# Patient Record
Sex: Female | Born: 1965 | Race: White | Hispanic: No | Marital: Single | Smoking: Never smoker
Health system: Southern US, Community
[De-identification: ages and names within clinical notes are randomized; demographics above are authoritative.]

## PROBLEM LIST (undated history)

## (undated) DIAGNOSIS — E88819 Insulin resistance, unspecified: Secondary | ICD-10-CM

## (undated) DIAGNOSIS — K635 Polyp of colon: Secondary | ICD-10-CM

## (undated) DIAGNOSIS — S83249A Other tear of medial meniscus, current injury, unspecified knee, initial encounter: Secondary | ICD-10-CM

## (undated) DIAGNOSIS — E785 Hyperlipidemia, unspecified: Secondary | ICD-10-CM

## (undated) DIAGNOSIS — Z923 Personal history of irradiation: Secondary | ICD-10-CM

## (undated) DIAGNOSIS — R011 Cardiac murmur, unspecified: Secondary | ICD-10-CM

## (undated) DIAGNOSIS — Z803 Family history of malignant neoplasm of breast: Secondary | ICD-10-CM

## (undated) DIAGNOSIS — K219 Gastro-esophageal reflux disease without esophagitis: Secondary | ICD-10-CM

## (undated) DIAGNOSIS — E669 Obesity, unspecified: Secondary | ICD-10-CM

## (undated) DIAGNOSIS — F329 Major depressive disorder, single episode, unspecified: Secondary | ICD-10-CM

## (undated) DIAGNOSIS — M5137 Other intervertebral disc degeneration, lumbosacral region: Secondary | ICD-10-CM

## (undated) DIAGNOSIS — J309 Allergic rhinitis, unspecified: Secondary | ICD-10-CM

## (undated) DIAGNOSIS — E8881 Metabolic syndrome: Secondary | ICD-10-CM

## (undated) DIAGNOSIS — M51379 Other intervertebral disc degeneration, lumbosacral region without mention of lumbar back pain or lower extremity pain: Secondary | ICD-10-CM

## (undated) DIAGNOSIS — K649 Unspecified hemorrhoids: Secondary | ICD-10-CM

## (undated) DIAGNOSIS — K760 Fatty (change of) liver, not elsewhere classified: Secondary | ICD-10-CM

## (undated) DIAGNOSIS — G4733 Obstructive sleep apnea (adult) (pediatric): Secondary | ICD-10-CM

## (undated) DIAGNOSIS — F419 Anxiety disorder, unspecified: Secondary | ICD-10-CM

## (undated) DIAGNOSIS — D649 Anemia, unspecified: Secondary | ICD-10-CM

## (undated) DIAGNOSIS — I10 Essential (primary) hypertension: Secondary | ICD-10-CM

## (undated) HISTORY — DX: Polyp of colon: K63.5

## (undated) HISTORY — DX: Obstructive sleep apnea (adult) (pediatric): G47.33

## (undated) HISTORY — DX: Metabolic syndrome: E88.81

## (undated) HISTORY — DX: Obesity, unspecified: E66.9

## (undated) HISTORY — DX: Major depressive disorder, single episode, unspecified: F32.9

## (undated) HISTORY — PX: KNEE SURGERY: SHX244

## (undated) HISTORY — DX: Other tear of medial meniscus, current injury, unspecified knee, initial encounter: S83.249A

## (undated) HISTORY — DX: Hyperlipidemia, unspecified: E78.5

## (undated) HISTORY — DX: Anxiety disorder, unspecified: F41.9

## (undated) HISTORY — PX: LAPAROSCOPY: SHX197

## (undated) HISTORY — DX: Cardiac murmur, unspecified: R01.1

## (undated) HISTORY — DX: Insulin resistance, unspecified: E88.819

## (undated) HISTORY — DX: Essential (primary) hypertension: I10

## (undated) HISTORY — DX: Gastro-esophageal reflux disease without esophagitis: K21.9

## (undated) HISTORY — DX: Family history of malignant neoplasm of breast: Z80.3

## (undated) HISTORY — DX: Unspecified hemorrhoids: K64.9

## (undated) HISTORY — PX: OTHER SURGICAL HISTORY: SHX169

## (undated) HISTORY — DX: Allergic rhinitis, unspecified: J30.9

---

## 1984-06-29 HISTORY — PX: CHOLECYSTECTOMY: SHX55

## 1998-06-29 HISTORY — PX: EYE SURGERY: SHX253

## 1998-06-29 HISTORY — PX: LASIK: SHX215

## 1998-07-26 ENCOUNTER — Other Ambulatory Visit: Admission: RE | Admit: 1998-07-26 | Discharge: 1998-07-26 | Payer: Self-pay | Admitting: Obstetrics and Gynecology

## 1998-10-11 ENCOUNTER — Other Ambulatory Visit: Admission: RE | Admit: 1998-10-11 | Discharge: 1998-10-11 | Payer: Self-pay | Admitting: Obstetrics and Gynecology

## 1999-02-24 ENCOUNTER — Other Ambulatory Visit: Admission: RE | Admit: 1999-02-24 | Discharge: 1999-02-24 | Payer: Self-pay | Admitting: Obstetrics and Gynecology

## 1999-04-08 ENCOUNTER — Ambulatory Visit (HOSPITAL_COMMUNITY): Admission: RE | Admit: 1999-04-08 | Discharge: 1999-04-08 | Payer: Self-pay | Admitting: Gastroenterology

## 1999-08-12 ENCOUNTER — Other Ambulatory Visit: Admission: RE | Admit: 1999-08-12 | Discharge: 1999-08-12 | Payer: Self-pay | Admitting: Obstetrics and Gynecology

## 1999-09-08 ENCOUNTER — Encounter: Admission: RE | Admit: 1999-09-08 | Discharge: 1999-09-08 | Payer: Self-pay | Admitting: Thoracic Surgery

## 1999-09-08 ENCOUNTER — Encounter: Payer: Self-pay | Admitting: Thoracic Surgery

## 1999-12-16 ENCOUNTER — Ambulatory Visit (HOSPITAL_COMMUNITY): Admission: RE | Admit: 1999-12-16 | Discharge: 1999-12-16 | Payer: Self-pay | Admitting: Obstetrics and Gynecology

## 2000-09-02 ENCOUNTER — Other Ambulatory Visit: Admission: RE | Admit: 2000-09-02 | Discharge: 2000-09-02 | Payer: Self-pay | Admitting: Obstetrics and Gynecology

## 2001-06-29 HISTORY — PX: FINGER GANGLION CYST EXCISION: SHX1636

## 2001-09-06 ENCOUNTER — Other Ambulatory Visit: Admission: RE | Admit: 2001-09-06 | Discharge: 2001-09-06 | Payer: Self-pay | Admitting: Obstetrics and Gynecology

## 2002-09-14 ENCOUNTER — Other Ambulatory Visit: Admission: RE | Admit: 2002-09-14 | Discharge: 2002-09-14 | Payer: Self-pay | Admitting: Obstetrics and Gynecology

## 2002-12-12 ENCOUNTER — Encounter: Payer: Self-pay | Admitting: Obstetrics and Gynecology

## 2002-12-12 ENCOUNTER — Ambulatory Visit (HOSPITAL_COMMUNITY): Admission: RE | Admit: 2002-12-12 | Discharge: 2002-12-12 | Payer: Self-pay | Admitting: Obstetrics and Gynecology

## 2003-11-07 ENCOUNTER — Other Ambulatory Visit: Admission: RE | Admit: 2003-11-07 | Discharge: 2003-11-07 | Payer: Self-pay | Admitting: Obstetrics and Gynecology

## 2004-06-04 ENCOUNTER — Ambulatory Visit: Payer: Self-pay | Admitting: Critical Care Medicine

## 2004-06-24 ENCOUNTER — Encounter: Admission: RE | Admit: 2004-06-24 | Discharge: 2004-06-24 | Payer: Self-pay | Admitting: Gastroenterology

## 2004-06-29 DIAGNOSIS — F419 Anxiety disorder, unspecified: Secondary | ICD-10-CM

## 2004-06-29 DIAGNOSIS — F32A Depression, unspecified: Secondary | ICD-10-CM

## 2004-06-29 HISTORY — DX: Depression, unspecified: F32.A

## 2004-06-29 HISTORY — DX: Anxiety disorder, unspecified: F41.9

## 2004-10-17 ENCOUNTER — Encounter: Admission: RE | Admit: 2004-10-17 | Discharge: 2004-10-17 | Payer: Self-pay | Admitting: Family Medicine

## 2005-01-14 ENCOUNTER — Ambulatory Visit: Payer: Self-pay | Admitting: Critical Care Medicine

## 2005-01-22 ENCOUNTER — Ambulatory Visit: Payer: Self-pay | Admitting: Internal Medicine

## 2005-02-11 ENCOUNTER — Other Ambulatory Visit: Admission: RE | Admit: 2005-02-11 | Discharge: 2005-02-11 | Payer: Self-pay | Admitting: Obstetrics and Gynecology

## 2005-02-19 ENCOUNTER — Ambulatory Visit (HOSPITAL_COMMUNITY): Admission: RE | Admit: 2005-02-19 | Discharge: 2005-02-19 | Payer: Self-pay | Admitting: Obstetrics and Gynecology

## 2005-06-29 DIAGNOSIS — K635 Polyp of colon: Secondary | ICD-10-CM

## 2005-06-29 HISTORY — DX: Polyp of colon: K63.5

## 2005-09-03 ENCOUNTER — Ambulatory Visit: Payer: Self-pay | Admitting: Critical Care Medicine

## 2005-09-18 ENCOUNTER — Ambulatory Visit: Payer: Self-pay | Admitting: Critical Care Medicine

## 2006-02-15 ENCOUNTER — Other Ambulatory Visit: Admission: RE | Admit: 2006-02-15 | Discharge: 2006-02-15 | Payer: Self-pay | Admitting: Obstetrics and Gynecology

## 2006-03-08 ENCOUNTER — Ambulatory Visit (HOSPITAL_COMMUNITY): Admission: RE | Admit: 2006-03-08 | Discharge: 2006-03-08 | Payer: Self-pay | Admitting: Obstetrics and Gynecology

## 2006-03-16 ENCOUNTER — Ambulatory Visit: Payer: Self-pay | Admitting: Pulmonary Disease

## 2006-04-23 ENCOUNTER — Ambulatory Visit (HOSPITAL_BASED_OUTPATIENT_CLINIC_OR_DEPARTMENT_OTHER): Admission: RE | Admit: 2006-04-23 | Discharge: 2006-04-23 | Payer: Self-pay | Admitting: Pulmonary Disease

## 2006-05-06 ENCOUNTER — Ambulatory Visit: Payer: Self-pay | Admitting: Pulmonary Disease

## 2006-06-02 ENCOUNTER — Ambulatory Visit: Payer: Self-pay | Admitting: Pulmonary Disease

## 2006-07-08 ENCOUNTER — Ambulatory Visit: Payer: Self-pay | Admitting: Pulmonary Disease

## 2006-12-30 ENCOUNTER — Ambulatory Visit: Payer: Self-pay | Admitting: Critical Care Medicine

## 2007-03-21 ENCOUNTER — Ambulatory Visit (HOSPITAL_COMMUNITY): Admission: RE | Admit: 2007-03-21 | Discharge: 2007-03-21 | Payer: Self-pay | Admitting: Obstetrics and Gynecology

## 2008-03-26 ENCOUNTER — Ambulatory Visit (HOSPITAL_COMMUNITY): Admission: RE | Admit: 2008-03-26 | Discharge: 2008-03-26 | Payer: Self-pay | Admitting: Obstetrics and Gynecology

## 2009-04-05 ENCOUNTER — Ambulatory Visit (HOSPITAL_COMMUNITY): Admission: RE | Admit: 2009-04-05 | Discharge: 2009-04-05 | Payer: Self-pay | Admitting: Obstetrics and Gynecology

## 2009-11-13 ENCOUNTER — Ambulatory Visit: Payer: Self-pay | Admitting: Critical Care Medicine

## 2009-11-13 DIAGNOSIS — I10 Essential (primary) hypertension: Secondary | ICD-10-CM | POA: Insufficient documentation

## 2009-11-13 DIAGNOSIS — J4520 Mild intermittent asthma, uncomplicated: Secondary | ICD-10-CM | POA: Insufficient documentation

## 2009-11-13 DIAGNOSIS — N809 Endometriosis, unspecified: Secondary | ICD-10-CM | POA: Insufficient documentation

## 2009-11-13 DIAGNOSIS — E785 Hyperlipidemia, unspecified: Secondary | ICD-10-CM | POA: Insufficient documentation

## 2009-11-13 DIAGNOSIS — F411 Generalized anxiety disorder: Secondary | ICD-10-CM | POA: Insufficient documentation

## 2009-11-13 DIAGNOSIS — J4540 Moderate persistent asthma, uncomplicated: Secondary | ICD-10-CM | POA: Insufficient documentation

## 2009-11-13 DIAGNOSIS — G4733 Obstructive sleep apnea (adult) (pediatric): Secondary | ICD-10-CM | POA: Insufficient documentation

## 2009-11-13 DIAGNOSIS — K219 Gastro-esophageal reflux disease without esophagitis: Secondary | ICD-10-CM | POA: Insufficient documentation

## 2009-11-13 DIAGNOSIS — E8881 Metabolic syndrome: Secondary | ICD-10-CM | POA: Insufficient documentation

## 2010-03-17 ENCOUNTER — Ambulatory Visit: Payer: Self-pay | Admitting: Critical Care Medicine

## 2010-03-17 DIAGNOSIS — Z6841 Body Mass Index (BMI) 40.0 and over, adult: Secondary | ICD-10-CM

## 2010-04-23 ENCOUNTER — Ambulatory Visit (HOSPITAL_COMMUNITY): Admission: RE | Admit: 2010-04-23 | Discharge: 2010-04-23 | Payer: Self-pay | Admitting: Obstetrics and Gynecology

## 2010-07-31 NOTE — Assessment & Plan Note (Signed)
Summary: Pulmonary OV   Primary Provider/Referring Provider:  Rita Ohara  CC:  4 month follow up.  Pt states she does have SOB when exercising -- this is slightly better since last OV and believes the increase in advair is helping.  Dry cough.  Denies wheezing and chest tightness.  Requesting flu vac today.Marland Kitchen  History of Present Illness: Pulmonary OV   March 17, 2010 3:14 PM Since last ov doing well other than continued weight gain.  Pt notes no wheeze and dyspnea Pt notes no chest pain .  No rescue inhaler use Pt denies any significant sore throat, nasal congestion or excess secretions, fever, chills, sweats, unintended weight loss, pleurtic or exertional chest pain, orthopnea PND, or leg swelling Pt denies any increase in rescue therapy over baseline, denies waking up needing it or having any early am or nocturnal exacerbations of coughing/wheezing/or dyspnea.   Asthma History    Asthma Control Assessment:    Age range: 12+ years    Symptoms: 0-2 days/week    Nighttime Awakenings: 0-2/month    Interferes w/ normal activity: no limitations    SABA use (not for EIB): 0-2 days/week    ATAQ questionnaire: 0    FEV1: 2.46 liters (today)    FEV1 Pred: 2.90 liters (today)    Exacerbations requiring oral systemic steroids: 0-1/year    Asthma Control Assessment: Well Controlled   Preventive Screening-Counseling & Management  Alcohol-Tobacco     Smoking Status: never  Current Medications (verified): 1)  Ibuprofen 200 Mg Tabs (Ibuprofen) .... Once Daily 2)  Zyrtec Allergy 10 Mg Tabs (Cetirizine Hcl) .... Take 1 Tablet By Mouth Once A Day 3)  Provera 10 Mg Tabs (Medroxyprogesterone Acetate) .... 2 Tabs Once Daily 4)  Singulair 10 Mg Tabs (Montelukast Sodium) .... Take 1 Tablet By Mouth Once A Day 5)  Flonase 50 Mcg/act Susp (Fluticasone Propionate) .... Two Times A Day 6)  Advair Diskus 250-50 Mcg/dose  Misc (Fluticasone-Salmeterol) .... One Puff Twice Daily 7)  Proventil Hfa 108  (90 Base) Mcg/act Aers (Albuterol Sulfate) .... As Needed 8)  Metoprolol Tartrate 50 Mg Tabs (Metoprolol Tartrate) .... Take 1 Tablet By Mouth Two Times A Day 9)  Hydrochlorothiazide 25 Mg Tabs (Hydrochlorothiazide) .... Take 1 Tablet By Mouth Once A Day 10)  Pravastatin Sodium 80 Mg Tabs (Pravastatin Sodium) .... Take 1 Tablet By Mouth Once A Day 11)  Citalopram Hydrobromide 40 Mg Tabs (Citalopram Hydrobromide) .... Take 1 Tablet By Mouth Once A Day 12)  Metformin Hcl 500 Mg Tabs (Metformin Hcl) .... Take 2 Tablets Qam and 1 Tablet Qpm. 13)  Multivitamins  Tabs (Multiple Vitamin) .... Take 1 Tablet By Mouth Once A Day 14)  Vitamin B-12 100 Mcg Tabs (Cyanocobalamin) .... Take 1 Tablet By Mouth Once A Day 15)  Calcium-Vitamin D 250-125 Mg-Unit Tabs (Calcium Carbonate-Vitamin D) .... 2 Tabs Once Daily 16)  Fish Oil 1000 Mg Caps (Omega-3 Fatty Acids) .... 3 Once Daily 17)  Prilosec 20 Mg Cpdr (Omeprazole) .... Take 1 Tablet By Mouth Once A Day 18)  Diclofenac Potassium 50 Mg Tabs (Diclofenac Potassium) .... As Needed  Allergies (verified): No Known Drug Allergies  Past History:  Past medical, surgical, family and social histories (including risk factors) reviewed, and no changes noted (except as noted below).  Past Medical History: Reviewed history from 11/13/2009 and no changes required. Current Problems:  GERD (ICD-530.81) INSULIN RESISTANCE SYNDROME (ICD-259.8) ANXIETY (ICD-300.00) HYPERLIPIDEMIA (ICD-272.4) HYPERTENSION (ICD-401.9) ASTHMA (ICD-493.90) ENDOMETRIOSIS (ICD-617.9) Obesity Sleep Apnea  Past Surgical History: Reviewed history from 11/13/2009 and no changes required. right and left knee surgery right hand surgery  Family History: Reviewed history from 11/13/2009 and no changes required. allergies-father, siblings asthma-father HTN-mother, father  Social History: Reviewed history from 11/13/2009 and no changes required. Patient never smoked.  alcohol 2 x  weekly single no children Medical Transcription  Review of Systems  The patient denies shortness of breath with activity, shortness of breath at rest, productive cough, non-productive cough, coughing up blood, chest pain, irregular heartbeats, acid heartburn, indigestion, loss of appetite, weight change, abdominal pain, difficulty swallowing, sore throat, tooth/dental problems, headaches, nasal congestion/difficulty breathing through nose, sneezing, itching, ear ache, anxiety, depression, hand/feet swelling, joint stiffness or pain, rash, change in color of mucus, and fever.    Vital Signs:  Patient profile:   45 year old female Height:      64 inches Weight:      275.50 pounds BMI:     47.46 O2 Sat:      96 % on Room air Temp:     98.1 degrees F oral Pulse rate:   75 / minute BP sitting:   132 / 68  (left arm) Cuff size:   large  Vitals Entered By: Raymondo Band RN (March 17, 2010 3:06 PM)  O2 Flow:  Room air CC: 4 month follow up.  Pt states she does have SOB when exercising -- this is slightly better since last OV and believes the increase in advair is helping.  Dry cough.  Denies wheezing and chest tightness.  Requesting flu vac today. Comments Medications reviewed with patient Daytime contact number verified with patient. Raymondo Band RN  March 17, 2010 3:07 PM    Physical Exam  Additional Exam:  Gen: Pleasant, obese , in no distress,  normal affect ENT: No lesions,  mouth clear,  oropharynx clear, no postnasal drip Neck: No JVD, no TMG, no carotid bruits Lungs: No use of accessory muscles, no dullness to percussion, clear without rales or rhonchi, distant BS Cardiovascular: RRR, heart sounds normal, no murmur or gallops, no peripheral edema Abdomen: soft and NT, no HSM,  BS normal Musculoskeletal: No deformities, no cyanosis or clubbing Neuro: alert, non focal Skin: Warm, no lesions or rashes    Pre-Spirometry FEV1    Value: 2.46 L     Pred: 2.90 L       Impression & Recommendations:  Problem # 1:  OBESITY, UNSPECIFIED (ICD-278.00) Assessment Deteriorated ongoing weight gain plan weight loss program recommended  Problem # 2:  ASTHMA (ICD-493.90) Assessment: Unchanged  Moderate persistent asthma stable at this time plan No change in inhaled medications.   Maintain treatment program as currently prescribed. flu vaccine   Medications Added to Medication List This Visit: 1)  Zyrtec Allergy 10 Mg Tabs (Cetirizine hcl) .... Take 1 tablet by mouth once a day 2)  Fish Oil 1000 Mg Caps (Omega-3 fatty acids) .... 3 once daily 3)  Diclofenac Potassium 50 Mg Tabs (Diclofenac potassium) .... As needed  Complete Medication List: 1)  Ibuprofen 200 Mg Tabs (Ibuprofen) .... Once daily 2)  Zyrtec Allergy 10 Mg Tabs (Cetirizine hcl) .... Take 1 tablet by mouth once a day 3)  Provera 10 Mg Tabs (Medroxyprogesterone acetate) .... 2 tabs once daily 4)  Singulair 10 Mg Tabs (Montelukast sodium) .... Take 1 tablet by mouth once a day 5)  Flonase 50 Mcg/act Susp (Fluticasone propionate) .... Two times a day 6)  Advair Diskus 250-50 Mcg/dose Misc (Fluticasone-salmeterol) .Marland KitchenMarland KitchenMarland Kitchen  One puff twice daily 7)  Proventil Hfa 108 (90 Base) Mcg/act Aers (Albuterol sulfate) .... As needed 8)  Metoprolol Tartrate 50 Mg Tabs (Metoprolol tartrate) .... Take 1 tablet by mouth two times a day 9)  Hydrochlorothiazide 25 Mg Tabs (Hydrochlorothiazide) .... Take 1 tablet by mouth once a day 10)  Pravastatin Sodium 80 Mg Tabs (Pravastatin sodium) .... Take 1 tablet by mouth once a day 11)  Citalopram Hydrobromide 40 Mg Tabs (Citalopram hydrobromide) .... Take 1 tablet by mouth once a day 12)  Metformin Hcl 500 Mg Tabs (Metformin hcl) .... Take 2 tablets qam and 1 tablet qpm. 13)  Multivitamins Tabs (Multiple vitamin) .... Take 1 tablet by mouth once a day 14)  Vitamin B-12 100 Mcg Tabs (Cyanocobalamin) .... Take 1 tablet by mouth once a day 15)  Calcium-vitamin D 250-125  Mg-unit Tabs (Calcium carbonate-vitamin d) .... 2 tabs once daily 16)  Fish Oil 1000 Mg Caps (Omega-3 fatty acids) .... 3 once daily 17)  Prilosec 20 Mg Cpdr (Omeprazole) .... Take 1 tablet by mouth once a day 18)  Diclofenac Potassium 50 Mg Tabs (Diclofenac potassium) .... As needed  Other Orders: Est. Patient Level III (56387) Admin 1st Vaccine (56433) Flu Vaccine 31yr + ((29518  Patient Instructions: 1)  A Flu Vaccine will be given 2)  No change in medications 3)  Focus on weight loss 4)  Return 4-5 months     Flu Vaccine Consent Questions     Do you have a history of severe allergic reactions to this vaccine? no    Any prior history of allergic reactions to egg and/or gelatin? no    Do you have a sensitivity to the preservative Thimersol? no    Do you have a past history of Guillan-Barre Syndrome? no    Do you currently have an acute febrile illness? no    Have you ever had a severe reaction to latex? no    Vaccine information given and explained to patient? yes    Are you currently pregnant? no    Lot Number:[id number]   Exp Date:12/26/2009   Site Given  Left Deltoid IMlu CRaymondo BandRN  March 17, 2010 3:26 PM  Appended Document: Pulmonary OV fax eve knapp

## 2010-07-31 NOTE — Assessment & Plan Note (Signed)
Summary: Pulmonary OV   Primary Provider/Referring Provider:  Rita Ohara  CC:  Follow up.  Last seen 12/2006.  Pt states she is having increased SOB with activity x6 months.  Denies wheezing, chest tightness, and cough.  .  History of Present Illness: Pulmonary OV  Nov 13, 2009 2:09 PM Not seen since 2008.  Pt with hx of moderate persistent asthma.  Pt has gained weight.  Eating poorly.  Now if tries to run or high cardiac activity is more dyspneic. Notes wheeze with every exercise effort. Notes no cough.   Pt is still on cpap for osa. No chest pain.    Main issue is inability to excercise.  Maintains advair 100 one puff two times a day Pt denies any significant sore throat, nasal congestion or excess secretions, fever, chills, sweats, unintended weight loss, pleurtic or exertional chest pain, orthopnea PND, or leg swelling Pt denies any increase in rescue therapy over baseline, denies waking up needing it or having any early am or nocturnal exacerbations of coughing/wheezing/or dyspnea. Pt also denies any obvious fluctuation in symptoms wiht weather or environmental change or other alleviating or aggravating factors     Asthma History    Initial Asthma Severity Rating:    Age range: 12+ years    Symptoms: >2 days/week; not daily    Nighttime Awakenings: 0-2/month    Interferes w/ normal activity: some limitations    SABA use (not for EIB): 0-2 days/week    Exacerbations requiring oral systemic steroids: 0-1/year    Asthma Severity Assessment: Moderate Persistent   Preventive Screening-Counseling & Management  Alcohol-Tobacco     Smoking Status: never  Current Medications (verified): 1)  Ibuprofen 200 Mg Tabs (Ibuprofen) .... Once Daily 2)  Allegra 180 Mg Tabs (Fexofenadine Hcl) .... Once Daily 3)  Provera 10 Mg Tabs (Medroxyprogesterone Acetate) .... 2 Tabs Once Daily 4)  Singulair 10 Mg Tabs (Montelukast Sodium) .... Take 1 Tablet By Mouth Once A Day 5)  Flonase 50  Mcg/act Susp (Fluticasone Propionate) .... Two Times A Day 6)  Advair Diskus 100-50 Mcg/dose Aepb (Fluticasone-Salmeterol) .... Inhale 1 Puff Two Times A Day 7)  Proventil Hfa 108 (90 Base) Mcg/act Aers (Albuterol Sulfate) .... As Needed 8)  Metoprolol Tartrate 50 Mg Tabs (Metoprolol Tartrate) .... Take 1 Tablet By Mouth Two Times A Day 9)  Hydrochlorothiazide 25 Mg Tabs (Hydrochlorothiazide) .... Take 1 Tablet By Mouth Once A Day 10)  Pravastatin Sodium 80 Mg Tabs (Pravastatin Sodium) .... Take 1 Tablet By Mouth Once A Day 11)  Citalopram Hydrobromide 40 Mg Tabs (Citalopram Hydrobromide) .... Take 1 Tablet By Mouth Once A Day 12)  Metformin Hcl 500 Mg Tabs (Metformin Hcl) .... Take 2 Tablets Qam and 1 Tablet Qpm. 13)  Multivitamins  Tabs (Multiple Vitamin) .... Take 1 Tablet By Mouth Once A Day 14)  Vitamin B-12 100 Mcg Tabs (Cyanocobalamin) .... Take 1 Tablet By Mouth Once A Day 15)  Calcium-Vitamin D 250-125 Mg-Unit Tabs (Calcium Carbonate-Vitamin D) .... 2 Tabs Once Daily 16)  Fish Oil 1200 Mg Caps (Omega-3 Fatty Acids) .... 2 Caps Once Daily 17)  Prilosec 20 Mg Cpdr (Omeprazole) .... Take 1 Tablet By Mouth Once A Day  Allergies (verified): No Known Drug Allergies  Past History:  Past medical, surgical, family and social histories (including risk factors) reviewed, and no changes noted (except as noted below).  Past Medical History: Current Problems:  GERD (ICD-530.81) INSULIN RESISTANCE SYNDROME (ICD-259.8) ANXIETY (ICD-300.00) HYPERLIPIDEMIA (ICD-272.4) HYPERTENSION (ICD-401.9) ASTHMA (  ICD-493.90) ENDOMETRIOSIS (ICD-617.9) Obesity Sleep Apnea  Past Surgical History: right and left knee surgery right hand surgery  Family History: Reviewed history and no changes required. allergies-father, siblings asthma-father HTN-mother, father  Social History: Reviewed history and no changes required. Patient never smoked.  alcohol 2 x weekly single no children Medical  Transcription Smoking Status:  never  Review of Systems       The patient complains of shortness of breath with activity, non-productive cough, and weight change.  The patient denies shortness of breath at rest, productive cough, coughing up blood, chest pain, irregular heartbeats, acid heartburn, indigestion, loss of appetite, abdominal pain, difficulty swallowing, sore throat, tooth/dental problems, headaches, nasal congestion/difficulty breathing through nose, sneezing, itching, ear ache, anxiety, depression, hand/feet swelling, joint stiffness or pain, rash, change in color of mucus, and fever.    Vital Signs:  Patient profile:   45 year old female Height:      64 inches Weight:      264 pounds BMI:     45.48 O2 Sat:      97 % on Room air Temp:     98.7 degrees F oral Pulse rate:   66 / minute BP sitting:   130 / 76  (right arm) Cuff size:   large  Vitals Entered By: Raymondo Band RN (Nov 13, 2009 2:04 PM)  O2 Flow:  Room air CC: Follow up.  Last seen 12/2006.  Pt states she is having increased SOB with activity x6 months.  Denies wheezing, chest tightness, cough.   Comments Medications reviewed with patient Daytime contact number verified with patient. Raymondo Band RN  Nov 13, 2009 2:04 PM    Physical Exam  Additional Exam:  Gen: Pleasant, obese , in no distress,  normal affect ENT: No lesions,  mouth clear,  oropharynx clear, no postnasal drip Neck: No JVD, no TMG, no carotid bruits Lungs: No use of accessory muscles, no dullness to percussion, clear without rales or rhonchi, distant BS Cardiovascular: RRR, heart sounds normal, no murmur or gallops, no peripheral edema Abdomen: soft and NT, no HSM,  BS normal Musculoskeletal: No deformities, no cyanosis or clubbing Neuro: alert, non focal Skin: Warm, no lesions or rashes    Pulmonary Function Test Date: 11/13/2009 2:25 PM Gender: Female  Pre-Spirometry FVC    Value: 3.60 L/min   % Pred: 99.90 % FEV1    Value:  2.46 L     Pred: 2.90 L     % Pred: 84.90 % FEV1/FVC  Value: 68.38 %     % Pred: 84.20 %  Impression & Recommendations:  Problem # 1:  ASTHMA (ICD-493.90) Assessment Deteriorated Moderate persistent asthma with mild flare, spirometry with peripheral airflow obstruction noted. Obesity is an issue  plan increase advair to 250 one puff two times a day weight loss program as needed SABA  Medications Added to Medication List This Visit: 1)  Ibuprofen 200 Mg Tabs (Ibuprofen) .... Once daily 2)  Allegra 180 Mg Tabs (Fexofenadine hcl) .... Once daily 3)  Provera 10 Mg Tabs (Medroxyprogesterone acetate) .... 2 tabs once daily 4)  Singulair 10 Mg Tabs (Montelukast sodium) .... Take 1 tablet by mouth once a day 5)  Flonase 50 Mcg/act Susp (Fluticasone propionate) .... Two times a day 6)  Advair Diskus 100-50 Mcg/dose Aepb (Fluticasone-salmeterol) .... Inhale 1 puff two times a day 7)  Advair Diskus 250-50 Mcg/dose Misc (Fluticasone-salmeterol) .... One puff twice daily 8)  Proventil Hfa 108 (90 Base) Mcg/act Aers (Albuterol  sulfate) .... As needed 9)  Metoprolol Tartrate 50 Mg Tabs (Metoprolol tartrate) .... Take 1 tablet by mouth two times a day 10)  Hydrochlorothiazide 25 Mg Tabs (Hydrochlorothiazide) .... Take 1 tablet by mouth once a day 11)  Pravastatin Sodium 80 Mg Tabs (Pravastatin sodium) .... Take 1 tablet by mouth once a day 12)  Citalopram Hydrobromide 40 Mg Tabs (Citalopram hydrobromide) .... Take 1 tablet by mouth once a day 13)  Metformin Hcl 500 Mg Tabs (Metformin hcl) .... Take 2 tablets qam and 1 tablet qpm. 14)  Multivitamins Tabs (Multiple vitamin) .... Take 1 tablet by mouth once a day 15)  Vitamin B-12 100 Mcg Tabs (Cyanocobalamin) .... Take 1 tablet by mouth once a day 16)  Calcium-vitamin D 250-125 Mg-unit Tabs (Calcium carbonate-vitamin d) .... 2 tabs once daily 17)  Fish Oil 1200 Mg Caps (Omega-3 fatty acids) .... 2 caps once daily 18)  Prilosec 20 Mg Cpdr (Omeprazole)  .... Take 1 tablet by mouth once a day  Complete Medication List: 1)  Ibuprofen 200 Mg Tabs (Ibuprofen) .... Once daily 2)  Allegra 180 Mg Tabs (Fexofenadine hcl) .... Once daily 3)  Provera 10 Mg Tabs (Medroxyprogesterone acetate) .... 2 tabs once daily 4)  Singulair 10 Mg Tabs (Montelukast sodium) .... Take 1 tablet by mouth once a day 5)  Flonase 50 Mcg/act Susp (Fluticasone propionate) .... Two times a day 6)  Advair Diskus 250-50 Mcg/dose Misc (Fluticasone-salmeterol) .... One puff twice daily 7)  Proventil Hfa 108 (90 Base) Mcg/act Aers (Albuterol sulfate) .... As needed 8)  Metoprolol Tartrate 50 Mg Tabs (Metoprolol tartrate) .... Take 1 tablet by mouth two times a day 9)  Hydrochlorothiazide 25 Mg Tabs (Hydrochlorothiazide) .... Take 1 tablet by mouth once a day 10)  Pravastatin Sodium 80 Mg Tabs (Pravastatin sodium) .... Take 1 tablet by mouth once a day 11)  Citalopram Hydrobromide 40 Mg Tabs (Citalopram hydrobromide) .... Take 1 tablet by mouth once a day 12)  Metformin Hcl 500 Mg Tabs (Metformin hcl) .... Take 2 tablets qam and 1 tablet qpm. 13)  Multivitamins Tabs (Multiple vitamin) .... Take 1 tablet by mouth once a day 14)  Vitamin B-12 100 Mcg Tabs (Cyanocobalamin) .... Take 1 tablet by mouth once a day 15)  Calcium-vitamin D 250-125 Mg-unit Tabs (Calcium carbonate-vitamin d) .... 2 tabs once daily 16)  Fish Oil 1200 Mg Caps (Omega-3 fatty acids) .... 2 caps once daily 17)  Prilosec 20 Mg Cpdr (Omeprazole) .... Take 1 tablet by mouth once a day  Other Orders: Spirometry w/Graph (94010) Est. Patient Level V (06269)  Patient Instructions: 1)  Increase advair to 250 two puff twice daily 2)  Follow weight loss program 3)  Use Proventil as needed and before exercise  4)  Return 4 months Prescriptions: ADVAIR DISKUS 250-50 MCG/DOSE  MISC (FLUTICASONE-SALMETEROL) One puff twice daily  #1 x 6   Entered and Authorized by:   Elsie Stain MD   Signed by:   Elsie Stain  MD on 11/13/2009   Method used:   Electronically to        [facility]. [phone]* (retail)       [address]       [place], [place]  [address]       Ph: [phone] or [phone]       Fax: [phone]   RxID:   [id number]    Immunization History:  Influenza Immunization History:    Influenza:  historical (03/29/2009)    CardioPerfect Spirometry  ID: [id number] Patient: Susan Gould, Susan Gould DOB: October 25, 1965 Age: 45 Years Old Sex: Female Race: White Height: 64 Weight: 264 Status: Confirmed Recorded: 11/13/2009 2:25 PM  Parameter  Measured Predicted %Predicted FVC     3.60        3.60        99.90 FEV1     2.46        2.90        84.90 FEV1%   68.38        81.25        84.20 PEF    5.78        6.84        84.40   Comments: Moderate peripheral airflow obstruction  Interpretation: Pre: FVC= 3.60L FEV1= 2.46L FEV1%= 68.4% 2.46/3.60 FEV1/FVC (11/13/2009 2:27:24 PM), Mild obstruction    Appended Document: Pulmonary OV fax eve knapp

## 2010-08-05 ENCOUNTER — Ambulatory Visit (INDEPENDENT_AMBULATORY_CARE_PROVIDER_SITE_OTHER): Payer: BC Managed Care – PPO | Admitting: Critical Care Medicine

## 2010-08-05 ENCOUNTER — Encounter: Payer: Self-pay | Admitting: Critical Care Medicine

## 2010-08-05 DIAGNOSIS — J45909 Unspecified asthma, uncomplicated: Secondary | ICD-10-CM

## 2010-08-05 DIAGNOSIS — E669 Obesity, unspecified: Secondary | ICD-10-CM

## 2010-08-14 NOTE — Assessment & Plan Note (Signed)
Summary: Pulmonary OV   Primary Provider/Referring Provider:  Rita Ohara  CC:  4 month follow up, pt c/o am cough non-prod, denies sob, and stopped singulair 4 months ago due to cost..  History of Present Illness: Pulmonary OV   March 17, 2010 3:14 PM Since last ov doing well other than continued weight gain.  Pt notes no wheeze and dyspnea Pt notes no chest pain .  No rescue inhaler use Pt denies any significant sore throat, nasal congestion or excess secretions, fever, chills, sweats, unintended weight loss, pleurtic or exertional chest pain, orthopnea PND, or leg swelling Pt denies any increase in rescue therapy over baseline, denies waking up needing it or having any early am or nocturnal exacerbations of coughing/wheezing/or dyspnea.   August 05, 2010 11:41 AM Since last ov,  no real change no new issues Pt denies any significant sore throat, nasal congestion or excess secretions, fever, chills, sweats, unintended weight loss, pleurtic or exertional chest pain, orthopnea PND, or leg swelling Pt denies any increase in rescue therapy over baseline, denies waking up needing it or having any early am or nocturnal exacerbations of coughing/wheezing/or dyspnea.   Asthma History    Asthma Control Assessment:    Age range: 12+ years    Symptoms: 0-2 days/week    Nighttime Awakenings: 0-2/month    Interferes w/ normal activity: no limitations    SABA use (not for EIB): 0-2 days/week    ATAQ questionnaire: 0    FEV1: 2.46 liters (today)    FEV1 Pred: 2.90 liters (today)    Exacerbations requiring oral systemic steroids: 0-1/year    Asthma Control Assessment: Well Controlled   Preventive Screening-Counseling & Management  Alcohol-Tobacco     Smoking Status: never  Current Medications (verified): 1)  Ibuprofen 200 Mg Tabs (Ibuprofen) .... Once Daily 2)  Zyrtec Allergy 10 Mg Tabs (Cetirizine Hcl) .... Take 1 Tablet By Mouth Once A Day 3)  Provera 10 Mg Tabs  (Medroxyprogesterone Acetate) .... 2 Tabs Once Daily 4)  Flonase 50 Mcg/act Susp (Fluticasone Propionate) .... Two Times A Day 5)  Advair Diskus 250-50 Mcg/dose  Misc (Fluticasone-Salmeterol) .... One Puff Twice Daily 6)  Proventil Hfa 108 (90 Base) Mcg/act Aers (Albuterol Sulfate) .... As Needed 7)  Metoprolol Tartrate 50 Mg Tabs (Metoprolol Tartrate) .... Take 1 Tablet By Mouth Two Times A Day 8)  Hydrochlorothiazide 25 Mg Tabs (Hydrochlorothiazide) .... Take 1 Tablet By Mouth Once A Day 9)  Pravastatin Sodium 80 Mg Tabs (Pravastatin Sodium) .... Take 1 Tablet By Mouth Once A Day 10)  Citalopram Hydrobromide 40 Mg Tabs (Citalopram Hydrobromide) .... Take 1 Tablet By Mouth Once A Day 11)  Metformin Hcl 500 Mg Tabs (Metformin Hcl) .... Take 2 Tablets Qam and 1 Tablet Qpm. 12)  Multivitamins  Tabs (Multiple Vitamin) .... Take 1 Tablet By Mouth Once A Day 13)  Vitamin B-12 100 Mcg Tabs (Cyanocobalamin) .... Take 1 Tablet By Mouth Once A Day 14)  Calcium-Vitamin D 250-125 Mg-Unit Tabs (Calcium Carbonate-Vitamin D) .... 2 Tabs Once Daily 15)  Fish Oil 1000 Mg Caps (Omega-3 Fatty Acids) .... 3 Once Daily 16)  Prilosec 20 Mg Cpdr (Omeprazole) .... Take 1 Tablet By Mouth Once A Day 17)  Diclofenac Potassium 50 Mg Tabs (Diclofenac Potassium) .... As Needed  Allergies (verified): No Known Drug Allergies  Past History:  Past medical, surgical, family and social histories (including risk factors) reviewed, and no changes noted (except as noted below).  Past Medical History: Reviewed  history from 11/13/2009 and no changes required. Current Problems:  GERD (ICD-530.81) INSULIN RESISTANCE SYNDROME (ICD-259.8) ANXIETY (ICD-300.00) HYPERLIPIDEMIA (ICD-272.4) HYPERTENSION (ICD-401.9) ASTHMA (ICD-493.90) ENDOMETRIOSIS (ICD-617.9) Obesity Sleep Apnea  Past Surgical History: Reviewed history from 11/13/2009 and no changes required. right and left knee surgery right hand surgery  Family  History: Reviewed history from 11/13/2009 and no changes required. allergies-father, siblings asthma-father HTN-mother, father  Social History: Reviewed history from 11/13/2009 and no changes required. Patient never smoked.  alcohol 2 x weekly single no children Medical Transcription  Review of Systems  The patient denies shortness of breath with activity, shortness of breath at rest, productive cough, non-productive cough, coughing up blood, chest pain, irregular heartbeats, acid heartburn, indigestion, loss of appetite, weight change, abdominal pain, difficulty swallowing, sore throat, tooth/dental problems, headaches, nasal congestion/difficulty breathing through nose, sneezing, itching, ear ache, anxiety, depression, hand/feet swelling, joint stiffness or pain, rash, change in color of mucus, and fever.    Vital Signs:  Patient profile:   45 year old female Height:      64 inches Weight:      264 pounds BMI:     45.48 O2 Sat:      97 % on Room air Temp:     98.9 degrees F oral Pulse rate:   62 / minute BP sitting:   114 / 70  (left arm) Cuff size:   large  Vitals Entered By: Stefani Dama RCP, LPN (August 05, 8113 11:30 AM)  Nutrition Counseling: Patient's BMI is greater than 25 and therefore counseled on weight management options.  O2 Flow:  Room air CC: 4 month follow up, pt c/o am cough non-prod, denies sob, stopped singulair 4 months ago due to cost. Comments Medications reviewed with patient Stefani Dama RCP, LPN  August 05, 7260 11:33 AM    Physical Exam  Additional Exam:  Gen: Pleasant, obese , in no distress,  normal affect ENT: No lesions,  mouth clear,  oropharynx clear, no postnasal drip Neck: No JVD, no TMG, no carotid bruits Lungs: No use of accessory muscles, no dullness to percussion, clear without rales or rhonchi, distant BS Cardiovascular: RRR, heart sounds normal, no murmur or gallops, no peripheral edema Abdomen: soft and NT, no HSM,  BS  normal Musculoskeletal: No deformities, no cyanosis or clubbing Neuro: alert, non focal Skin: Warm, no lesions or rashes    Pre-Spirometry FEV1    Value: 2.46 L     Pred: 2.90 L     Impression & Recommendations:  Problem # 1:  ASTHMA (ICD-493.90) Assessment Improved  Moderate persistent asthma stable at this time plan No change in inhaled medications.   Maintain treatment program as currently prescribed.  Medications Added to Medication List This Visit: 1)  Fluticasone Propionate 50 Mcg/act Susp (Fluticasone propionate) .... One -two puffs once daily  Complete Medication List: 1)  Ibuprofen 200 Mg Tabs (Ibuprofen) .... Once daily 2)  Zyrtec Allergy 10 Mg Tabs (Cetirizine hcl) .... Take 1 tablet by mouth once a day 3)  Provera 10 Mg Tabs (Medroxyprogesterone acetate) .... 2 tabs once daily 4)  Fluticasone Propionate 50 Mcg/act Susp (Fluticasone propionate) .... One -two puffs once daily 5)  Advair Diskus 250-50 Mcg/dose Misc (Fluticasone-salmeterol) .... One puff twice daily 6)  Proventil Hfa 108 (90 Base) Mcg/act Aers (Albuterol sulfate) .... As needed 7)  Metoprolol Tartrate 50 Mg Tabs (Metoprolol tartrate) .... Take 1 tablet by mouth two times a day 8)  Hydrochlorothiazide 25 Mg Tabs (Hydrochlorothiazide) .Marland KitchenMarland KitchenMarland Kitchen  Take 1 tablet by mouth once a day 9)  Pravastatin Sodium 80 Mg Tabs (Pravastatin sodium) .... Take 1 tablet by mouth once a day 10)  Citalopram Hydrobromide 40 Mg Tabs (Citalopram hydrobromide) .... Take 1 tablet by mouth once a day 11)  Metformin Hcl 500 Mg Tabs (Metformin hcl) .... Take 2 tablets qam and 1 tablet qpm. 12)  Multivitamins Tabs (Multiple vitamin) .... Take 1 tablet by mouth once a day 13)  Vitamin B-12 100 Mcg Tabs (Cyanocobalamin) .... Take 1 tablet by mouth once a day 14)  Calcium-vitamin D 250-125 Mg-unit Tabs (Calcium carbonate-vitamin d) .... 2 tabs once daily 15)  Fish Oil 1000 Mg Caps (Omega-3 fatty acids) .... 3 once daily 16)  Prilosec 20 Mg Cpdr  (Omeprazole) .... Take 1 tablet by mouth once a day 17)  Diclofenac Potassium 50 Mg Tabs (Diclofenac potassium) .... As needed  Other Orders: Est. Patient Level III (62831)  Patient Instructions: 1)  No change in medications 2)  Return in     5     months Prescriptions: FLUTICASONE PROPIONATE 50 MCG/ACT SUSP (FLUTICASONE PROPIONATE) one -two puffs once daily  #1 x 6   Entered and Authorized by:   Elsie Stain MD   Signed by:   Elsie Stain MD on 08/05/2010   Method used:   Electronically to        [facility]. [phone]* (retail)       [address]       [place], [place]  [address]       Ph: [phone] or [phone]       Fax: [phone]   RxID:   [id number] ADVAIR DISKUS 250-50 MCG/DOSE  MISC (FLUTICASONE-SALMETEROL) One puff twice daily  #1 x 6   Entered and Authorized by:   Elsie Stain MD   Signed by:   Elsie Stain MD on 08/05/2010   Method used:   Electronically to        [facility]. [phone]* (retail)       [address]       [place], [place]  [address]       Ph: [phone] or [phone]       Fax: [phone]   RxID:   [id number]

## 2010-08-21 ENCOUNTER — Ambulatory Visit (INDEPENDENT_AMBULATORY_CARE_PROVIDER_SITE_OTHER): Payer: BC Managed Care – PPO | Admitting: Family Medicine

## 2010-08-25 ENCOUNTER — Ambulatory Visit (INDEPENDENT_AMBULATORY_CARE_PROVIDER_SITE_OTHER): Payer: BC Managed Care – PPO

## 2010-08-25 DIAGNOSIS — Z79899 Other long term (current) drug therapy: Secondary | ICD-10-CM

## 2010-08-25 DIAGNOSIS — E119 Type 2 diabetes mellitus without complications: Secondary | ICD-10-CM

## 2010-08-25 DIAGNOSIS — E7800 Pure hypercholesterolemia, unspecified: Secondary | ICD-10-CM

## 2010-08-25 DIAGNOSIS — I10 Essential (primary) hypertension: Secondary | ICD-10-CM

## 2010-10-20 ENCOUNTER — Ambulatory Visit (INDEPENDENT_AMBULATORY_CARE_PROVIDER_SITE_OTHER): Payer: BC Managed Care – PPO | Admitting: Family Medicine

## 2010-10-20 DIAGNOSIS — I10 Essential (primary) hypertension: Secondary | ICD-10-CM

## 2010-10-20 DIAGNOSIS — E782 Mixed hyperlipidemia: Secondary | ICD-10-CM

## 2010-10-20 DIAGNOSIS — M7710 Lateral epicondylitis, unspecified elbow: Secondary | ICD-10-CM

## 2010-11-10 ENCOUNTER — Telehealth: Payer: Self-pay | Admitting: Family Medicine

## 2010-11-10 MED ORDER — METFORMIN HCL 500 MG PO TABS: 500.0000 mg | ORAL_TABLET | Freq: Two times a day (BID) | ORAL | Status: DC

## 2010-11-10 NOTE — Telephone Encounter (Signed)
Refilled metformin 550m #90 w/2rf's take 2qam and 1qpm to [facility]

## 2010-11-11 NOTE — Assessment & Plan Note (Signed)
[facility]                             PULMONARY OFFICE NOTE   QUETZALY, EBNER                   MRN:          [id number]  DATE:12/30/2006                            DOB:          [birth year]    REASON FOR VISIT:  The patient returns today for follow up.   HISTORY:  The patient is doing well with decreased shortness of breath  and decreased cough.  She now has been diagnosed with severe obstructive  sleep apnea and is on CPAP therapy.  She is maintained on Advair 100/51  spray twice a day, Flonase daily, singular 10 mg daily, and albuterol as  needed.   PHYSICAL EXAMINATION:  VITAL SIGNS:  On exam temp is 98, blood pressure  136/88, pulse 71 and saturation 98% on room air.  CHEST:  The chest shows distant breath sounds with no evidence of wheeze  or rhonchi.  HEART:  Cardiac exam shows a regular rate and rhythm without S3.  Normal  S1 and S2.  ABDOMEN:  The abdomen is soft and nontender.  EXTREMITIES:  The extremities show no edema, clubbing or venous disease.  NEUROLOGIC:  The neurological exam is intact.   IMPRESSION:  Stable extrinsic asthma.   PLAN:  1. The plan is to maintain her on Advair as currently dosed and as      needed albuterol.  2. We will see the patient back in five months.     Burnett Harry Joya Gaskins, MD, [facility]  Electronically Signed    PEW/MedQ  DD: 12/30/2006  DT: 12/31/2006  Job #: [id number]   cc:   Vikki Ports, M.D.

## 2010-11-14 NOTE — Procedures (Signed)
NAME:  ASIYAH, PINEAU NO.:  [id number]   MEDICAL RECORD NO.:  [id number]          PATIENT TYPE:  OUT   LOCATION:  SLEEP CENTER                 FACILITY:  [facility]   PHYSICIAN:  Kathee Delton, MD,FCCPDATE OF BIRTH:  May 27[birth year] 1964   DATE OF STUDY:  04/23/2006                              NOCTURNAL POLYSOMNOGRAM    REFERRING PHYSICIAN:  Dr. Danton Sewer   INDICATION FOR STUDY:  Hypersomnia with sleep apnea.   EPWORTH SLEEPINESS SCORE:  10   SLEEP ARCHITECTURE:  The patient had total sleep time 305 minutes with  decreased slow wave sleep as well as REM.  Sleep onset latency is prolonged  at 64 minutes as was REM onset at 213 minutes.  Sleep efficiency decreased  at 77%.   RESPIRATORY DATA:  The patient was found to have 128 hypotonias and 85  obstruction apneas for a respiratory disturbance index of 42 events per  hour.  The events were not positional and snoring was noted throughout but  not quantified by the sleep technician.   OXYGEN DATA:  There was oxygen desaturation as low as 89% with the patient's  obstructive events.   CARDIAC DATA:  No clinically significant cardiac arrhythmias.   MOVEMENT/PARASOMNIA:  The patient was found to have 53 leg jerks with less  than one per hour resulting in arousal or awakening.   IMPRESSION/RECOMMENDATIONS:  Severe obstructive sleep apnea/hypotonia  syndrome with a respiratory disturbance index of 42 events per hour and  oxygen saturation as low as 89%.  Treatment for this degree of sleep apnea  should focus primarily on weight loss as well as CPAP.           ______________________________  Kathee Delton, MD,FCCP  Diplomate, American Board of Sleep  Medicine     K[facility]/MEDQ  D:  05/04/2006 15:03:31  T:  05/05/2006 07:58:50  Job:  [id number]

## 2010-11-14 NOTE — Op Note (Signed)
[facility]  Patient:    Susan Gould, Susan Gould                   MRN: [id number] Proc. Date: 12/16/99 Adm. Date:  [id number] Attending:  Kendall Flack Pearline                           Operative Report  PREOPERATIVE DIAGNOSES:       1. Pelvic pain.                               2. Endometriosis.  POSTOPERATIVE DIAGNOSES:      1. Pelvic pain.                               2. Endometriosis.                               3. Right ovarian follicular cyst.  OPERATION:                    Operative laparoscopy, aspiration of right ovarian cyst, ablation of endometriosis.  ANESTHESIA:                   General orotracheal.  ESTIMATED BLOOD LOSS:         Less than 10 cc.  COMPLICATIONS:                None.  FINDINGS:                     The uterus and tubes were within normal limits. The left ovary was within normal limits without adhesions or stigmata of endometriosis.  The right ovary was enlarged with a 4 cm simple cyst containing clear yellow fluid.  There were no stigmata of endometriosis and no adhesions.  The posterior cul-de-sac contained endometriotic lesions that were powder burn in nature between the uterosacral ligaments and along the right uterosacral ligament.  There was diminution of the left uterosacral ligament. The appendix appeared normal as did the liver edge.  There were, however, powder burn lesions in the right peritoneal surface of the anterior abdominal wall near the area of the appendix.  PROCEDURE:                    The patient was taken to the operating room after appropriate identification and placed on the operating table.  After the obtainment of adequate general anesthesia she was placed in the modified lithotomy position.  The abdomen, perineum, and vagina were prepped with multiple layers of Betadine and a Foley catheter inserted into the bladder and connected to straight drainage.  A single tooth tenaculum was  placed on the anterior cervix.  The abdomen was draped as a sterile field.  The subumbilical area was infiltrated with 5 cc of 0.25% Marcaine as was the suprapubic region to the left of midline.  A subumbilical incision was made and Veress cannula placed through that incision into the peritoneal cavity.  A pneumoperitoneum was created with 4 L of CO2.  The Veress cannula was removed and the laparoscopic Trocar placed through that incision into the peritoneal cavity. The laparoscope was placed through the Trocar sleeve.  A second suprapubic incision was made to the left  of midline and the laparoscopic probe Trocar placed through that incision into the peritoneal cavity under direct visualization.  The above noted findings were made and documented and the harmonic scalpel used to ablate the areas of endometriosis along the posterior cul-de-sac between the uterosacral ligaments and along the right uterosacral ligaments.  It was also used to ablate the endometriosis on the anterior abdominal peritoneum near the appendix.  Hemostasis was noted to be adequate. There was also noted to be powder burn lesions along the sigmoid colon serosa. Copious irrigation was carried out and approximately 100 cc of warm lactated Ringers left in the pelvis.  A needle aspirator was used to aspirate approximately 20 cc of clear yellow fluid from the right ovarian cyst which then collapsed.  All instruments were then removed from the peritoneal cavity under direct visualization as the CO2 was allowed to escape.  ______ suprapubic incisions were closed with subcuticular sutures of 3-0 Vicryl. Sterile dressings were applied.  The single tooth tenaculum and Foley removed and the patient awakened from general anesthesia, then taken to the recovery room in satisfactory condition having tolerated the procedure well with sponge and instrument counts correct. DD:  12/16/99 TD:  12/17/99 Job: [id number]

## 2010-11-14 NOTE — Assessment & Plan Note (Signed)
Susan Gould                               PULMONARY OFFICE NOTE   Susan Gould, Susan Gould                   MRN:          [id number]  DATE:03/16/2006                            DOB:          07-28-1965    HISTORY OF PRESENT ILLNESS:  The patient is a 45 year old female whom I have  been asked to see for possible obstructive sleep apnea. The patient states  that she has been told she has mild snoring, but no one has ever mentioned  pauses in her breathing during sleep.  She has been noted to have choking  arousals.  She typically goes to bed between 10 and 11 and gets up at 6:30  to start her day.  She feels that she is not rested upon arising.  She notes  significant inappropriate daytime sleepiness with periods of inactivity  while sitting at her computer or doing mundane tasks.  She can easily doze  in the evening while watching TV and has some degree of sleepiness with  driving long distances.  Of note the patient's weight is up about 20 pounds  over the last few years.   PAST MEDICAL HISTORY:  Is significant for:  1. Hypertension.  2. History of asthma.  3. History of dyslipidemia.  4. History of allergic rhinitis.  5. Status post cholecystectomy in 1986.  6. History of endometriosis.  7. History of knee surgeries times 2.   CURRENT MEDICATIONS:  1. Birth control daily.  2. Advair 100/50 b.i.d.  3. Flonase daily.  4. Singulair 10 mg daily.  5. Metoprolol 25 mg b.i.d.  6. Zyrtec 10 mg daily.  7. Celexa 20 mg daily.  8. Prilosec OTC q.o.d.  9. Multiple vitamins over the counter that she takes.   Patient has no known drug allergies.   SOCIAL HISTORY:  She is single and had never smoked.   FAMILY HISTORY:  Is remarkable for her father having had asthma, otherwise  noncontributory.   REVIEW OF SYSTEMS:  See history of present illness.  Also patient more  documented in the chart.   PHYSICAL EXAMINATION:  GENERAL:  She is a obese  female in no acute distress.  Blood pressure 130/82.  Pulse is 52.  Temperature is 98.8.  She is 5 feet 4  inches tall weighs 229 pounds.  O2 saturation on room air is 96%.  HEENT:  Pupils equal, round and reactive to light and accommodation.  Extraocular muscles are intact.  Nares shows mild septal deviation to the  left.  Oropharynx with mild tonsillar hypertrophy and palate elongation.  NECK:  Is supple without JVD or lymphadenopathy or palpable organomegaly.  CHEST:  Is totally clear.  CARDIAC:  Reveals regular rate and rhythm.  No murmurs, rubs or gallops.  ABDOMEN:  Soft, nontender.  Good bowel sounds.  General exam, rectal exam:  Breast exam was not done and not indicated.  LOWER EXTREMITIES:  Are without edema.  Good pulses distally.  No calf  tenderness.  NEUROLOGIC:  Alert and oriented with no obvious motor deficits.   IMPRESSION:  Probable obstructive sleep apnea.  The patient is overweight and  has mild abnormal airway anatomy.  She gives a classic history for this and  does need further evaluation.  She is agreeable to this.   PLAN:  1. Schedule for a nocturnal polysomnography.  2. Work on weight loss.            ______________________________  Kathee Delton, MD,FCCP      [facility]/MedQ  DD:  04/19/2006  DT:  04/19/2006  Job #:  [id number]   cc:   Eldred Manges, M.D.  Vikki Ports, M.D.

## 2010-12-01 ENCOUNTER — Other Ambulatory Visit: Payer: Self-pay | Admitting: *Deleted

## 2010-12-01 MED ORDER — METFORMIN HCL ER 500 MG PO TB24: 500.0000 mg | ORAL_TABLET | Freq: Two times a day (BID) | ORAL | Status: DC

## 2011-01-12 ENCOUNTER — Other Ambulatory Visit: Payer: Self-pay | Admitting: Family Medicine

## 2011-01-12 MED ORDER — CITALOPRAM HYDROBROMIDE 40 MG PO TABS: 40.0000 mg | ORAL_TABLET | Freq: Every day | ORAL | Status: DC

## 2011-01-19 ENCOUNTER — Encounter: Payer: Self-pay | Admitting: Family Medicine

## 2011-01-20 ENCOUNTER — Other Ambulatory Visit: Payer: BC Managed Care – PPO

## 2011-01-20 DIAGNOSIS — Z79899 Other long term (current) drug therapy: Secondary | ICD-10-CM

## 2011-01-20 DIAGNOSIS — E7800 Pure hypercholesterolemia, unspecified: Secondary | ICD-10-CM

## 2011-01-20 LAB — HEPATIC FUNCTION PANEL
Albumin: 4.3 g/dL (ref 3.5–5.2)
Alkaline Phosphatase: 80 U/L (ref 39–117)
Bilirubin, Direct: 0.1 mg/dL (ref 0.0–0.3)
Indirect Bilirubin: 0.2 mg/dL (ref 0.0–0.9)
Total Bilirubin: 0.3 mg/dL (ref 0.3–1.2)
Total Protein: 7.2 g/dL (ref 6.0–8.3)

## 2011-01-20 LAB — LIPID PANEL
Cholesterol: 166 mg/dL (ref 0–200)
LDL Cholesterol: 95 mg/dL (ref 0–99)
VLDL: 27 mg/dL (ref 0–40)

## 2011-01-26 ENCOUNTER — Encounter: Payer: Self-pay | Admitting: Family Medicine

## 2011-01-26 ENCOUNTER — Ambulatory Visit (INDEPENDENT_AMBULATORY_CARE_PROVIDER_SITE_OTHER): Payer: BC Managed Care – PPO | Admitting: Family Medicine

## 2011-01-26 VITALS — BP 130/68 | HR 84 | Ht 64.0 in | Wt 260.0 lb

## 2011-01-26 DIAGNOSIS — R5383 Other fatigue: Secondary | ICD-10-CM

## 2011-01-26 DIAGNOSIS — J45909 Unspecified asthma, uncomplicated: Secondary | ICD-10-CM

## 2011-01-26 DIAGNOSIS — E785 Hyperlipidemia, unspecified: Secondary | ICD-10-CM

## 2011-01-26 DIAGNOSIS — IMO0002 Reserved for concepts with insufficient information to code with codable children: Secondary | ICD-10-CM

## 2011-01-26 DIAGNOSIS — M7710 Lateral epicondylitis, unspecified elbow: Secondary | ICD-10-CM

## 2011-01-26 DIAGNOSIS — F329 Major depressive disorder, single episode, unspecified: Secondary | ICD-10-CM

## 2011-01-26 DIAGNOSIS — K219 Gastro-esophageal reflux disease without esophagitis: Secondary | ICD-10-CM

## 2011-01-26 DIAGNOSIS — E119 Type 2 diabetes mellitus without complications: Secondary | ICD-10-CM

## 2011-01-26 DIAGNOSIS — R5381 Other malaise: Secondary | ICD-10-CM

## 2011-01-26 DIAGNOSIS — E348 Other specified endocrine disorders: Secondary | ICD-10-CM

## 2011-01-26 DIAGNOSIS — I10 Essential (primary) hypertension: Secondary | ICD-10-CM

## 2011-01-26 LAB — POCT GLYCOSYLATED HEMOGLOBIN (HGB A1C): Hemoglobin A1C: 5.8

## 2011-01-26 MED ORDER — METFORMIN HCL ER 500 MG PO TB24: ORAL_TABLET | ORAL | Status: DC

## 2011-01-26 NOTE — Patient Instructions (Signed)
Call Susan Gould (or whomever is covered by your insurance, if she isn't) for counseling.  Continue all your current medications.  Try and watch your diet (eat healthy foods, portion control).  Return sooner than 6 months for any worsening problems (ie. Call for PT referral for elbow pain, worsening moods, side effects of meds, higher BP's at home, etc.)

## 2011-01-26 NOTE — Progress Notes (Signed)
Patient presents for follow-up.  We changed her from Pravastatin to Lipitor at last visit.  Seems to be tolerating without side effects.  Had some itching and swelling around her eyes, but resolved (thinks possibly related to walnuts).   Had labs last week--normal liver tests, HDL improved to 44, ratio improved to <4 and LDL 95.  Hypertension follow-up:  Blood pressures elsewhere are 120's/60's.  Denies dizziness, headaches, chest pain.  Denies side effects of medications. Rare dizziness related to over-exertion at [employer], short-lived.  Occasional cough after meals, more likely related to reflux rather than ACEI.  Depression:  Increased stress related to aging parents (who are still living independently). Admits to being an emotional eater (eats both when stressed/upset and when happy, as a reward for a good day).  Has been feeling a little more down lately, hesitant to change her meds further.  Continues to suffer with some discomfort/pain at left elbow, previously diagnosed as medial and lateral epicondylitis.  Is on NSAID's (but only taking once daily and not twice) and uses a tennis elbow strap.  Seems to bother her at night, related to sleep position. Slightly improved from previously, not worsening.  Knee pain--saw Dr. Paula Compton OA and a bone spur.  She was told to continue with NSAIDs.  Pain is improved, tolerable.  Cortisone shot would be next step, not needing now.  Asthma--no complaints, stable.  Meds--see med list in chart. Past Medical History  Diagnosis Date  . Asthma     (Dr. Joya Gaskins)  . Obesity   . Hypertension   . GERD (gastroesophageal reflux disease)   . Allergic rhinitis, cause unspecified     cat/dog/ragweed/mold/wool, s/p immunotherapy x 3-4 yrs  . Hyperlipidemia   . OSA (obstructive sleep apnea)     on CPAP (Dr. Gwenette Greet)  . Depression 2006  . Anxiety 2006  . Colon polyps ?2001 vs 2007    Dr. Oletta Lamas  . Hemorrhoid     internal and external  . Endometriosis    Dr. Leo Grosser  . Insulin resistance     Dr. Leo Grosser   Past Surgical History  Procedure Date  . Cholecystectomy 1986  . Knee surgery '95 and '97    BILATERAL KNEE  . Finger ganglion cyst excision 2003    RIGHT 3rd finger  . Lasik 2000  . Laparoscopy '98, '01    endometriosis (Dr. Leo Grosser)  . Tibial tuberoplasty     left   No Known Allergies  ROS:  SEE HPI.  Denies fevers, URI symptoms, chest pain, shortness of breath, fevers, skin rash.  Dermatitis on scalp comes and goes, improves with steroid cream.  Joint pains--see above  PHYSICAL EXAM: BP 130/68  Pulse 84  Ht 5' 4"  (1.626 m)  Wt 260 lb (117.935 kg)  BMI 44.63 kg/m2 Well developed, pleasant, obese female in no distress Neck: no lymphadenopathy or thyromegaly Heart: regular rate and rhythm without murmur Lungs: clear bilaterally Abdomen: obese, soft, nontender, nondistended, no organomegaly or masses Extremities: no edema.  L elbow--mild tenderness medially and laterally.  Pain with supination against resistance. No pain with pronation or wrist flexion and extension against resistance. Skin: no rash Psych: full range of affect, smiling.  Normal hygiene, grooming, eye contact, speech.  ASSESSMENT/PLAN: 1. HYPERLIPIDEMIA  Comprehensive metabolic panel, Lipid panel  2. HYPERTENSION  Comprehensive metabolic panel  3. Type II or unspecified type diabetes mellitus without mention of complication, not stated as uncontrolled  POCT HgB A1C, metFORMIN (GLUCOPHAGE-XR) 500 MG 24 hr tablet,  Hemoglobin A1c  4. Epicondylitis syndrome of elbow     lateral>medial.  Rest; increase NSAIDs to BID for up to 2 weeks  5. ASTHMA    6. GERD    7. INSULIN RESISTANCE SYNDROME  Hemoglobin A1c  8. Depressive disorder, not elsewhere classified     Continue citalopram.  Counseling encouraged.  Yamel Whitt's number given  9. Other malaise and fatigue  Vitamin D 25 hydroxy, TSH, CBC with Differential   Epicondylitis--PT recommended if not continuing  to improve.  Patient will call for referral if desired  Recommended TdaP and pneumovax--declines today. Will consider for next visit in 6 months

## 2011-02-25 ENCOUNTER — Other Ambulatory Visit: Payer: Self-pay | Admitting: Family Medicine

## 2011-02-25 MED ORDER — DICLOFENAC SODIUM 75 MG PO TBEC: 75.0000 mg | DELAYED_RELEASE_TABLET | Freq: Two times a day (BID) | ORAL | Status: DC

## 2011-04-16 ENCOUNTER — Other Ambulatory Visit (HOSPITAL_COMMUNITY): Payer: Self-pay | Admitting: Obstetrics and Gynecology

## 2011-04-16 DIAGNOSIS — Z1231 Encounter for screening mammogram for malignant neoplasm of breast: Secondary | ICD-10-CM

## 2011-04-27 ENCOUNTER — Ambulatory Visit (HOSPITAL_COMMUNITY)
Admission: RE | Admit: 2011-04-27 | Discharge: 2011-04-27 | Disposition: A | Discharge status: Home / Self Care | Payer: BC Managed Care – PPO | Source: Ambulatory Visit | Attending: Obstetrics and Gynecology | Admitting: Obstetrics and Gynecology

## 2011-04-27 ENCOUNTER — Encounter: Payer: Self-pay | Admitting: Family Medicine

## 2011-04-27 DIAGNOSIS — Z1231 Encounter for screening mammogram for malignant neoplasm of breast: Secondary | ICD-10-CM | POA: Insufficient documentation

## 2011-05-26 ENCOUNTER — Telehealth: Payer: Self-pay | Admitting: Family Medicine

## 2011-05-27 ENCOUNTER — Other Ambulatory Visit: Payer: Self-pay | Admitting: *Deleted

## 2011-05-27 DIAGNOSIS — I10 Essential (primary) hypertension: Secondary | ICD-10-CM

## 2011-05-27 MED ORDER — LISINOPRIL-HYDROCHLOROTHIAZIDE 20-25 MG PO TABS: 1.0000 | ORAL_TABLET | Freq: Every day | ORAL | Status: DC

## 2011-05-29 NOTE — Telephone Encounter (Signed)
DONE

## 2011-06-19 ENCOUNTER — Telehealth: Payer: Self-pay | Admitting: Internal Medicine

## 2011-06-19 MED ORDER — ATORVASTATIN CALCIUM 40 MG PO TABS: 40.0000 mg | ORAL_TABLET | Freq: Every day | ORAL | Status: DC

## 2011-06-24 NOTE — Telephone Encounter (Signed)
done

## 2011-07-13 ENCOUNTER — Telehealth: Payer: Self-pay | Admitting: Family Medicine

## 2011-07-13 DIAGNOSIS — F329 Major depressive disorder, single episode, unspecified: Secondary | ICD-10-CM

## 2011-07-13 MED ORDER — CITALOPRAM HYDROBROMIDE 40 MG PO TABS: 40.0000 mg | ORAL_TABLET | Freq: Every day | ORAL | Status: DC

## 2011-07-13 NOTE — Telephone Encounter (Signed)
E-rxd

## 2011-07-23 ENCOUNTER — Encounter: Payer: Self-pay | Admitting: Internal Medicine

## 2011-07-27 ENCOUNTER — Other Ambulatory Visit: Payer: Self-pay | Admitting: Family Medicine

## 2011-07-27 ENCOUNTER — Other Ambulatory Visit: Payer: BC Managed Care – PPO

## 2011-07-27 ENCOUNTER — Telehealth: Payer: Self-pay | Admitting: Internal Medicine

## 2011-07-27 DIAGNOSIS — E119 Type 2 diabetes mellitus without complications: Secondary | ICD-10-CM

## 2011-07-27 DIAGNOSIS — E348 Other specified endocrine disorders: Secondary | ICD-10-CM

## 2011-07-27 DIAGNOSIS — R5381 Other malaise: Secondary | ICD-10-CM

## 2011-07-27 DIAGNOSIS — I10 Essential (primary) hypertension: Secondary | ICD-10-CM

## 2011-07-27 DIAGNOSIS — E785 Hyperlipidemia, unspecified: Secondary | ICD-10-CM

## 2011-07-27 LAB — COMPREHENSIVE METABOLIC PANEL
ALT: 22 U/L (ref 0–35)
AST: 27 U/L (ref 0–37)
Albumin: 4.3 g/dL (ref 3.5–5.2)
Alkaline Phosphatase: 76 U/L (ref 39–117)
BUN: 22 mg/dL (ref 6–23)
CO2: 22 mEq/L (ref 19–32)
Calcium: 9.5 mg/dL (ref 8.4–10.5)
Chloride: 102 mEq/L (ref 96–112)
Creat: 1.09 mg/dL (ref 0.50–1.10)
Glucose, Bld: 76 mg/dL (ref 70–99)
Potassium: 4.6 mEq/L (ref 3.5–5.3)
Sodium: 137 mEq/L (ref 135–145)
Total Bilirubin: 0.4 mg/dL (ref 0.3–1.2)
Total Protein: 7.1 g/dL (ref 6.0–8.3)

## 2011-07-27 LAB — CBC WITH DIFFERENTIAL/PLATELET
Basophils Absolute: 0.1 10*3/uL (ref 0.0–0.1)
Basophils Relative: 1 % (ref 0–1)
Eosinophils Relative: 8 % — ABNORMAL HIGH (ref 0–5)
Lymphocytes Relative: 26 % (ref 12–46)
MCHC: 32.2 g/dL (ref 30.0–36.0)
MCV: 92.2 fL (ref 78.0–100.0)
Monocytes Absolute: 0.6 10*3/uL (ref 0.1–1.0)
Neutro Abs: 5.4 10*3/uL (ref 1.7–7.7)
Platelets: 302 10*3/uL (ref 150–400)
RDW: 13.5 % (ref 11.5–15.5)
WBC: 9.2 10*3/uL (ref 4.0–10.5)

## 2011-07-27 LAB — LIPID PANEL
Cholesterol: 158 mg/dL (ref 0–200)
HDL: 42 mg/dL (ref >39)
LDL Cholesterol: 77 mg/dL (ref 0–99)
Total CHOL/HDL Ratio: 3.8 Ratio
Triglycerides: 195 mg/dL — ABNORMAL HIGH (ref <150)
VLDL: 39 mg/dL (ref 0–40)

## 2011-07-27 LAB — TSH: TSH: 1.591 u[IU]/mL (ref 0.350–4.500)

## 2011-07-27 MED ORDER — METFORMIN HCL ER 500 MG PO TB24: ORAL_TABLET | ORAL | Status: DC

## 2011-07-27 NOTE — Telephone Encounter (Signed)
Med sent in.

## 2011-07-27 NOTE — Telephone Encounter (Signed)
rx sent in 

## 2011-07-28 LAB — VITAMIN D 25 HYDROXY (VIT D DEFICIENCY, FRACTURES): Vit D, 25-Hydroxy: 44 ng/mL (ref 30–89)

## 2011-07-28 LAB — HEMOGLOBIN A1C: Mean Plasma Glucose: 120 mg/dL — ABNORMAL HIGH (ref <117)

## 2011-07-29 ENCOUNTER — Other Ambulatory Visit: Payer: Self-pay | Admitting: *Deleted

## 2011-07-29 ENCOUNTER — Encounter: Payer: Self-pay | Admitting: Family Medicine

## 2011-07-29 ENCOUNTER — Ambulatory Visit (INDEPENDENT_AMBULATORY_CARE_PROVIDER_SITE_OTHER): Payer: BC Managed Care – PPO | Admitting: Family Medicine

## 2011-07-29 VITALS — BP 122/70 | HR 84 | Ht 64.0 in | Wt 264.0 lb

## 2011-07-29 DIAGNOSIS — D649 Anemia, unspecified: Secondary | ICD-10-CM

## 2011-07-29 DIAGNOSIS — Z79899 Other long term (current) drug therapy: Secondary | ICD-10-CM

## 2011-07-29 DIAGNOSIS — Z23 Encounter for immunization: Secondary | ICD-10-CM

## 2011-07-29 DIAGNOSIS — M1710 Unilateral primary osteoarthritis, unspecified knee: Secondary | ICD-10-CM | POA: Insufficient documentation

## 2011-07-29 DIAGNOSIS — E348 Other specified endocrine disorders: Secondary | ICD-10-CM

## 2011-07-29 DIAGNOSIS — E119 Type 2 diabetes mellitus without complications: Secondary | ICD-10-CM

## 2011-07-29 DIAGNOSIS — E782 Mixed hyperlipidemia: Secondary | ICD-10-CM

## 2011-07-29 DIAGNOSIS — I10 Essential (primary) hypertension: Secondary | ICD-10-CM

## 2011-07-29 DIAGNOSIS — IMO0002 Reserved for concepts with insufficient information to code with codable children: Secondary | ICD-10-CM

## 2011-07-29 LAB — FOLATE: Folate: 16.8 ng/mL

## 2011-07-29 LAB — FERRITIN: Ferritin: 27 ng/mL (ref 10–291)

## 2011-07-29 MED ORDER — DICLOFENAC SODIUM 75 MG PO TBEC: 75.0000 mg | DELAYED_RELEASE_TABLET | Freq: Two times a day (BID) | ORAL | Status: DC

## 2011-07-29 MED ORDER — ATORVASTATIN CALCIUM 40 MG PO TABS: 40.0000 mg | ORAL_TABLET | Freq: Every day | ORAL | Status: DC

## 2011-07-29 NOTE — Progress Notes (Signed)
Chief complaint: 6 month med check, labs done 07/27/11. Needs refill of diclofenac. Mother diagnosed with breast ca 06/2011  HPI: HTN; Not checking BP elsewhere.  Denies headaches, dizziness, chest pain, palpitations or side effects from BP medications.  Compliant with meds.  Hyperlipidemia: compliant with medications, denies side effects.  Admits that her diet was poor during the holidays, but is doing better.  Poor diet over the last several days related to stress, eating more fried foods (related to her mother's health--just found out her breast cancer is in her lymph nodes).  Diabetes/insulin resistance:  Denies hypoglycemia, polydipsia or polyuria.  Last eye exam was 3-4 years ago. Checks feet regularly and has no skin concerns.  Anemia: Noted to be anemic on recent labs. Has a hemorrhoid, but hasn't been bleeding.  Has no vaginal bleeding or periods.  Denies weakness, dizziness or fatigue.  Review of chart shows 03/2010 Hgb 11.8, Hct 34.2.  Hasn't donated blood in over a year, prior to that was hit or miss if she was able to give (related to her hgb being low)  Past Medical History  Diagnosis Date  . Asthma     (Dr. Joya Gaskins)  . Obesity   . Hypertension   . GERD (gastroesophageal reflux disease)   . Allergic rhinitis, cause unspecified     cat/dog/ragweed/mold/wool, s/p immunotherapy x 3-4 yrs  . Hyperlipidemia   . OSA (obstructive sleep apnea)     on CPAP (Dr. Gwenette Greet)  . Depression 2006  . Anxiety 2006  . Colon polyps ?2001 vs 2007    Dr. Oletta Lamas  . Hemorrhoid     internal and external  . Endometriosis     Dr. Leo Grosser  . Insulin resistance     Dr. Leo Grosser  . Family history of breast cancer in female     Past Surgical History  Procedure Date  . Cholecystectomy 1986  . Knee surgery '95 and '97    BILATERAL KNEE  . Finger ganglion cyst excision 2003    RIGHT 3rd finger  . Lasik 2000  . Laparoscopy '98, '01    endometriosis (Dr. Leo Grosser)  . Tibial tuberoplasty     left      History   Social History  . Marital Status: Single    Spouse Name: N/A    Number of Children: N/A  . Years of Education: N/A   Occupational History  . Not on file.   Social History Main Topics  . Smoking status: Never Smoker   . Smokeless tobacco: Never Used  . Alcohol Use: Yes     6 drinks a week, beer  . Drug Use: No  . Sexually Active: Not on file   Other Topics Concern  . Not on file   Social History Narrative  . No narrative on file    Family History  Problem Relation Age of Onset  . Diabetes Mother   . Hypertension Mother   . Hyperlipidemia Mother   . Arthritis Mother   . Cancer Mother 38    breast cancer  . Diabetes Father   . Hyperlipidemia Father   . Hypertension Father   . Asthma Father   . Arthritis Father    Current Outpatient Prescriptions on File Prior to Visit  Medication Sig Dispense Refill  . Calcium Carbonate-Vitamin D (CALCIUM + D) 600-200 MG-UNIT TABS Take 2 tablets by mouth daily.        . cetirizine (ZYRTEC) 10 MG tablet Take 10 mg by mouth daily.        Marland Kitchen  citalopram (CELEXA) 40 MG tablet Take 1 tablet (40 mg total) by mouth daily.  30 tablet  5  . fish oil-omega-3 fatty acids 1000 MG capsule Take 3 g by mouth daily.       . fluticasone (FLONASE) 50 MCG/ACT nasal spray Place 2 sprays into the nose daily.        . Fluticasone-Salmeterol (ADVAIR DISKUS) 250-50 MCG/DOSE AEPB Inhale 1 puff into the lungs every 12 (twelve) hours.        Marland Kitchen lisinopril-hydrochlorothiazide (PRINZIDE,ZESTORETIC) 20-25 MG per tablet Take 1 tablet by mouth daily.  30 tablet  2  . medroxyPROGESTERone (PROVERA) 10 MG tablet Take 10 mg by mouth daily.       . metFORMIN (GLUCOPHAGE-XR) 500 MG 24 hr tablet Take 2 tablets in morning, 1 in evening  90 tablet  0  . Multiple Vitamins-Minerals (MULTIVITAMIN WITH MINERALS) tablet Take 1 tablet by mouth daily.        Marland Kitchen omeprazole (PRILOSEC) 20 MG capsule Take 20 mg by mouth daily.        . vitamin B-12 (CYANOCOBALAMIN) 1000  MCG tablet Take 1,000 mcg by mouth daily.        Marland Kitchen zinc gluconate 50 MG tablet Take 50 mg by mouth daily.        Marland Kitchen DISCONTD: atorvastatin (LIPITOR) 40 MG tablet Take 1 tablet (40 mg total) by mouth daily.  30 tablet  1  . DISCONTD: diclofenac (VOLTAREN) 75 MG EC tablet Take 1 tablet (75 mg total) by mouth 2 (two) times daily.  60 tablet  4  . cyclobenzaprine (FLEXERIL) 10 MG tablet Take 10 mg by mouth as needed.         No Known Allergies  ROS:  Had plantar fasciitis in the fall, saw Dr. Kern Reap, and is doing better now.  Has some L lateral foot pain since jumping more in boot camp last week, better today after icing it yesterday. Denies fevers, URI symptoms, shortness of breath.  Slight cough from reflux; denies wheezing. No chest pain, urinary symptoms or other concerns.  PHYSICAL EXAM: BP 122/70  Pulse 84  Ht 5' 4"  (1.626 m)  Wt 264 lb (119.75 kg)  BMI 45.32 kg/m2 Well developed, pleasant, obese female in no distress Neck: no lymphadenopathy, thyromegaly or carotid bruit Heart: regular rate and rhythm without murmur Lungs: clear bilaterally Abdomen: soft, obese, nontender, no organomegaly, mass or tenderness Extremities: no clubbing, cyanosis or edema. 2+ pulses.  Normal sensation.  Normal diabetic foot exam--see other section  Lab Results  Component Value Date   HGBA1C 5.8* 07/27/2011   Lab Results  Component Value Date   CHOL 158 07/27/2011   HDL 42 07/27/2011   LDLCALC 77 07/27/2011   TRIG 195* 07/27/2011   CHOLHDL 3.8 07/27/2011     Chemistry      Component Value Date/Time   NA 137 07/27/2011 0826   K 4.6 07/27/2011 0826   CL 102 07/27/2011 0826   CO2 22 07/27/2011 0826   BUN 22 07/27/2011 0826   CREATININE 1.09 07/27/2011 0826      Component Value Date/Time   CALCIUM 9.5 07/27/2011 0826   ALKPHOS 76 07/27/2011 0826   AST 27 07/27/2011 0826   ALT 22 07/27/2011 0826   BILITOT 0.4 07/27/2011 0826     Lab Results  Component Value Date   WBC 9.2 07/27/2011   HGB 11.1*  07/27/2011   HCT 34.5* 07/27/2011   MCV 92.2 07/27/2011   PLT 302 07/27/2011  ASSESSMENT/PLAN: 1. HYPERTENSION    2. Mixed hyperlipidemia  atorvastatin (LIPITOR) 40 MG tablet, Lipid panel  3. OA (osteoarthritis) of knee  diclofenac (VOLTAREN) 75 MG EC tablet  4. Need for Tdap vaccination  Tdap vaccine greater than or equal to 7yo IM  5. Type II or unspecified type diabetes mellitus without mention of complication, not stated as uncontrolled  Hemoglobin A1c, Comprehensive metabolic panel  6. Anemia, unspecified  CBC with Differential  7. Encounter for long-term (current) use of other medications  Comprehensive metabolic panel  8. INSULIN RESISTANCE SYNDROME     Anemia--add on b12, folate, iron and ferritin Encouraged continued exercise, diet--healthy diet and portion control, weight loss strongly encouraged. Lipids are at goal HTN is controlled  Schedule annual eye exam Call Dr. Oletta Lamas to check and see when due for next colonoscopy (if had adenomatous polyps in 2007, then past due)

## 2011-07-29 NOTE — Patient Instructions (Signed)
Schedule diabetic eye exam Call Dr. Oletta Lamas to check and see when due for next colonoscopy (if had adenomatous polyps in 2007, then past due)

## 2011-08-03 ENCOUNTER — Telehealth: Payer: Self-pay | Admitting: Family Medicine

## 2011-08-03 NOTE — Telephone Encounter (Signed)
Pt called and states she was reviewing her AVS and states we put that DX was uncontrolled hypertension with native Neph.   And she thinks this is wrong that her hypertension is controlled.  Please advise pt. [phone]

## 2011-08-03 NOTE — Telephone Encounter (Signed)
Looked in pt's chart and this diagnosis is not anywhere in chart, also pulled AVS from that date of service and it is not there either. Called patient and reassured her that there must have been some mistake but not to worry that this diagnosis does not appear anywhere in her chart. Pt verbalized understanding.

## 2011-08-03 NOTE — Telephone Encounter (Signed)
Review of note states we used dx of 401.9, which is unspecified hypertension.  Can you look into this? thanks

## 2011-08-20 ENCOUNTER — Telehealth: Payer: Self-pay | Admitting: Internal Medicine

## 2011-08-20 DIAGNOSIS — I10 Essential (primary) hypertension: Secondary | ICD-10-CM

## 2011-08-20 MED ORDER — LISINOPRIL-HYDROCHLOROTHIAZIDE 20-25 MG PO TABS: 1.0000 | ORAL_TABLET | Freq: Every day | ORAL | Status: DC

## 2011-08-21 NOTE — Telephone Encounter (Signed)
done

## 2011-09-01 ENCOUNTER — Other Ambulatory Visit: Payer: Self-pay | Admitting: Family Medicine

## 2011-09-10 ENCOUNTER — Other Ambulatory Visit: Payer: Self-pay | Admitting: Critical Care Medicine

## 2011-09-10 NOTE — Telephone Encounter (Signed)
Pt was last seen by Dr. Joya Gaskins on 08/05/10 .  She does have a pending OV with him on 09/30/11.  Will send 1 rx to pharm but she will need to keep OV in April.

## 2011-09-30 ENCOUNTER — Ambulatory Visit: Payer: BC Managed Care – PPO | Admitting: Critical Care Medicine

## 2011-09-30 ENCOUNTER — Other Ambulatory Visit: Payer: Self-pay | Admitting: Family Medicine

## 2011-10-13 ENCOUNTER — Encounter: Payer: Self-pay | Admitting: *Deleted

## 2011-10-14 ENCOUNTER — Ambulatory Visit (INDEPENDENT_AMBULATORY_CARE_PROVIDER_SITE_OTHER): Payer: BC Managed Care – PPO | Admitting: Critical Care Medicine

## 2011-10-14 ENCOUNTER — Encounter: Payer: Self-pay | Admitting: Critical Care Medicine

## 2011-10-14 VITALS — BP 116/60 | HR 81 | Temp 99.3°F | Ht 64.0 in | Wt 265.6 lb

## 2011-10-14 DIAGNOSIS — J45909 Unspecified asthma, uncomplicated: Secondary | ICD-10-CM

## 2011-10-14 MED ORDER — MONTELUKAST SODIUM 10 MG PO TABS: 10.0000 mg | ORAL_TABLET | Freq: Every day | ORAL | Status: DC

## 2011-10-14 NOTE — Progress Notes (Signed)
Subjective:    Patient ID: Susan Gould, female    DOB: 03-Dec-1965, 46 y.o.   MRN: [id number]  HPI 46 y.o. WF with moderate persistent asthma   10/14/2011 Since last OV.  Mother with breast Ca.  Very busy with this issue.  No asthma flare  PUL ASTHMA HISTORY 10/14/2011  Symptoms 0-2 days/week  Nighttime awakenings 0-2/month  Interference with activity No limitations  SABA use 0-2 days/wk  Exacerbations requiring oral steroids 0-1 / year   Past Medical History  Diagnosis Date  . Asthma     (Dr. Joya Gaskins)  . Obesity   . Hypertension   . GERD (gastroesophageal reflux disease)   . Allergic rhinitis, cause unspecified     cat/dog/ragweed/mold/wool, s/p immunotherapy x 3-4 yrs  . Hyperlipidemia   . OSA (obstructive sleep apnea)     on CPAP (Dr. Gwenette Greet)  . Depression 2006  . Anxiety 2006  . Colon polyps ?2001 vs 2007    Dr. Oletta Lamas  . Hemorrhoid     internal and external  . Endometriosis     Dr. Leo Grosser  . Insulin resistance     Dr. Leo Grosser  . Family history of breast cancer in female      Family History  Problem Relation Age of Onset  . Diabetes Mother   . Hypertension Mother   . Hyperlipidemia Mother   . Arthritis Mother   . Cancer Mother 61    breast cancer  . Diabetes Father   . Hyperlipidemia Father   . Hypertension Father   . Asthma Father   . Arthritis Father      History   Social History  . Marital Status: Single    Spouse Name: N/A    Number of Children: 0  . Years of Education: N/A   Occupational History  . Medical Transportation    Social History Main Topics  . Smoking status: Never Smoker   . Smokeless tobacco: Never Used  . Alcohol Use: Yes     6 drinks a week, beer  . Drug Use: No  . Sexually Active: Not on file   Other Topics Concern  . Not on file   Social History Narrative  . No narrative on file     No Known Allergies   Outpatient Prescriptions Prior to Visit  Medication Sig Dispense Refill  . ADVAIR DISKUS 250-50  MCG/DOSE AEPB inhale 1 dose by mouth twice a day  60 each  0  . atorvastatin (LIPITOR) 40 MG tablet Take 1 tablet (40 mg total) by mouth daily.  30 tablet  5  . Calcium Carbonate-Vitamin D (CALCIUM + D) 600-200 MG-UNIT TABS Take 2 tablets by mouth daily.        . cetirizine (ZYRTEC) 10 MG tablet Take 10 mg by mouth daily.        . citalopram (CELEXA) 40 MG tablet Take 1 tablet (40 mg total) by mouth daily.  30 tablet  5  . diclofenac (VOLTAREN) 75 MG EC tablet Take 1 tablet (75 mg total) by mouth 2 (two) times daily.  60 tablet  5  . fish oil-omega-3 fatty acids 1000 MG capsule Take 3 g by mouth daily.       . fluticasone (FLONASE) 50 MCG/ACT nasal spray place 1 to 2 sprays into each nostril once daily  16 g  0  . lisinopril-hydrochlorothiazide (PRINZIDE,ZESTORETIC) 20-25 MG per tablet Take 1 tablet by mouth daily.  30 tablet  5  . medroxyPROGESTERone (PROVERA) 10 MG  tablet Take 20 mg by mouth daily.       . metFORMIN (GLUCOPHAGE-XR) 500 MG 24 hr tablet take 2 tablets by mouth every morning then 1 tablet every evening  90 tablet  1  . Multiple Vitamins-Minerals (MULTIVITAMIN WITH MINERALS) tablet Take 1 tablet by mouth daily.        Marland Kitchen omeprazole (PRILOSEC) 20 MG capsule Take 20 mg by mouth daily.        . vitamin B-12 (CYANOCOBALAMIN) 1000 MCG tablet Take 1,000 mcg by mouth daily.        Marland Kitchen zinc gluconate 50 MG tablet Take 50 mg by mouth daily.        . cyclobenzaprine (FLEXERIL) 10 MG tablet Take 10 mg by mouth as needed.            Review of Systems Constitutional:   No  weight loss, night sweats,  Fevers, chills, fatigue, lassitude. HEENT:   No headaches,  Difficulty swallowing,  Tooth/dental problems,  Sore throat,                No sneezing, itching, ear ache, nasal congestion, post nasal drip,   CV:  No chest pain,  Orthopnea, PND, swelling in lower extremities, anasarca, dizziness, palpitations  GI  No heartburn, indigestion, abdominal pain, nausea, vomiting, diarrhea, change in  bowel habits, loss of appetite  Resp: No shortness of breath with exertion or at rest.  No excess mucus, no productive cough,  No non-productive cough,  No coughing up of blood.  No change in color of mucus.  No wheezing.  No chest wall deformity  Skin: no rash or lesions.  GU: no dysuria, change in color of urine, no urgency or frequency.  No flank pain.  MS:  No joint pain or swelling.  No decreased range of motion.  No back pain.  Psych:  No change in mood or affect. No depression or anxiety.  No memory loss.     Objective:   Physical Exam  Filed Vitals:   10/14/11 1554  BP: 116/60  Pulse: 81  Temp: 99.3 F (37.4 C)  TempSrc: Oral  Height: 5' 4"  (1.626 m)  Weight: 265 lb 9.6 oz (120.475 kg)  SpO2: 95%    Gen: Pleasant, well-nourished, in no distress,  normal affect  ENT: No lesions,  mouth clear,  oropharynx clear, no postnasal drip  Neck: No JVD, no TMG, no carotid bruits  Lungs: No use of accessory muscles, no dullness to percussion, clear without rales or rhonchi  Cardiovascular: RRR, heart sounds normal, no murmur or gallops, no peripheral edema  Abdomen: soft and NT, no HSM,  BS normal  Musculoskeletal: No deformities, no cyanosis or clubbing  Neuro: alert, non focal  Skin: Warm, no lesions or rashes         Assessment & Plan:   ASTHMA Stable moderate persistent asthma Plan Maintain inhaled medications as prescribed    Updated Medication List Outpatient Encounter Prescriptions as of 10/14/2011  Medication Sig Dispense Refill  . ADVAIR DISKUS 250-50 MCG/DOSE AEPB inhale 1 dose by mouth twice a day  60 each  0  . atorvastatin (LIPITOR) 40 MG tablet Take 1 tablet (40 mg total) by mouth daily.  30 tablet  5  . Calcium Carbonate-Vitamin D (CALCIUM + D) 600-200 MG-UNIT TABS Take 2 tablets by mouth daily.        . cetirizine (ZYRTEC) 10 MG tablet Take 10 mg by mouth daily.        Marland Kitchen  citalopram (CELEXA) 40 MG tablet Take 1 tablet (40 mg total) by mouth  daily.  30 tablet  5  . diclofenac (VOLTAREN) 75 MG EC tablet Take 1 tablet (75 mg total) by mouth 2 (two) times daily.  60 tablet  5  . ferrous sulfate 325 (65 FE) MG tablet Take 325 mg by mouth daily.      . fish oil-omega-3 fatty acids 1000 MG capsule Take 3 g by mouth daily.       . fluticasone (FLONASE) 50 MCG/ACT nasal spray place 1 to 2 sprays into each nostril once daily  16 g  0  . lisinopril-hydrochlorothiazide (PRINZIDE,ZESTORETIC) 20-25 MG per tablet Take 1 tablet by mouth daily.  30 tablet  5  . medroxyPROGESTERone (PROVERA) 10 MG tablet Take 20 mg by mouth daily.       . metFORMIN (GLUCOPHAGE-XR) 500 MG 24 hr tablet take 2 tablets by mouth every morning then 1 tablet every evening  90 tablet  1  . Multiple Vitamins-Minerals (MULTIVITAMIN WITH MINERALS) tablet Take 1 tablet by mouth daily.        Marland Kitchen omeprazole (PRILOSEC) 20 MG capsule Take 20 mg by mouth daily.        . vitamin B-12 (CYANOCOBALAMIN) 1000 MCG tablet Take 1,000 mcg by mouth daily.        Marland Kitchen zinc gluconate 50 MG tablet Take 50 mg by mouth daily.        . montelukast (SINGULAIR) 10 MG tablet Take 1 tablet (10 mg total) by mouth daily.  30 tablet  11  . DISCONTD: cyclobenzaprine (FLEXERIL) 10 MG tablet Take 10 mg by mouth as needed.

## 2011-10-14 NOTE — Patient Instructions (Addendum)
No change in medications. Return in         12 months  Ok to try singulair again

## 2011-10-14 NOTE — Assessment & Plan Note (Signed)
Stable moderate persistent asthma Plan Maintain inhaled medications as prescribed

## 2011-10-30 ENCOUNTER — Other Ambulatory Visit: Payer: Self-pay | Admitting: Critical Care Medicine

## 2011-11-19 ENCOUNTER — Other Ambulatory Visit: Payer: Self-pay | Admitting: Family Medicine

## 2011-11-19 NOTE — Telephone Encounter (Signed)
Is this okay?

## 2011-11-19 NOTE — Telephone Encounter (Signed)
done

## 2011-11-25 ENCOUNTER — Other Ambulatory Visit: Payer: Self-pay | Admitting: Family Medicine

## 2011-11-25 DIAGNOSIS — E348 Other specified endocrine disorders: Secondary | ICD-10-CM

## 2011-11-25 NOTE — Telephone Encounter (Signed)
This is your pt

## 2011-12-28 ENCOUNTER — Other Ambulatory Visit: Payer: Self-pay | Admitting: Critical Care Medicine

## 2012-01-04 ENCOUNTER — Other Ambulatory Visit: Payer: Self-pay | Admitting: Family Medicine

## 2012-01-10 ENCOUNTER — Other Ambulatory Visit: Payer: Self-pay | Admitting: Family Medicine

## 2012-01-22 ENCOUNTER — Other Ambulatory Visit: Payer: BC Managed Care – PPO

## 2012-01-22 DIAGNOSIS — E782 Mixed hyperlipidemia: Secondary | ICD-10-CM

## 2012-01-22 DIAGNOSIS — D649 Anemia, unspecified: Secondary | ICD-10-CM

## 2012-01-22 DIAGNOSIS — E119 Type 2 diabetes mellitus without complications: Secondary | ICD-10-CM

## 2012-01-22 DIAGNOSIS — Z79899 Other long term (current) drug therapy: Secondary | ICD-10-CM

## 2012-01-22 LAB — COMPREHENSIVE METABOLIC PANEL
ALT: 24 U/L (ref 0–35)
AST: 27 U/L (ref 0–37)
Albumin: 4.3 g/dL (ref 3.5–5.2)
Alkaline Phosphatase: 66 U/L (ref 39–117)
Potassium: 4.8 mEq/L (ref 3.5–5.3)
Sodium: 138 mEq/L (ref 135–145)
Total Protein: 7.2 g/dL (ref 6.0–8.3)

## 2012-01-22 LAB — CBC WITH DIFFERENTIAL/PLATELET
Basophils Absolute: 0 10*3/uL (ref 0.0–0.1)
Eosinophils Absolute: 0.2 10*3/uL (ref 0.0–0.7)
Eosinophils Relative: 2 % (ref 0–5)
HCT: 33.5 % — ABNORMAL LOW (ref 36.0–46.0)
MCH: 29.4 pg (ref 26.0–34.0)
MCHC: 33.1 g/dL (ref 30.0–36.0)
MCV: 88.6 fL (ref 78.0–100.0)
Monocytes Absolute: 0.8 10*3/uL (ref 0.1–1.0)
Platelets: 327 10*3/uL (ref 150–400)
RDW: 14.2 % (ref 11.5–15.5)

## 2012-01-22 LAB — LIPID PANEL
HDL: 40 mg/dL (ref >39)
Total CHOL/HDL Ratio: 4 Ratio
VLDL: 29 mg/dL (ref 0–40)

## 2012-01-27 ENCOUNTER — Ambulatory Visit (INDEPENDENT_AMBULATORY_CARE_PROVIDER_SITE_OTHER): Payer: BC Managed Care – PPO | Admitting: Family Medicine

## 2012-01-27 ENCOUNTER — Encounter: Payer: Self-pay | Admitting: Family Medicine

## 2012-01-27 VITALS — BP 138/64 | HR 80 | Ht 64.0 in | Wt 270.0 lb

## 2012-01-27 DIAGNOSIS — F411 Generalized anxiety disorder: Secondary | ICD-10-CM

## 2012-01-27 DIAGNOSIS — E348 Other specified endocrine disorders: Secondary | ICD-10-CM

## 2012-01-27 DIAGNOSIS — K219 Gastro-esophageal reflux disease without esophagitis: Secondary | ICD-10-CM

## 2012-01-27 DIAGNOSIS — I10 Essential (primary) hypertension: Secondary | ICD-10-CM

## 2012-01-27 DIAGNOSIS — M179 Osteoarthritis of knee, unspecified: Secondary | ICD-10-CM

## 2012-01-27 DIAGNOSIS — E782 Mixed hyperlipidemia: Secondary | ICD-10-CM

## 2012-01-27 DIAGNOSIS — IMO0002 Reserved for concepts with insufficient information to code with codable children: Secondary | ICD-10-CM

## 2012-01-27 DIAGNOSIS — M1710 Unilateral primary osteoarthritis, unspecified knee: Secondary | ICD-10-CM

## 2012-01-27 MED ORDER — LISINOPRIL-HYDROCHLOROTHIAZIDE 20-25 MG PO TABS: 1.0000 | ORAL_TABLET | Freq: Every day | ORAL | Status: DC

## 2012-01-27 MED ORDER — ATORVASTATIN CALCIUM 40 MG PO TABS: 40.0000 mg | ORAL_TABLET | Freq: Every day | ORAL | Status: DC

## 2012-01-27 MED ORDER — CITALOPRAM HYDROBROMIDE 40 MG PO TABS: 40.0000 mg | ORAL_TABLET | Freq: Every day | ORAL | Status: DC

## 2012-01-27 MED ORDER — METFORMIN HCL ER 500 MG PO TB24: ORAL_TABLET | ORAL | Status: DC

## 2012-01-27 NOTE — Progress Notes (Signed)
Chief Complaint  Patient presents with  . Med check    labs done 01/22/12.   HPI  Had some recent stress fractures in her feet, but doing better now (treated with walking boot).  Insulin resistance--originally started on Metformin by GYN.  Taking it as directed without side effects. She exercises regularly, but hasn't lost weight.  Hypertension follow-up:  Blood pressures aren't checked elsewhere except at Dr. Bettina Gavia office (in [place]), where they have been fine.  Denies dizziness, headaches, chest pain, edema.  Denies side effects of medications. She has a dry cough, which started prior to lisinopril and seems to be related to reflux, as cough is worse if she doesn't take prilosec.  GERD--takes Prilosec daily.  Can skip it for a few days if she avoids certain foods, including dairy. Cough recurs when she stops the prilosec.  Anemia--occasionally has hemorrhoids that bleed.  Denies any vaginal bleeding.  Colonoscopy has been within 10 years.  Takes iron supplement daily.  Lesion L lower leg--present x 6 months, getting bigger.  Never was tender, inflamed.  Tried an OTC Dr. Felicie Morn wart treatment for 3 times without improvement.  Past Medical History  Diagnosis Date  . Asthma     (Dr. Joya Gaskins)  . Obesity   . Hypertension   . GERD (gastroesophageal reflux disease)   . Allergic rhinitis, cause unspecified     cat/dog/ragweed/mold/wool, s/p immunotherapy x 3-4 yrs  . Hyperlipidemia   . OSA (obstructive sleep apnea)     on CPAP (Dr. Gwenette Greet)  . Depression 2006  . Anxiety 2006  . Colon polyps ?2001 vs 2007    Dr. Oletta Lamas  . Hemorrhoid     internal and external  . Endometriosis     Dr. Leo Grosser  . Insulin resistance     Dr. Leo Grosser  . Family history of breast cancer in female    Past Surgical History  Procedure Date  . Cholecystectomy 1986  . Knee surgery '95 and '97    BILATERAL KNEE  . Finger ganglion cyst excision 2003    RIGHT 3rd finger  . Lasik 2000  . Laparoscopy '98,  '01    endometriosis (Dr. Leo Grosser)  . Tibial tuberoplasty     left   History   Social History  . Marital Status: Single    Spouse Name: N/A    Number of Children: 0  . Years of Education: N/A   Occupational History  . Medical Transportation    Social History Main Topics  . Smoking status: Never Smoker   . Smokeless tobacco: Never Used  . Alcohol Use: Yes     6 drinks a week, beer  . Drug Use: No  . Sexually Active: Not on file   Other Topics Concern  . Not on file   Social History Narrative  . No narrative on file    Current Outpatient Prescriptions on File Prior to Visit  Medication Sig Dispense Refill  . ADVAIR DISKUS 250-50 MCG/DOSE AEPB inhale 1 dose by mouth twice a day as directed  60 each  6  . atorvastatin (LIPITOR) 40 MG tablet Take 1 tablet (40 mg total) by mouth daily.  30 tablet  5  . Calcium Carbonate-Vitamin D (CALCIUM + D) 600-200 MG-UNIT TABS Take 2 tablets by mouth daily.        . cetirizine (ZYRTEC) 10 MG tablet Take 10 mg by mouth daily.        . citalopram (CELEXA) 40 MG tablet take 1 tablet by  mouth once daily  30 tablet  0  . diclofenac (VOLTAREN) 75 MG EC tablet Take 1 tablet (75 mg total) by mouth 2 (two) times daily.  60 tablet  5  . ferrous sulfate 325 (65 FE) MG tablet Take 325 mg by mouth daily.      . fish oil-omega-3 fatty acids 1000 MG capsule Take 3 g by mouth daily.       . fluocinonide cream (LIDEX) 0.05 % apply to affected area OF scalp if needed for rash  45 g  1  . fluticasone (FLONASE) 50 MCG/ACT nasal spray place 1 to 2 sprays into each nostril once daily  16 g  0  . lisinopril-hydrochlorothiazide (PRINZIDE,ZESTORETIC) 20-25 MG per tablet Take 1 tablet by mouth daily.  30 tablet  5  . medroxyPROGESTERone (PROVERA) 10 MG tablet Take 20 mg by mouth daily.       . metFORMIN (GLUCOPHAGE-XR) 500 MG 24 hr tablet TAKE 2 TABLETS BY MOUTH EVERY MORNING AND 1 TABLET EVERY EVENING  90 tablet  0  . Multiple Vitamins-Minerals (MULTIVITAMIN WITH  MINERALS) tablet Take 1 tablet by mouth daily.        Marland Kitchen omeprazole (PRILOSEC) 20 MG capsule Take 20 mg by mouth daily.        . vitamin B-12 (CYANOCOBALAMIN) 1000 MCG tablet Take 1,000 mcg by mouth daily.        Marland Kitchen zinc gluconate 50 MG tablet Take 50 mg by mouth daily.         No Known Allergies  ROS:  Denies fevers, URI symptoms, shortness of breath.  Asthma has been controlled. Denies chest pain, GI complaints (except as noted in HPI).  Denies skin rashes (just lesion RLE/wart).  Had a recently discolored area of skin/rash, which is resolving. +weight gain--stress of mother's surgery for breast cancer, and also some decrease in activity due to stress fractures.  Moods overall are okay.    PHYSICAL EXAM: BP 138/64  Pulse 80  Ht 5' 4"  (1.626 m)  Wt 270 lb (122.471 kg)  BMI 46.35 kg/m2 Well developed, pleasant obese female in no distress HEENT:  Conjunctiva clear, PERRL.  OP clear.  Sinuses nontender Neck: no lymphadenopathy, thyromegaly or mass Heart: regular rate and rhythm Lungs: clear bilaterally Abdomen: obese, soft, nontender, no mass Extremities: no edema, 2+ pulse.  Normal sensation. Wart right upper lateral portion of lower leg Psych: normal mood, affect, hygiene and grooming  Lab Results  Component Value Date   HGBA1C 5.8* 01/22/2012   Lab Results  Component Value Date   CHOL 161 01/22/2012   HDL 40 01/22/2012   LDLCALC 92 01/22/2012   TRIG 147 01/22/2012   CHOLHDL 4.0 01/22/2012    Lab Results  Component Value Date   WBC 8.5 01/22/2012   HGB 11.1* 01/22/2012   HCT 33.5* 01/22/2012   MCV 88.6 01/22/2012   PLT 327 01/22/2012   ASSESSMENT/PLAN: 1. HYPERTENSION    2. INSULIN RESISTANCE SYNDROME  metFORMIN (GLUCOPHAGE-XR) 500 MG 24 hr tablet  3. Mixed hyperlipidemia  atorvastatin (LIPITOR) 40 MG tablet  4. GERD    5. ANXIETY  citalopram (CELEXA) 40 MG tablet  6. Unspecified essential hypertension  lisinopril-hydrochlorothiazide (PRINZIDE,ZESTORETIC) 20-25 MG per tablet    7. OA (osteoarthritis) of knee      HTN--controlled.  Encouraged weight loss  Obesity--offered referral to nutritionist.  She plans to see therapist to help with her emotional eating. Reviewed diet, exercise, portion control.  Discussed OA, motivational apps such as  MyFitnessPal or journaling.  Discussed separation of pleasure/enjoyment/emotions from eating, thinking of food as fuel.  Discussed healthy snacks. Encouraged weight loss to help with her knee OA.  Continue with labs q6 months due to chronic NSAID use.  GERD--given obesity, and asthma, recommend she continue on daily prilosec.  Anemia--stable.  May continue iron, or decrease in frequency given lack of any bleeding  Wart--try OTC salicylic acid, and return for freezing if not resolving.  30 minutes visit, more than 1/2 counseling.

## 2012-01-27 NOTE — Patient Instructions (Signed)
Try topical medications for wart such as salicylic acid (ie Compound W).  It may take over a month.  If it isn't resolving, return for a freezing treatment.  Continue exercise.  Try and watch portion size and choose healthy snacks.  Let me know if you are interested in being referred to a nutritionist to help with meal planning.  Agree with seeing counselor also for help with emotions/emotional eating.

## 2012-02-10 ENCOUNTER — Other Ambulatory Visit: Payer: Self-pay | Admitting: Family Medicine

## 2012-03-01 ENCOUNTER — Other Ambulatory Visit: Payer: Self-pay | Admitting: Critical Care Medicine

## 2012-03-23 ENCOUNTER — Other Ambulatory Visit: Payer: Self-pay | Admitting: Obstetrics and Gynecology

## 2012-03-23 DIAGNOSIS — Z1231 Encounter for screening mammogram for malignant neoplasm of breast: Secondary | ICD-10-CM

## 2012-04-04 ENCOUNTER — Ambulatory Visit (INDEPENDENT_AMBULATORY_CARE_PROVIDER_SITE_OTHER): Payer: BC Managed Care – PPO | Admitting: Obstetrics and Gynecology

## 2012-04-04 ENCOUNTER — Encounter: Payer: Self-pay | Admitting: Obstetrics and Gynecology

## 2012-04-04 VITALS — BP 138/68 | Ht 64.5 in | Wt 271.0 lb

## 2012-04-04 DIAGNOSIS — N809 Endometriosis, unspecified: Secondary | ICD-10-CM

## 2012-04-04 MED ORDER — MEDROXYPROGESTERONE ACETATE 10 MG PO TABS: ORAL_TABLET | ORAL | Status: DC

## 2012-04-04 NOTE — Progress Notes (Signed)
ANNUAL:  Last Pap: 03/31/2010 WNL: Yes Regular Periods:no Contraception: Abstinence   Monthly Breast exam:yes Tetanus<35yr:yes Nl.Bladder Function:yes Daily BMs:yes Healthy Diet:yes Calcium:yes Mammogram:yes Date of Mammogram: 04/27/2011 Exercise:yes Have often Exercise: 4 times weekly Seatbelt: yes Abuse at home: no Stressful work:no Sigmoid-colonoscopy: 2005 Bone Density: No [place] [facility]  Subjective:    Susan SALVADORis a 46y.o. female No obstetric history on file. who presents for annual exam.  The patient has no complaints today.   The following portions of the patient's history were reviewed and updated as appropriate: allergies, current medications, past family history, past medical history, past social history, past surgical history and problem list.  Review of Systems Pertinent items are noted in HPI. Gastrointestinal:No change in bowel habits, no abdominal pain, no rectal bleeding Genitourinary:negative for dysuria, frequency, hematuria, nocturia and urinary incontinence    Objective:     BP 138/68  Ht 5' 4.5" (1.638 m)  Wt 271 lb (122.925 kg)  BMI 45.80 kg/m2  Weight:  Wt Readings from Last 1 Encounters:  04/04/12 271 lb (122.925 kg)     BMI: Body mass index is 45.80 kg/(m^2). General Appearance: Alert, appropriate appearance for age. No acute distress HEENT: Grossly normal Neck / Thyroid: Supple, no masses, nodes or enlargement Lungs: clear to auscultation bilaterally Back: No CVA tenderness Breast Exam: No masses or nodes.No dimpling, nipple retraction or discharge. Cardiovascular: Regular rate and rhythm. S1, S2, no murmur Gastrointestinal: Soft, non-tender, no masses or organomegaly Pelvic Exam: External genitalia: normal general appearance Vaginal: normal rugae Cervix: normal appearance Adnexa: non palpable Uterus: doesnt feel enlarged Exam limited by body  habitus Rectovaginal: normal rectal, no masses Lymphatic Exam: Non-palpable nodes in neck, clavicular, axillary, or inguinal regions Skin: no rash or abnormalities Neurologic: Normal gait and speech, no tremor  Psychiatric: Alert and oriented, appropriate affect.    Urinalysis:Not done    Assessment:    Endometriosis controlled with Provera 20 mg daily.  Plan:   Mammogram Mom with hx breast ca pap smear due 2014 Provera 20 mg daily   Call if bleeding occurs for increase in Provera and ultrasound for endometrial thickness  Follow-up:  for annual exam   VKendall FlackMD

## 2012-04-18 ENCOUNTER — Encounter: Payer: Self-pay | Admitting: *Deleted

## 2012-04-27 ENCOUNTER — Ambulatory Visit (HOSPITAL_COMMUNITY)
Admission: RE | Admit: 2012-04-27 | Discharge: 2012-04-27 | Disposition: A | Discharge status: Home / Self Care | Payer: BC Managed Care – PPO | Source: Ambulatory Visit | Attending: Obstetrics and Gynecology | Admitting: Obstetrics and Gynecology

## 2012-04-27 DIAGNOSIS — Z1231 Encounter for screening mammogram for malignant neoplasm of breast: Secondary | ICD-10-CM | POA: Insufficient documentation

## 2012-05-23 ENCOUNTER — Other Ambulatory Visit: Payer: Self-pay | Admitting: Critical Care Medicine

## 2012-08-01 ENCOUNTER — Other Ambulatory Visit: Payer: Self-pay | Admitting: Family Medicine

## 2012-08-03 ENCOUNTER — Ambulatory Visit (INDEPENDENT_AMBULATORY_CARE_PROVIDER_SITE_OTHER): Payer: BC Managed Care – PPO | Admitting: Family Medicine

## 2012-08-03 ENCOUNTER — Encounter: Payer: Self-pay | Admitting: Family Medicine

## 2012-08-03 VITALS — BP 118/64 | HR 76 | Ht 65.0 in | Wt 263.0 lb

## 2012-08-03 DIAGNOSIS — E8881 Metabolic syndrome: Secondary | ICD-10-CM

## 2012-08-03 DIAGNOSIS — E785 Hyperlipidemia, unspecified: Secondary | ICD-10-CM

## 2012-08-03 DIAGNOSIS — Z6841 Body Mass Index (BMI) 40.0 and over, adult: Secondary | ICD-10-CM

## 2012-08-03 DIAGNOSIS — E348 Other specified endocrine disorders: Secondary | ICD-10-CM

## 2012-08-03 DIAGNOSIS — E782 Mixed hyperlipidemia: Secondary | ICD-10-CM

## 2012-08-03 DIAGNOSIS — F411 Generalized anxiety disorder: Secondary | ICD-10-CM

## 2012-08-03 DIAGNOSIS — M179 Osteoarthritis of knee, unspecified: Secondary | ICD-10-CM

## 2012-08-03 DIAGNOSIS — Z79899 Other long term (current) drug therapy: Secondary | ICD-10-CM

## 2012-08-03 DIAGNOSIS — I10 Essential (primary) hypertension: Secondary | ICD-10-CM

## 2012-08-03 DIAGNOSIS — IMO0002 Reserved for concepts with insufficient information to code with codable children: Secondary | ICD-10-CM

## 2012-08-03 DIAGNOSIS — D649 Anemia, unspecified: Secondary | ICD-10-CM

## 2012-08-03 DIAGNOSIS — M1710 Unilateral primary osteoarthritis, unspecified knee: Secondary | ICD-10-CM

## 2012-08-03 LAB — LIPID PANEL
Cholesterol: 135 mg/dL (ref 0–200)
HDL: 35 mg/dL — ABNORMAL LOW (ref >39)
LDL Cholesterol: 63 mg/dL (ref 0–99)
Triglycerides: 187 mg/dL — ABNORMAL HIGH (ref <150)

## 2012-08-03 LAB — CBC WITH DIFFERENTIAL/PLATELET
Eosinophils Relative: 3 % (ref 0–5)
HCT: 32.4 % — ABNORMAL LOW (ref 36.0–46.0)
Hemoglobin: 11.1 g/dL — ABNORMAL LOW (ref 12.0–15.0)
Lymphocytes Relative: 30 % (ref 12–46)
Lymphs Abs: 2.6 10*3/uL (ref 0.7–4.0)
MCV: 88.3 fL (ref 78.0–100.0)
Monocytes Absolute: 0.7 10*3/uL (ref 0.1–1.0)
Monocytes Relative: 8 % (ref 3–12)
Neutro Abs: 5.2 10*3/uL (ref 1.7–7.7)
RBC: 3.67 MIL/uL — ABNORMAL LOW (ref 3.87–5.11)
WBC: 8.8 10*3/uL (ref 4.0–10.5)

## 2012-08-03 LAB — COMPREHENSIVE METABOLIC PANEL
ALT: 25 U/L (ref 0–35)
BUN: 18 mg/dL (ref 6–23)
CO2: 21 mEq/L (ref 19–32)
Creat: 1.04 mg/dL (ref 0.50–1.10)
Glucose, Bld: 82 mg/dL (ref 70–99)
Total Bilirubin: 0.4 mg/dL (ref 0.3–1.2)

## 2012-08-03 LAB — IRON: Iron: 71 ug/dL (ref 42–145)

## 2012-08-03 LAB — MAGNESIUM: Magnesium: 1.8 mg/dL (ref 1.5–2.5)

## 2012-08-03 MED ORDER — DICLOFENAC SODIUM 75 MG PO TBEC: 75.0000 mg | DELAYED_RELEASE_TABLET | Freq: Two times a day (BID) | ORAL | Status: DC

## 2012-08-03 MED ORDER — CITALOPRAM HYDROBROMIDE 40 MG PO TABS: 40.0000 mg | ORAL_TABLET | Freq: Every day | ORAL | Status: DC

## 2012-08-03 MED ORDER — LISINOPRIL-HYDROCHLOROTHIAZIDE 20-25 MG PO TABS: 1.0000 | ORAL_TABLET | Freq: Every day | ORAL | Status: DC

## 2012-08-03 NOTE — Patient Instructions (Signed)
Consider at some point, when things are good, decreasing citalopram to 53m daily.  If depression/anxiety increases, then go back to 457m  If after 1-2 months moods are still great, consider tapering down further.   Continue all of your current medications.

## 2012-08-03 NOTE — Progress Notes (Signed)
Chief Complaint  Patient presents with  . Hyperlipidemia    fasting med check, did take meds this am and had a cup of hot tea with artificial sweetener between 8-9am.   HPI:  Hypertension follow-up: Blood pressures aren't checked elsewhere. Denies dizziness, headaches, chest pain, edema. Denies side effects of medications. She has a dry cough, which started prior to lisinopril and seems to be related to reflux, as cough is worse if she doesn't take prilosec.  Hasn't been bothered by cough recently.  PCOS/insulin resistance.  Saw ophtho a few weeks ago, and all was fine.  Had a change in vision, got new glasses.  Doesn't check sugars.  Exercise 4 days/week, and has lost weight since last visit (at which time she had gained related to decreased activity from stress fractures in foot). No polydipsia or polyuria.  GERD--takes Prilosec daily. Can skip it for a few days if she avoids certain foods, including dairy. Cough recurs when she stops the prilosec.  She takes the Prilosec daily, and she has questions about whether she will ever be able to come off this medication.  Anemia--has been off iron since July.  Colonoscopy was in 2007, and recalls being told she is good for 10 years.  Denies vaginal bleeding, rare hemorrhoidal bleeding (once in the last month).  Has had chronic, mild anemia (hg 11), normocytic.  Denies fatigue, dizziness, shortness of breath.  Depression/anxiety (mostly anxiety, depression related to mom's death)--Overall doing well on citalopram.  Dad has some health issues, but local sister is helping out. Has been on antidepressants for many years (5-10 years), has never come off.  OA--L knee.  Needs to take diclofenac BID or else feels overall very achey, and increased knee pain.  Also gets back pain.  Denies abdominal pain.  Asthma and allergies--well controlled on her current regimen.  Past Medical History  Diagnosis Date  . Asthma     (Dr. Joya Gaskins)  . Obesity   .  Hypertension   . GERD (gastroesophageal reflux disease)   . Allergic rhinitis, cause unspecified     cat/dog/ragweed/mold/wool, s/p immunotherapy x 3-4 yrs  . Hyperlipidemia   . OSA (obstructive sleep apnea)     on CPAP (Dr. Gwenette Greet)  . Depression 2006  . Anxiety 2006  . Colon polyps 2007    Dr. Oletta Lamas (she was told repeat in 10 years)  . Hemorrhoid     internal and external  . Endometriosis     Dr. Leo Grosser  . Insulin resistance     Dr. Leo Grosser  . Family history of breast cancer in female     Past Surgical History  Procedure Date  . Cholecystectomy 1986  . Knee surgery '95 and '97    BILATERAL KNEE  . Finger ganglion cyst excision 2003    RIGHT 3rd finger  . Lasik 2000  . Laparoscopy '98, '01    endometriosis (Dr. Leo Grosser)  . Tibial tuberoplasty 90's    left    History   Social History  . Marital Status: Single    Spouse Name: N/A    Number of Children: 0  . Years of Education: N/A   Occupational History  . 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 Smoking status: Never Smoker   . Smokeless tobacco: Never Used  . Alcohol Use: Yes     Comment: 6 drinks a week, beer  . Drug Use: No  . Sexually Active: No   Other Topics Concern  .  Not on file   Social History Narrative   Lives alone, 2 cats.  Sister and father live in [place]    Family History  Problem Relation Age of Onset  . Diabetes Mother   . Hypertension Mother   . Hyperlipidemia Mother   . Arthritis Mother   . Cancer Mother 1    breast cancer  . Breast cancer Mother 75  . Diabetes Father   . Hyperlipidemia Father   . Hypertension Father   . Asthma Father   . Arthritis Father   . Heart disease Father     CHF, hypokinesis  . COPD Father     due to asbestos exposure; former smoker  . Hypertension Sister   . Breast cancer Paternal Aunt     82's  . Cancer Paternal Aunt     breast  . Heart disease Paternal Uncle   . Diabetes Paternal Uncle   . Kidney disease Paternal  Uncle   . Colon cancer Maternal Grandmother     late 55's  . Cancer Maternal Grandmother     colon  . Heart disease Maternal Grandmother     MI in 56's   Current Outpatient Prescriptions on File Prior to Visit  Medication Sig Dispense Refill  . ADVAIR DISKUS 250-50 MCG/DOSE AEPB inhale 1 dose by mouth twice a day as directed  60 each  6  . atorvastatin (LIPITOR) 40 MG tablet Take 1 tablet (40 mg total) by mouth daily.  30 tablet  5  . Calcium Carbonate-Vitamin D (CALCIUM + D) 600-200 MG-UNIT TABS Take 2 tablets by mouth daily.        . cetirizine (ZYRTEC) 10 MG tablet Take 10 mg by mouth daily.        . citalopram (CELEXA) 40 MG tablet Take 1 tablet (40 mg total) by mouth daily.  30 tablet  5  . diclofenac (VOLTAREN) 75 MG EC tablet Take 1 tablet (75 mg total) by mouth 2 (two) times daily.  60 tablet  5  . fish oil-omega-3 fatty acids 1000 MG capsule Take 3 g by mouth daily.       . fluticasone (FLONASE) 50 MCG/ACT nasal spray INSTILL 1 TO 2 SPRAYS INTO EACH NOSTRIL ONCE A DAY  16 g  6  . lisinopril-hydrochlorothiazide (PRINZIDE,ZESTORETIC) 20-25 MG per tablet Take 1 tablet by mouth daily.  30 tablet  5  . medroxyPROGESTERone (PROVERA) 10 MG tablet Take one tablet two times daily  60 tablet  11  . metFORMIN (GLUCOPHAGE-XR) 500 MG 24 hr tablet take 2 tablets by mouth every morning and 1 tablet every evening  90 tablet  0  . Multiple Vitamins-Minerals (MULTIVITAMIN WITH MINERALS) tablet Take 1 tablet by mouth daily.        Marland Kitchen omeprazole (PRILOSEC) 20 MG capsule Take 20 mg by mouth daily.        . vitamin B-12 (CYANOCOBALAMIN) 1000 MCG tablet Take 1,000 mcg by mouth daily.        Marland Kitchen zinc gluconate 50 MG tablet Take 50 mg by mouth daily.        . ferrous sulfate 325 (65 FE) MG tablet Take 325 mg by mouth daily.      . fluocinonide cream (LIDEX) 0.05 % apply to affected area OF scalp if needed for rash  45 g  1   No Known Allergies  ROS:  Wart R lower leg unchanged. Hasn't tried OTC  treatments. Denies fevers, URI symptoms, shortness of breath. Asthma has been  controlled. Denies chest pain, GI complaints. Denies skin rashes (just lesion RLE/wart).  Moods overall are okay. Denies dysuria, bleeding, bruising.  Some occasional nose bleed in dry/cold weather.  PHYSICAL EXAM: BP 118/64  Pulse 76  Ht 5' 5"  (1.651 m)  Wt 263 lb (119.296 kg)  BMI 43.77 kg/m2 Well developed, pleasant obese female in no distress  Neck: no lymphadenopathy, thyromegaly or mass  Heart: regular rate and rhythm, no murmur, rub, gallop Lungs: clear bilaterally with good air movement Abdomen: obese, soft, nontender, no mass  Extremities: no edema, 2+ pulse. Psych: normal mood, affect, hygiene and grooming Neuro: alert and oriented, cranial nerves grossly intact.  Normal gait, strength, sensation.  Lab Results  Component Value Date   HGBA1C 5.5 08/03/2012   ASSESSMENT/PLAN:  1. Insulin resistance syndrome  HgB A1c, Comprehensive metabolic panel, TSH  2. ANXIETY  citalopram (CELEXA) 40 MG tablet  3. HYPERLIPIDEMIA  Lipid panel, Comprehensive metabolic panel  4. HYPERTENSION  Comprehensive metabolic panel  5. INSULIN RESISTANCE SYNDROME    6. Mixed hyperlipidemia    7. Morbid obesity with body mass index of 45.0-49.9 in adult    8. OA (osteoarthritis) of knee    9. Anemia  CBC with Differential, Ferritin, Iron  10. Encounter for long-term (current) use of other medications  Comprehensive metabolic panel, CBC with Differential, TSH, Magnesium  11. Unspecified essential hypertension  lisinopril-hydrochlorothiazide (PRINZIDE,ZESTORETIC) 20-25 MG per tablet   GERD--Risks/benefits of gerd disease and treatment reviewed, especially with chronic NSAID use and her asthma--encouraged continued use, with MVI with calcium, D and magnesium  Anemia--?etiology.  Has been off iron.  Check CBC, iron and ferritin. Cells aren't small and RDW was normal.  Consider adding B12/folate if significant worsening of anemia  and iron studies normal.  Depression/anxiety--discussed potentially tapering down dose, possibly tapering off, as she has never tried this.  Recommend doing this only when stressors are low, and go back up to next highest dose if symptoms recur.  If symptoms recur, plan to continue longterm.  HTN--well controlled.

## 2012-08-04 LAB — FERRITIN: Ferritin: 31 ng/mL (ref 10–291)

## 2012-08-04 MED ORDER — ATORVASTATIN CALCIUM 40 MG PO TABS: 40.0000 mg | ORAL_TABLET | Freq: Every day | ORAL | Status: DC

## 2012-09-13 ENCOUNTER — Other Ambulatory Visit: Payer: Self-pay | Admitting: Family Medicine

## 2013-01-31 ENCOUNTER — Other Ambulatory Visit: Payer: Self-pay | Admitting: Family Medicine

## 2013-01-31 DIAGNOSIS — F411 Generalized anxiety disorder: Secondary | ICD-10-CM

## 2013-01-31 NOTE — Telephone Encounter (Signed)
Done.  (has appt 8/18)

## 2013-01-31 NOTE — Telephone Encounter (Signed)
Is this okay to fill?

## 2013-02-10 ENCOUNTER — Other Ambulatory Visit: Payer: Self-pay | Admitting: Family Medicine

## 2013-02-13 ENCOUNTER — Ambulatory Visit (INDEPENDENT_AMBULATORY_CARE_PROVIDER_SITE_OTHER): Payer: BC Managed Care – PPO | Admitting: Family Medicine

## 2013-02-13 ENCOUNTER — Encounter: Payer: Self-pay | Admitting: Family Medicine

## 2013-02-13 VITALS — BP 114/68 | HR 72 | Ht 64.25 in | Wt 257.0 lb

## 2013-02-13 DIAGNOSIS — Z79899 Other long term (current) drug therapy: Secondary | ICD-10-CM

## 2013-02-13 DIAGNOSIS — M1710 Unilateral primary osteoarthritis, unspecified knee: Secondary | ICD-10-CM

## 2013-02-13 DIAGNOSIS — E119 Type 2 diabetes mellitus without complications: Secondary | ICD-10-CM

## 2013-02-13 DIAGNOSIS — F411 Generalized anxiety disorder: Secondary | ICD-10-CM

## 2013-02-13 DIAGNOSIS — E782 Mixed hyperlipidemia: Secondary | ICD-10-CM

## 2013-02-13 DIAGNOSIS — I10 Essential (primary) hypertension: Secondary | ICD-10-CM

## 2013-02-13 DIAGNOSIS — K219 Gastro-esophageal reflux disease without esophagitis: Secondary | ICD-10-CM

## 2013-02-13 DIAGNOSIS — M5412 Radiculopathy, cervical region: Secondary | ICD-10-CM

## 2013-02-13 LAB — POCT GLYCOSYLATED HEMOGLOBIN (HGB A1C): Hemoglobin A1C: 6.2

## 2013-02-13 MED ORDER — ATORVASTATIN CALCIUM 40 MG PO TABS: 40.0000 mg | ORAL_TABLET | Freq: Every day | ORAL | Status: DC

## 2013-02-13 MED ORDER — DICLOFENAC SODIUM 75 MG PO TBEC: 75.0000 mg | DELAYED_RELEASE_TABLET | Freq: Two times a day (BID) | ORAL | Status: DC

## 2013-02-13 MED ORDER — LISINOPRIL-HYDROCHLOROTHIAZIDE 20-25 MG PO TABS: 1.0000 | ORAL_TABLET | Freq: Every day | ORAL | Status: DC

## 2013-02-13 NOTE — Patient Instructions (Addendum)
Cervical radiculopathy--short-lived, intermittent, possibly related to muscle spasm.  Consider following-up with [facility] if symptoms persist/worsen. Ensure good posture and neck stretches.  Continue current medications.  You are back from vacation--time to get more serious re: diet, and weight loss.  Consider getting pneumovax (pneumonia vaccine)--recommended for patients with diabetes, asthma

## 2013-02-13 NOTE — Progress Notes (Signed)
Chief Complaint  Patient presents with  . Diabetes    fasting med check.   She is complaining of a trigger point in her right shoulder.  She had a friend "work on it" with massage, using her elbow, and afterwards she noted tingling and itchy skin into her right arm.  Tingling and itching in arm have persisted, positional (not constant, just in certain positions, but she can't recall which).  She notices it when arms are swinging when on treadmill.  Can occur sitting, or with activity.  Lasts for just a few minutes.  Denies any weakness.   Hypertension follow-up: Blood pressures aren't checked elsewhere. Denies dizziness, headaches, chest pain, edema. Denies side effects of medications. She has a dry cough, which started prior to lisinopril and seems to be related to reflux, as cough is worse if she doesn't take prilosec. Had some cough/reflux while on vacation, when she hadn't taken Prilosec.  PCOS/insulin resistance. Saw ophtho in February and all was fine.  Doesn't check sugars. Exercise 4 days/week (boot camp and cardio). No polydipsia or polyuria.  She was on vacation last week, and admits to a week of "bad eating".  She thinks she may have gained weight from vacation.  Our scales show she is down 6 pounds from her last visit.  GERD--takes Prilosec daily. Can skip it for a few days if she avoids certain foods, including dairy. Had recurrent reflux while on vacation and missed Prilosec for a day (but diet was poor). Cough recurs when she stops the prilosec.   Anemia--has been off iron since July 2013 and repeat CBC and iron studies remained normal at last check in February. Colonoscopy was in 2007, and recalls being told she is good for 10 years. Denies vaginal bleeding, rare hemorrhoidal bleeding. Has had chronic, mild anemia (hg 11), normocytic. Denies fatigue, dizziness, shortness of breath.   Depression/anxiety (mostly anxiety, depression related to mom's death)--Overall doing well on  citalopram. Dad has some health issues, but siblings are helping out (pt is primary--manages his finances, takes him to doctors).  Increased stress with dad's recent hospitalization with bilateral PE's, recovering now. Has been on antidepressants for many years (5-10 years), has never come off. Very rare xanax use.  OA--L knee. Needs to take diclofenac BID or else feels overall very achey, and increased knee pain. Also gets back pain. Denies abdominal pain.   Asthma and allergies--well controlled on her current regimen.  Past Medical History  Diagnosis Date  . Asthma     (Dr. Joya Gaskins)  . Obesity   . Hypertension   . GERD (gastroesophageal reflux disease)   . Allergic rhinitis, cause unspecified     cat/dog/ragweed/mold/wool, s/p immunotherapy x 3-4 yrs  . Hyperlipidemia   . OSA (obstructive sleep apnea)     on CPAP (Dr. Gwenette Greet)  . Depression 2006  . Anxiety 2006  . Colon polyps 2007    Dr. Oletta Lamas (she was told repeat in 10 years)  . Hemorrhoid     internal and external  . Endometriosis     Dr. Leo Grosser  . Insulin resistance     Dr. Leo Grosser  . Family history of breast cancer in female    Past Surgical History  Procedure Laterality Date  . Cholecystectomy  1986  . Knee surgery  '95 and '97    BILATERAL KNEE  . Finger ganglion cyst excision  2003    RIGHT 3rd finger  . Lasik  2000  . Laparoscopy  '98, '01  endometriosis (Dr. Leo Grosser)  . Tibial tuberoplasty  90's    left   History   Social History  . Marital Status: Single    Spouse Name: N/A    Number of Children: 0  . Years of Education: N/A   Occupational History  . 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 Smoking status: Never Smoker   . Smokeless tobacco: Never Used  . Alcohol Use: Yes     Comment: 6 drinks a week, beer  . Drug Use: No  . Sexual Activity: No   Other Topics Concern  . Not on file   Social History Narrative   Lives alone, 2 cats.  Sister and father live in [place]    Family History  Problem Relation Age of Onset  . Diabetes Mother   . Hypertension Mother   . Hyperlipidemia Mother   . Arthritis Mother   . Cancer Mother 15    breast cancer  . Breast cancer Mother 76  . Diabetes Father   . Hyperlipidemia Father   . Hypertension Father   . Asthma Father   . Arthritis Father   . Heart disease Father     CHF, hypokinesis  . COPD Father     due to asbestos exposure; former smoker  . Pulmonary embolism Father   . Hypertension Sister   . Breast cancer Paternal Aunt     88's  . Cancer Paternal Aunt     breast  . Heart disease Paternal Uncle   . Diabetes Paternal Uncle   . Kidney disease Paternal Uncle   . Colon cancer Maternal Grandmother     late 32's  . Cancer Maternal Grandmother     colon  . Heart disease Maternal Grandmother     MI in 38's    Current outpatient prescriptions:ADVAIR DISKUS 250-50 MCG/DOSE AEPB, inhale 1 dose by mouth twice a day as directed, Disp: 60 each, Rfl: 6;  atorvastatin (LIPITOR) 40 MG tablet, Take 1 tablet (40 mg total) by mouth daily., Disp: 30 tablet, Rfl: 5;  Calcium Carbonate-Vitamin D (CALCIUM + D) 600-200 MG-UNIT TABS, Take 2 tablets by mouth daily.  , Disp: , Rfl: ;  cetirizine (ZYRTEC) 10 MG tablet, Take 10 mg by mouth daily.  , Disp: , Rfl:  citalopram (CELEXA) 40 MG tablet, take 1 tablet by mouth once daily, Disp: 30 tablet, Rfl: 5;  diclofenac (VOLTAREN) 75 MG EC tablet, Take 1 tablet (75 mg total) by mouth 2 (two) times daily., Disp: 60 tablet, Rfl: 5;  fish oil-omega-3 fatty acids 1000 MG capsule, Take 3 g by mouth daily. , Disp: , Rfl: ;  fluticasone (FLONASE) 50 MCG/ACT nasal spray, INSTILL 1 TO 2 SPRAYS INTO EACH NOSTRIL ONCE A DAY, Disp: 16 g, Rfl: 6 lisinopril-hydrochlorothiazide (PRINZIDE,ZESTORETIC) 20-25 MG per tablet, Take 1 tablet by mouth daily., Disp: 30 tablet, Rfl: 5;  medroxyPROGESTERone (PROVERA) 10 MG tablet, Take one tablet two times daily, Disp: 60 tablet, Rfl: 11;  metFORMIN  (GLUCOPHAGE-XR) 500 MG 24 hr tablet, take 2 tablets every morning and 1 tablet every evening, Disp: 90 tablet, Rfl: 4 Multiple Vitamins-Minerals (MULTIVITAMIN WITH MINERALS) tablet, Take 1 tablet by mouth daily.  , Disp: , Rfl: ;  omeprazole (PRILOSEC) 20 MG capsule, Take 20 mg by mouth daily.  , Disp: , Rfl: ;  vitamin B-12 (CYANOCOBALAMIN) 1000 MCG tablet, Take 1,000 mcg by mouth daily.  , Disp: , Rfl: ;  zinc gluconate 50 MG tablet, Take 50 mg by  mouth daily.  , Disp: , Rfl: ;  ferrous sulfate 325 (65 FE) MG tablet, Take 325 mg by mouth daily., Disp: , Rfl:  fluocinonide cream (LIDEX) 0.05 %, apply to affected area OF scalp if needed for rash, Disp: 45 g, Rfl: 1  No Known Allergies  ROS:  Denies headaches, dizziness, neck pain, chest pain, shortness of breath, edema, abdominal pain, bleeding/bruising.  GERD as per HPI.  No URI symptoms.  Allergies/asthma well controlled.  No rashes.  See HPI  PHYSICAL EXAM: BP 114/68  Pulse 72  Ht 5' 4.25" (1.632 m)  Wt 257 lb (116.574 kg)  BMI 43.77 kg/m2  Well developed, pleasant obese female in no distress  Neck: no lymphadenopathy, thyromegaly or mass.  No trigger point or tenderness on today's exam.  c-spine nontender Heart: regular rate and rhythm, no murmur, rub, gallop  Lungs: clear bilaterally with good air movement  Abdomen: obese, soft, nontender, no mass  Extremities: no edema, 2+ pulse.  Psych: normal mood, affect, hygiene and grooming  Neuro: alert and oriented, cranial nerves grossly intact. Normal gait, strength, sensation. Normal monofilament exam/diabetic foot exam   Lab Results  Component Value Date   HGBA1C 6.2 02/13/2013   ASSESSMENT/PLAN:  Type II or unspecified type diabetes mellitus without mention of complication, not stated as uncontrolled - encouraged daily exercise, proper diet, and weight loss - Plan: HgB A1c, Comprehensive metabolic panel, [facility] Diabetes Foot Exam  Mixed hyperlipidemia - Plan: Lipid panel, atorvastatin  (LIPITOR) 40 MG tablet  HYPERTENSION - controlled - Plan: Comprehensive metabolic panel  GERD - controlled with Prilosec.  weight loss encouraged  ANXIETY - controlled.  continue citalopram  Encounter for long-term (current) use of other medications - Plan: Comprehensive metabolic panel  Unspecified essential hypertension - Plan: lisinopril-hydrochlorothiazide (PRINZIDE,ZESTORETIC) 20-25 MG per tablet  Arthritis of knee - Plan: diclofenac (VOLTAREN) 75 MG EC tablet  Cervical radiculopathy - vs pinched nerve from muscle spasm.  Asymptomatic today.  discussed proper posture/ergodynamics.  consider f/u with [facility] for [facility] if persists   Recommended pneumovax--declined today.  Flu shot not yet available.  Reminded to get this fall. Readdress pneumovax at f/u in 6 months

## 2013-02-14 LAB — COMPREHENSIVE METABOLIC PANEL
Albumin: 4.2 g/dL (ref 3.5–5.2)
BUN: 15 mg/dL (ref 6–23)
Calcium: 9.8 mg/dL (ref 8.4–10.5)
Chloride: 102 mEq/L (ref 96–112)
Glucose, Bld: 78 mg/dL (ref 70–99)
Potassium: 4.3 mEq/L (ref 3.5–5.3)

## 2013-02-14 LAB — LIPID PANEL: Cholesterol: 162 mg/dL (ref 0–200)

## 2013-02-15 ENCOUNTER — Other Ambulatory Visit: Payer: Self-pay | Admitting: *Deleted

## 2013-02-15 DIAGNOSIS — R748 Abnormal levels of other serum enzymes: Secondary | ICD-10-CM

## 2013-02-15 DIAGNOSIS — Z79899 Other long term (current) drug therapy: Secondary | ICD-10-CM

## 2013-02-15 NOTE — Progress Notes (Signed)
Patient aware of results and scheduled her for 02/11/13 for LFT's.

## 2013-02-20 ENCOUNTER — Other Ambulatory Visit: Payer: Self-pay | Admitting: Critical Care Medicine

## 2013-02-20 NOTE — Telephone Encounter (Signed)
Last OV with PW: 10/14/11; was asked to f/u in 1 yr. No pending OV.  Called, spoke with pt.  We have scheduled her to see PW on 03/07/13 at 3:15 at [facility].  Pt aware.  She is also aware I will send advair rx to last until OV, but pls keep OV for further refills.

## 2013-03-07 ENCOUNTER — Encounter: Payer: Self-pay | Admitting: Critical Care Medicine

## 2013-03-07 ENCOUNTER — Ambulatory Visit (INDEPENDENT_AMBULATORY_CARE_PROVIDER_SITE_OTHER): Payer: BC Managed Care – PPO | Admitting: Critical Care Medicine

## 2013-03-07 VITALS — BP 122/70 | HR 78 | Temp 99.0°F | Ht 64.0 in | Wt 260.6 lb

## 2013-03-07 DIAGNOSIS — J45909 Unspecified asthma, uncomplicated: Secondary | ICD-10-CM

## 2013-03-07 DIAGNOSIS — Z23 Encounter for immunization: Secondary | ICD-10-CM

## 2013-03-07 DIAGNOSIS — J4540 Moderate persistent asthma, uncomplicated: Secondary | ICD-10-CM

## 2013-03-07 MED ORDER — FLUTICASONE-SALMETEROL 250-50 MCG/DOSE IN AEPB: INHALATION_SPRAY | RESPIRATORY_TRACT | Status: DC

## 2013-03-07 MED ORDER — FLUTICASONE PROPIONATE 50 MCG/ACT NA SUSP: NASAL | Status: AC

## 2013-03-07 NOTE — Patient Instructions (Addendum)
No change in medications. Return in      6 months A Flu vaccine and pneumonia vaccine was given Return 6 months

## 2013-03-07 NOTE — Assessment & Plan Note (Signed)
Moderate persistent asthma without significant complication stable to improved Plan Minister flu vaccine and Pneumovax Continue Advair Return 6 month

## 2013-03-07 NOTE — Progress Notes (Signed)
Subjective:    Patient ID: Susan Gould, female    DOB: [birth year], 47 y.o.   MRN: [id number]  HPI  47 y.o. WF with moderate persistent asthma   03/07/2013 Chief Complaint  Patient presents with  . Follow-up    pt reports breathing is doing well-- denies any other concerns at this time  No real changes.  No wheezing. No rescue inhaler use. Pt denies any significant sore throat, nasal congestion or excess secretions, fever, chills, sweats, unintended weight loss, pleurtic or exertional chest pain, orthopnea PND, or leg swelling Pt denies any increase in rescue therapy over baseline, denies waking up needing it or having any early am or nocturnal exacerbations of coughing/wheezing/or dyspnea. Pt also denies any obvious fluctuation in symptoms with  weather or environmental change or other alleviating or aggravating factors    PUL ASTHMA HISTORY 03/07/2013 10/14/2011  Symptoms 0-2 days/week 0-2 days/week  Nighttime awakenings 0-2/month 0-2/month  Interference with activity No limitations No limitations  SABA use 0-2 days/wk 0-2 days/wk  Exacerbations requiring oral steroids 0-1 / year 0-1 / year   Past Medical History  Diagnosis Date  . Asthma     (Dr. Joya Gaskins)  . Obesity   . Hypertension   . GERD (gastroesophageal reflux disease)   . Allergic rhinitis, cause unspecified     cat/dog/ragweed/mold/wool, s/p immunotherapy x 3-4 yrs  . Hyperlipidemia   . OSA (obstructive sleep apnea)     on CPAP (Dr. Gwenette Greet)  . Depression 2006  . Anxiety 2006  . Colon polyps 2007    Dr. Oletta Lamas (she was told repeat in 10 years)  . Hemorrhoid     internal and external  . Endometriosis     Dr. Leo Grosser  . Insulin resistance     Dr. Leo Grosser  . Family history of breast cancer in female      Family History  Problem Relation Age of Onset  . Diabetes Mother   . Hypertension Mother   . Hyperlipidemia Mother   . Arthritis Mother   . Cancer Mother 51    breast cancer  . Breast cancer Mother  89  . Diabetes Father   . Hyperlipidemia Father   . Hypertension Father   . Asthma Father   . Arthritis Father   . Heart disease Father     CHF, hypokinesis  . COPD Father     due to asbestos exposure; former smoker  . Pulmonary embolism Father   . Hypertension Sister   . Breast cancer Paternal Aunt     62's  . Cancer Paternal Aunt     breast  . Heart disease Paternal Uncle   . Diabetes Paternal Uncle   . Kidney disease Paternal Uncle   . Colon cancer Maternal Grandmother     late 81's  . Cancer Maternal Grandmother     colon  . Heart disease Maternal Grandmother     MI in 80's     History   Social History  . Marital Status: Single    Spouse Name: N/A    Number of Children: 0  . Years of Education: N/A   Occupational History  . 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 Smoking status: Never Smoker   . Smokeless tobacco: Never Used  . Alcohol Use: Yes     Comment: 6 drinks a week, beer  . Drug Use: No  . Sexual Activity: No   Other Topics Concern  . Not on file  Social History Narrative   Lives alone, 2 cats.  Sister and father live in [place]     No Known Allergies   Outpatient Prescriptions Prior to Visit  Medication Sig Dispense Refill  . atorvastatin (LIPITOR) 40 MG tablet Take 1 tablet (40 mg total) by mouth daily.  30 tablet  5  . Calcium Carbonate-Vitamin D (CALCIUM + D) 600-200 MG-UNIT TABS Take 2 tablets by mouth daily.        . cetirizine (ZYRTEC) 10 MG tablet Take 10 mg by mouth daily.        . citalopram (CELEXA) 40 MG tablet take 1 tablet by mouth once daily  30 tablet  5  . diclofenac (VOLTAREN) 75 MG EC tablet Take 1 tablet (75 mg total) by mouth 2 (two) times daily.  60 tablet  5  . fish oil-omega-3 fatty acids 1000 MG capsule Take 3 g by mouth daily.       . fluocinonide cream (LIDEX) 0.05 % apply to affected area OF scalp if needed for rash  45 g  1  . lisinopril-hydrochlorothiazide (PRINZIDE,ZESTORETIC) 20-25 MG per  tablet Take 1 tablet by mouth daily.  30 tablet  5  . medroxyPROGESTERone (PROVERA) 10 MG tablet Take one tablet two times daily  60 tablet  11  . metFORMIN (GLUCOPHAGE-XR) 500 MG 24 hr tablet take 2 tablets every morning and 1 tablet every evening  90 tablet  4  . Multiple Vitamins-Minerals (MULTIVITAMIN WITH MINERALS) tablet Take 1 tablet by mouth daily.        Marland Kitchen omeprazole (PRILOSEC) 20 MG capsule Take 20 mg by mouth daily.        . vitamin B-12 (CYANOCOBALAMIN) 1000 MCG tablet Take 1,000 mcg by mouth daily.        Marland Kitchen zinc gluconate 50 MG tablet Take 50 mg by mouth daily.        Marland Kitchen ADVAIR DISKUS 250-50 MCG/DOSE AEPB inhale 1 dose by mouth twice a day as directed  60 each  0  . fluticasone (FLONASE) 50 MCG/ACT nasal spray INSTILL 1 TO 2 SPRAYS INTO EACH NOSTRIL ONCE A DAY  16 g  6   No facility-administered medications prior to visit.      Review of Systems  Constitutional:   No  weight loss, night sweats,  Fevers, chills, fatigue, lassitude. HEENT:   No headaches,  Difficulty swallowing,  Tooth/dental problems,  Sore throat,                No sneezing, itching, ear ache, nasal congestion, post nasal drip,   CV:  No chest pain,  Orthopnea, PND, swelling in lower extremities, anasarca, dizziness, palpitations  GI  No heartburn, indigestion, abdominal pain, nausea, vomiting, diarrhea, change in bowel habits, loss of appetite  Resp: No shortness of breath with exertion or at rest.  No excess mucus, no productive cough,  No non-productive cough,  No coughing up of blood.  No change in color of mucus.  No wheezing.  No chest wall deformity  Skin: no rash or lesions.  GU: no dysuria, change in color of urine, no urgency or frequency.  No flank pain.  MS:  No joint pain or swelling.  No decreased range of motion.  No back pain.  Psych:  No change in mood or affect. No depression or anxiety.  No memory loss.     Objective:   Physical Exam   Filed Vitals:   03/07/13 1520  BP: 122/70   Pulse: 78  Temp: 99 F (37.2 C)  TempSrc: Oral  Height: 5' 4"  (1.626 m)  Weight: 260 lb 9.6 oz (118.207 kg)  SpO2: 96%    Gen: Pleasant, well-nourished, in no distress,  normal affect  ENT: No lesions,  mouth clear,  oropharynx clear, no postnasal drip  Neck: No JVD, no TMG, no carotid bruits  Lungs: No use of accessory muscles, no dullness to percussion, clear without rales or rhonchi  Cardiovascular: RRR, heart sounds normal, no murmur or gallops, no peripheral edema  Abdomen: soft and NT, no HSM,  BS normal  Musculoskeletal: No deformities, no cyanosis or clubbing  Neuro: alert, non focal  Skin: Warm, no lesions or rashes         Assessment & Plan:   Moderate persistent asthma in adult without complication Moderate persistent asthma without significant complication stable to improved Plan Minister flu vaccine and Pneumovax Continue Advair Return 6 month    Updated Medication List Outpatient Encounter Prescriptions as of 03/07/2013  Medication Sig Dispense Refill  . atorvastatin (LIPITOR) 40 MG tablet Take 1 tablet (40 mg total) by mouth daily.  30 tablet  5  . Calcium Carbonate-Vitamin D (CALCIUM + D) 600-200 MG-UNIT TABS Take 2 tablets by mouth daily.        . cetirizine (ZYRTEC) 10 MG tablet Take 10 mg by mouth daily.        . citalopram (CELEXA) 40 MG tablet take 1 tablet by mouth once daily  30 tablet  5  . diclofenac (VOLTAREN) 75 MG EC tablet Take 1 tablet (75 mg total) by mouth 2 (two) times daily.  60 tablet  5  . fish oil-omega-3 fatty acids 1000 MG capsule Take 3 g by mouth daily.       . fluocinonide cream (LIDEX) 0.05 % apply to affected area OF scalp if needed for rash  45 g  1  . fluticasone (FLONASE) 50 MCG/ACT nasal spray INSTILL 1 TO 2 SPRAYS INTO EACH NOSTRIL ONCE A DAY  16 g  11  . Fluticasone-Salmeterol (ADVAIR DISKUS) 250-50 MCG/DOSE AEPB inhale 1 dose by mouth twice a day as directed  60 each  11  . lisinopril-hydrochlorothiazide  (PRINZIDE,ZESTORETIC) 20-25 MG per tablet Take 1 tablet by mouth daily.  30 tablet  5  . medroxyPROGESTERone (PROVERA) 10 MG tablet Take one tablet two times daily  60 tablet  11  . metFORMIN (GLUCOPHAGE-XR) 500 MG 24 hr tablet take 2 tablets every morning and 1 tablet every evening  90 tablet  4  . Multiple Vitamins-Minerals (MULTIVITAMIN WITH MINERALS) tablet Take 1 tablet by mouth daily.        Marland Kitchen omeprazole (PRILOSEC) 20 MG capsule Take 20 mg by mouth daily.        . vitamin B-12 (CYANOCOBALAMIN) 1000 MCG tablet Take 1,000 mcg by mouth daily.        Marland Kitchen zinc gluconate 50 MG tablet Take 50 mg by mouth daily.        . [DISCONTINUED] ADVAIR DISKUS 250-50 MCG/DOSE AEPB inhale 1 dose by mouth twice a day as directed  60 each  0  . [DISCONTINUED] fluticasone (FLONASE) 50 MCG/ACT nasal spray INSTILL 1 TO 2 SPRAYS INTO EACH NOSTRIL ONCE A DAY  16 g  6   No facility-administered encounter medications on file as of 03/07/2013.

## 2013-03-14 ENCOUNTER — Other Ambulatory Visit: Payer: Self-pay

## 2013-03-21 ENCOUNTER — Other Ambulatory Visit: Payer: BC Managed Care – PPO

## 2013-03-21 DIAGNOSIS — Z79899 Other long term (current) drug therapy: Secondary | ICD-10-CM

## 2013-03-21 DIAGNOSIS — R748 Abnormal levels of other serum enzymes: Secondary | ICD-10-CM

## 2013-03-21 LAB — HEPATIC FUNCTION PANEL
ALT: 33 U/L (ref 0–35)
AST: 33 U/L (ref 0–37)
Albumin: 4.4 g/dL (ref 3.5–5.2)
Alkaline Phosphatase: 69 U/L (ref 39–117)
Indirect Bilirubin: 0.2 mg/dL (ref 0.0–0.9)
Total Protein: 7.4 g/dL (ref 6.0–8.3)

## 2013-04-03 ENCOUNTER — Other Ambulatory Visit: Payer: Self-pay | Admitting: Obstetrics and Gynecology

## 2013-04-03 DIAGNOSIS — Z1231 Encounter for screening mammogram for malignant neoplasm of breast: Secondary | ICD-10-CM

## 2013-04-12 ENCOUNTER — Encounter: Payer: Self-pay | Admitting: Pulmonary Disease

## 2013-05-08 ENCOUNTER — Ambulatory Visit (HOSPITAL_COMMUNITY)
Admission: RE | Admit: 2013-05-08 | Discharge: 2013-05-08 | Disposition: A | Discharge status: Home / Self Care | Payer: BC Managed Care – PPO | Source: Ambulatory Visit | Attending: Obstetrics and Gynecology | Admitting: Obstetrics and Gynecology

## 2013-05-08 DIAGNOSIS — Z1231 Encounter for screening mammogram for malignant neoplasm of breast: Secondary | ICD-10-CM | POA: Insufficient documentation

## 2013-07-01 ENCOUNTER — Other Ambulatory Visit: Payer: Self-pay | Admitting: Family Medicine

## 2013-07-24 ENCOUNTER — Ambulatory Visit: Payer: BC Managed Care – PPO | Admitting: Critical Care Medicine

## 2013-07-25 ENCOUNTER — Emergency Department (HOSPITAL_COMMUNITY)
Admission: EM | Admit: 2013-07-25 | Discharge: 2013-07-25 | Disposition: A | Discharge status: Home / Self Care | Payer: BC Managed Care – PPO | Source: Home / Self Care | Attending: Family Medicine | Admitting: Family Medicine

## 2013-07-25 ENCOUNTER — Emergency Department (HOSPITAL_COMMUNITY): Payer: BC Managed Care – PPO

## 2013-07-25 ENCOUNTER — Encounter (HOSPITAL_COMMUNITY): Payer: Self-pay | Admitting: Emergency Medicine

## 2013-07-25 DIAGNOSIS — S838X2A Sprain of other specified parts of left knee, initial encounter: Secondary | ICD-10-CM

## 2013-07-25 DIAGNOSIS — S99929A Unspecified injury of unspecified foot, initial encounter: Secondary | ICD-10-CM

## 2013-07-25 DIAGNOSIS — S99919A Unspecified injury of unspecified ankle, initial encounter: Secondary | ICD-10-CM

## 2013-07-25 DIAGNOSIS — S8990XA Unspecified injury of unspecified lower leg, initial encounter: Secondary | ICD-10-CM

## 2013-07-25 MED ORDER — HYDROCODONE-ACETAMINOPHEN 5-325 MG PO TABS: 1.0000 | ORAL_TABLET | Freq: Four times a day (QID) | ORAL | Status: DC | PRN

## 2013-07-25 NOTE — ED Notes (Signed)
Pt c/o left knee inj onset 45 minutes ago Reports she twisted knee while going down some steps Heard/felt something "pop" Hurts to bear wt.... Brought back in wheelchair Alert w/no signs of acute distress.

## 2013-07-25 NOTE — Discharge Instructions (Signed)
Thank you for coming in today. I am worried that he may have torn a meniscus. Continue diclofenac. Take Norco for severe pain as needed. Followup at [facility]. Use crutches and a hinged knee brace as needed

## 2013-07-25 NOTE — ED Provider Notes (Addendum)
Susan Gould is a 48 y.o. female who presents to Urgent Care today for left knee pain. Patient was walking down some stairs today. She pivoted on a planted foot with a flexed knee and felt a pop. She notes knee pain since then. She denies any significant swelling. She has mild pain with weightbearing. She did not fall to the ground. She denies any weakness or numbness bowel bladder dysfunction. She's not tried any medications yet. She has a history of chronic knee issues. She has had a tibial transposition of the knee as well as a lateral release both for patellofemoral chondromalacia.  Orthopedic surgeon is Dr. Percell Miller   Past Medical History  Diagnosis Date  . Asthma     (Dr. Joya Gaskins)  . Obesity   . Hypertension   . GERD (gastroesophageal reflux disease)   . Allergic rhinitis, cause unspecified     cat/dog/ragweed/mold/wool, s/p immunotherapy x 3-4 yrs  . Hyperlipidemia   . OSA (obstructive sleep apnea)     on CPAP (Dr. Gwenette Greet)  . Depression 2006  . Anxiety 2006  . Colon polyps 2007    Dr. Oletta Lamas (she was told repeat in 10 years)  . Hemorrhoid     internal and external  . Endometriosis     Dr. Leo Grosser  . Insulin resistance     Dr. Leo Grosser  . Family history of breast cancer in female    History  Substance Use Topics  . Smoking status: Never Smoker   . Smokeless tobacco: Never Used  . Alcohol Use: Yes     Comment: 6 drinks a week, beer   ROS as above Medications: No current facility-administered medications for this encounter.   Current Outpatient Prescriptions  Medication Sig Dispense Refill  . atorvastatin (LIPITOR) 40 MG tablet Take 1 tablet (40 mg total) by mouth daily.  30 tablet  5  . Calcium Carbonate-Vitamin D (CALCIUM + D) 600-200 MG-UNIT TABS Take 2 tablets by mouth daily.        . cetirizine (ZYRTEC) 10 MG tablet Take 10 mg by mouth daily.        . citalopram (CELEXA) 40 MG tablet take 1 tablet by mouth once daily  30 tablet  5  . diclofenac (VOLTAREN) 75  MG EC tablet Take 1 tablet (75 mg total) by mouth 2 (two) times daily.  60 tablet  5  . fish oil-omega-3 fatty acids 1000 MG capsule Take 3 g by mouth daily.       . fluocinonide cream (LIDEX) 0.05 % apply to affected area OF scalp if needed for rash  45 g  1  . fluticasone (FLONASE) 50 MCG/ACT nasal spray INSTILL 1 TO 2 SPRAYS INTO EACH NOSTRIL ONCE A DAY  16 g  11  . Fluticasone-Salmeterol (ADVAIR DISKUS) 250-50 MCG/DOSE AEPB inhale 1 dose by mouth twice a day as directed  60 each  11  . HYDROcodone-acetaminophen (NORCO/VICODIN) 5-325 MG per tablet Take 1 tablet by mouth every 6 (six) hours as needed.  15 tablet  0  . lisinopril-hydrochlorothiazide (PRINZIDE,ZESTORETIC) 20-25 MG per tablet Take 1 tablet by mouth daily.  30 tablet  5  . medroxyPROGESTERone (PROVERA) 10 MG tablet Take one tablet two times daily  60 tablet  11  . metFORMIN (GLUCOPHAGE-XR) 500 MG 24 hr tablet TAKE 2 TABLETS EVERY MORNING AND 1 TABLET EVERY EVENING  90 tablet  0  . Multiple Vitamins-Minerals (MULTIVITAMIN WITH MINERALS) tablet Take 1 tablet by mouth daily.        Marland Kitchen  omeprazole (PRILOSEC) 20 MG capsule Take 20 mg by mouth daily.        . vitamin B-12 (CYANOCOBALAMIN) 1000 MCG tablet Take 1,000 mcg by mouth daily.        Marland Kitchen zinc gluconate 50 MG tablet Take 50 mg by mouth daily.          Exam:  BP 114/54  Pulse 110  Temp(Src) 99.3 F (37.4 C) (Oral)  Resp 18  SpO2 100% Gen: Well NAD LEFT KNEE: Well appearing mature scar is. No effusions. Mildly tender medial joint line otherwise nontender. Normal motion.  Positive McMurray's test medially. Stable ligamentous exam. Capillary refill sensation and strength is intact  Assessment and Plan: 48 y.o. female with left knee pain. Most likely patient suffered a meniscus injury. Plan to use a hinged knee brace, continue diclofenac, Norco, and crutches as needed. Followup at [facility]  We'll not obtain x-rays at this setting because she will need  dedicated special views at the orthopedic office. I do not want to duplicate imaging. Very low probability of fracture.   Discussed warning signs or symptoms. Please see discharge instructions. Patient expresses understanding.    Gregor Hams, MD 07/25/13 2016  Gregor Hams, MD 07/25/13 2042

## 2013-07-26 ENCOUNTER — Ambulatory Visit: Payer: BC Managed Care – PPO | Admitting: Critical Care Medicine

## 2013-08-07 ENCOUNTER — Encounter: Payer: Self-pay | Admitting: Critical Care Medicine

## 2013-08-07 ENCOUNTER — Ambulatory Visit (INDEPENDENT_AMBULATORY_CARE_PROVIDER_SITE_OTHER): Payer: BC Managed Care – PPO | Admitting: Critical Care Medicine

## 2013-08-07 ENCOUNTER — Other Ambulatory Visit: Payer: Self-pay | Admitting: Family Medicine

## 2013-08-07 VITALS — BP 100/54 | HR 96 | Temp 98.7°F | Ht 64.0 in | Wt 263.8 lb

## 2013-08-07 DIAGNOSIS — J4540 Moderate persistent asthma, uncomplicated: Secondary | ICD-10-CM

## 2013-08-07 DIAGNOSIS — J45909 Unspecified asthma, uncomplicated: Secondary | ICD-10-CM

## 2013-08-07 NOTE — Progress Notes (Signed)
Subjective:    Patient ID: Susan Gould, female    DOB: 1966/04/17, 49 y.o.   MRN: [id number]  HPI  48 y.o. WF with moderate persistent asthma   03/07/2013 Chief Complaint  Patient presents with  . Follow-up    pt reports breathing is doing well-- denies any other concerns at this time  No real changes.  No wheezing. No rescue inhaler use. Pt denies any significant sore throat, nasal congestion or excess secretions, fever, chills, sweats, unintended weight loss, pleurtic or exertional chest pain, orthopnea PND, or leg swelling Pt denies any increase in rescue therapy over baseline, denies waking up needing it or having any early am or nocturnal exacerbations of coughing/wheezing/or dyspnea. Pt also denies any obvious fluctuation in symptoms with  weather or environmental change or other alleviating or aggravating factors  08/07/2013 Chief Complaint  Patient presents with  . Follow-up    Breathing doing well overall.  No SOB, wheezing, chest tightness/pain, or cough.  Hx of sinus infxn over new years.  Now is better. No wheezing or dyspnea   PUL ASTHMA HISTORY 08/07/2013 03/07/2013 10/14/2011  Symptoms 0-2 days/week 0-2 days/week 0-2 days/week  Nighttime awakenings 0-2/month 0-2/month 0-2/month  Interference with activity No limitations No limitations No limitations  SABA use 0-2 days/wk 0-2 days/wk 0-2 days/wk  Exacerbations requiring oral steroids 0-1 / year 0-1 / year 0-1 / year   Past Medical History  Diagnosis Date  . Asthma     (Dr. Joya Gaskins)  . Obesity   . Hypertension   . GERD (gastroesophageal reflux disease)   . Allergic rhinitis, cause unspecified     cat/dog/ragweed/mold/wool, s/p immunotherapy x 3-4 yrs  . Hyperlipidemia   . OSA (obstructive sleep apnea)     on CPAP (Dr. Gwenette Greet)  . Depression 2006  . Anxiety 2006  . Colon polyps 2007    Dr. Oletta Lamas (she was told repeat in 10 years)  . Hemorrhoid     internal and external  . Endometriosis     Dr. Leo Grosser  .  Insulin resistance     Dr. Leo Grosser  . Family history of breast cancer in female   . Medial meniscus tear     Left. Surgery [name]     Family History  Problem Relation Age of Onset  . Diabetes Mother   . Hypertension Mother   . Hyperlipidemia Mother   . Arthritis Mother   . Cancer Mother 29    breast cancer  . Breast cancer Mother 5  . Diabetes Father   . Hyperlipidemia Father   . Hypertension Father   . Asthma Father   . Arthritis Father   . Heart disease Father     CHF, hypokinesis  . COPD Father     due to asbestos exposure; former smoker  . Pulmonary embolism Father   . Hypertension Sister   . Breast cancer Paternal Aunt     65's  . Cancer Paternal Aunt     breast  . Heart disease Paternal Uncle   . Diabetes Paternal Uncle   . Kidney disease Paternal Uncle   . Colon cancer Maternal Grandmother     late 5's  . Cancer Maternal Grandmother     colon  . Heart disease Maternal Grandmother     MI in 49's     History   Social History  . Marital Status: Single    Spouse Name: N/A    Number of Children: 0  . Years of Education:  N/A   Occupational History  . 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 Smoking status: Never Smoker   . Smokeless tobacco: Never Used  . Alcohol Use: Yes     Comment: 6 drinks a week, beer  . Drug Use: No  . Sexual Activity: No   Other Topics Concern  . Not on file   Social History Narrative   Lives alone, 2 cats.  Sister and father live in [place]     No Known Allergies   Outpatient Prescriptions Prior to Visit  Medication Sig Dispense Refill  . atorvastatin (LIPITOR) 40 MG tablet Take 1 tablet (40 mg total) by mouth daily.  30 tablet  5  . Calcium Carbonate-Vitamin D (CALCIUM + D) 600-200 MG-UNIT TABS Take 2 tablets by mouth daily.        . cetirizine (ZYRTEC) 10 MG tablet Take 10 mg by mouth daily.        . diclofenac (VOLTAREN) 75 MG EC tablet Take 1 tablet (75 mg total) by mouth 2 (two) times  daily.  60 tablet  5  . fish oil-omega-3 fatty acids 1000 MG capsule Take 3 g by mouth daily.       . fluticasone (FLONASE) 50 MCG/ACT nasal spray INSTILL 1 TO 2 SPRAYS INTO EACH NOSTRIL ONCE A DAY  16 g  11  . Fluticasone-Salmeterol (ADVAIR DISKUS) 250-50 MCG/DOSE AEPB inhale 1 dose by mouth twice a day as directed  60 each  11  . HYDROcodone-acetaminophen (NORCO/VICODIN) 5-325 MG per tablet Take 1 tablet by mouth every 6 (six) hours as needed.  15 tablet  0  . medroxyPROGESTERone (PROVERA) 10 MG tablet Take one tablet two times daily  60 tablet  11  . metFORMIN (GLUCOPHAGE-XR) 500 MG 24 hr tablet TAKE 2 TABLETS EVERY MORNING AND 1 TABLET EVERY EVENING  90 tablet  0  . Multiple Vitamins-Minerals (MULTIVITAMIN WITH MINERALS) tablet Take 1 tablet by mouth daily.        Marland Kitchen omeprazole (PRILOSEC) 20 MG capsule Take 20 mg by mouth daily.        . vitamin B-12 (CYANOCOBALAMIN) 1000 MCG tablet Take 1,000 mcg by mouth daily.        Marland Kitchen zinc gluconate 50 MG tablet Take 50 mg by mouth daily.        . citalopram (CELEXA) 40 MG tablet take 1 tablet by mouth once daily  30 tablet  5  . lisinopril-hydrochlorothiazide (PRINZIDE,ZESTORETIC) 20-25 MG per tablet Take 1 tablet by mouth daily.  30 tablet  5  . fluocinonide cream (LIDEX) 0.05 % apply to affected area OF scalp if needed for rash  45 g  1   No facility-administered medications prior to visit.      Review of Systems  Constitutional:   No  weight loss, night sweats,  Fevers, chills, fatigue, lassitude. HEENT:   No headaches,  Difficulty swallowing,  Tooth/dental problems,  Sore throat,                No sneezing, itching, ear ache, nasal congestion, post nasal drip,   CV:  No chest pain,  Orthopnea, PND, swelling in lower extremities, anasarca, dizziness, palpitations  GI  No heartburn, indigestion, abdominal pain, nausea, vomiting, diarrhea, change in bowel habits, loss of appetite  Resp: No shortness of breath with exertion or at rest.  No  excess mucus, no productive cough,  No non-productive cough,  No coughing up of blood.  No change in color  of mucus.  No wheezing.  No chest wall deformity  Skin: no rash or lesions.  GU: no dysuria, change in color of urine, no urgency or frequency.  No flank pain.  MS:  No joint pain or swelling.  No decreased range of motion.  No back pain.  Psych:  No change in mood or affect. No depression or anxiety.  No memory loss.     Objective:   Physical Exam   Filed Vitals:   08/07/13 0939  BP: 100/54  Pulse: 96  Temp: 98.7 F (37.1 C)  TempSrc: Oral  Height: 5' 4"  (1.626 m)  Weight: 263 lb 12.8 oz (119.659 kg)  SpO2: 97%    Gen: Pleasant, well-nourished, in no distress,  normal affect  ENT: No lesions,  mouth clear,  oropharynx clear, no postnasal drip  Neck: No JVD, no TMG, no carotid bruits  Lungs: No use of accessory muscles, no dullness to percussion, clear without rales or rhonchi  Cardiovascular: RRR, heart sounds normal, no murmur or gallops, no peripheral edema  Abdomen: soft and NT, no HSM,  BS normal  Musculoskeletal: No deformities, no cyanosis or clubbing  Neuro: alert, non focal  Skin: Warm, no lesions or rashes   08/07/2013  Arlyce Harman: normal. Improved from 2011       Assessment & Plan:   Moderate persistent asthma in adult without complication Moderate persistent asthma stable at present Note NORMAL spirometry 08/07/2013 Plan  No change in inhaled or maintenance medications.    Updated Medication List Outpatient Encounter Prescriptions as of 08/07/2013  Medication Sig  . atorvastatin (LIPITOR) 40 MG tablet Take 1 tablet (40 mg total) by mouth daily.  . Calcium Carbonate-Vitamin D (CALCIUM + D) 600-200 MG-UNIT TABS Take 2 tablets by mouth daily.    . cetirizine (ZYRTEC) 10 MG tablet Take 10 mg by mouth daily.    . cyclobenzaprine (FLEXERIL) 5 MG tablet as needed.  . diclofenac (VOLTAREN) 75 MG EC tablet Take 1 tablet (75 mg total) by mouth 2 (two)  times daily.  . fish oil-omega-3 fatty acids 1000 MG capsule Take 3 g by mouth daily.   . fluticasone (FLONASE) 50 MCG/ACT nasal spray INSTILL 1 TO 2 SPRAYS INTO EACH NOSTRIL ONCE A DAY  . Fluticasone-Salmeterol (ADVAIR DISKUS) 250-50 MCG/DOSE AEPB inhale 1 dose by mouth twice a day as directed  . HYDROcodone-acetaminophen (NORCO/VICODIN) 5-325 MG per tablet Take 1 tablet by mouth every 6 (six) hours as needed.  . medroxyPROGESTERone (PROVERA) 10 MG tablet Take one tablet two times daily  . metFORMIN (GLUCOPHAGE-XR) 500 MG 24 hr tablet TAKE 2 TABLETS EVERY MORNING AND 1 TABLET EVERY EVENING  . Multiple Vitamins-Minerals (MULTIVITAMIN WITH MINERALS) tablet Take 1 tablet by mouth daily.    Marland Kitchen omeprazole (PRILOSEC) 20 MG capsule Take 20 mg by mouth daily.    . vitamin B-12 (CYANOCOBALAMIN) 1000 MCG tablet Take 1,000 mcg by mouth daily.    Marland Kitchen zinc gluconate 50 MG tablet Take 50 mg by mouth daily.    . [DISCONTINUED] citalopram (CELEXA) 40 MG tablet take 1 tablet by mouth once daily  . [DISCONTINUED] lisinopril-hydrochlorothiazide (PRINZIDE,ZESTORETIC) 20-25 MG per tablet Take 1 tablet by mouth daily.  . [DISCONTINUED] fluocinonide cream (LIDEX) 0.05 % apply to affected area OF scalp if needed for rash

## 2013-08-07 NOTE — Assessment & Plan Note (Signed)
Moderate persistent asthma stable at present Note NORMAL spirometry 08/07/2013 Plan  No change in inhaled or maintenance medications.

## 2013-08-07 NOTE — Patient Instructions (Signed)
No change in medications. Return in        6 months        

## 2013-08-13 ENCOUNTER — Other Ambulatory Visit: Payer: Self-pay | Admitting: Family Medicine

## 2013-08-16 ENCOUNTER — Encounter: Payer: Self-pay | Admitting: Family Medicine

## 2013-08-16 ENCOUNTER — Encounter: Payer: BC Managed Care – PPO | Admitting: Family Medicine

## 2013-08-16 ENCOUNTER — Ambulatory Visit (INDEPENDENT_AMBULATORY_CARE_PROVIDER_SITE_OTHER): Payer: BC Managed Care – PPO | Admitting: Family Medicine

## 2013-08-16 VITALS — BP 128/60 | HR 76 | Ht 64.25 in | Wt 257.0 lb

## 2013-08-16 DIAGNOSIS — IMO0002 Reserved for concepts with insufficient information to code with codable children: Secondary | ICD-10-CM

## 2013-08-16 DIAGNOSIS — Z6841 Body Mass Index (BMI) 40.0 and over, adult: Secondary | ICD-10-CM

## 2013-08-16 DIAGNOSIS — I10 Essential (primary) hypertension: Secondary | ICD-10-CM

## 2013-08-16 DIAGNOSIS — Z79899 Other long term (current) drug therapy: Secondary | ICD-10-CM

## 2013-08-16 DIAGNOSIS — M1710 Unilateral primary osteoarthritis, unspecified knee: Secondary | ICD-10-CM

## 2013-08-16 DIAGNOSIS — F329 Major depressive disorder, single episode, unspecified: Secondary | ICD-10-CM

## 2013-08-16 DIAGNOSIS — E348 Other specified endocrine disorders: Secondary | ICD-10-CM

## 2013-08-16 DIAGNOSIS — F411 Generalized anxiety disorder: Secondary | ICD-10-CM

## 2013-08-16 DIAGNOSIS — K219 Gastro-esophageal reflux disease without esophagitis: Secondary | ICD-10-CM

## 2013-08-16 DIAGNOSIS — E782 Mixed hyperlipidemia: Secondary | ICD-10-CM

## 2013-08-16 DIAGNOSIS — F3289 Other specified depressive episodes: Secondary | ICD-10-CM

## 2013-08-16 LAB — POCT GLYCOSYLATED HEMOGLOBIN (HGB A1C): Hemoglobin A1C: 5.2

## 2013-08-16 MED ORDER — DICLOFENAC SODIUM 75 MG PO TBEC: 75.0000 mg | DELAYED_RELEASE_TABLET | Freq: Two times a day (BID) | ORAL | Status: DC

## 2013-08-16 MED ORDER — ALPRAZOLAM 0.5 MG PO TABS: 0.2500 mg | ORAL_TABLET | Freq: Three times a day (TID) | ORAL | Status: DC | PRN

## 2013-08-16 MED ORDER — ATORVASTATIN CALCIUM 40 MG PO TABS: 40.0000 mg | ORAL_TABLET | Freq: Every day | ORAL | Status: DC

## 2013-08-16 MED ORDER — BUPROPION HCL ER (XL) 150 MG PO TB24: 150.0000 mg | ORAL_TABLET | Freq: Every day | ORAL | Status: DC

## 2013-08-16 NOTE — Patient Instructions (Addendum)
  Ear itching--likely some eczema component.  Doesn't look like otitis externa at the present time.  Resume use of oil and OTC topical hydrocortisone cream as needed.  If develops increasing pain, foul-smelling discharge, call for drops.  May need referral to ENT for further evaluation-  Start wellbutrin XL one tablet every morning.  Continue the citalopram.  Use the alprazolam only if needed for severe anxiety (use just 1/2 tablet if during the day--will cause some sedation).

## 2013-08-16 NOTE — Progress Notes (Signed)
Chief Complaint  Patient presents with  . Diabetes    nonfasting med check.    She is drinking at least 64 ounces per day, trying to be better hydrated, but continues to have muscle cramps in feet, toes, calves, sometimes hands. It is worse after workouts, but still has some on other days.  Hyperlipidemia follow-up:  Patient is reportedly following a low-fat, low cholesterol diet.  Compliant with medications and denies medication side effects.   Last lipids were: Lab Results  Component Value Date   CHOL 162 02/13/2013   HDL 42 02/13/2013   LDLCALC 77 02/13/2013   TRIG 214* 02/13/2013   CHOLHDL 3.9 02/13/2013   LFT's were mildly elevated in 01/2013 (AST/ALT 70/123), but repeat 02/2013 was normal. Lab Results  Component Value Date   ALT 33 03/21/2013   AST 33 03/21/2013   ALKPHOS 69 03/21/2013   BILITOT 0.3 03/21/2013   Hypertension follow-up: Blood pressures aren't checked elsewhere, other than at doctors visit, and have all been <120's/60's. Denies dizziness, headaches, chest pain, edema. Denies side effects of medications. She has a dry cough, which started prior to BP medsl and seems to be related to reflux, as cough is worse if she doesn't take prilosec. Occasional mild dizziness while working out.  PCOS/insulin resistance:  Compliant with metformin.  Last A1c was 6.2 in 01/2013. She doesn't check her sugars.  Denies polydipsia, polyuria. Last eye appt was 1 year ago, no problems.  GERD--takes Prilosec daily. Can skip it for a few days if she avoids certain foods, including dairy.  Cough recurs when she stops the prilosec. She takes it most days.  Depression/anxiety (mostly anxiety, depression related to mom's death)--Overall doing well on citalopram. Dad has some health issues, but siblings are helping out some (pt is primary--manages his finances, takes him to doctors).  Has been on antidepressants for many years (5-10 years), has never come off, and with her current stressors, feels she  would like to continue it.  Uses xanax only with "crises"--has been out, but feels like she could use it once a week if she had it available.  She admits to being an emotional eater, which contributes to her inability to lose weight, when exercising a lot.  OA--L knee. Needs to take diclofenac BID or else feels overall very achey, and increased knee pain. Also gets back pain. Denies abdominal pain.  She had MRI last month in L knee after a twisting injury--no tears seen, just arthritis.  Still has ongoing knee pain.  Frustrated that it showed "only arthritis".  She saw Dr. Lynann Bologna.  This frustration leads to emotional eating. She had to cut back on some of her exercise. She has some concerns about cortisone shots.  Asthma and allergies--well controlled on her current regimen.  She saw Dr. Joya Gaskins last week.  She has had ear infections recently (with 2 courses of topical and oral meds), last of which was on New Year's day.  She is currently complaining of bilateral ear drainage and itching, with intermittent mild discomfort.  Past Medical History  Diagnosis Date  . Asthma     (Dr. Joya Gaskins)  . Obesity   . Hypertension   . GERD (gastroesophageal reflux disease)   . Allergic rhinitis, cause unspecified     cat/dog/ragweed/mold/wool, s/p immunotherapy x 3-4 yrs  . Hyperlipidemia   . OSA (obstructive sleep apnea)     on CPAP (Dr. Gwenette Greet)  . Depression 2006  . Anxiety 2006  . Colon polyps 2007  Dr. Oletta Lamas (she was told repeat in 10 years)  . Hemorrhoid     internal and external  . Endometriosis     Dr. Leo Grosser  . Insulin resistance     Dr. Leo Grosser  . Family history of breast cancer in female   . Medial meniscus tear     Left. Surgery [name].  NOT seen on MRI 06/2013   Past Surgical History  Procedure Laterality Date  . Cholecystectomy  1986  . Knee surgery  '95 and '97    BILATERAL KNEE  . Finger ganglion cyst excision  2003    RIGHT 3rd finger  . Lasik  2000  . Laparoscopy   '98, '01    endometriosis (Dr. Leo Grosser)  . Tibial tuberoplasty  90's    left   History   Social History  . Marital Status: Single    Spouse Name: N/A    Number of Children: 0  . Years of Education: N/A   Occupational History  . 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 Smoking status: Never Smoker   . Smokeless tobacco: Never Used  . Alcohol Use: Yes     Comment: 2-3 drinks a week, beer  . Drug Use: No  . Sexual Activity: No   Other Topics Concern  . Not on file   Social History Narrative   Lives alone, 2 cats.  Sister and father live in [place]    Outpatient Encounter Prescriptions as of 08/16/2013  Medication Sig  . atorvastatin (LIPITOR) 40 MG tablet Take 1 tablet (40 mg total) by mouth daily.  . cetirizine (ZYRTEC) 10 MG tablet Take 10 mg by mouth daily.    . citalopram (CELEXA) 40 MG tablet take 1 tablet by mouth once daily  . cyclobenzaprine (FLEXERIL) 5 MG tablet as needed.  . diclofenac (VOLTAREN) 75 MG EC tablet Take 1 tablet (75 mg total) by mouth 2 (two) times daily.  . fish oil-omega-3 fatty acids 1000 MG capsule Take 3 g by mouth daily.   . fluticasone (FLONASE) 50 MCG/ACT nasal spray INSTILL 1 TO 2 SPRAYS INTO EACH NOSTRIL ONCE A DAY  . Fluticasone-Salmeterol (ADVAIR DISKUS) 250-50 MCG/DOSE AEPB inhale 1 dose by mouth twice a day as directed  . HYDROcodone-acetaminophen (NORCO/VICODIN) 5-325 MG per tablet Take 1 tablet by mouth every 6 (six) hours as needed.  Marland Kitchen lisinopril-hydrochlorothiazide (PRINZIDE,ZESTORETIC) 20-25 MG per tablet take 1 tablet by mouth once daily  . medroxyPROGESTERone (PROVERA) 10 MG tablet Take one tablet two times daily  . metFORMIN (GLUCOPHAGE-XR) 500 MG 24 hr tablet take 2 tablets by mouth every morning and 1 tablet every evening  . Multiple Vitamins-Minerals (MULTIVITAMIN WITH MINERALS) tablet Take 1 tablet by mouth daily.    Marland Kitchen omeprazole (PRILOSEC) 20 MG capsule Take 20 mg by mouth daily.    . vitamin B-12  (CYANOCOBALAMIN) 1000 MCG tablet Take 1,000 mcg by mouth daily.    Marland Kitchen zinc gluconate 50 MG tablet Take 50 mg by mouth daily.    . Calcium Carbonate-Vitamin D (CALCIUM + D) 600-200 MG-UNIT TABS Take 2 tablets by mouth daily.     No Known Allergies  ROS:  Denies fevers, sinus problems, shortness of breath.  +itchy ears with drainage. Weight unchanged from 6 mos ago (had gained some, and lost it).  Denies headaches, dizziness, chest pain, palpitations, GI or GU complaints.  +knee pain.  See HPI.  No bleeding, bruising, rashes.  +depression/anxiety.  PHYSICAL EXAM: BP 128/60  Pulse  76  Ht 5' 4.25" (1.632 m)  Wt 257 lb (116.574 kg)  BMI 43.77 kg/m2 Well developed, pleasant, obese female in no distress. She became tearful in discussing her moods, frustration with her knee pain HEENT:  PERRL, EOMI, conjunctiva clear. TM obscured by cerumen on right, normal on left.  EAC's are minimally swollen, no erythema, no exudate. Some flaky skin and slight erythema on most distal/external portion of EAC. OP clear Neck: no lymphadenopathy, thyromegaly or mass Heart: regular rate and rhythm Lungs: clear bilaterally Back: no CVA or spinal tenderness Abdomen: soft, nontender, no organomegaly or mass Extremities: 2+ pulse, no edema Psych: +tearful, full range of affect. Normal speech, eye contact, hygiene and grooming Neuro: alert and oriented.  Cranial nerves intact. Normal gait, strength  ASSESSMENT/PLAN:  HYPERTENSION - controlled - Plan: Comprehensive metabolic panel  Morbid obesity with body mass index of 45.0-49.9 in adult - counseled re: diet, portions, need to eliminate emotional eating.   - Plan: TSH  GERD - controlled  Mixed hyperlipidemia - return for fasting labs - Plan: atorvastatin (LIPITOR) 40 MG tablet, Lipid panel  INSULIN RESISTANCE SYNDROME - stable/controlled - Plan: HgB A1c, TSH  ANXIETY - borderline control - Plan: ALPRAZolam (XANAX) 0.5 MG tablet  Arthritis of knee - counseled  re: risks/benefits of cortisone shot.  encouraged further f/u and treatment - Plan: diclofenac (VOLTAREN) 75 MG EC tablet  Depressive disorder, not elsewhere classified - add wellbutrin XL. risks/side effects reviewed.  encouraged counseling - Plan: buPROPion (WELLBUTRIN XL) 150 MG 24 hr tablet, TSH  Encounter for long-term (current) use of other medications - Plan: CBC with Differential, Comprehensive metabolic panel, Lipid panel  Ear itching--likely some eczema component.  Doesn't look like otitis externa at the present time.  Resume use of oil and OTC topical hydrocortisone cream as needed.  If develops increasing pain, foul-smelling discharge, call for drops.  May need referral to ENT for further evaluation--declines today.  Anxiety/depression--some increased anxiety (related to her dad's health, she is a Research officer, trade union, frustration/resentment, especially re: knee pain) Add wellbutrin XL 150 once daily.  She has no contraindications.  Risks/side effects reviewed.  This might help decrease some of her emotional eating (and cravings?) and help with weight loss.  Continue exercise, following limitations per ortho. Continue citalopram 40 mg daily. Encouraged counseling  Just got 30 d supply of metformin, lisinopril HCT and citalopram--would prefer to wait and have pharmacy call when refill needed rather than fill today  Schedule fasting labs--c-met, lipid, A1c, TSH, CBC  F/u 6 weeks (f/u moods) F/u 6 months for med check  55 minute visit, with more than 1/2 spent counseling

## 2013-08-22 ENCOUNTER — Other Ambulatory Visit: Payer: BC Managed Care – PPO

## 2013-08-23 ENCOUNTER — Other Ambulatory Visit: Payer: BC Managed Care – PPO

## 2013-08-23 DIAGNOSIS — I10 Essential (primary) hypertension: Secondary | ICD-10-CM

## 2013-08-23 DIAGNOSIS — F3289 Other specified depressive episodes: Secondary | ICD-10-CM

## 2013-08-23 DIAGNOSIS — F329 Major depressive disorder, single episode, unspecified: Secondary | ICD-10-CM

## 2013-08-23 DIAGNOSIS — Z6841 Body Mass Index (BMI) 40.0 and over, adult: Secondary | ICD-10-CM

## 2013-08-23 DIAGNOSIS — E782 Mixed hyperlipidemia: Secondary | ICD-10-CM

## 2013-08-23 DIAGNOSIS — Z79899 Other long term (current) drug therapy: Secondary | ICD-10-CM

## 2013-08-23 DIAGNOSIS — E348 Other specified endocrine disorders: Secondary | ICD-10-CM

## 2013-08-23 LAB — CBC WITH DIFFERENTIAL/PLATELET
BASOS PCT: 1 % (ref 0–1)
Basophils Absolute: 0.1 10*3/uL (ref 0.0–0.1)
EOS ABS: 0.1 10*3/uL (ref 0.0–0.7)
Eosinophils Relative: 2 % (ref 0–5)
HCT: 32 % — ABNORMAL LOW (ref 36.0–46.0)
HEMOGLOBIN: 10.9 g/dL — AB (ref 12.0–15.0)
Lymphocytes Relative: 48 % — ABNORMAL HIGH (ref 12–46)
Lymphs Abs: 3.5 10*3/uL (ref 0.7–4.0)
MCH: 31.1 pg (ref 26.0–34.0)
MCHC: 34.1 g/dL (ref 30.0–36.0)
MCV: 91.2 fL (ref 78.0–100.0)
MONOS PCT: 10 % (ref 3–12)
Monocytes Absolute: 0.7 10*3/uL (ref 0.1–1.0)
NEUTROS ABS: 2.8 10*3/uL (ref 1.7–7.7)
NEUTROS PCT: 39 % — AB (ref 43–77)
Platelets: 257 10*3/uL (ref 150–400)
RBC: 3.51 MIL/uL — ABNORMAL LOW (ref 3.87–5.11)
RDW: 14.6 % (ref 11.5–15.5)
WBC: 7.3 10*3/uL (ref 4.0–10.5)

## 2013-08-23 LAB — TSH: TSH: 1.308 u[IU]/mL (ref 0.350–4.500)

## 2013-08-23 LAB — COMPREHENSIVE METABOLIC PANEL
ALK PHOS: 55 U/L (ref 39–117)
ALT: 45 U/L — AB (ref 0–35)
AST: 40 U/L — AB (ref 0–37)
Albumin: 4.4 g/dL (ref 3.5–5.2)
BILIRUBIN TOTAL: 0.5 mg/dL (ref 0.2–1.2)
BUN: 22 mg/dL (ref 6–23)
CO2: 22 mEq/L (ref 19–32)
Calcium: 9.6 mg/dL (ref 8.4–10.5)
Chloride: 102 mEq/L (ref 96–112)
Creat: 1.04 mg/dL (ref 0.50–1.10)
Glucose, Bld: 85 mg/dL (ref 70–99)
Potassium: 4.5 mEq/L (ref 3.5–5.3)
SODIUM: 135 meq/L (ref 135–145)
TOTAL PROTEIN: 7.5 g/dL (ref 6.0–8.3)

## 2013-08-23 LAB — LIPID PANEL
Cholesterol: 136 mg/dL (ref 0–200)
HDL: 32 mg/dL — ABNORMAL LOW (ref >39)
LDL CALC: 66 mg/dL (ref 0–99)
Total CHOL/HDL Ratio: 4.3 Ratio
Triglycerides: 188 mg/dL — ABNORMAL HIGH (ref <150)
VLDL: 38 mg/dL (ref 0–40)

## 2013-09-01 ENCOUNTER — Ambulatory Visit (INDEPENDENT_AMBULATORY_CARE_PROVIDER_SITE_OTHER): Payer: BC Managed Care – PPO | Admitting: Pulmonary Disease

## 2013-09-01 ENCOUNTER — Encounter: Payer: Self-pay | Admitting: Pulmonary Disease

## 2013-09-01 VITALS — BP 120/68 | HR 81 | Temp 97.5°F | Ht 64.0 in | Wt 259.8 lb

## 2013-09-01 DIAGNOSIS — G4733 Obstructive sleep apnea (adult) (pediatric): Secondary | ICD-10-CM

## 2013-09-01 NOTE — Patient Instructions (Signed)
Will put in order for new cpap machine, and will set on auto setting.  Let us know if you would prefer to be on fixed pressure. Work on weight loss followup with me in one year.

## 2013-09-01 NOTE — Progress Notes (Signed)
Subjective:    Patient ID: Susan Gould, female    DOB: 24-Nov-1965, 48 y.o.   MRN: [id number]  HPI The patient is a 48 year old female who I've been asked to see for management of obstructive sleep apnea. She was diagnosed in 2007 with severe OSA, with an AHI of 42 events per hour. She was started on CPAP, and I have not seen her since that time. The patient feels that she is doing well on CPAP, and is using nasal pillows with a heated humidifier. She has been keeping up with her mask changes and other supplies. She feels that she is sleeping well, and is rested in the mornings upon arising. Overall, she denies significant daytime sleepiness, but does have some sleep pressure in the afternoons. The patient states that her weight is down 20 pounds over the last 2 years, and her Epworth score today is 8. However, upon review of the record, her weight is up 30 pounds from the last time I saw her in 2007.   Sleep Questionnaire What time do you typically go to bed?( Between what hours) 03-1029 03-1029 at 1610 on 09/01/13 by Virl Cagey, CMA How long does it take you to fall asleep? 57mn 118m at 1610 on 09/01/13 by AsVirl CageyCMA How many times during the night do you wake up? 3 3 at 1610 on 09/01/13 by AsVirl CageyCMA What time do you get out of bed to start your day? 0630 0630 at 1610 on 09/01/13 by AsVirl CageyCMA Do you drive or operate heavy machinery in your occupation? No No at 1610 on 09/01/13 by AsVirl CageyCMA How much has your weight changed (up or down) over the past two years? (In pounds) 20 lb (9.072 kg) 20 lb (9.072 kg) at 1610 on 09/01/13 by AsVirl CageyCMA Have you ever had a sleep study before? Yes Yes at 1610 on 09/01/13 by AsVirl CageyCMA If yes, location of study? [facility] at 1610 on 09/01/13 by AsVirl CageyCMA If yes, date of study? 2007 2007 at 1610 on 09/01/13 by AsVirl CageyCMA Do you currently use CPAP? Yes Yes at  1610 on 09/01/13 by AsVirl CageyCMA If so, what pressure? 10-12 10-12 at 1610 on 09/01/13 by AsVirl CageyCMA Do you wear oxygen at any time? No No at 1610 on 09/01/13 by AsVirl CageyCMA   Review of Systems  Constitutional: Negative for fever and unexpected weight change.  HENT: Negative for congestion, dental problem, ear pain, nosebleeds, postnasal drip, rhinorrhea, sinus pressure, sneezing, sore throat and trouble swallowing.   Eyes: Negative for redness and itching.  Respiratory: Positive for cough. Negative for chest tightness, shortness of breath and wheezing.   Cardiovascular: Negative for palpitations and leg swelling.  Gastrointestinal: Negative for nausea and vomiting.       Reflux  Genitourinary: Negative for dysuria.  Musculoskeletal: Negative for joint swelling.  Skin: Negative for rash.  Neurological: Negative for headaches.  Hematological: Does not bruise/bleed easily.  Psychiatric/Behavioral: Positive for dysphoric mood. The patient is nervous/anxious.        Objective:   Physical Exam Constitutional:  Obese female, no acute distress  HENT:  Nares patent without discharge  Oropharynx without exudate, palate and uvula are elongated, mild tonsillar hypertrophy  Eyes:  Perrla, eomi, no scleral icterus  Neck:  No JVD, no TMG  Cardiovascular:  Normal rate, regular rhythm, no rubs  or gallops.  No murmurs        Intact distal pulses  Pulmonary :  Normal breath sounds, no stridor or respiratory distress   No rales, rhonchi, or wheezing  Abdominal:  Soft, nondistended, bowel sounds present.  No tenderness noted.   Musculoskeletal:  No lower extremity edema noted.  Lymph Nodes:  No cervical lymphadenopathy noted  Skin:  No cyanosis noted  Neurologic:  Alert, appropriate, moves all 4 extremities without obvious deficit.         Assessment & Plan:

## 2013-09-01 NOTE — Assessment & Plan Note (Signed)
The patient has a history of severe obstructive sleep apnea, and has not been seen in many years. The good news here is that she has been staying compliant with CPAP, and feels that she has done extremely well with the device. She is due for a new CPAP machine, and we'll send an order to her home care company for this. She has gained 30 pounds since her last visit, and therefore will take this opportunity to optimize her pressure again. We'll set her device on the auto setting, and will leave there unless she prefers to be on a fixed pressure.  I have also stressed to her the importance of aggressive weight loss.

## 2013-09-04 ENCOUNTER — Other Ambulatory Visit: Payer: Self-pay | Admitting: Family Medicine

## 2013-09-10 ENCOUNTER — Other Ambulatory Visit: Payer: Self-pay | Admitting: Family Medicine

## 2013-09-13 ENCOUNTER — Ambulatory Visit (INDEPENDENT_AMBULATORY_CARE_PROVIDER_SITE_OTHER): Payer: BC Managed Care – PPO | Admitting: Family Medicine

## 2013-09-13 ENCOUNTER — Encounter: Payer: Self-pay | Admitting: Family Medicine

## 2013-09-13 VITALS — BP 110/70 | HR 80 | Ht 64.25 in | Wt 252.0 lb

## 2013-09-13 DIAGNOSIS — F411 Generalized anxiety disorder: Secondary | ICD-10-CM

## 2013-09-13 NOTE — Progress Notes (Signed)
Chief Complaint  Patient presents with  . Follow-up    4 week follow up on moods. (was supposed to be 6 weeks but it has only been 4)   Patient presents for f/u on moods, after adding Wellbutrin to her 90m of citalopram.  She has noticed some decrease in appetite, as well as a mildly altered taste (tolerable).  Moods have been a little more even.  Less emotional eating than prior.  She reports that the first week there was an issue with her dad, got her worked up.  It occurred just after lunch, so she was driven to eat.  She liked having the xanax around--didn't take it because it was during day, but has it available if needed, and it helped her just knowing that.  She had a cortisone shot in left knee 6 days ago by Dr. VLynann Bologna The fullness behind her knee has improved.  No longer limping, but still having anterior knee pain with a lot of activity. She cut back on diclofenac to once daily, and using tylenol arthritis as needed.  No increase in pain since cutting back the dose. She needs to use the norco in the evenings, over the last week.  Plan is to consider hyaluronic acid injections if not improving. She has f/u with Dr. VLynann Bolognanext week.   Past Medical History  Diagnosis Date  . Asthma     (Dr. WJoya Gaskins  . Obesity   . Hypertension   . GERD (gastroesophageal reflux disease)   . Allergic rhinitis, cause unspecified     cat/dog/ragweed/mold/wool, s/p immunotherapy x 3-4 yrs  . Hyperlipidemia   . OSA (obstructive sleep apnea)     on CPAP (Dr. CGwenette Greet  . Depression 2006  . Anxiety 2006  . Colon polyps 2007    Dr. EOletta Lamas(she was told repeat in 10 years)  . Hemorrhoid     internal and external  . Endometriosis     Dr. HLeo Grosser . Insulin resistance     Dr. HLeo Grosser . Family history of breast cancer in female   . Medial meniscus tear     Left. Surgery DAmada Jupiter  NOT seen on MRI 06/2013   Past Surgical History  Procedure Laterality Date  . Cholecystectomy  1986  . Knee surgery   '95 and '97    BILATERAL KNEE  . Finger ganglion cyst excision  2003    RIGHT 3rd finger  . Lasik  2000  . Laparoscopy  '98, '01    endometriosis (Dr. HLeo Grosser  . Tibial tuberoplasty  90's    left   History   Social History  . Marital Status: Single    Spouse Name: N/A    Number of Children: 0  . Years of Education: N/A   Occupational History  . 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 Smoking status: Never Smoker   . Smokeless tobacco: Never Used  . Alcohol Use: Yes     Comment: 2-3 drinks a week, beer  . Drug Use: No  . Sexual Activity: No   Other Topics Concern  . Not on file   Social History Narrative   Lives alone, 2 cats.  [place]  Outpatient Encounter Prescriptions as of 09/13/2013  Medication Sig Note  . acetaminophen (TYLENOL) 650 MG CR tablet Take 1,300 mg by mouth every morning.   .Marland KitchenALPRAZolam (XANAX) 0.5 MG tablet Take 0.5-1 tablets (0.25-0.5 mg total) by mouth 3 (  three) times daily as needed for anxiety.   Marland Kitchen atorvastatin (LIPITOR) 40 MG tablet Take 1 tablet (40 mg total) by mouth daily.   Marland Kitchen buPROPion (WELLBUTRIN XL) 150 MG 24 hr tablet Take 1 tablet (150 mg total) by mouth daily.   . Calcium Carbonate-Vitamin D (CALCIUM + D) 600-200 MG-UNIT TABS Take 2 tablets by mouth daily.     . cetirizine (ZYRTEC) 10 MG tablet Take 10 mg by mouth daily.     . citalopram (CELEXA) 40 MG tablet TAKE 1 TABLET BY MOUTH ONCE DAILY   . cyclobenzaprine (FLEXERIL) 5 MG tablet as needed. 08/07/2013: Received from: External Pharmacy Received Sig:   . diclofenac (VOLTAREN) 75 MG EC tablet Take 1 tablet (75 mg total) by mouth 2 (two) times daily. 09/13/2013: Taking once daily  . fish oil-omega-3 fatty acids 1000 MG capsule Take 3 g by mouth daily.    . fluticasone (FLONASE) 50 MCG/ACT nasal spray INSTILL 1 TO 2 SPRAYS INTO EACH NOSTRIL ONCE A DAY   . Fluticasone-Salmeterol (ADVAIR DISKUS) 250-50 MCG/DOSE AEPB inhale 1 dose by mouth twice a  day as directed   . HYDROcodone-acetaminophen (NORCO/VICODIN) 5-325 MG per tablet Take 1 tablet by mouth every 6 (six) hours as needed.   Marland Kitchen lisinopril-hydrochlorothiazide (PRINZIDE,ZESTORETIC) 20-25 MG per tablet TAKE 1 TABLET BY MOUTH ONCE DAILY   . medroxyPROGESTERone (PROVERA) 10 MG tablet Take one tablet two times daily 08/03/2012: Takes 2 tablets every morning  . metFORMIN (GLUCOPHAGE-XR) 500 MG 24 hr tablet TAKE 2 TABLETS BY MOUTH EVERY MORNING AND 1 TABLET EVERY EVENING   . Multiple Vitamins-Minerals (MULTIVITAMIN WITH MINERALS) tablet Take 1 tablet by mouth daily.     Marland Kitchen omeprazole (PRILOSEC) 20 MG capsule Take 20 mg by mouth daily.     . vitamin B-12 (CYANOCOBALAMIN) 1000 MCG tablet Take 1,000 mcg by mouth daily.     . vitamin C (ASCORBIC ACID) 500 MG tablet Take 500 mg by mouth daily.   Marland Kitchen zinc gluconate 50 MG tablet Take 50 mg by mouth daily.     . [DISCONTINUED] lisinopril-hydrochlorothiazide (PRINZIDE,ZESTORETIC) 20-25 MG per tablet take 1 tablet by mouth once daily   . [DISCONTINUED] metFORMIN (GLUCOPHAGE-XR) 500 MG 24 hr tablet take 2 tablets by mouth every morning and 1 tablet every evening    No Known Allergies  ROS:  Denies fevers, chills, nausea, vomiting, URI symptoms, shortness of breath, bleeding, bruising, or other complaints, except as noted in HPI.   PHYSICAL EXAM: BP 110/70  Pulse 80  Ht 5' 4.25" (1.632 m)  Wt 252 lb (114.306 kg)  BMI 42.92 kg/m2 Well developed, pleasant female, in no distress Normal mood, affect, hygiene, grooming, eye contact and speech  ASSESSMENT/PLAN:  ANXIETY  Improved since adding wellbutrin to citalopram.  Tolerating without side effects.  She has lost some weight since starting it.  She just refilled it, no additional refills left.  Pharmacy to call when refills needed--won't be for at least 3 weeks, okay to refill x 6 months when they call.  F/u as scheduled

## 2013-09-13 NOTE — Patient Instructions (Signed)
Continue your current medications, and follow up with Dr. Lynann Bologna as recommended

## 2013-10-03 ENCOUNTER — Other Ambulatory Visit: Payer: Self-pay | Admitting: Family Medicine

## 2013-10-10 ENCOUNTER — Other Ambulatory Visit: Payer: Self-pay | Admitting: Family Medicine

## 2013-10-17 ENCOUNTER — Encounter: Payer: Self-pay | Admitting: Family Medicine

## 2013-10-17 DIAGNOSIS — F411 Generalized anxiety disorder: Secondary | ICD-10-CM

## 2013-10-17 MED ORDER — BUPROPION HCL ER (XL) 300 MG PO TB24: ORAL_TABLET | ORAL | Status: DC

## 2013-12-25 ENCOUNTER — Other Ambulatory Visit: Payer: Self-pay | Admitting: Family Medicine

## 2013-12-25 DIAGNOSIS — F411 Generalized anxiety disorder: Secondary | ICD-10-CM

## 2013-12-26 ENCOUNTER — Encounter: Payer: Self-pay | Admitting: Family Medicine

## 2013-12-26 NOTE — Telephone Encounter (Signed)
Dr.knapp is this okay to refill

## 2013-12-26 NOTE — Telephone Encounter (Signed)
Refill done and sent message to pt to see how she is doing (she was supposed to let us know after a month or so after being on the higher dose)

## 2014-01-06 ENCOUNTER — Other Ambulatory Visit: Payer: Self-pay | Admitting: Family Medicine

## 2014-01-07 ENCOUNTER — Other Ambulatory Visit: Payer: Self-pay | Admitting: Family Medicine

## 2014-01-31 ENCOUNTER — Other Ambulatory Visit: Payer: Self-pay | Admitting: Family Medicine

## 2014-02-14 ENCOUNTER — Ambulatory Visit (INDEPENDENT_AMBULATORY_CARE_PROVIDER_SITE_OTHER): Payer: Managed Care, Other (non HMO) | Admitting: Family Medicine

## 2014-02-14 ENCOUNTER — Encounter: Payer: Self-pay | Admitting: Family Medicine

## 2014-02-14 VITALS — BP 104/68 | HR 80 | Ht 64.25 in | Wt 249.0 lb

## 2014-02-14 DIAGNOSIS — Z23 Encounter for immunization: Secondary | ICD-10-CM

## 2014-02-14 DIAGNOSIS — K219 Gastro-esophageal reflux disease without esophagitis: Secondary | ICD-10-CM

## 2014-02-14 DIAGNOSIS — IMO0002 Reserved for concepts with insufficient information to code with codable children: Secondary | ICD-10-CM

## 2014-02-14 DIAGNOSIS — F325 Major depressive disorder, single episode, in full remission: Secondary | ICD-10-CM

## 2014-02-14 DIAGNOSIS — I10 Essential (primary) hypertension: Secondary | ICD-10-CM

## 2014-02-14 DIAGNOSIS — M179 Osteoarthritis of knee, unspecified: Secondary | ICD-10-CM

## 2014-02-14 DIAGNOSIS — E782 Mixed hyperlipidemia: Secondary | ICD-10-CM

## 2014-02-14 DIAGNOSIS — Z6841 Body Mass Index (BMI) 40.0 and over, adult: Secondary | ICD-10-CM

## 2014-02-14 DIAGNOSIS — F411 Generalized anxiety disorder: Secondary | ICD-10-CM

## 2014-02-14 DIAGNOSIS — E8881 Metabolic syndrome: Secondary | ICD-10-CM

## 2014-02-14 DIAGNOSIS — M1710 Unilateral primary osteoarthritis, unspecified knee: Secondary | ICD-10-CM

## 2014-02-14 LAB — POCT GLYCOSYLATED HEMOGLOBIN (HGB A1C): HEMOGLOBIN A1C: 5.2

## 2014-02-14 MED ORDER — BUPROPION HCL ER (XL) 300 MG PO TB24: ORAL_TABLET | ORAL | Status: DC

## 2014-02-14 MED ORDER — METFORMIN HCL ER 500 MG PO TB24: ORAL_TABLET | ORAL | Status: DC

## 2014-02-14 MED ORDER — CITALOPRAM HYDROBROMIDE 40 MG PO TABS: ORAL_TABLET | ORAL | Status: DC

## 2014-02-14 MED ORDER — ATORVASTATIN CALCIUM 40 MG PO TABS: 40.0000 mg | ORAL_TABLET | Freq: Every day | ORAL | Status: DC

## 2014-02-14 MED ORDER — LISINOPRIL-HYDROCHLOROTHIAZIDE 20-25 MG PO TABS: ORAL_TABLET | ORAL | Status: DC

## 2014-02-14 NOTE — Patient Instructions (Signed)
Keep up the good work. Stay on same medications.  If your triglycerides are higher, or liver tests are slightly abnormal, we might need to recheck sooner than 6 months. Try and limit alcohol intake (to max of 1-2 drinks), and not the night before a blood test :)

## 2014-02-14 NOTE — Progress Notes (Signed)
Chief Complaint  Patient presents with  . Hyperlipidemia    fasting med check.    Depression/anxiety:  Wellbutrin dose was increased in April, and moods have improved. No longer having crying spells. Emotions are under much better control.  She asked siblings for help with their dad, and that has helped ease her burden. Denies side effects.  Hyperlipidemia follow-up:  Patient is reportedly following a low-fat, low cholesterol diet.  Compliant with medications and denies medication side effects.  She admits to having 4 beers last night (out for a special occasion).  Hypertension follow-up: Blood pressures aren't checked elsewhere.  Denies dizziness (slight if working out hard, with high heart rate, and/or stands too quickly during something strenuous--very short-lived, not frequent).  Denies headaches, chest pain, edema. Denies side effects of medications. She has a dry cough, which started prior to BP meds and seems to be related to reflux, as cough is worse if she doesn't take prilosec. Cough is infrequent/tolerable.  PCOS/insulin resistance: Compliant with metformin.  Denies polydipsia, polyuria. Last eye appt was 1 year ago, no problems.   GERD--takes Prilosec daily. Can skip it for a few days if she avoids certain foods, including dairy. Cough recurs when she stops the prilosec. She takes it most days.   OA--L knee. She cut back on diclofenac from twice daily to once daily, and overall is doing well with this.  She uses tylenol arthritis prn. Denies abdominal pain.  MRI of L knee reportedly didn't show any tears, just arthritis and cartilage damage. Still has ongoing knee pain, but plateaued overall.  She got some PT (1 session), and has lost weight. She had 1 cortisone injection, and full series of 3 of the Synvisc; doesn't report much benefit.  Asthma and allergies--well controlled on her current regimen. She sees Dr. Joya Gaskins.  Not needing rescue inhaler.   Obesity:  She has lost about 14  pounds since her last visit 6 months ago. Most of this is related to moods being better, less emotional eating.  Past Medical History  Diagnosis Date  . Asthma     (Dr. Joya Gaskins)  . Obesity   . Hypertension   . GERD (gastroesophageal reflux disease)   . Allergic rhinitis, cause unspecified     cat/dog/ragweed/mold/wool, s/p immunotherapy x 3-4 yrs  . Hyperlipidemia   . OSA (obstructive sleep apnea)     on CPAP (Dr. Gwenette Greet)  . Depression 2006  . Anxiety 2006  . Colon polyps 2007    Dr. Oletta Lamas (she was told repeat in 10 years)  . Hemorrhoid     internal and external  . Endometriosis     Dr. Leo Grosser  . Insulin resistance     Dr. Leo Grosser  . Family history of breast cancer in female   . Medial meniscus tear     Left. Surgery [name].  NOT seen on MRI 06/2013   Past Surgical History  Procedure Laterality Date  . Cholecystectomy  1986  . Knee surgery  '95 and '97    BILATERAL KNEE  . Finger ganglion cyst excision  2003    RIGHT 3rd finger  . Lasik  2000  . Laparoscopy  '98, '01    endometriosis (Dr. Leo Grosser)  . Tibial tuberoplasty  90's    left   History   Social History  . Marital Status: Single    Spouse Name: N/A    Number of Children: 0  . Years of Education: N/A   Occupational History  .  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 Smoking status: Never Smoker   . Smokeless tobacco: Never Used  . Alcohol Use: Yes     Comment: 2-3 drinks a week, beer  . Drug Use: No  . Sexual Activity: No   Other Topics Concern  . Not on file   Social History Narrative   Lives alone, 2 cats.  Sister and father live in [place]    Outpatient Encounter Prescriptions as of 02/14/2014  Medication Sig Note  . acetaminophen (TYLENOL) 650 MG CR tablet Take 1,300 mg by mouth as needed.    . ALPRAZolam (XANAX) 0.5 MG tablet Take 0.5-1 tablets (0.25-0.5 mg total) by mouth 3 (three) times daily as needed for anxiety. 02/14/2014: Hasn't needed since increasing  Wellbutrin dose to 383m  . atorvastatin (LIPITOR) 40 MG tablet Take 1 tablet (40 mg total) by mouth daily.   .Marland KitchenbuPROPion (WELLBUTRIN XL) 300 MG 24 hr tablet take 1 tablet by mouth once daily   . Calcium Carbonate-Vitamin D (CALCIUM + D) 600-200 MG-UNIT TABS Take 2 tablets by mouth daily.     . cetirizine (ZYRTEC) 10 MG tablet Take 10 mg by mouth daily.     . citalopram (CELEXA) 40 MG tablet take 1 tablet by mouth once daily   . cyclobenzaprine (FLEXERIL) 5 MG tablet as needed. 02/14/2014: Takes prn back pain (last rx'd by Dr. HLeo Grosser also helps with endometriosis pain)  . diclofenac (VOLTAREN) 75 MG EC tablet Take 75 mg by mouth daily.   . fish oil-omega-3 fatty acids 1000 MG capsule Take 3 g by mouth daily.    . fluticasone (FLONASE) 50 MCG/ACT nasal spray INSTILL 1 TO 2 SPRAYS INTO EACH NOSTRIL ONCE A DAY   . Fluticasone-Salmeterol (ADVAIR DISKUS) 250-50 MCG/DOSE AEPB inhale 1 dose by mouth twice a day as directed   . HYDROcodone-acetaminophen (NORCO/VICODIN) 5-325 MG per tablet Take 1 tablet by mouth every 6 (six) hours as needed. 02/14/2014: Uses about once a week, prn severe knee pain  . lisinopril-hydrochlorothiazide (PRINZIDE,ZESTORETIC) 20-25 MG per tablet take 1 tablet by mouth once daily   . medroxyPROGESTERone (PROVERA) 10 MG tablet Take one tablet two times daily 02/14/2014: 1 tablet daily  . metFORMIN (GLUCOPHAGE-XR) 500 MG 24 hr tablet TAKE 2 TABLETS BY MOUTH EVERY MORNING AND 1 TABLET EVERY EVENING   . Multiple Vitamins-Minerals (MULTIVITAMIN WITH MINERALS) tablet Take 1 tablet by mouth daily.     .Marland Kitchenomeprazole (PRILOSEC) 20 MG capsule Take 20 mg by mouth daily.     . vitamin B-12 (CYANOCOBALAMIN) 1000 MCG tablet Take 1,000 mcg by mouth daily.     . vitamin C (ASCORBIC ACID) 500 MG tablet Take 500 mg by mouth daily.   .Marland Kitchenzinc gluconate 50 MG tablet Take 50 mg by mouth daily.     . [DISCONTINUED] diclofenac (VOLTAREN) 75 MG EC tablet Take 1 tablet (75 mg total) by mouth 2 (two) times  daily. 09/13/2013: Taking once daily   No Known Allergies  ROS:  No headaches, nausea, vomiting, diarrhea, fever, chills, chest pain, shortness of breath, bleeding, bruising, rashes.  Moods are better.  L knee pain and intermittent back pain.  No edema or other concerns. Wart on her right lower extremity that isn't responding to OTC treatments.  See HPI  PHYSICAL EXAM: BP 104/68  Pulse 80  Ht 5' 4.25" (1.632 m)  Wt 249 lb (112.946 kg)  BMI 42.41 kg/m2 Well developed, pleasant, obese female in no distress.  HEENT: PERRL, EOMI, conjunctiva clear. Neck: no lymphadenopathy, thyromegaly or mass  Heart: regular rate and rhythm  Lungs: clear bilaterally  Back: no CVA or spinal tenderness  Abdomen: soft, nontender, no organomegaly or mass  Extremities: 2+ pulse, no edema  Psych: Normal mood, affect, hygiene and grooming. Normal speech, eye contact. Neuro: alert and oriented. Cranial nerves intact. Normal gait, strength  Skin: hyperkeratotic lesions on RLE  Lab Results  Component Value Date   HGBA1C 5.2 02/14/2014   ASSESSMENT/PLAN:  HYPERTENSION - well controlled - Plan: Comprehensive metabolic panel, lisinopril-hydrochlorothiazide (PRINZIDE,ZESTORETIC) 20-25 MG per tablet  Insulin resistance syndrome - Plan: HgB A1c, metFORMIN (GLUCOPHAGE-XR) 500 MG 24 hr tablet  Morbid obesity with body mass index of 45.0-49.9 in adult  ANXIETY - Plan: buPROPion (WELLBUTRIN XL) 300 MG 24 hr tablet, citalopram (CELEXA) 40 MG tablet  Gastroesophageal reflux disease, esophagitis presence not specified - controlled  Mixed hyperlipidemia - Plan: Lipid panel, Comprehensive metabolic panel, atorvastatin (LIPITOR) 40 MG tablet  Osteoarthritis of knee, unspecified laterality, unspecified osteoarthritis type  Depression, major, in remission - Plan: buPROPion (WELLBUTRIN XL) 300 MG 24 hr tablet, citalopram (CELEXA) 40 MG tablet  Need for prophylactic vaccination and inoculation against influenza - Plan:  Flu Vaccine QUAD 36+ mos PF IM (Fluarix Quad PF)  Expect possibility of TG and liver tests up due to beer last night; might need to recheck sooner than 6 months if significantly elevated.  c-met, lipid Flu shot today   Return for treatment of wart on her RLE F/u 6 months for med check

## 2014-02-15 LAB — COMPREHENSIVE METABOLIC PANEL
ALT: 32 U/L (ref 0–35)
AST: 29 U/L (ref 0–37)
Albumin: 4.8 g/dL (ref 3.5–5.2)
Alkaline Phosphatase: 61 U/L (ref 39–117)
BUN: 25 mg/dL — ABNORMAL HIGH (ref 6–23)
CO2: 23 mEq/L (ref 19–32)
Calcium: 9.7 mg/dL (ref 8.4–10.5)
Chloride: 106 mEq/L (ref 96–112)
Creat: 1.16 mg/dL — ABNORMAL HIGH (ref 0.50–1.10)
Glucose, Bld: 94 mg/dL (ref 70–99)
Potassium: 4.6 mEq/L (ref 3.5–5.3)
Sodium: 141 mEq/L (ref 135–145)
Total Bilirubin: 0.4 mg/dL (ref 0.2–1.2)
Total Protein: 7.6 g/dL (ref 6.0–8.3)

## 2014-02-15 LAB — LIPID PANEL
CHOL/HDL RATIO: 3.2 ratio
CHOLESTEROL: 146 mg/dL (ref 0–200)
HDL: 46 mg/dL (ref >39)
LDL Cholesterol: 84 mg/dL (ref 0–99)
Triglycerides: 80 mg/dL (ref <150)
VLDL: 16 mg/dL (ref 0–40)

## 2014-03-07 ENCOUNTER — Encounter: Payer: Self-pay | Admitting: Critical Care Medicine

## 2014-03-07 ENCOUNTER — Ambulatory Visit (INDEPENDENT_AMBULATORY_CARE_PROVIDER_SITE_OTHER): Payer: Managed Care, Other (non HMO) | Admitting: Critical Care Medicine

## 2014-03-07 VITALS — BP 108/66 | HR 71 | Temp 98.1°F | Ht 64.0 in | Wt 252.6 lb

## 2014-03-07 DIAGNOSIS — J4540 Moderate persistent asthma, uncomplicated: Secondary | ICD-10-CM

## 2014-03-07 DIAGNOSIS — J45909 Unspecified asthma, uncomplicated: Secondary | ICD-10-CM

## 2014-03-07 NOTE — Patient Instructions (Signed)
No change in medications. Return in        6 months        

## 2014-03-07 NOTE — Progress Notes (Signed)
Subjective:    Patient ID: Susan Gould, female    DOB: 24-Dec-1965, 48 y.o.   MRN: [id number]  HPI  48 y.o. WF with moderate persistent asthma    03/07/2014 Chief Complaint  Patient presents with  . 6 month follow up    Clearing throat more frequently x 1 month otherwise feeling well overall.  No SOB, wheezing, or chest tightness/pain.  Doing well, just AM cough , does not expectorate.  No real wheezing.  No real chest tightness. No real pndrip.     PUL ASTHMA HISTORY 03/07/2014 08/07/2013 03/07/2013 10/14/2011  Symptoms 0-2 days/week 0-2 days/week 0-2 days/week 0-2 days/week  Nighttime awakenings 0-2/month 0-2/month 0-2/month 0-2/month  Interference with activity No limitations No limitations No limitations No limitations  SABA use 0-2 days/wk 0-2 days/wk 0-2 days/wk 0-2 days/wk  Exacerbations requiring oral steroids 0-1 / year 0-1 / year 0-1 / year 0-1 / year   BMI still high focused on diet     Review of Systems  Constitutional:   No  weight loss, night sweats,  Fevers, chills, fatigue, lassitude. HEENT:   No headaches,  Difficulty swallowing,  Tooth/dental problems,  Sore throat,                No sneezing, itching, ear ache,+++ nasal congestion, post nasal drip,   CV:  No chest pain,  Orthopnea, PND, swelling in lower extremities, anasarca, dizziness, palpitations  GI  Notes  Heartburn,++ indigestion, abdominal pain, nausea, vomiting, diarrhea, change in bowel habits, loss of appetite  Resp: No shortness of breath with exertion or at rest.  No excess mucus, no productive cough,  Notes  non-productive cough,  No coughing up of blood.  No change in color of mucus.  No wheezing.  No chest wall deformity  Skin: no rash or lesions.  GU: no dysuria, change in color of urine, no urgency or frequency.  No flank pain.  MS:  No joint pain or swelling.  No decreased range of motion.  No back pain.  Psych:  No change in mood or affect. No depression or anxiety.  No memory  loss.     Objective:   Physical Exam   Filed Vitals:   03/07/14 1116  BP: 108/66  Pulse: 71  Temp: 98.1 F (36.7 C)  TempSrc: Oral  Height: 5' 4"  (1.626 m)  Weight: 252 lb 9.6 oz (114.579 kg)  SpO2: 98%    Gen: Pleasant, well-nourished, in no distress,  normal affect  ENT: No lesions,  mouth clear,  oropharynx clear, no postnasal drip  Neck: No JVD, no TMG, no carotid bruits  Lungs: No use of accessory muscles, no dullness to percussion, clear without rales or rhonchi  Cardiovascular: RRR, heart sounds normal, no murmur or gallops, no peripheral edema  Abdomen: soft and NT, no HSM,  BS normal  Musculoskeletal: No deformities, no cyanosis or clubbing  Neuro: alert, non focal  Skin: Warm, no lesions or rashes        Assessment & Plan:   Moderate persistent asthma in adult without complication Moderate persistent asthma without complication in stable at this time Plan No change in medications. Return in     6 months   Severe obesity We discussed dietary options   Updated Medication List Outpatient Encounter Prescriptions as of 03/07/2014  Medication Sig  . acetaminophen (TYLENOL) 650 MG CR tablet Take 1,300 mg by mouth as needed.   . ALPRAZolam (XANAX) 0.5 MG tablet Take 0.5-1 tablets (0.25-0.5 mg  total) by mouth 3 (three) times daily as needed for anxiety.  Marland Kitchen atorvastatin (LIPITOR) 40 MG tablet Take 1 tablet (40 mg total) by mouth daily.  Marland Kitchen buPROPion (WELLBUTRIN XL) 300 MG 24 hr tablet take 1 tablet by mouth once daily  . Calcium Carbonate-Vitamin D (CALCIUM + D) 600-200 MG-UNIT TABS Take 2 tablets by mouth daily.    . cetirizine (ZYRTEC) 10 MG tablet Take 10 mg by mouth daily.    . citalopram (CELEXA) 40 MG tablet take 1 tablet by mouth once daily  . cyclobenzaprine (FLEXERIL) 5 MG tablet as needed.  . diclofenac (VOLTAREN) 75 MG EC tablet Take 75 mg by mouth daily.  . fish oil-omega-3 fatty acids 1000 MG capsule Take 3 g by mouth daily.   . fluticasone  (FLONASE) 50 MCG/ACT nasal spray INSTILL 1 TO 2 SPRAYS INTO EACH NOSTRIL ONCE A DAY  . Fluticasone-Salmeterol (ADVAIR DISKUS) 250-50 MCG/DOSE AEPB inhale 1 dose by mouth twice a day as directed  . HYDROcodone-acetaminophen (NORCO/VICODIN) 5-325 MG per tablet Take 1 tablet by mouth every 6 (six) hours as needed.  Marland Kitchen lisinopril-hydrochlorothiazide (PRINZIDE,ZESTORETIC) 20-25 MG per tablet take 1 tablet by mouth once daily  . medroxyPROGESTERone (PROVERA) 10 MG tablet Take 10 mg by mouth daily.  . metFORMIN (GLUCOPHAGE-XR) 500 MG 24 hr tablet TAKE 2 TABLETS BY MOUTH EVERY MORNING AND 1 TABLET EVERY EVENING  . Multiple Vitamins-Minerals (MULTIVITAMIN WITH MINERALS) tablet Take 1 tablet by mouth daily.    Marland Kitchen omeprazole (PRILOSEC) 20 MG capsule Take 20 mg by mouth daily.    . vitamin B-12 (CYANOCOBALAMIN) 1000 MCG tablet Take 1,000 mcg by mouth daily.    . vitamin C (ASCORBIC ACID) 500 MG tablet Take 500 mg by mouth daily.  Marland Kitchen zinc gluconate 50 MG tablet Take 50 mg by mouth daily.    . [DISCONTINUED] medroxyPROGESTERone (PROVERA) 10 MG tablet Take one tablet two times daily

## 2014-03-08 NOTE — Assessment & Plan Note (Addendum)
Moderate persistent asthma without complication in stable at this time Plan No change in medications. Return in     6 months

## 2014-03-19 ENCOUNTER — Encounter: Payer: Self-pay | Admitting: Critical Care Medicine

## 2014-03-19 NOTE — Telephone Encounter (Signed)
I received a letter from [employer], [place], stating the Advair I have been on for many years is not a preferred drug. They are suggesting I use Symbicort, Dulera or Spiriva as an alternative. In looking at the costs per drug, it seems Ruthe Mannan would be more economical for me.   Please let me know if Dr. Joya Gaskins feels if any of these are a good alternative to Advair. If not, my insurance company requires a letter from him asking for an "exception."  If any of these three are acceptable, could you please call in the prescription to my pharmacy, [facility] on [address]?  Thanks so much for you time and help!  Please advise. Thornton Bing, CMA

## 2014-03-21 MED ORDER — MOMETASONE FURO-FORMOTEROL FUM 100-5 MCG/ACT IN AERO: 2.0000 | INHALATION_SPRAY | Freq: Two times a day (BID) | RESPIRATORY_TRACT | Status: DC

## 2014-03-21 NOTE — Telephone Encounter (Signed)
Dulera 100 rx sent to [facility].  Email sent to pt.

## 2014-04-19 ENCOUNTER — Encounter: Payer: Self-pay | Admitting: Critical Care Medicine

## 2014-04-19 ENCOUNTER — Other Ambulatory Visit (HOSPITAL_COMMUNITY): Payer: Self-pay | Admitting: Obstetrics and Gynecology

## 2014-04-19 DIAGNOSIS — Z1231 Encounter for screening mammogram for malignant neoplasm of breast: Secondary | ICD-10-CM

## 2014-05-02 ENCOUNTER — Encounter: Payer: Self-pay | Admitting: Family Medicine

## 2014-05-02 ENCOUNTER — Ambulatory Visit (INDEPENDENT_AMBULATORY_CARE_PROVIDER_SITE_OTHER): Payer: Managed Care, Other (non HMO) | Admitting: Family Medicine

## 2014-05-02 VITALS — BP 120/70 | HR 80 | Temp 98.7°F | Ht 64.5 in | Wt 250.0 lb

## 2014-05-02 DIAGNOSIS — R945 Abnormal results of liver function studies: Secondary | ICD-10-CM

## 2014-05-02 DIAGNOSIS — R1011 Right upper quadrant pain: Secondary | ICD-10-CM

## 2014-05-02 DIAGNOSIS — R7989 Other specified abnormal findings of blood chemistry: Secondary | ICD-10-CM

## 2014-05-02 LAB — POCT URINALYSIS DIPSTICK
Bilirubin, UA: NEGATIVE
Blood, UA: NEGATIVE
Glucose, UA: NEGATIVE
KETONES UA: NEGATIVE
LEUKOCYTES UA: NEGATIVE
NITRITE UA: NEGATIVE
PH UA: 5
Protein, UA: NEGATIVE
Spec Grav, UA: <=1.005
Urobilinogen, UA: NEGATIVE

## 2014-05-02 LAB — HEPATIC FUNCTION PANEL
ALK PHOS: 61 U/L (ref 39–117)
ALT: 25 U/L (ref 0–35)
AST: 30 U/L (ref 0–37)
Albumin: 4.7 g/dL (ref 3.5–5.2)
BILIRUBIN DIRECT: 0.1 mg/dL (ref 0.0–0.3)
Indirect Bilirubin: 0.3 mg/dL (ref 0.2–1.2)
Total Bilirubin: 0.4 mg/dL (ref 0.2–1.2)
Total Protein: 7.4 g/dL (ref 6.0–8.3)

## 2014-05-02 LAB — LIPASE: LIPASE: 56 U/L (ref 0–75)

## 2014-05-02 NOTE — Progress Notes (Signed)
Chief Complaint  Patient presents with  . Flank Pain    ruq pain x 9 months but over the last 7 days has worsened, except for when she is lying down. Has the pain when she is full, when she is hungry.    Pain had been vague, off and on, over the last 9 months.  Pain has gotten worse in the last week. Pain is located at RUQ, doesn't radiate elsewhere.  No pain when supine.  No change in pain related to eating.  It is a little worse with activity (jumping around). She had a brief episode one morning with nausea (only lasted 3 minutes), no vomiting. She is still doing boot camp.  Denies any change in activity, or any injury.   Doesn't hurt to touch the area. It is the time of year for allergies, not flaring badly.  Slight discomfort with cough.  She hasn't taken anything for the pain.  Pain is "nagging", 4-5/10 over the last week  She is s/p cholecystectomy in 1986. This pain feels similar, except not related to eating as it was prior to her surgery.  H/o elevated LFT's in the past, but was normal on recent check. She donated blood 5-6 years ago (and therefore has had Hep C testing in the past, never got letter stating anything was abnormal).  PMH, PSH, SH reviewed. Meds reconciled, reviewed. No Known Allergies  ROS:  No fevers, chills, weight changes, vomiting, bowel changes, bleeding, bruising, rashes. No significant allergy symptoms/flare, no shortness of breath, chest pain. No edema.  No urinary complaints. See HPI  PHYSICAL EXAM: BP 120/70 mmHg  Pulse 80  Temp(Src) 98.7 F (37.1 C) (Tympanic)  Ht 5' 4.5" (1.638 m)  Wt 250 lb (113.399 kg)  BMI 42.27 kg/m2 Well developed, pleasant female, obese; in no distress, appears comfortable Neck: no lymphadenopathy or mass Heart: regular rate and rhythm Lungs: clear bilaterally Abdomen: soft, normal bowel sounds.no organomegaly or mass.  nontender. Area of discomfort is at inferior ribs, extending laterally; nontender to palpation  Urine  dip normal  ASSESSMENT/PLAN:  RUQ pain - suspect MSK etiology; given h/o LFT elevation in past, recheck labs; consider u/s. Treat for MSK source with increasing voltaren to BID, heat - Plan: POCT Urinalysis Dipstick, Hepatic function panel, Lipase  Elevated LFTs - Plan: Hepatic function panel  Trial of heat; increase diclofenac to twice daily (with food) for a week.  Rest (ie no twisting, crunches, oblique exercises, etc). If your pain doesn't improve, then call us to schedule right upper quadrant ultrasound.  U/s liver--elects to hold off; will call to have it scheduled if pain doesn't improve with other measures, and if liver tests are normal  Lft's, lipase

## 2014-05-02 NOTE — Patient Instructions (Addendum)
Trial of heat; increase diclofenac to twice daily (with food) for a week.  Rest (ie no twisting, crunches, oblique exercises, etc). If your pain doesn't improve, then call us to schedule right upper quadrant ultrasound.  At your convenience, call Dr. Oletta Lamas to schedule routine colonoscopy for colon cancer screening.

## 2014-05-09 ENCOUNTER — Ambulatory Visit (HOSPITAL_COMMUNITY)
Admission: RE | Admit: 2014-05-09 | Discharge: 2014-05-09 | Disposition: A | Discharge status: Home / Self Care | Payer: Managed Care, Other (non HMO) | Source: Ambulatory Visit | Attending: Obstetrics and Gynecology | Admitting: Obstetrics and Gynecology

## 2014-05-09 ENCOUNTER — Ambulatory Visit (HOSPITAL_COMMUNITY): Payer: Managed Care, Other (non HMO)

## 2014-05-09 DIAGNOSIS — Z1231 Encounter for screening mammogram for malignant neoplasm of breast: Secondary | ICD-10-CM | POA: Diagnosis not present

## 2014-05-14 ENCOUNTER — Encounter: Payer: Self-pay | Admitting: Family Medicine

## 2014-06-04 ENCOUNTER — Encounter: Payer: Self-pay | Admitting: Family Medicine

## 2014-06-04 ENCOUNTER — Telehealth: Payer: Self-pay | Admitting: *Deleted

## 2014-06-04 MED ORDER — DICLOFENAC SODIUM 75 MG PO TBEC: 75.0000 mg | DELAYED_RELEASE_TABLET | Freq: Two times a day (BID) | ORAL | Status: DC

## 2014-06-04 NOTE — Telephone Encounter (Signed)
done

## 2014-06-04 NOTE — Telephone Encounter (Signed)
Would like refill on diclofenac sent to [facility] and [place].

## 2014-08-11 ENCOUNTER — Other Ambulatory Visit: Payer: Self-pay | Admitting: Family Medicine

## 2014-08-13 ENCOUNTER — Telehealth: Payer: Self-pay | Admitting: Internal Medicine

## 2014-08-13 NOTE — Telephone Encounter (Signed)
[facility]  [facility]m [facility]

## 2014-08-16 ENCOUNTER — Encounter: Payer: Self-pay | Admitting: Family Medicine

## 2014-08-16 ENCOUNTER — Ambulatory Visit (INDEPENDENT_AMBULATORY_CARE_PROVIDER_SITE_OTHER): Payer: Managed Care, Other (non HMO) | Admitting: Family Medicine

## 2014-08-16 VITALS — BP 108/70 | HR 80 | Ht 64.0 in | Wt 254.0 lb

## 2014-08-16 DIAGNOSIS — F324 Major depressive disorder, single episode, in partial remission: Secondary | ICD-10-CM

## 2014-08-16 DIAGNOSIS — E8881 Metabolic syndrome: Secondary | ICD-10-CM

## 2014-08-16 DIAGNOSIS — Z5181 Encounter for therapeutic drug level monitoring: Secondary | ICD-10-CM

## 2014-08-16 DIAGNOSIS — S90229A Contusion of unspecified lesser toe(s) with damage to nail, initial encounter: Secondary | ICD-10-CM

## 2014-08-16 DIAGNOSIS — E782 Mixed hyperlipidemia: Secondary | ICD-10-CM

## 2014-08-16 DIAGNOSIS — D649 Anemia, unspecified: Secondary | ICD-10-CM

## 2014-08-16 DIAGNOSIS — M179 Osteoarthritis of knee, unspecified: Secondary | ICD-10-CM

## 2014-08-16 DIAGNOSIS — I10 Essential (primary) hypertension: Secondary | ICD-10-CM

## 2014-08-16 DIAGNOSIS — M1710 Unilateral primary osteoarthritis, unspecified knee: Secondary | ICD-10-CM

## 2014-08-16 DIAGNOSIS — K219 Gastro-esophageal reflux disease without esophagitis: Secondary | ICD-10-CM

## 2014-08-16 DIAGNOSIS — F325 Major depressive disorder, single episode, in full remission: Secondary | ICD-10-CM

## 2014-08-16 DIAGNOSIS — F411 Generalized anxiety disorder: Secondary | ICD-10-CM

## 2014-08-16 DIAGNOSIS — Z6841 Body Mass Index (BMI) 40.0 and over, adult: Secondary | ICD-10-CM

## 2014-08-16 LAB — CBC WITH DIFFERENTIAL/PLATELET
BASOS ABS: 0 10*3/uL (ref 0.0–0.1)
BASOS PCT: 0 % (ref 0–1)
EOS ABS: 0.2 10*3/uL (ref 0.0–0.7)
EOS PCT: 3 % (ref 0–5)
HEMATOCRIT: 36.8 % (ref 36.0–46.0)
Hemoglobin: 12.3 g/dL (ref 12.0–15.0)
LYMPHS ABS: 2.9 10*3/uL (ref 0.7–4.0)
LYMPHS PCT: 44 % (ref 12–46)
MCH: 30.6 pg (ref 26.0–34.0)
MCHC: 33.4 g/dL (ref 30.0–36.0)
MCV: 91.5 fL (ref 78.0–100.0)
MONO ABS: 0.5 10*3/uL (ref 0.1–1.0)
MONOS PCT: 8 % (ref 3–12)
MPV: 10.1 fL (ref 8.6–12.4)
NEUTROS ABS: 3 10*3/uL (ref 1.7–7.7)
Neutrophils Relative %: 45 % (ref 43–77)
Platelets: 261 10*3/uL (ref 150–400)
RBC: 4.02 MIL/uL (ref 3.87–5.11)
RDW: 13.4 % (ref 11.5–15.5)
WBC: 6.6 10*3/uL (ref 4.0–10.5)

## 2014-08-16 LAB — COMPREHENSIVE METABOLIC PANEL
ALT: 42 U/L — ABNORMAL HIGH (ref 0–35)
AST: 50 U/L — ABNORMAL HIGH (ref 0–37)
Albumin: 4.7 g/dL (ref 3.5–5.2)
Alkaline Phosphatase: 65 U/L (ref 39–117)
BILIRUBIN TOTAL: 0.5 mg/dL (ref 0.2–1.2)
BUN: 24 mg/dL — ABNORMAL HIGH (ref 6–23)
CALCIUM: 9.9 mg/dL (ref 8.4–10.5)
CO2: 23 meq/L (ref 19–32)
CREATININE: 1.15 mg/dL — AB (ref 0.50–1.10)
Chloride: 105 mEq/L (ref 96–112)
Glucose, Bld: 87 mg/dL (ref 70–99)
Potassium: 4.6 mEq/L (ref 3.5–5.3)
Sodium: 138 mEq/L (ref 135–145)
TOTAL PROTEIN: 7.5 g/dL (ref 6.0–8.3)

## 2014-08-16 LAB — LIPID PANEL
Cholesterol: 147 mg/dL (ref 0–200)
HDL: 43 mg/dL (ref >39)
LDL CALC: 81 mg/dL (ref 0–99)
TRIGLYCERIDES: 117 mg/dL (ref <150)
Total CHOL/HDL Ratio: 3.4 Ratio
VLDL: 23 mg/dL (ref 0–40)

## 2014-08-16 LAB — POCT GLYCOSYLATED HEMOGLOBIN (HGB A1C): HEMOGLOBIN A1C: 5.2

## 2014-08-16 LAB — TSH: TSH: 1.175 u[IU]/mL (ref 0.350–4.500)

## 2014-08-16 MED ORDER — CITALOPRAM HYDROBROMIDE 40 MG PO TABS: ORAL_TABLET | ORAL | Status: DC

## 2014-08-16 MED ORDER — METFORMIN HCL ER 500 MG PO TB24: ORAL_TABLET | ORAL | Status: DC

## 2014-08-16 MED ORDER — ATORVASTATIN CALCIUM 40 MG PO TABS: 40.0000 mg | ORAL_TABLET | Freq: Every day | ORAL | Status: DC

## 2014-08-16 MED ORDER — BUPROPION HCL ER (XL) 300 MG PO TB24: ORAL_TABLET | ORAL | Status: DC

## 2014-08-16 MED ORDER — LISINOPRIL-HYDROCHLOROTHIAZIDE 20-25 MG PO TABS: ORAL_TABLET | ORAL | Status: DC

## 2014-08-16 NOTE — Progress Notes (Signed)
Chief Complaint  Patient presents with  . Hypertension    fasting med check.    She notes discoloration of both great toenails x 3-4 months.  Noticed this after a run on the treadmill.  She denies any pain.  She also notes numbness at the webspace between first and 2nd toes bilaterally, extending into the toes (lateral great toe, medial 2nd toe).  Numbness has been present x 3-4 months, since bruising. She has back pain chronically, no change. Pain radiates around laterally to hip, but not down into the leg. Described as tingling, no burning pain or weakness.  She is known to have some degenerative changes at L5-S1, has had prior back films (through ortho)  Seen 3 mos ago with RUQ pain--at the time we discussed the possibility of it being muscular and treated with diclofenac and heat. Liver tests and lipase were normal.  She realized that it was related to eating too much salad, only has pain now if she has too much salad.  Depression/anxiety: Wellbutrin dose was increased in April 2015, and moods have improved. No longer having crying spells. Emotions are under much better control. Her siblings have been helping with their dad, and that has helped ease her burden. Denies side effects.  She continues to do well on her current regimen.  Hyperlipidemia follow-up: Patient is reportedly following a low-fat, low cholesterol diet. Compliant with medications and denies medication side effects.  Hypertension follow-up: Blood pressures aren't checked elsewhere. Denies dizziness (slight if working out hard, with high heart rate, and/or stands too quickly during something strenuous--very short-lived, not frequent). Denies headaches, chest pain, edema. Denies side effects of medications. She has a dry cough, which started prior to BP meds and seems to be related to reflux, as cough is worse if she doesn't take prilosec. Cough is infrequent/tolerable.  PCOS/insulin resistance: Compliant with metformin.  Denies polydipsia, polyuria. Last eye appt was 2 years ago.  GERD--takes Prilosec daily. Can skip it for a few days if she avoids certain foods, including dairy. Cough recurs when she stops the prilosec. She takes it most days, 5/7 days of the week. Denies dysphagia.  OA--L knee. She is taking diclofenac once daily, and overall is doing well with this. She uses tylenol arthritis prn.  MRI of L knee reportedly didn't show any tears, just arthritis and cartilage damage. Still has ongoing knee pain, but plateaued overall. Prior to her last visit 6 months ago, she got some PT (1 session), and had lost weight. She had 1 cortisone injection, and full series of 3 of the Synvisc; doesn't report much benefit. Overall things are stable and manageable.  Asthma and allergies--well controlled on her current regimen. She sees Dr. Joya Gaskins. Not needing rescue inhaler.   Obesity:She has gained 4 pounds since her last visit 3 months ago.  Admits that her portion sizes have gone up some.  She continues to follow a regular exercise regimen.  Health maintenance:  She had a colonoscopy --can't recall date ('05 or '07, Dr. Oletta Lamas); UTD with GYN. Immunization History  Administered Date(s) Administered  . Influenza Split 04/17/2012  . Influenza Whole 03/29/2009, 03/17/2010, 04/23/2011  . Influenza,inj,Quad PF,36+ Mos 03/07/2013, 02/14/2014  . Pneumococcal Polysaccharide-23 03/07/2013  . Td 08/14/2004  . Tdap 07/29/2011   PMH, PSH, SH reviewed/updated  Outpatient Encounter Prescriptions as of 08/16/2014  Medication Sig Note  . ALPRAZolam (XANAX) 0.5 MG tablet Take 0.5-1 tablets (0.25-0.5 mg total) by mouth 3 (three) times daily as needed for anxiety.  02/14/2014: Hasn't needed since increasing Wellbutrin dose to 361m  . atorvastatin (LIPITOR) 40 MG tablet Take 1 tablet (40 mg total) by mouth daily.   .Marland KitchenbuPROPion (WELLBUTRIN XL) 300 MG 24 hr tablet take 1 tablet by mouth once daily   . Calcium Carbonate-Vitamin  D (CALCIUM + D) 600-200 MG-UNIT TABS Take 2 tablets by mouth daily.     . cetirizine (ZYRTEC) 10 MG tablet Take 10 mg by mouth daily.     . citalopram (CELEXA) 40 MG tablet take 1 tablet by mouth once daily   . diclofenac (VOLTAREN) 75 MG EC tablet Take 1 tablet (75 mg total) by mouth 2 (two) times daily. 08/16/2014: Takes once daily  . fish oil-omega-3 fatty acids 1000 MG capsule Take 3 g by mouth daily.    . fluticasone (FLONASE) 50 MCG/ACT nasal spray INSTILL 1 TO 2 SPRAYS INTO EACH NOSTRIL ONCE A DAY   . lisinopril-hydrochlorothiazide (PRINZIDE,ZESTORETIC) 20-25 MG per tablet take 1 tablet by mouth once daily   . medroxyPROGESTERone (PROVERA) 10 MG tablet Take 10 mg by mouth daily.   . metFORMIN (GLUCOPHAGE-XR) 500 MG 24 hr tablet TAKE 2 TABLETS BY MOUTH EVERY MORNING AND 1 TABLET EVERY EVENING   . mometasone-formoterol (DULERA) 100-5 MCG/ACT AERO Inhale 2 puffs into the lungs 2 (two) times daily.   . Multiple Vitamins-Minerals (MULTIVITAMIN WITH MINERALS) tablet Take 1 tablet by mouth daily.     .Marland Kitchenomeprazole (PRILOSEC) 20 MG capsule Take 20 mg by mouth daily.     .Marland KitchenpyridOXINE (B-6) 50 MG tablet Take 50 mg by mouth daily.   . vitamin B-12 (CYANOCOBALAMIN) 1000 MCG tablet Take 1,000 mcg by mouth daily.     . vitamin C (ASCORBIC ACID) 500 MG tablet Take 500 mg by mouth daily.   .Marland Kitchenzinc gluconate 50 MG tablet Take 50 mg by mouth daily.     . [DISCONTINUED] atorvastatin (LIPITOR) 40 MG tablet Take 1 tablet (40 mg total) by mouth daily.   . [DISCONTINUED] buPROPion (WELLBUTRIN XL) 300 MG 24 hr tablet take 1 tablet by mouth once daily   . [DISCONTINUED] citalopram (CELEXA) 40 MG tablet take 1 tablet by mouth once daily   . [DISCONTINUED] lisinopril-hydrochlorothiazide (PRINZIDE,ZESTORETIC) 20-25 MG per tablet take 1 tablet by mouth once daily   . [DISCONTINUED] metFORMIN (GLUCOPHAGE-XR) 500 MG 24 hr tablet TAKE 2 TABLETS BY MOUTH EVERY MORNING AND 1 TABLET EVERY EVENING   . acetaminophen (TYLENOL)  650 MG CR tablet Take 1,300 mg by mouth as needed.    . cyclobenzaprine (FLEXERIL) 5 MG tablet as needed. 02/14/2014: Takes prn back pain (last rx'd by Dr. HLeo Grosser also helps with endometriosis pain)  . [DISCONTINUED] Fluticasone-Salmeterol (ADVAIR DISKUS) 250-50 MCG/DOSE AEPB inhale 1 dose by mouth twice a day as directed    No Known Allergies  ROS: No headaches, nausea, vomiting, diarrhea, fever, chills, chest pain, shortness of breath, bleeding, bruising, rashes. Moods are better. L knee pain and intermittent back pain as per HPI,stable. No edema or other concerns. Wart on her right lower extremity unchanged, hadn't been interested in getting treatment.  See HPI  PHYSICAL EXAM: BP 108/70 mmHg  Pulse 80  Ht 5' 4"  (1.626 m)  Wt 254 lb (115.214 kg)  BMI 43.58 kg/m2   Well developed, pleasant, obese female in no distress.  HEENT: PERRL, EOMI, conjunctiva clear. Neck: no lymphadenopathy, thyromegaly or mass  Heart: regular rate and rhythm without murmur Lungs: clear bilaterally, good air movement Back: no CVA or spinal  tenderness  Abdomen: soft, nontender, no organomegaly or mass  Extremities: 2+ pulse, no edema.  Bruising noted at lateral great toes bilaterally.  There is 45m of clear area at the base of the nail (where it has been growing in). Subjective decrease in sensation at the web space and lateral great toe, medial 2nd toe Psych: Normal mood, affect, hygiene and grooming. Normal speech, eye contact. Neuro: alert and oriented. Cranial nerves intact. Normal gait, strength   Lab Results  Component Value Date   HGBA1C 5.2 08/16/2014   ASSESSMENT/PLAN:  Essential hypertension - well controlled - Plan: Comprehensive metabolic panel, lisinopril-hydrochlorothiazide (PRINZIDE,ZESTORETIC) 20-25 MG per tablet  Insulin resistance syndrome - continue metformin - Plan: pyridOXINE (B-6) 50 MG tablet, HgB A1c, metFORMIN (GLUCOPHAGE-XR) 500 MG 24 hr tablet  Morbid obesity with  body mass index of 45.0-49.9 in adult - weight loss encouraged--cut back on portions, continue exercise - Plan: TSH  Gastroesophageal reflux disease, esophagitis presence not specified - controlled  Mixed hyperlipidemia - Plan: Lipid panel, atorvastatin (LIPITOR) 40 MG tablet  Osteoarthritis of knee, unspecified laterality, unspecified osteoarthritis type - stable overall.  weight loss will help  Medication monitoring encounter - Plan: Comprehensive metabolic panel, Lipid panel, CBC with Differential/Platelet  Anxiety state - controlled - Plan: TSH, buPROPion (WELLBUTRIN XL) 300 MG 24 hr tablet, citalopram (CELEXA) 40 MG tablet  Anemia, unspecified anemia type - Plan: CBC with Differential/Platelet  Depression, major, in remission - Plan: buPROPion (WELLBUTRIN XL) 300 MG 24 hr tablet, citalopram (CELEXA) 40 MG tablet  Subungual hemorrhage of toenail, unspecified laterality, initial encounter - bilateral great toenails.  some associated numbness since the injury. toenails growing in normally.  reassured  c-met, lipids, TSH ,CBC  Check with Dr. EOletta Lamasto see when last colonoscopy was, and when she is due.   F/u 6 months, sooner prn

## 2014-08-16 NOTE — Patient Instructions (Addendum)
Continue your current medications. Try and continue to lose weight by watching portion sizes and diet. Continue your regular exercise. Let us know if the area of numbness is increasing, or if you develop any weakness in the foot.,  Check with Dr. Oletta Lamas to see when you are due for another colonoscopy for screening purposes.

## 2014-09-27 ENCOUNTER — Ambulatory Visit (INDEPENDENT_AMBULATORY_CARE_PROVIDER_SITE_OTHER): Payer: Managed Care, Other (non HMO) | Admitting: Pulmonary Disease

## 2014-09-27 ENCOUNTER — Encounter: Payer: Self-pay | Admitting: Pulmonary Disease

## 2014-09-27 VITALS — BP 122/70 | HR 82 | Temp 98.2°F | Ht 64.0 in | Wt 255.0 lb

## 2014-09-27 DIAGNOSIS — G4733 Obstructive sleep apnea (adult) (pediatric): Secondary | ICD-10-CM

## 2014-09-27 NOTE — Progress Notes (Signed)
   Subjective:    Patient ID: Susan Gould, female    DOB: 1966-05-27, 49 y.o.   MRN: [id number]  HPI Patient comes in today for follow-up of her known obstructive sleep apnea. She is wearing C Pap compliantly, but is having issues with afternoon sleepiness with inactivity. Her C Pap device is approximately 75-29 years old, and was not replaced last year because of her high co-pay. It is unclear whether it is putting out adequate pressure, and we are unable to get a download because of its age. Otherwise, she feels that she sleeps fairly well, and is rested in the mornings upon arising. She does have some chronic pain issues during the night which can disrupt her sleep.   Review of Systems  Constitutional: Negative for fever and unexpected weight change.  HENT: Negative for congestion, dental problem, ear pain, nosebleeds, postnasal drip, rhinorrhea, sinus pressure, sneezing, sore throat and trouble swallowing.   Eyes: Negative for redness and itching.  Respiratory: Negative for cough, chest tightness, shortness of breath and wheezing.   Cardiovascular: Negative for palpitations and leg swelling.  Gastrointestinal: Negative for nausea and vomiting.  Genitourinary: Negative for dysuria.  Musculoskeletal: Negative for joint swelling.  Skin: Negative for rash.  Neurological: Negative for headaches.  Hematological: Does not bruise/bleed easily.  Psychiatric/Behavioral: Negative for dysphoric mood. The patient is not nervous/anxious.        Objective:   Physical Exam Overweight female in no acute distress Nose without purulence or discharge noted No skin breakdown or pressure necrosis from the C Pap mask Neck without lymphadenopathy or thyromegaly Lower extremities without edema, no cyanosis Alert and oriented, moves all 4 extremities.       Assessment & Plan:

## 2014-09-27 NOTE — Assessment & Plan Note (Signed)
The patient tells me that she is wearing C Pap compliantly, but is having afternoon sleepiness which impacts her quality of life. It is unclear if this is related to her sleep-disordered breathing, or something else such as restless sleep related to chronic pain or possibly medications. The patient does have an issue with musculoskeletal discomfort at night while sleeping. She has an aged CPAP machine that I think needs to be replaced, but she decided against it last year because of cost. She is interested in pursuing this again, but if she decides against it, we will need to get a download off her current device and also check its functioning. Finally, I've encouraged her to work aggressively on weight loss.

## 2014-09-27 NOTE — Patient Instructions (Signed)
Will order a new machine, and use the auto setting to treat your sleep apnea.  If you are having issues, let us know. If you decide against a new device, let us know so that we can get a download and have your machine function checked. Work on weight loss followup with me again in one year if doing well.

## 2014-10-01 ENCOUNTER — Encounter: Payer: Self-pay | Admitting: Critical Care Medicine

## 2014-10-01 ENCOUNTER — Ambulatory Visit (INDEPENDENT_AMBULATORY_CARE_PROVIDER_SITE_OTHER): Payer: Managed Care, Other (non HMO) | Admitting: Critical Care Medicine

## 2014-10-01 VITALS — BP 118/64 | HR 85 | Temp 99.1°F | Ht 64.0 in | Wt 258.2 lb

## 2014-10-01 DIAGNOSIS — J4540 Moderate persistent asthma, uncomplicated: Secondary | ICD-10-CM

## 2014-10-01 NOTE — Patient Instructions (Signed)
No change in medications. Return in        6 months        

## 2014-10-01 NOTE — Assessment & Plan Note (Signed)
Moderate persistent asthma stable at present Plan No change in inhaled or maintenance medications. Return in  6 months

## 2014-10-01 NOTE — Progress Notes (Signed)
Subjective:    Patient ID: Susan Gould, female    DOB: 02-13-1966, 49 y.o.   MRN: [id number]  HPI 49 y.o. WF with moderate persistent asthma    10/01/2014 Chief Complaint  Patient presents with  . Follow-up    Pt states breathing is doing very well, she has a morning cough with no mucus. Denies any SOB, wheezing, chest tightness, f/c/s or n/v/d.   No new issues.  Breathing well.  Pt denies any significant sore throat, nasal congestion or excess secretions, fever, chills, sweats, unintended weight loss, pleurtic or exertional chest pain, orthopnea PND, or leg swelling Pt denies any increase in rescue therapy over baseline, denies waking up needing it or having any early am or nocturnal exacerbations of coughing/wheezing/or dyspnea. Pt also denies any obvious fluctuation in symptoms with  weather or environmental change or other alleviating or aggravating factors    PUL ASTHMA HISTORY 10/01/2014 03/07/2014 08/07/2013 03/07/2013 10/14/2011  Symptoms 0-2 days/week 0-2 days/week 0-2 days/week 0-2 days/week 0-2 days/week  Nighttime awakenings 0-2/month 0-2/month 0-2/month 0-2/month 0-2/month  Interference with activity No limitations No limitations No limitations No limitations No limitations  SABA use 0-2 days/wk 0-2 days/wk 0-2 days/wk 0-2 days/wk 0-2 days/wk  Exacerbations requiring oral steroids 0-1 / year 0-1 / year 0-1 / year 0-1 / year 0-1 / year   BMI still high focused on diet     Review of Systems Constitutional:   No  weight loss, night sweats,  Fevers, chills, fatigue, lassitude. HEENT:   No headaches,  Difficulty swallowing,  Tooth/dental problems,  Sore throat,                No sneezing, itching, ear ache,+++ nasal congestion, post nasal drip,   CV:  No chest pain,  Orthopnea, PND, swelling in lower extremities, anasarca, dizziness, palpitations  GI  Notes + Heartburn,+indigestion, abdominal pain, nausea, vomiting, diarrhea, change in bowel habits, loss of appetite  Resp:  No shortness of breath with exertion or at rest.  No excess mucus, no productive cough,  Notes mild AM  non-productive cough,  No coughing up of blood.  No change in color of mucus.  No wheezing.  No chest wall deformity  Skin: no rash or lesions.  GU: no dysuria, change in color of urine, no urgency or frequency.  No flank pain.  MS:  No joint pain or swelling.  No decreased range of motion.  No back pain.  Psych:  No change in mood or affect. No depression or anxiety.  No memory loss.     Objective:   Physical Exam  Filed Vitals:   10/01/14 0914  BP: 118/64  Pulse: 85  Temp: 99.1 F (37.3 C)  TempSrc: Oral  Height: 5' 4"  (1.626 m)  Weight: 258 lb 3.2 oz (117.119 kg)  SpO2: 99%    Gen: Pleasant, well-nourished, in no distress,  normal affect  ENT: No lesions,  mouth clear,  oropharynx clear, no postnasal drip  Neck: No JVD, no TMG, no carotid bruits  Lungs: No use of accessory muscles, no dullness to percussion, clear without rales or rhonchi  Cardiovascular: RRR, heart sounds normal, no murmur or gallops, no peripheral edema  Abdomen: soft and NT, no HSM,  BS normal  Musculoskeletal: No deformities, no cyanosis or clubbing  Neuro: alert, non focal  Skin: Warm, no lesions or rashes        Assessment & Plan:   Moderate persistent asthma in adult without complication Moderate persistent asthma  stable at present Plan No change in inhaled or maintenance medications. Return in  6 months    Severe obesity We discussed dietary options   Updated Medication List Outpatient Encounter Prescriptions as of 10/01/2014  Medication Sig  . acetaminophen (TYLENOL) 650 MG CR tablet Take 1,300 mg by mouth as needed.   . ALPRAZolam (XANAX) 0.5 MG tablet Take 0.5-1 tablets (0.25-0.5 mg total) by mouth 3 (three) times daily as needed for anxiety.  Marland Kitchen atorvastatin (LIPITOR) 40 MG tablet Take 1 tablet (40 mg total) by mouth daily.  Marland Kitchen buPROPion (WELLBUTRIN XL) 300 MG 24 hr  tablet take 1 tablet by mouth once daily  . Calcium Carbonate-Vitamin D (CALCIUM + D) 600-200 MG-UNIT TABS Take 2 tablets by mouth daily.    . cetirizine (ZYRTEC) 10 MG tablet Take 10 mg by mouth daily.    . citalopram (CELEXA) 40 MG tablet take 1 tablet by mouth once daily  . cyclobenzaprine (FLEXERIL) 5 MG tablet as needed.  . diclofenac (VOLTAREN) 75 MG EC tablet Take 1 tablet (75 mg total) by mouth 2 (two) times daily.  . fish oil-omega-3 fatty acids 1000 MG capsule Take 3 g by mouth daily.   . fluticasone (FLONASE) 50 MCG/ACT nasal spray INSTILL 1 TO 2 SPRAYS INTO EACH NOSTRIL ONCE A DAY  . lisinopril-hydrochlorothiazide (PRINZIDE,ZESTORETIC) 20-25 MG per tablet take 1 tablet by mouth once daily  . medroxyPROGESTERone (PROVERA) 10 MG tablet Take 10 mg by mouth daily.  . metFORMIN (GLUCOPHAGE-XR) 500 MG 24 hr tablet TAKE 2 TABLETS BY MOUTH EVERY MORNING AND 1 TABLET EVERY EVENING  . mometasone-formoterol (DULERA) 100-5 MCG/ACT AERO Inhale 2 puffs into the lungs 2 (two) times daily.  . Multiple Vitamins-Minerals (MULTIVITAMIN WITH MINERALS) tablet Take 1 tablet by mouth daily.    Marland Kitchen omeprazole (PRILOSEC) 20 MG capsule Take 20 mg by mouth daily.    Marland Kitchen pyridOXINE (B-6) 50 MG tablet Take 50 mg by mouth daily.  . vitamin B-12 (CYANOCOBALAMIN) 1000 MCG tablet Take 1,000 mcg by mouth daily.    . vitamin C (ASCORBIC ACID) 500 MG tablet Take 500 mg by mouth daily.  Marland Kitchen zinc gluconate 50 MG tablet Take 50 mg by mouth daily.

## 2014-10-19 ENCOUNTER — Telehealth: Payer: Self-pay | Admitting: Pulmonary Disease

## 2014-10-19 NOTE — Telephone Encounter (Signed)
Thank you, Alida.

## 2014-10-19 NOTE — Telephone Encounter (Signed)
I have a form from [facility] for cpap supplies for Dr Gwenette Greet, Dr Gwenette Greet sees this patient for OSA. This cmn is being put in Dr Gwenette Greet folder to be signed, he is signing this weekend. Verdie Mosher

## 2014-10-19 NOTE — Telephone Encounter (Signed)
[name] is trying to receive a signed phyisican's order for CPAP machine and Supplies.  Order received did not have physician's signature.  They faxed a form for Dr. Joya Gaskins to sign.   Crystal - have you receive an order for Dr. Joya Gaskins to sign on this patient? Please advise.

## 2014-10-23 NOTE — Telephone Encounter (Signed)
Called and spoke to DME and informed rep of the status. Rep verbalized understanding and stated once they received the CMN nothing further will be needed.   Alida, was this returned to you yet? Thanks.

## 2014-10-23 NOTE — Telephone Encounter (Signed)
thiis cmn was signed and faxed, spoke with [facility] they have received the signed cmn, nothing further needed .Verdie Mosher

## 2014-12-04 ENCOUNTER — Other Ambulatory Visit: Payer: Self-pay | Admitting: Family Medicine

## 2014-12-05 MED ORDER — ALPRAZOLAM 0.5 MG PO TABS: 0.2500 mg | ORAL_TABLET | Freq: Three times a day (TID) | ORAL | Status: DC | PRN

## 2014-12-05 NOTE — Addendum Note (Signed)
Addended by: Minette Headland A on: 12/05/2014 07:52 AM   Modules accepted: Orders

## 2014-12-24 ENCOUNTER — Other Ambulatory Visit: Payer: Self-pay | Admitting: Family Medicine

## 2014-12-24 NOTE — Telephone Encounter (Signed)
Pt has an appt on 8/24 with you. Is this okay to refill

## 2014-12-24 NOTE — Telephone Encounter (Signed)
[facility] for #[phone] with 1 refill (labs due August)

## 2015-02-12 ENCOUNTER — Other Ambulatory Visit: Payer: Self-pay | Admitting: Family Medicine

## 2015-02-20 ENCOUNTER — Ambulatory Visit (INDEPENDENT_AMBULATORY_CARE_PROVIDER_SITE_OTHER): Payer: Managed Care, Other (non HMO) | Admitting: Family Medicine

## 2015-02-20 ENCOUNTER — Encounter: Payer: Self-pay | Admitting: Family Medicine

## 2015-02-20 VITALS — BP 114/62 | HR 72 | Ht 64.5 in | Wt 257.4 lb

## 2015-02-20 DIAGNOSIS — E782 Mixed hyperlipidemia: Secondary | ICD-10-CM | POA: Diagnosis not present

## 2015-02-20 DIAGNOSIS — Z6841 Body Mass Index (BMI) 40.0 and over, adult: Secondary | ICD-10-CM

## 2015-02-20 DIAGNOSIS — I10 Essential (primary) hypertension: Secondary | ICD-10-CM | POA: Diagnosis not present

## 2015-02-20 DIAGNOSIS — F411 Generalized anxiety disorder: Secondary | ICD-10-CM

## 2015-02-20 DIAGNOSIS — G4733 Obstructive sleep apnea (adult) (pediatric): Secondary | ICD-10-CM

## 2015-02-20 DIAGNOSIS — M1710 Unilateral primary osteoarthritis, unspecified knee: Secondary | ICD-10-CM

## 2015-02-20 DIAGNOSIS — Z23 Encounter for immunization: Secondary | ICD-10-CM

## 2015-02-20 DIAGNOSIS — F324 Major depressive disorder, single episode, in partial remission: Secondary | ICD-10-CM | POA: Diagnosis not present

## 2015-02-20 DIAGNOSIS — M179 Osteoarthritis of knee, unspecified: Secondary | ICD-10-CM

## 2015-02-20 DIAGNOSIS — E8881 Metabolic syndrome: Secondary | ICD-10-CM

## 2015-02-20 DIAGNOSIS — K219 Gastro-esophageal reflux disease without esophagitis: Secondary | ICD-10-CM

## 2015-02-20 DIAGNOSIS — F325 Major depressive disorder, single episode, in full remission: Secondary | ICD-10-CM

## 2015-02-20 DIAGNOSIS — Z5181 Encounter for therapeutic drug level monitoring: Secondary | ICD-10-CM

## 2015-02-20 DIAGNOSIS — J4540 Moderate persistent asthma, uncomplicated: Secondary | ICD-10-CM

## 2015-02-20 LAB — COMPREHENSIVE METABOLIC PANEL
ALK PHOS: 60 U/L (ref 33–130)
ALT: 28 U/L (ref 6–29)
AST: 30 U/L (ref 10–35)
Albumin: 4.7 g/dL (ref 3.6–5.1)
BUN: 19 mg/dL (ref 7–25)
CALCIUM: 10.2 mg/dL (ref 8.6–10.4)
CO2: 26 mmol/L (ref 20–31)
Chloride: 102 mmol/L (ref 98–110)
Creat: 1.02 mg/dL (ref 0.50–1.05)
GLUCOSE: 85 mg/dL (ref 65–99)
POTASSIUM: 4.5 mmol/L (ref 3.5–5.3)
Sodium: 139 mmol/L (ref 135–146)
Total Bilirubin: 0.5 mg/dL (ref 0.2–1.2)
Total Protein: 7.6 g/dL (ref 6.1–8.1)

## 2015-02-20 LAB — POCT GLYCOSYLATED HEMOGLOBIN (HGB A1C): HEMOGLOBIN A1C: 5.6

## 2015-02-20 MED ORDER — CITALOPRAM HYDROBROMIDE 40 MG PO TABS: ORAL_TABLET | ORAL | Status: DC

## 2015-02-20 MED ORDER — ATORVASTATIN CALCIUM 40 MG PO TABS: 40.0000 mg | ORAL_TABLET | Freq: Every day | ORAL | Status: DC

## 2015-02-20 MED ORDER — BUPROPION HCL ER (XL) 300 MG PO TB24: ORAL_TABLET | ORAL | Status: DC

## 2015-02-20 MED ORDER — LISINOPRIL-HYDROCHLOROTHIAZIDE 20-25 MG PO TABS: ORAL_TABLET | ORAL | Status: DC

## 2015-02-20 MED ORDER — METFORMIN HCL ER 500 MG PO TB24: ORAL_TABLET | ORAL | Status: DC

## 2015-02-20 NOTE — Progress Notes (Signed)
Chief Complaint  Patient presents with  . Med check    fasting med check.No other concerns, asked.   Depression/anxiety: Wellbutrin dose was increased in April 2015, and has maintained on the citalopram. Denies side effects. She continues to do well on her current regimen, using alprazolam about once a week for anxiety.  Her father will be moving into [place] soon, trying to sell his house, has a sick cat that likely will be put down soon. Anticipates she might need it a little more frequently in the coming weeks. Admits to emotional eating recently.  Hyperlipidemia follow-up: Patient is reportedly following a low-fat, low cholesterol diet. Compliant with medications and denies medication side effects.   Hypertension follow-up: Blood pressures aren't checked elsewhere. Denies dizziness (slight if working out hard, with high heart rate, and/or stands too quickly during something strenuous--very short-lived, not frequent). Denies headaches, chest pain, edema. Denies side effects of medications. She has a dry cough, which started prior to BP meds and seems to be related to reflux, as cough is worse if she doesn't take prilosec. Cough is infrequent/tolerable.   PCOS/insulin resistance: Compliant with metformin. Denies polydipsia, polyuria. Last eye appt was over 2 years ago.   GERD--takes Prilosec daily. Can skip it for a few days if she avoids certain foods, including dairy,but usually takes it. Cough recurs when she stops the prilosec. She takes it most days, 5/7 days of the week. Denies dysphagia.   OA--L knee. She is taking diclofenac once daily, and overall is doing well with this. She hasn't been using tylenol arthritis because she ran out.  She has ibuprofen at home, and takes 667m of ibuprofen in the morning sometimes, if needed (not daily). MRI of L knee reportedly didn't show any tears, just arthritis and cartilage damage. Still has ongoing knee pain, but plateaued overall, with occasional  flares.  Some stiffness with prolonged sitting, some positional issues. In the past she has had 1 cortisone injection, and full series of 3 of the Synvisc; doesn't report much benefit. Overall things are stable and manageable.   Asthma and allergies--well controlled on her current regimen. She sees pulmonary. Not needing rescue inhaler.  Her asthma is stable, without any change, last seen in April. She also sees pulmonary for OSA.  She is wearing her CPAP and doing well.  Obesity: She has gained 3.4 pounds since her last visit 6 months ago.  She continues to follow a regular exercise regimen. Still doing boot camp 4 days/week. +emotional eating recently. She has used [employer] in the past, but dropped that when stressors increased, too stressful to have to worry about inputting her food.  Health maintenance: She had a colonoscopy --can't recall date ('05 or '07, Dr. EOletta Lamas; she forgot to call and find out the date and when she is due next, as she planned to do after her last visit here 6 months ago. UTD with GYN.    Saw ortho for lump on right foot (that was noted at prior visit).  Treated with drainage and cortisone shots for a ganglion cyst twice but it has recurred. Not painful.  PMH, PSH, SH reviewed.  Outpatient Encounter Prescriptions as of 02/20/2015  Medication Sig Note  . ALPRAZolam (XANAX) 0.5 MG tablet Take 0.5-1 tablets (0.25-0.5 mg total) by mouth 3 (three) times daily as needed for anxiety. 02/20/2015: Uses about once a week  . atorvastatin (LIPITOR) 40 MG tablet Take 1 tablet (40 mg total) by mouth daily.   .Marland KitchenbuPROPion ([facility]  XL) 300 MG 24 hr tablet take 1 tablet by mouth once daily   . Calcium Carbonate-Vitamin D (CALCIUM + D) 600-200 MG-UNIT TABS Take 2 tablets by mouth daily.     . cetirizine (ZYRTEC) 10 MG tablet Take 10 mg by mouth daily.     . citalopram (CELEXA) 40 MG tablet take 1 tablet by mouth once daily   . diclofenac (VOLTAREN) 75 MG EC tablet take 1 tablet  by mouth twice a day 02/20/2015: Usually takes once daily  . fish oil-omega-3 fatty acids 1000 MG capsule Take 3 g by mouth daily.    . fluticasone (FLONASE) 50 MCG/ACT nasal spray INSTILL 1 TO 2 SPRAYS INTO EACH NOSTRIL ONCE A DAY   . lisinopril-hydrochlorothiazide (PRINZIDE,ZESTORETIC) 20-25 MG per tablet take 1 tablet by mouth once daily   . medroxyPROGESTERone (PROVERA) 10 MG tablet Take 10 mg by mouth daily.   . metFORMIN (GLUCOPHAGE-XR) 500 MG 24 hr tablet TAKE 2 TABLETS BY MOUTH EVERY MORNING AND 1 TABLET EVERY EVENING   . mometasone-formoterol (DULERA) 100-5 MCG/ACT AERO Inhale 2 puffs into the lungs 2 (two) times daily.   . Multiple Vitamins-Minerals (MULTIVITAMIN WITH MINERALS) tablet Take 1 tablet by mouth daily.     Marland Kitchen omeprazole (PRILOSEC) 20 MG capsule Take 20 mg by mouth daily.     . vitamin B-12 (CYANOCOBALAMIN) 1000 MCG tablet Take 1,000 mcg by mouth daily.     . vitamin C (ASCORBIC ACID) 500 MG tablet Take 500 mg by mouth daily.   . [DISCONTINUED] atorvastatin (LIPITOR) 40 MG tablet Take 1 tablet (40 mg total) by mouth daily.   . [DISCONTINUED] buPROPion (WELLBUTRIN XL) 300 MG 24 hr tablet take 1 tablet by mouth once daily   . [DISCONTINUED] citalopram (CELEXA) 40 MG tablet take 1 tablet by mouth once daily   . [DISCONTINUED] lisinopril-hydrochlorothiazide (PRINZIDE,ZESTORETIC) 20-25 MG per tablet take 1 tablet by mouth once daily   . [DISCONTINUED] metFORMIN (GLUCOPHAGE-XR) 500 MG 24 hr tablet TAKE 2 TABLETS BY MOUTH EVERY MORNING AND 1 TABLET EVERY EVENING   . [DISCONTINUED] pyridOXINE (B-6) 50 MG tablet Take 50 mg by mouth daily.   . cyclobenzaprine (FLEXERIL) 5 MG tablet as needed. 02/14/2014: Takes prn back pain (last rx'd by Dr. Leo Grosser; also helps with endometriosis pain)  . [DISCONTINUED] acetaminophen (TYLENOL) 650 MG CR tablet Take 1,300 mg by mouth as needed.    . [DISCONTINUED] zinc gluconate 50 MG tablet Take 50 mg by mouth daily.      No facility-administered  encounter medications on file as of 02/20/2015.   No Known Allergies  ROS: no fevers, chills, URI symptoms, headaches, dizziness, chest pain, shortness of breath, nausea, vomiting, bowel changes, GU complaints.  Knee pain as per HPI, weight gain.  RUQ pain if too much salad Numbness between 1st and 2nd toes at webspace, and slight numbness on the outside of the 5th toes as well, L>R.  She does have some LBP and at her hips, sometimes to the groin. No weakness or giving way of the legs; no bowel or bladder issues. Subungual hemorrhage of great toenails--growing out.   PHYSICAL EXAM:  BP 114/62 mmHg  Pulse 72  Ht 5' 4.5" (1.638 m)  Wt 257 lb 6.4 oz (116.756 kg)  BMI 43.52 kg/m2  Well developed, pleasant, morbidly obese female in no distress HEENT: PERRL, EOMI, conjunctiva clear.  OP clear Neck: no lymphadenopathy or thyromegaly Heart: regular rate and rhythm Lungs: clear bilaterally, no wheezes Abdomen: obese, soft, nontender, no  mass, no organomegaly  Back: no spinal or CVA tendeness Extremities: normal pulses, no edema.  Left distal great toenail with very slight residual subungual hemorrhage and thickening of the nail. Skin: no suspicious lesions Neuro: alert and oriented, normal gait, cranial nerves Psych: normal mood, affect, hygiene and grooming  ASSESSMENT/PLAN:  Insulin resistance - prediabetes. doing well on Metformin.  Weight loss encouraged - Plan: HgB A1c, Comprehensive metabolic panel  Essential hypertension - controlled - Plan: lisinopril-hydrochlorothiazide (PRINZIDE,ZESTORETIC) 20-25 MG per tablet  Mixed hyperlipidemia - Plan: Comprehensive metabolic panel, atorvastatin (LIPITOR) 40 MG tablet  Osteoarthritis of knee, unspecified laterality, unspecified osteoarthritis type - Weight loss encouraged, continue exercise, daily diclofenac. stop ibuprofen--use tylenol arthritis instead prn.  Gastroesophageal reflux disease, esophagitis presence not specified -  controlled with prilosec OTC  Anxiety state - controlled.  will call when needs alprazolam refilled - Plan: buPROPion (WELLBUTRIN XL) 300 MG 24 hr tablet, citalopram (CELEXA) 40 MG tablet  Morbid obesity with body mass index of 45.0-49.9 in adult - discussed portion control, stress reduction, food journal. discussed Qsymia, to consider. not interested in surgical eval  Moderate persistent asthma in adult without complication - controlled; f/b pulm  OSA (obstructive sleep apnea) - compliant with CPAP  Medication monitoring encounter - Plan: Comprehensive metabolic panel  Anxiety state - Plan: buPROPion (WELLBUTRIN XL) 300 MG 24 hr tablet, citalopram (CELEXA) 40 MG tablet  Depression, major, in remission - Plan: buPROPion (WELLBUTRIN XL) 300 MG 24 hr tablet, citalopram (CELEXA) 40 MG tablet  Essential hypertension - Plan: lisinopril-hydrochlorothiazide (PRINZIDE,ZESTORETIC) 20-25 MG per tablet  Insulin resistance syndrome - continue metformin - Plan: metFORMIN (GLUCOPHAGE-XR) 500 MG 24 hr tablet  Consider qsymia.  Discussed phentermine and qsymia, risks/side effects in detail. Let us know if interested and would like to try.  Encouraged journal/My[employer]itnessPal to help keep her accountable with her eating/behavior.  c-met Last lipids were at goal. TSH is UTD, done 6 mos ago  Flu shot today

## 2015-02-20 NOTE — Patient Instructions (Addendum)
Please check with Dr. Oletta Lamas to see when your last colonoscopy was (and if they could send me a copy, that would be great!)  Please schedule routine eye exam.  Consider Qsymia--this is the weight-loss medication we discussed.  Let us know if you are interested in starting this.

## 2015-02-20 NOTE — Progress Notes (Signed)
Chief Complaint  Patient presents with  . Med check    fasting med check.No other concerns, asked.   Depression/anxiety: Wellbutrin dose was increased in April 2015, and has maintained on the citalopram. Denies side effects. She continues to do well on her current regimen, using alprazolam about once a week for anxiety.  Her father will be moving into [place] soon, trying to sell his house, has a sick cat. Anticipates she might need it a little more frequently in the coming weeks. Admits to emotional eating.  Hyperlipidemia follow-up: Patient is reportedly following a low-fat, low cholesterol diet. Compliant with medications and denies medication side effects.   Hypertension follow-up: Blood pressures aren't checked elsewhere. Denies dizziness (slight if working out hard, with high heart rate, and/or stands too quickly during something strenuous--very short-lived, not frequent). Denies headaches, chest pain, edema. Denies side effects of medications. She has a dry cough, which started prior to BP meds and seems to be related to reflux, as cough is worse if she doesn't take prilosec. Cough is infrequent/tolerable.   PCOS/insulin resistance: Compliant with metformin. Denies polydipsia, polyuria. Last eye appt was over 2 years ago.   GERD--takes Prilosec daily. Can skip it for a few days if she avoids certain foods, including dairy,but usually takes it. Cough recurs when she stops the prilosec. She takes it most days, 5/7 days of the week. Denies dysphagia.   OA--L knee. She is taking diclofenac once daily, and overall is doing well with this. She hasn't been using tylenol arthritis because she ran out.  She has ibuprofen at home, and takes 647m of ibuprofen in the morning sometimes, if needed (not daily). MRI of L knee reportedly didn't show any tears, just arthritis and cartilage damage. Still has ongoing knee pain, but plateaued overall, with occasional flares.  Some stiffness with prolonged  sitting, some positional issues. Prior to her last visit 6 months ago, she got some PT (1 session), and had lost weight. She had 1 cortisone injection, and full series of 3 of the Synvisc; doesn't report much benefit. Overall things are stable and manageable.   Asthma and allergies--well controlled on her current regimen. She sees pulmonary. Not needing rescue inhaler.  Her asthma is stable, without any change, last seen in April. She also sees pulmonary for OSA.  She is wearing her CPAP and doing well.  Obesity: She has gained 3.4 pounds since her last visit 6 months ago.  She continues to follow a regular exercise regimen. Still doing boot camp 4 days/week. +emotkional eating recently. She has used [employer] in the past, but dropped that when stressors increased.  Health maintenance: She had a colonoscopy --can't recall date ('05 or '07, Dr. EOletta Lamas; she forgot to call and find out, as she planned to do after her last visit here 6 months ago. UTD with GYN.     Saw ortho for lump on right foot.  Treated with drainage and cortisone shots twice but it has recurred. Not painful.     ROS  RUQ pain if too much salad Numbness between 1st and 2nd toes at webspace, and slight numbness on the outside of the 5th toes as well, L>R.  She does have some LBP and at her hips, sometimes to the groin. No weakness or giving way of the legs; no bowel or bladder issues. Subungual hemorrhage of great toenails--growing out.   PHYSICAL EXAM:  BP 114/62 mmHg  Pulse 72  Ht 5' 4.5" (1.638 m)  Wt 257 lb  6.4 oz (116.756 kg)  BMI 43.52 kg/m2   Left distal great toenail with very slight residual subungual hemorrhage and thickening of the nail.  Consider qsymia.  Discussed phentermine and qsymia, risks/side effects in detail.  c-met Last lipids were at goal. TSH is UTD, done 6 mos ago  Flu shot today

## 2015-02-21 DIAGNOSIS — Z23 Encounter for immunization: Secondary | ICD-10-CM | POA: Diagnosis not present

## 2015-02-21 NOTE — Addendum Note (Signed)
Addended by: Carolee Rota F on: 02/21/2015 07:53 AM   Modules accepted: Orders

## 2015-04-01 ENCOUNTER — Other Ambulatory Visit: Payer: Self-pay

## 2015-04-01 DIAGNOSIS — Z1231 Encounter for screening mammogram for malignant neoplasm of breast: Secondary | ICD-10-CM

## 2015-04-22 ENCOUNTER — Encounter: Payer: Self-pay | Admitting: *Deleted

## 2015-04-22 LAB — HM DEXA SCAN: HM Dexa Scan: NORMAL

## 2015-05-09 ENCOUNTER — Encounter: Payer: Self-pay | Admitting: Family Medicine

## 2015-05-09 ENCOUNTER — Ambulatory Visit (INDEPENDENT_AMBULATORY_CARE_PROVIDER_SITE_OTHER): Payer: Managed Care, Other (non HMO) | Admitting: Family Medicine

## 2015-05-09 VITALS — BP 110/70 | HR 72 | Ht 64.5 in | Wt 261.0 lb

## 2015-05-09 DIAGNOSIS — M545 Low back pain: Secondary | ICD-10-CM

## 2015-05-09 LAB — POCT URINALYSIS DIPSTICK
BILIRUBIN UA: NEGATIVE
GLUCOSE UA: NEGATIVE
Ketones, UA: NEGATIVE
NITRITE UA: NEGATIVE
Protein, UA: NEGATIVE
RBC UA: NEGATIVE
Spec Grav, UA: 1.015
Urobilinogen, UA: NEGATIVE
pH, UA: 5.5

## 2015-05-09 MED ORDER — KETOROLAC TROMETHAMINE 60 MG/2ML IM SOLN
60.0000 mg | Freq: Once | INTRAMUSCULAR | Status: AC
  Administered 2015-05-09: 60 mg via INTRAMUSCULAR

## 2015-05-09 NOTE — Progress Notes (Signed)
Chief Complaint  Patient presents with  . Back Pain    pulling a rug into her backseat of her car and pulled lower back. Pain is worst when she goes to stand. Tender when walking, sitting. Sciatic pain was earlier this week and this has made that worse as well.    Last night she was pulling a heavy rolled up rug into her back seat of her car.  No pain while doing the activity, but immediately afterwards she realized she had low back pain. Pain was in the center, and now is also on the right side. It is not radiating down the leg. Pain is sharp centrally.  Today she took 2 tylenol arthritis. Sitting in office, pain is 4/10.  Worse with standing, up to 8/10. 4/10 with walking.  Earlier this week she had some sciatica pain, which radiated out to both hips (not down the leg).  It had gotten better, but starting to flare on the right side again.  She takes the voltaren once daily, in the evening.  Earlier in the week she used it a second time that day (uses BID just prn).  She took her voltaren last night, as well as a flexeril last night before bed.  She thinks that helped.  She slept well.  She has never taken flexeril during the day to know if it makes her drowsy.  Denies fever, chills, numbness, tingling, weakness, bowel/bladder incontinence or other concerns  PMH, PSH, SH reviewed  Outpatient Encounter Prescriptions as of 05/09/2015  Medication Sig Note  . acetaminophen (TYLENOL) 650 MG CR tablet Take 1,300 mg by mouth every 8 (eight) hours as needed for pain.   Marland Kitchen ALPRAZolam (XANAX) 0.5 MG tablet Take 0.5-1 tablets (0.25-0.5 mg total) by mouth 3 (three) times daily as needed for anxiety. 02/20/2015: Uses about once a week  . atorvastatin (LIPITOR) 40 MG tablet Take 1 tablet (40 mg total) by mouth daily.   Marland Kitchen buPROPion (WELLBUTRIN XL) 300 MG 24 hr tablet take 1 tablet by mouth once daily   . Calcium Carbonate-Vitamin D (CALCIUM + D) 600-200 MG-UNIT TABS Take 2 tablets by mouth daily.     .  cetirizine (ZYRTEC) 10 MG tablet Take 10 mg by mouth daily.     . citalopram (CELEXA) 40 MG tablet take 1 tablet by mouth once daily   . cyclobenzaprine (FLEXERIL) 5 MG tablet as needed. 02/14/2014: Takes prn back pain (last rx'd by Dr. Leo Grosser; also helps with endometriosis pain)  . fish oil-omega-3 fatty acids 1000 MG capsule Take 3 g by mouth daily.    . fluticasone (FLONASE) 50 MCG/ACT nasal spray INSTILL 1 TO 2 SPRAYS INTO EACH NOSTRIL ONCE A DAY   . lisinopril-hydrochlorothiazide (PRINZIDE,ZESTORETIC) 20-25 MG per tablet take 1 tablet by mouth once daily   . medroxyPROGESTERone (PROVERA) 10 MG tablet Take 10 mg by mouth daily.   . metFORMIN (GLUCOPHAGE-XR) 500 MG 24 hr tablet TAKE 2 TABLETS BY MOUTH EVERY MORNING AND 1 TABLET EVERY EVENING   . mometasone-formoterol (DULERA) 100-5 MCG/ACT AERO Inhale 2 puffs into the lungs 2 (two) times daily.   . Multiple Vitamins-Minerals (MULTIVITAMIN WITH MINERALS) tablet Take 1 tablet by mouth daily.     Marland Kitchen omeprazole (PRILOSEC) 20 MG capsule Take 20 mg by mouth daily.     . vitamin B-12 (CYANOCOBALAMIN) 1000 MCG tablet Take 1,000 mcg by mouth daily.     . vitamin C (ASCORBIC ACID) 500 MG tablet Take 500 mg by mouth daily.   Marland Kitchen  diclofenac (VOLTAREN) 75 MG EC tablet take 1 tablet by mouth twice a day (Patient not taking: Reported on 05/09/2015) 02/20/2015: Usually takes once daily  . [EXPIRED] ketorolac (TORADOL) injection 60 mg     No facility-administered encounter medications on file as of 05/09/2015.   No Known Allergies  ROS:  No fever, chills, URI symptoms, chest pain, shortness of breath, GI or GU complaints. No numbness, tingling, weakness, bleeding, bruising, rash or other complaints except as noted in HPI. +LBP  PHYSICAL EXAM BP 110/70 mmHg  Pulse 72  Ht 5' 4.5" (1.638 m)  Wt 261 lb (118.389 kg)  BMI 44.12 kg/m2  Well developed, pleasant female, who appears comfortable at rest in the office Back: no CVA tenderness. Mild tenderness at L2-3  with slight tenderness in paraspinous muscles on the right at this level Normal strength, sensation, DTR's; negative SLR Skin: normal turgor, no rash Psych: normal mood, affect, hygiene and grooming Heart: regular rate and rhythm Lungs: clear bilaterally Abdomen: soft, nonteder, no mass  ASSESSMENT/PLAN:  Low back pain, unspecified back pain laterality, with sciatica presence unspecified - back strain; no radiculopathy, some muscle spasm - Plan: POCT Urinalysis Dipstick, ketorolac (TORADOL) injection 60 mg  Heat, stretches, NSAID's, muscle relaxants. Risks and side effects of new meds were reviewed   You were given a toradol injection today.  Don't take any other anti-inflammatory medications for at least 6 hours (voltaren or advil/aleve/ibuprofen/BC or Goody powder). You may continue to take tylenol as needed.  You may continue the voltaren twice daily with food. You can use up to 12m of flexeril at bedtime.  During the day, you can try the 532m but if it is too sedating, you can either cut it in half, or call [place] a less sedating muscle relaxant for daytime use.  Be careful when first using the flexeril during the day (do not drive)   Try heating pad.  Avoid lifting, twisting, bending (bend from the knees, not the waist). Avoid jumping, running.  Okay to walk and use exercise bike if these do not cause pain. Listen to your body!  Return if worsening pain, numbness, tingling, weakness, or other symptoms occur. Especially if any fever, bladder or bowel incontinence.

## 2015-05-09 NOTE — Patient Instructions (Signed)
  You were given a toradol injection today.  Don't take any other anti-inflammatory medications for at least 6 hours (voltaren or advil/aleve/ibuprofen/BC or Goody powder). You may continue to take tylenol as needed.  You may continue the voltaren twice daily with food. You can use up to 64m of flexeril at bedtime.  During the day, you can try the 569m but if it is too sedating, you can either cut it in half, or call [place] a less sedating muscle relaxant for daytime use.  Be careful when first using the flexeril during the day (do not drive)   Try heating pad.  Avoid lifting, twisting, bending (bend from the knees, not the waist). Avoid jumping, running.  Okay to walk and use exercise bike if these do not cause pain. Listen to your body!  Return if worsening pain, numbness, tingling, weakness, or other symptoms occur. Especially if any fever, bladder or bowel incontinence.

## 2015-05-10 ENCOUNTER — Ambulatory Visit (INDEPENDENT_AMBULATORY_CARE_PROVIDER_SITE_OTHER): Payer: Managed Care, Other (non HMO) | Admitting: Internal Medicine

## 2015-05-10 ENCOUNTER — Telehealth: Payer: Self-pay | Admitting: Internal Medicine

## 2015-05-10 ENCOUNTER — Encounter: Payer: Self-pay | Admitting: Internal Medicine

## 2015-05-10 VITALS — BP 152/70 | HR 86 | Ht 64.0 in | Wt 262.0 lb

## 2015-05-10 DIAGNOSIS — J4540 Moderate persistent asthma, uncomplicated: Secondary | ICD-10-CM

## 2015-05-10 LAB — NITRIC OXIDE: NITRIC OXIDE: 32

## 2015-05-10 NOTE — Telephone Encounter (Signed)
[facility] [employer] and spoke to the pharmacist. She stated Losartan-HCTZ does not come in 34m/12.5mg the lowest is 568m12.5mg.   MR please advise. Thanks.    Moderate persistent asthma in adult without complication 49712.524V12.92   Well controlled stable disease despite exhaled nitric oxide just in grey zone Glad uptodate with flu shot  Plan - continue dulera 2 puff twice daily = use alb as needed - change lisinopril-hctz to losartan-hctz 2554m2.5 daily   Followup 6 months or sooner if needed - ACQ and FeNO at followup

## 2015-05-10 NOTE — Telephone Encounter (Signed)
[facility] do if she is willing to do losartan 25 mg without the hydrochlorothiazide. If so he can just prescribe losartan without hydrochlorothiazide. It is possible that she might want hydrochlorothiazide because of pedal edema and if so then she can have a separate prescription

## 2015-05-10 NOTE — Progress Notes (Signed)
Subjective:     Patient ID: Susan Gould, female   DOB: [birth year], 49 y.o.   MRN: [id number]  HPI  OV 05/10/2015  Chief Complaint  Patient presents with  . Follow-up    Pt states her breathing is doing well. Pt c/o mild prod cough. Pt states overall she does not have any complaints.    49 year old obese  patient with asthma . She used tpo be followed by Dr Joya Gaskins for asthma.  Now transfer of care to Dr Chase Caller  - Reports life loing hx of asthma. Moved from [place] to [place] in the 80s. Last asthma exac > 25 years ago. Does not remoember last prednisone. This is routine 6 month fu. Last visit April 2016. Since then compliant as always with dulera 2 puff bid. Never used alb rescue. ACQ score is 0 out of 5 showing grood control Exhaled NO is 32 and above 25 and so slightly high but in grey zone.   - Specifically she does not wake up at night because of asthma. When she wakes up she is asymptomatic. She is not limited in her activities in the daytime because of asthma. She does not have any shortness of breath because of asthma. No wheezing because of asthma. No albuterol rescue use. Review of medications shows she is on ACE inhibitor but she does not have a cough  Asthma Control Panel - normal spiro feb 2015 -IgE - none done - cxr 2001 - clear - positive allergy testing by history greater than 25 years ago 05/10/2015   Current Med Regimen dulera  ACQ 5 point- 1 week. wtd avg score. <1.0 is good control 0.75-1.25 is grey zone. >1.25 poor control. Delta 0.5 is clinically meaningful ZERO  ACQ 7 point - 1 week. wtd avg score. <1.0 is good control 0.75-1.25 is grey zone. >1.25 poor control. Delta 0.5 is clinically meaningful x  ACT - a [employer] test - 4 week. Total score. Max is 25, Lower score is worse.  <19 = poor control x  FeNO ppB 32ppb  FeV1  x  Planned intervention  for visit contininue dulera, change ace to arb     Current outpatient prescriptions:  .  acetaminophen (TYLENOL) 650 MG CR  tablet, Take 1,300 mg by mouth every 8 (eight) hours as needed for pain., Disp: , Rfl:  .  ALPRAZolam (XANAX) 0.5 MG tablet, Take 0.5-1 tablets (0.25-0.5 mg total) by mouth 3 (three) times daily as needed for anxiety., Disp: 20 tablet, Rfl: 0 .  atorvastatin (LIPITOR) 40 MG tablet, Take 1 tablet (40 mg total) by mouth daily., Disp: 30 tablet, Rfl: 5 .  buPROPion (WELLBUTRIN XL) 300 MG 24 hr tablet, take 1 tablet by mouth once daily, Disp: 30 tablet, Rfl: 5 .  Calcium Carbonate-Vitamin D (CALCIUM + D) 600-200 MG-UNIT TABS, Take 2 tablets by mouth daily.  , Disp: , Rfl:  .  cetirizine (ZYRTEC) 10 MG tablet, Take 10 mg by mouth daily.  , Disp: , Rfl:  .  citalopram (CELEXA) 40 MG tablet, take 1 tablet by mouth once daily, Disp: 30 tablet, Rfl: 5 .  cyclobenzaprine (FLEXERIL) 5 MG tablet, as needed., Disp: , Rfl:  .  diclofenac (VOLTAREN) 75 MG EC tablet, take 1 tablet by mouth twice a day, Disp: 60 tablet, Rfl: 1 .  fish oil-omega-3 fatty acids 1000 MG capsule, Take 3 g by mouth daily. , Disp: , Rfl:  .  fluticasone (FLONASE) 50 MCG/ACT nasal spray, INSTILL 1 TO 2  SPRAYS INTO EACH NOSTRIL ONCE A DAY, Disp: 16 g, Rfl: 11 .  lisinopril-hydrochlorothiazide (PRINZIDE,ZESTORETIC) 20-25 MG per tablet, take 1 tablet by mouth once daily, Disp: 30 tablet, Rfl: 5 .  medroxyPROGESTERone (PROVERA) 10 MG tablet, Take 10 mg by mouth daily., Disp: , Rfl:  .  metFORMIN (GLUCOPHAGE-XR) 500 MG 24 hr tablet, TAKE 2 TABLETS BY MOUTH EVERY MORNING AND 1 TABLET EVERY EVENING, Disp: 90 tablet, Rfl: 5 .  mometasone-formoterol (DULERA) 100-5 MCG/ACT AERO, Inhale 2 puffs into the lungs 2 (two) times daily., Disp: 1 Inhaler, Rfl: 6 .  Multiple Vitamins-Minerals (MULTIVITAMIN WITH MINERALS) tablet, Take 1 tablet by mouth daily.  , Disp: , Rfl:  .  omeprazole (PRILOSEC) 20 MG capsule, Take 20 mg by mouth daily.  , Disp: , Rfl:  .  vitamin B-12 (CYANOCOBALAMIN) 1000 MCG tablet, Take 1,000 mcg by mouth daily.  , Disp: , Rfl:  .   vitamin C (ASCORBIC ACID) 500 MG tablet, Take 500 mg by mouth daily., Disp: , Rfl:   Immunization History  Administered Date(s) Administered  . Influenza Split 04/17/2012  . Influenza Whole 03/29/2009, 03/17/2010, 04/23/2011  . Influenza,inj,Quad PF,36+ Mos 03/07/2013, 02/14/2014, 02/21/2015  . Pneumococcal Polysaccharide-23 03/07/2013  . Td 08/14/2004  . Tdap 07/29/2011    No Known Allergies   reports that she has never smoked. She has never used smokeless tobacco.    has a past medical history of Asthma; Obesity; Hypertension; GERD (gastroesophageal reflux disease); Allergic rhinitis, cause unspecified; Hyperlipidemia; OSA (obstructive sleep apnea); Depression (2006); Anxiety (2006); Colon polyps (2007); Hemorrhoid; Endometriosis; Insulin resistance; Family history of breast cancer in female; and Medial meniscus tear.    has past surgical history that includes Cholecystectomy (1986); Knee surgery ('95 and '97); Finger ganglion cyst excision (2003); LASIK (2000); laparoscopy ('98, '01); and tibial tuberoplasty (90's).   Review of Systems Per hpi    Objective:   Physical Exam  Constitutional: She is oriented to person, place, and time. She appears well-developed and well-nourished. No distress.  Body mass index is 44.95 kg/(m^2). '  HENT:  Head: Normocephalic and atraumatic.  Right Ear: External ear normal.  Left Ear: External ear normal.  Mouth/Throat: Oropharynx is clear and moist. No oropharyngeal exudate.  mallampatti class 3  Eyes: Conjunctivae and EOM are normal. Pupils are equal, round, and reactive to light. Right eye exhibits no discharge. Left eye exhibits no discharge. No scleral icterus.  Neck: Normal range of motion. Neck supple. No JVD present. No tracheal deviation present. No thyromegaly present.  Cardiovascular: Normal rate, regular rhythm, normal heart sounds and intact distal pulses.  Exam reveals no gallop and no friction rub.   No murmur  heard. Pulmonary/Chest: Effort normal and breath sounds normal. No respiratory distress. She has no wheezes. She has no rales. She exhibits no tenderness.  Abdominal: Soft. Bowel sounds are normal. She exhibits no distension and no mass. There is no tenderness. There is no rebound and no guarding.  Musculoskeletal: Normal range of motion. She exhibits no edema or tenderness.  Lymphadenopathy:    She has no cervical adenopathy.  Neurological: She is alert and oriented to person, place, and time. She has normal reflexes. No cranial nerve deficit. She exhibits normal muscle tone. Coordination normal.  Skin: Skin is warm and dry. No rash noted. She is not diaphoretic. No erythema. No pallor.  Psychiatric: She has a normal mood and affect. Her behavior is normal. Judgment and thought content normal.  Vitals reviewed.  Filed Vitals:  05/10/15 0858  BP: 152/70  Pulse: 86  Height: 5' 4"  (1.626 m)  Weight: 262 lb (118.842 kg)  SpO2: 98%         Assessment:       ICD-9-CM ICD-10-CM   1. Moderate persistent asthma in adult without complication 553.74 M27.07         Plan:       Well controlled stable disease despite exhaled nitric oxide just in grey zone Glad uptodate with flu shot  Plan  - continue dulera 2 puff twice daily = use alb as needed - change lisinopril-hctz  to losartan-hctz 102m/12.5 daily   Followup  6 months or sooner if needed  - ACQ and FeNO at followup  Dr. MBrand Males M.D., FMclaren Greater LansingC.P Pulmonary and Critical Care Medicine Staff Physician [facility] Care Pager: [phone] If no answer or between  15:00h - 7:00h: call [phone]  05/10/2015 9:27 AM

## 2015-05-10 NOTE — Addendum Note (Signed)
Addended by: Maurice March on: 05/10/2015 12:28 PM   Modules accepted: Orders

## 2015-05-10 NOTE — Patient Instructions (Signed)
ICD-9-CM ICD-10-CM   1. Moderate persistent asthma in adult without complication 702.30 N72.09     Well controlled stable disease despite exhaled nitric oxide just in grey zone Glad uptodate with flu shot  Plan  - continue dulera 2 puff twice daily = use alb as needed - change lisinopril-hctz  to losartan-hctz 69m/12.5 daily   Followup  6 months or sooner if needed  - ACQ and FeNO at followup

## 2015-05-10 NOTE — Telephone Encounter (Signed)
lmtcb X1 for pt  

## 2015-05-13 ENCOUNTER — Ambulatory Visit
Admission: RE | Admit: 2015-05-13 | Discharge: 2015-05-13 | Disposition: A | Discharge status: Home / Self Care | Payer: Managed Care, Other (non HMO) | Source: Ambulatory Visit

## 2015-05-13 DIAGNOSIS — Z1231 Encounter for screening mammogram for malignant neoplasm of breast: Secondary | ICD-10-CM

## 2015-05-13 NOTE — Telephone Encounter (Signed)
ATC pt at home # listed--NA and no VM WCB

## 2015-05-14 NOTE — Telephone Encounter (Signed)
ATC pt home # again and no VM. WCB

## 2015-05-16 NOTE — Telephone Encounter (Signed)
ATC pt , no answer  wcb

## 2015-05-17 ENCOUNTER — Telehealth: Payer: Self-pay | Admitting: Internal Medicine

## 2015-05-17 MED ORDER — LOSARTAN POTASSIUM 25 MG PO TABS: 25.0000 mg | ORAL_TABLET | Freq: Every day | ORAL | Status: DC

## 2015-05-17 NOTE — Telephone Encounter (Signed)
atc X4, no answer.   Letter sent to pt to contact office.

## 2015-05-17 NOTE — Telephone Encounter (Signed)
I opened under wrong doc.  Closing.

## 2015-05-17 NOTE — Telephone Encounter (Signed)
Per 05/10/15 phone note: Brand Males, MD at 05/10/2015 2:14 PM     Status: Signed       Expand All Collapse All   [facility] do if she is willing to do losartan 25 mg without the hydrochlorothiazide. If so he can just prescribe losartan without hydrochlorothiazide. It is possible that she might want hydrochlorothiazide because of pedal edema and if so then she can have a separate prescription            Maurice March, RN at 05/10/2015 1:32 PM     Status: Signed       Expand All Collapse All   [facility] and spoke to the pharmacist. She stated Losartan-HCTZ does not come in 8m/12.5mg the lowest is 558m12.5mg.   MR please advise. Thanks.        Called spoke with pt. She is aware of the above. She wanted to see how she did just with the losartan 25 mg w/o HCTZ. i have called this in. FYI to MR.

## 2015-05-21 ENCOUNTER — Other Ambulatory Visit: Payer: Self-pay | Admitting: Family Medicine

## 2015-05-21 NOTE — Telephone Encounter (Signed)
Dr.Knapp is this okay to refill

## 2015-06-05 NOTE — Telephone Encounter (Signed)
Ok thanks 

## 2015-06-10 ENCOUNTER — Encounter: Payer: Self-pay | Admitting: Family Medicine

## 2015-06-10 ENCOUNTER — Ambulatory Visit (INDEPENDENT_AMBULATORY_CARE_PROVIDER_SITE_OTHER): Payer: Managed Care, Other (non HMO) | Admitting: Family Medicine

## 2015-06-10 VITALS — BP 122/72 | HR 80 | Temp 98.0°F | Ht 64.5 in | Wt 257.8 lb

## 2015-06-10 DIAGNOSIS — I10 Essential (primary) hypertension: Secondary | ICD-10-CM | POA: Diagnosis not present

## 2015-06-10 DIAGNOSIS — J309 Allergic rhinitis, unspecified: Secondary | ICD-10-CM

## 2015-06-10 DIAGNOSIS — H9203 Otalgia, bilateral: Secondary | ICD-10-CM | POA: Diagnosis not present

## 2015-06-10 NOTE — Patient Instructions (Addendum)
  Stop using Q-tips.  If you have decreased hearing in the right ear, it is from wax, and try getting some Debrox drops. If not improving, return here for an ear lavage. Use some mineral oil or olive oil (just a drop) to help with dry, itchy ears. If itching or pain in the canal gets worse, call us for antibiotic drops (that will also have a steroid in it to help with the itching).  Continue to treat your allergies with the flonase and zyrtec.  The ear discomfort on the left may also partly be related to eustachian tube dysfunction from the congestion.  Your blood pressure was very good today, even though the new medication isn't as strong as the previous one.  Periodically check your blood pressure elsewhere.

## 2015-06-10 NOTE — Progress Notes (Signed)
Chief Complaint  Patient presents with  . Ear Pain    right was worse than left, she used leftover cipro ear drops for the right and got somewhat better. Now left is worse than right.     10 days ago she started with itchy ears.  Then the right ear started hurting, intermittently.  She used leftover cipro drops x 3-4 doses, treating the right ear, and it is now feeling better, but the left ear is now starting to hurt more.  She has been scratching ears with her finger, and even a knitting needle (just to the outside of the ear).   Allergies/congestion is per baseline. She continues to have itchy ears.   At one point she used lidex cream (leftover from a scalp rash) to help with the itching.   Lisinopril HCT was changed to losartan by her pulmonologist.  Hasn't checked BP elsewhere.  Denies any cough, headaches, dizziness.   PMH, PSH, SH reviewed.  Outpatient Encounter Prescriptions as of 06/10/2015  Medication Sig Note  . atorvastatin (LIPITOR) 40 MG tablet Take 1 tablet (40 mg total) by mouth daily.   Marland Kitchen buPROPion (WELLBUTRIN XL) 300 MG 24 hr tablet take 1 tablet by mouth once daily   . Calcium Carbonate-Vitamin D (CALCIUM + D) 600-200 MG-UNIT TABS Take 2 tablets by mouth daily.     . cetirizine (ZYRTEC) 10 MG tablet Take 10 mg by mouth daily.     . citalopram (CELEXA) 40 MG tablet take 1 tablet by mouth once daily   . diclofenac (VOLTAREN) 75 MG EC tablet take 1 tablet by mouth twice a day (Patient taking differently: daily)   . fish oil-omega-3 fatty acids 1000 MG capsule Take 3 g by mouth daily.    . fluticasone (FLONASE) 50 MCG/ACT nasal spray INSTILL 1 TO 2 SPRAYS INTO EACH NOSTRIL ONCE A DAY   . losartan (COZAAR) 25 MG tablet Take 1 tablet (25 mg total) by mouth daily.   . medroxyPROGESTERone (PROVERA) 10 MG tablet Take 10 mg by mouth daily.   . metFORMIN (GLUCOPHAGE-XR) 500 MG 24 hr tablet TAKE 2 TABLETS BY MOUTH EVERY MORNING AND 1 TABLET EVERY EVENING   . mometasone-formoterol  (DULERA) 100-5 MCG/ACT AERO Inhale 2 puffs into the lungs 2 (two) times daily.   . Multiple Vitamins-Minerals (MULTIVITAMIN WITH MINERALS) tablet Take 1 tablet by mouth daily.     Marland Kitchen omeprazole (PRILOSEC) 20 MG capsule Take 20 mg by mouth daily.     . vitamin B-12 (CYANOCOBALAMIN) 1000 MCG tablet Take 1,000 mcg by mouth daily.     . vitamin C (ASCORBIC ACID) 500 MG tablet Take 500 mg by mouth daily.   Marland Kitchen acetaminophen (TYLENOL) 650 MG CR tablet Take 1,300 mg by mouth every 8 (eight) hours as needed for pain.   Marland Kitchen ALPRAZolam (XANAX) 0.5 MG tablet Take 0.5-1 tablets (0.25-0.5 mg total) by mouth 3 (three) times daily as needed for anxiety. (Patient not taking: Reported on 06/10/2015) 02/20/2015: Uses about once a week  . cyclobenzaprine (FLEXERIL) 5 MG tablet as needed. 02/14/2014: Takes prn back pain (last rx'd by Dr. Leo Grosser; also helps with endometriosis pain)  . HYDROcodone-acetaminophen (NORCO) 10-325 MG tablet TAKE 1/2 -1 TABLET BY MOUTH EVERY 8-12 HOURS AS NEEDED FOR PAIN 06/10/2015: Received from: External Pharmacy  . [DISCONTINUED] lisinopril-hydrochlorothiazide (PRINZIDE,ZESTORETIC) 20-25 MG per tablet take 1 tablet by mouth once daily    No facility-administered encounter medications on file as of 06/10/2015.   ROS: no fever, chills, sinus  pain, discolored mucus; allergies per baseline.  Breathing is stable, no dyspnea/wheezing.  No decreased hearing. No GI complaints, bleeding, bruising or other problems.  PHYSICAL EXAM: BP 122/72 mmHg  Pulse 80  Temp(Src) 98 F (36.7 C) (Tympanic)  Ht 5' 4.5" (1.638 m)  Wt 257 lb 12.8 oz (116.937 kg)  BMI 43.58 kg/m2  Well developed, pleasant female in no distress HEENT: PERRL, EOMI, conjunctiva clear. Nasal mucosa--mod edema with clear-white mucus on the left. OP is clear. Sinuses are nontender.  L TM is completely normal.  Some cerumen posteriorly against canal wall. Canal itself looks normal without erythema, swelling or exudate. R TM is obscured  by cerumen, but the EAC is completley normal. Neck: no lymphadenopathy or mass  ASSESSMENT/PLAN:  Otalgia, bilateral  Allergic rhinitis, unspecified allergic rhinitis type  Essential hypertension, benign  No evidence of acute infection.  She likely has dryness contributing to her itchy ears, as well as allergies.  Continue anthistamines.  ETD likely contributing to dicomfort.    Stop using Q-tips.  If you have decreased hearing in the right ear, it is from wax, and try getting some Debrox drops. If not improving, return here for an ear lavage. Use some mineral oil or olive oil (just a drop) to help with dry, itchy ears. If itching or pain in the canal gets worse, call us for antibiotic drops (that will also have a steroid in it to help with the itching).  Continue to treat your allergies with the flonase and zyrtec.  The ear discomfort on the left may also partly be related to eustachian tube dysfunction from the congestion.  If worsening discomfort or itching, can call for ABX/steroid drop

## 2015-06-15 ENCOUNTER — Other Ambulatory Visit: Payer: Self-pay | Admitting: Internal Medicine

## 2015-06-30 HISTORY — PX: JOINT REPLACEMENT: SHX530

## 2015-07-23 ENCOUNTER — Encounter: Payer: Self-pay | Admitting: Family Medicine

## 2015-07-23 MED ORDER — ALPRAZOLAM 0.5 MG PO TABS: 0.2500 mg | ORAL_TABLET | Freq: Three times a day (TID) | ORAL | Status: DC | PRN

## 2015-07-23 NOTE — Telephone Encounter (Signed)
See pt's message. Refill request never came from pharmacy.  Please refill her alprazolam (#20, no add'l refill) to her [facility] and document in chart.    Thanks

## 2015-08-15 ENCOUNTER — Other Ambulatory Visit: Payer: Self-pay | Admitting: Family Medicine

## 2015-08-18 ENCOUNTER — Other Ambulatory Visit: Payer: Self-pay | Admitting: Family Medicine

## 2015-09-02 ENCOUNTER — Encounter: Payer: Self-pay | Admitting: Family Medicine

## 2015-09-02 ENCOUNTER — Ambulatory Visit (INDEPENDENT_AMBULATORY_CARE_PROVIDER_SITE_OTHER): Payer: Managed Care, Other (non HMO) | Admitting: Family Medicine

## 2015-09-02 VITALS — BP 136/80 | HR 68 | Ht 64.5 in | Wt 256.6 lb

## 2015-09-02 DIAGNOSIS — I10 Essential (primary) hypertension: Secondary | ICD-10-CM

## 2015-09-02 DIAGNOSIS — J4540 Moderate persistent asthma, uncomplicated: Secondary | ICD-10-CM | POA: Diagnosis not present

## 2015-09-02 DIAGNOSIS — E782 Mixed hyperlipidemia: Secondary | ICD-10-CM | POA: Diagnosis not present

## 2015-09-02 DIAGNOSIS — Z6841 Body Mass Index (BMI) 40.0 and over, adult: Secondary | ICD-10-CM | POA: Diagnosis not present

## 2015-09-02 DIAGNOSIS — F325 Major depressive disorder, single episode, in full remission: Secondary | ICD-10-CM | POA: Diagnosis not present

## 2015-09-02 DIAGNOSIS — M179 Osteoarthritis of knee, unspecified: Secondary | ICD-10-CM | POA: Diagnosis not present

## 2015-09-02 DIAGNOSIS — Z5181 Encounter for therapeutic drug level monitoring: Secondary | ICD-10-CM

## 2015-09-02 DIAGNOSIS — G4733 Obstructive sleep apnea (adult) (pediatric): Secondary | ICD-10-CM

## 2015-09-02 DIAGNOSIS — E8881 Metabolic syndrome: Secondary | ICD-10-CM | POA: Diagnosis not present

## 2015-09-02 DIAGNOSIS — F411 Generalized anxiety disorder: Secondary | ICD-10-CM

## 2015-09-02 DIAGNOSIS — M1710 Unilateral primary osteoarthritis, unspecified knee: Secondary | ICD-10-CM

## 2015-09-02 DIAGNOSIS — K219 Gastro-esophageal reflux disease without esophagitis: Secondary | ICD-10-CM

## 2015-09-02 LAB — CBC WITH DIFFERENTIAL/PLATELET
BASOS ABS: 0 10*3/uL (ref 0.0–0.1)
BASOS PCT: 0 % (ref 0–1)
EOS ABS: 0.2 10*3/uL (ref 0.0–0.7)
EOS PCT: 3 % (ref 0–5)
HEMATOCRIT: 35.8 % — AB (ref 36.0–46.0)
Hemoglobin: 11.7 g/dL — ABNORMAL LOW (ref 12.0–15.0)
Lymphocytes Relative: 35 % (ref 12–46)
Lymphs Abs: 2.2 10*3/uL (ref 0.7–4.0)
MCH: 29.6 pg (ref 26.0–34.0)
MCHC: 32.7 g/dL (ref 30.0–36.0)
MCV: 90.6 fL (ref 78.0–100.0)
MONO ABS: 0.3 10*3/uL (ref 0.1–1.0)
MPV: 10.5 fL (ref 8.6–12.4)
Monocytes Relative: 5 % (ref 3–12)
Neutro Abs: 3.6 10*3/uL (ref 1.7–7.7)
Neutrophils Relative %: 57 % (ref 43–77)
PLATELETS: 250 10*3/uL (ref 150–400)
RBC: 3.95 MIL/uL (ref 3.87–5.11)
RDW: 13.8 % (ref 11.5–15.5)
WBC: 6.4 10*3/uL (ref 4.0–10.5)

## 2015-09-02 LAB — POCT GLYCOSYLATED HEMOGLOBIN (HGB A1C): HEMOGLOBIN A1C: 5.5

## 2015-09-02 MED ORDER — LOSARTAN POTASSIUM 25 MG PO TABS: 25.0000 mg | ORAL_TABLET | Freq: Every day | ORAL | Status: DC

## 2015-09-02 MED ORDER — CITALOPRAM HYDROBROMIDE 40 MG PO TABS: ORAL_TABLET | ORAL | Status: DC

## 2015-09-02 MED ORDER — BUPROPION HCL ER (XL) 300 MG PO TB24: 300.0000 mg | ORAL_TABLET | Freq: Every day | ORAL | Status: DC

## 2015-09-02 MED ORDER — DICLOFENAC SODIUM 75 MG PO TBEC: 75.0000 mg | DELAYED_RELEASE_TABLET | Freq: Two times a day (BID) | ORAL | Status: DC

## 2015-09-02 MED ORDER — METFORMIN HCL ER 500 MG PO TB24: ORAL_TABLET | ORAL | Status: DC

## 2015-09-02 MED ORDER — ATORVASTATIN CALCIUM 40 MG PO TABS: 40.0000 mg | ORAL_TABLET | Freq: Every day | ORAL | Status: DC

## 2015-09-02 NOTE — Patient Instructions (Signed)
Please call Dr. Oletta Lamas. You are likely due (or past due) for a repeat colonoscopy.  Please consider restarting the [employer], especially since your exercise is more limited due to your knee pain.  Consider discussing teeth grinding/clenching with your dentist at your next visit.

## 2015-09-02 NOTE — Progress Notes (Signed)
Chief Complaint  Patient presents with  . Hyperlipidemia    fasting med check.    She had been seen once for ear aches. She continues to have some discomfort R>L and she thinks it is related to her jaw.  Notices it more when her mouth is open, and changing jaw position relieves the discomfort.  She does report she clenches her teeth.  She isn't aware that she grinds her teeth, but she does occasionally wake up with bitemporal headaches.  Depression/anxiety: On Wellbutrin and citalopram. Denies side effects. She continues to do well on her current regimen, using alprazolam infrequently (needed about once a week in January, and 1/2 tablet is effective. Her dad is in [place], has Parkinson's.  Not really enjoying it there; she visits 4-5d/week.  Hyperlipidemia follow-up: Patient is reportedly following a low-fat, low cholesterol diet. Compliant with medications and denies medication side effects.   Hypertension follow-up: Blood pressures aren't checked elsewhere. Denies dizziness, headaches (just occasional bitemporal headaches in the morning, as above), chest pain, edema. Denies side effects of medications. She has a dry cough, which started prior to BP meds and seems to be related to reflux, as cough is worse if she doesn't take prilosec. Cough is infrequent/tolerable.   PCOS/insulin resistance: Compliant with metformin. Denies polydipsia, polyuria. She has some tingling in her feet (on both feet, between the great and 2nd toes (medially), ongoing, without change). Last eye appt exam was 06/21/15, and all normal per patient.  She got new glasses.  GERD--takes Prilosec daily. Can skip it for a few days if she avoids certain foods, including dairy,but usually takes it. Cough recurs when she stops the prilosec. She takes it most days, 5/7 days of the week. Denies dysphagia.   OA--L knee. She sees Dr. Lynann Bologna. She stood up one day, and almost fell.  Walks fine for 50 steps, then something shifts and  hurts.  Pain with stairs.  She has a brace, and had second steroid injection in December (no benefit).  Next step is surgery, which she is trying to put off. She had been stable prior to this point.   Needs to get things figured out (taking care of dad, who can help her, etc)  She is taking diclofenac once daily and tylenol arthritis prn.  Has Norco for severe pain (uses 1-2x/week). She previously reported that MRI of L knee (2 years ago, per pt today) reportedly didn't show any tears, just arthritis and cartilage damage. She also previously had full series of 3 of the Synvisc; doesn't report much benefit.   Asthma and allergies--well controlled on her current regimen. She sees pulmonary. Not needing rescue inhaler. Her asthma is stable, without any change. She also sees pulmonary for OSA. She is wearing her CPAP and doing well.  Obesity: Her weight is stable (had gained a few pounds and lost them, per our records.  She reports she lost weight over the holidays, due to not eating much--struggles with moods over the holidays--but regained.). She continues to follow a regular exercise regimen. Still doing boot camp, down to 3days/week due to knee pain. She has used [employer] in the past, but dropped that when stressors increased, too stressful to have to worry about inputting her food. She is ready to "think about" doing this again, not ready to start doing it (just think about).  Some hesitancy to start urine stream. Doesn't completely empty, continues to sit there and can void more, and then feels like she empties well. A  lot of caffeine lately; these symptoms started prior to increase in caffeine. She hasn't yet discussed these symptoms with her GYN.  No known prolapse or cystocele.  Health maintenance: She had a colonoscopy --can't recall date ('05 or '07, Dr. Oletta Lamas). UTD with GYN, had mammo in November. She had Hepatitis C screen through GYN and was negative. LMP 2010.  She reports her [place]  showed menopause.  PMH, PSH, SH reviewed  Outpatient Encounter Prescriptions as of 09/02/2015  Medication Sig Note  . acetaminophen (TYLENOL) 650 MG CR tablet Take 1,300 mg by mouth every 8 (eight) hours as needed for pain. 09/02/2015: Using Tylenol Arthritis prn, once or twice a week  . ALPRAZolam (XANAX) 0.5 MG tablet Take 0.5-1 tablets (0.25-0.5 mg total) by mouth 3 (three) times daily as needed for anxiety. 09/02/2015: Uses 1/2 tablet as needed, infrequently (1-2/week when very stressed)  . atorvastatin (LIPITOR) 40 MG tablet Take 1 tablet (40 mg total) by mouth daily.   Marland Kitchen buPROPion (WELLBUTRIN XL) 300 MG 24 hr tablet Take 1 tablet (300 mg total) by mouth daily.   . Calcium Carbonate-Vitamin D (CALCIUM + D) 600-200 MG-UNIT TABS Take 2 tablets by mouth daily.     . cetirizine (ZYRTEC) 10 MG tablet Take 10 mg by mouth daily.     . citalopram (CELEXA) 40 MG tablet take 1 tablet by mouth once daily   . diclofenac (VOLTAREN) 75 MG EC tablet Take 1 tablet (75 mg total) by mouth 2 (two) times daily.   . fish oil-omega-3 fatty acids 1000 MG capsule Take 3 g by mouth daily.    . fluticasone (FLONASE) 50 MCG/ACT nasal spray INSTILL 1 TO 2 SPRAYS INTO EACH NOSTRIL ONCE A DAY   . HYDROcodone-acetaminophen (NORCO/VICODIN) 5-325 MG tablet take 1 to 2 tablets by mouth every 6 hours if needed for pain 09/02/2015: Uses 2/week (Dr. Lynann Bologna)  . losartan (COZAAR) 25 MG tablet Take 1 tablet (25 mg total) by mouth daily.   . medroxyPROGESTERone (PROVERA) 10 MG tablet Take 10 mg by mouth daily.   . metFORMIN (GLUCOPHAGE-XR) 500 MG 24 hr tablet take 2 tablets by mouth every morning and 1 tablet every evening   . mometasone-formoterol (DULERA) 100-5 MCG/ACT AERO Inhale 2 puffs into the lungs 2 (two) times daily.   . Multiple Vitamins-Minerals (MULTIVITAMIN WITH MINERALS) tablet Take 1 tablet by mouth daily.     Marland Kitchen omeprazole (PRILOSEC) 20 MG capsule Take 20 mg by mouth daily.     . vitamin B-12 (CYANOCOBALAMIN) 1000 MCG  tablet Take 1,000 mcg by mouth daily.     . vitamin C (ASCORBIC ACID) 500 MG tablet Take 500 mg by mouth daily.   . [DISCONTINUED] atorvastatin (LIPITOR) 40 MG tablet Take 1 tablet (40 mg total) by mouth daily.   . [DISCONTINUED] buPROPion (WELLBUTRIN XL) 300 MG 24 hr tablet take 1 tablet by mouth once daily   . [DISCONTINUED] citalopram (CELEXA) 40 MG tablet take 1 tablet by mouth once daily   . [DISCONTINUED] diclofenac (VOLTAREN) 75 MG EC tablet take 1 tablet by mouth twice a day (Patient taking differently: daily)   . [DISCONTINUED] HYDROcodone-acetaminophen (NORCO) 10-325 MG tablet TAKE 1/2 -1 TABLET BY MOUTH EVERY 8-12 HOURS AS NEEDED FOR PAIN 06/10/2015: Received from: External Pharmacy  . [DISCONTINUED] losartan (COZAAR) 25 MG tablet take 1 tablet by mouth once daily   . [DISCONTINUED] metFORMIN (GLUCOPHAGE-XR) 500 MG 24 hr tablet take 2 tablets by mouth every morning and 1 tablet every evening   .  cyclobenzaprine (FLEXERIL) 5 MG tablet as needed. Reported on 09/02/2015 02/14/2014: Takes prn back pain (last rx'd by Dr. Leo Grosser; also helps with endometriosis pain)   No facility-administered encounter medications on file as of 09/02/2015.   No Known Allergies  ROS: no fever, chills, URI symptoms, shortness of breath, chest pain, palpitations, nausea, vomiting, bowel changes. Urinary complaints as per HPI.  Denies hematuria, dysuria, incontinence.  Denies dizziness.  +headaches as per HPI (bitemporal), and intermittent ear aches.  Denies hearing loss.  Denies vaginal bleeding, discharge, any other bleeding, bruising, rash.  Numbness between 1st and 2nd toes at webspace, unchanged. +knee pain, no other joint pains.  Depression and anxiety are controlled   PHYSICAL EXAM: BP 136/80 mmHg  Pulse 68  Ht 5' 4.5" (1.638 m)  Wt 256 lb 9.6 oz (116.393 kg)  BMI 43.38 kg/m2  Well developed, pleasant, morbidly obese female in no distress HEENT: PERRL, EOMI, conjunctiva clear. OP clear. TM's and EAC's  normal, nasal mucosa normal without purulence, sinuses nontender Neck: no lymphadenopathy or thyromegaly, no carotid bruit Heart: regular rate and rhythm, no murmur Lungs: clear bilaterally, no wheezes Abdomen: obese, soft, nontender, no mass, no organomegaly  Back: no spinal or CVA tendeness Extremities: normal pulses, no edema.  Skin: no suspicious lesions, bruising, normal turgor Neuro: alert and oriented, normal gait, cranial nerves Psych: normal mood, affect, hygiene and grooming  Lab Results  Component Value Date   HGBA1C 5.5 09/02/2015    ASSESSMENT/PLAN:  Essential hypertension - higher than usual today; continue current meds, exercise, weight loss - Plan: losartan (COZAAR) 25 MG tablet, Comprehensive metabolic panel  Insulin resistance syndrome - doing well; continue Metformin - Plan: HgB A1c, metFORMIN (GLUCOPHAGE-XR) 500 MG 24 hr tablet  Morbid obesity with body mass index of 45.0-49.9 in adult [facility]) - counseled re: diet, encouraged to restart MFP for accountability, esp since exercise is limited by knee pain  Anxiety state - well controlled - Plan: citalopram (CELEXA) 40 MG tablet  Moderate persistent asthma in adult without complication - controlled; f/b pulmonary  Gastroesophageal reflux disease, esophagitis presence not specified - controlled with prilosec. weight loss rec  OSA (obstructive sleep apnea) - continue CPAP; weight loss encouraged  Mixed hyperlipidemia - Plan: atorvastatin (LIPITOR) 40 MG tablet, Lipid panel  Osteoarthritis of knee, unspecified laterality, unspecified osteoarthritis type - encouraged weight loss, and to consider surgery - Plan: diclofenac (VOLTAREN) 75 MG EC tablet  Depression, major, in remission ([place]) - doing well on current regimen - Plan: citalopram (CELEXA) 40 MG tablet, buPROPion (WELLBUTRIN XL) 300 MG 24 hr tablet  Medication monitoring encounter - Plan: Lipid panel, Comprehensive metabolic panel, CBC with  Differential/Platelet  F/u 6 months, sooner prn  To discuss her jaw/ear pain and clenching/grinding/headaches with her dentist To discuss her urinary complaints with her GYN.  She declined giving urine sample for evaluation today.    Due for colonoscopy--advised to contact Dr. Oletta Lamas to schedule

## 2015-09-03 LAB — COMPREHENSIVE METABOLIC PANEL
ALBUMIN: 4.2 g/dL (ref 3.6–5.1)
ALK PHOS: 71 U/L (ref 33–130)
ALT: 31 U/L — AB (ref 6–29)
AST: 28 U/L (ref 10–35)
BILIRUBIN TOTAL: 0.4 mg/dL (ref 0.2–1.2)
BUN: 13 mg/dL (ref 7–25)
CO2: 24 mmol/L (ref 20–31)
CREATININE: 0.87 mg/dL (ref 0.50–1.05)
Calcium: 9.7 mg/dL (ref 8.6–10.4)
Chloride: 107 mmol/L (ref 98–110)
Glucose, Bld: 98 mg/dL (ref 65–99)
Potassium: 4.4 mmol/L (ref 3.5–5.3)
SODIUM: 141 mmol/L (ref 135–146)
TOTAL PROTEIN: 7 g/dL (ref 6.1–8.1)

## 2015-09-03 LAB — LIPID PANEL
Cholesterol: 153 mg/dL (ref 125–200)
HDL: 43 mg/dL — AB (ref >=46)
LDL CALC: 74 mg/dL (ref <130)
TRIGLYCERIDES: 182 mg/dL — AB (ref <150)
Total CHOL/HDL Ratio: 3.6 Ratio (ref <=5.0)
VLDL: 36 mg/dL — ABNORMAL HIGH (ref <30)

## 2015-09-16 ENCOUNTER — Encounter: Payer: Self-pay | Admitting: Internal Medicine

## 2015-09-16 NOTE — Telephone Encounter (Signed)
Spoke with pt via the telephone. She has been scheduled to see MR on 10/03/2015 at 11:30am. Nothing further was needed at this time.

## 2015-09-23 ENCOUNTER — Encounter: Payer: Self-pay | Admitting: Adult Health

## 2015-09-23 ENCOUNTER — Ambulatory Visit (INDEPENDENT_AMBULATORY_CARE_PROVIDER_SITE_OTHER): Payer: Managed Care, Other (non HMO) | Admitting: Adult Health

## 2015-09-23 VITALS — BP 138/80 | HR 92 | Temp 98.7°F | Ht 64.0 in | Wt 262.0 lb

## 2015-09-23 DIAGNOSIS — I10 Essential (primary) hypertension: Secondary | ICD-10-CM | POA: Diagnosis not present

## 2015-09-23 DIAGNOSIS — G4733 Obstructive sleep apnea (adult) (pediatric): Secondary | ICD-10-CM

## 2015-09-23 DIAGNOSIS — Z01818 Encounter for other preprocedural examination: Secondary | ICD-10-CM | POA: Diagnosis not present

## 2015-09-23 DIAGNOSIS — J4540 Moderate persistent asthma, uncomplicated: Secondary | ICD-10-CM

## 2015-09-23 MED ORDER — HYDROCHLOROTHIAZIDE 25 MG PO TABS: 25.0000 mg | ORAL_TABLET | Freq: Every day | ORAL | Status: DC

## 2015-09-23 NOTE — Progress Notes (Signed)
Subjective:    Patient ID: Susan Gould, female    DOB: March 14, 1966, 50 y.o.   MRN: [id number]  HPI 50 yo female never  smoker with asthma and OSA on CPAP   09/23/2015 Follow up : Asthma and OSA  Pt returns for a follow up .  She is having a Left knee partial knee replacement. Due to DJD.  She needs a surgical clearance.  She says her Asthma is doing very well.  Rare use of SABA .  Well controlled on Dulera.  Denies sob, chest pain, orthopnea or edema Spirometry shows FEV1 83%, ratio 65.  She is staying overnight post op . Denies previous known problems with surgeries.   She is on CPAP At bedtime  For OSA Says she can not live without this.  Wears every night for 8hrs .  Feels rested wtihout sign daytime sleepiness.  She denies chest pain, orthopnea or edema.   [place] Postperative Pulmonary Risk Score Comment Score  Type of surgery - abd ao aneurysm (27), thoracic (21), neurosurgery / upper abdominal / vascular (21), neck (11) Knee surgery  5   Emergency Surgery - (11) Elective  0  ALbumin < 3 or poor nutritional state - (9) Unknown  0  BUN > 30 -  (8) Unknown  0   Partial or completely dependent functional status - (7) Independent  0   COPD -  (6) No  0  Age - 60 to 40 (4), > 70  (6) 51 0  TOTAL  5  Risk Stratifcation scores  - < 10, 11-19, 20-27, 28-40, >40  Low      Past Medical History  Diagnosis Date  . Asthma     (Dr. Joya Gaskins)  . Obesity   . Hypertension   . GERD (gastroesophageal reflux disease)   . Allergic rhinitis, cause unspecified     cat/dog/ragweed/mold/wool, s/p immunotherapy x 3-4 yrs  . Hyperlipidemia   . OSA (obstructive sleep apnea)     on CPAP (Dr. Gwenette Greet)  . Depression 2006  . Anxiety 2006  . Colon polyps 2007    Dr. Oletta Lamas (she was told repeat in 10 years)  . Hemorrhoid     internal and external  . Endometriosis     Dr. Leo Grosser  . Insulin resistance     Dr. Leo Grosser  . Family history of breast cancer in female   . Medial  meniscus tear     Left. Surgery [name].  NOT seen on MRI 06/2013   Current Outpatient Prescriptions on File Prior to Visit  Medication Sig Dispense Refill  . acetaminophen (TYLENOL) 650 MG CR tablet Take 1,300 mg by mouth every 8 (eight) hours as needed for pain.    Marland Kitchen ALPRAZolam (XANAX) 0.5 MG tablet Take 0.5-1 tablets (0.25-0.5 mg total) by mouth 3 (three) times daily as needed for anxiety. 20 tablet 0  . atorvastatin (LIPITOR) 40 MG tablet Take 1 tablet (40 mg total) by mouth daily. 30 tablet 5  . buPROPion (WELLBUTRIN XL) 300 MG 24 hr tablet Take 1 tablet (300 mg total) by mouth daily. 30 tablet 5  . Calcium Carbonate-Vitamin D (CALCIUM + D) 600-200 MG-UNIT TABS Take 2 tablets by mouth daily.      . cetirizine (ZYRTEC) 10 MG tablet Take 10 mg by mouth daily.      . citalopram (CELEXA) 40 MG tablet take 1 tablet by mouth once daily 30 tablet 5  . cyclobenzaprine (FLEXERIL) 5 MG tablet as needed.  Reported on 09/02/2015    . diclofenac (VOLTAREN) 75 MG EC tablet Take 1 tablet (75 mg total) by mouth 2 (two) times daily. 60 tablet 1  . fish oil-omega-3 fatty acids 1000 MG capsule Take 3 g by mouth daily.     . fluticasone (FLONASE) 50 MCG/ACT nasal spray INSTILL 1 TO 2 SPRAYS INTO EACH NOSTRIL ONCE A DAY 16 g 11  . HYDROcodone-acetaminophen (NORCO/VICODIN) 5-325 MG tablet take 1 to 2 tablets by mouth every 6 hours if needed for pain  0  . losartan (COZAAR) 25 MG tablet Take 1 tablet (25 mg total) by mouth daily. 30 tablet 5  . medroxyPROGESTERone (PROVERA) 10 MG tablet Take 10 mg by mouth daily.    . metFORMIN (GLUCOPHAGE-XR) 500 MG 24 hr tablet take 2 tablets by mouth every morning and 1 tablet every evening 90 tablet 5  . mometasone-formoterol (DULERA) 100-5 MCG/ACT AERO Inhale 2 puffs into the lungs 2 (two) times daily. 1 Inhaler 6  . Multiple Vitamins-Minerals (MULTIVITAMIN WITH MINERALS) tablet Take 1 tablet by mouth daily.      Marland Kitchen omeprazole (PRILOSEC) 20 MG capsule Take 20 mg by mouth  daily.      . vitamin B-12 (CYANOCOBALAMIN) 1000 MCG tablet Take 1,000 mcg by mouth daily.      . vitamin C (ASCORBIC ACID) 500 MG tablet Take 500 mg by mouth daily.     No current facility-administered medications on file prior to visit.      Review of Systems  Constitutional:   No  weight loss, night sweats,  Fevers, chills, fatigue, or  lassitude.  HEENT:   No headaches,  Difficulty swallowing,  Tooth/dental problems, or  Sore throat,                No sneezing, itching, ear ache, nasal congestion, post nasal drip,   CV:  No chest pain,  Orthopnea, PND, swelling in lower extremities, anasarca, dizziness, palpitations, syncope.   GI  No heartburn, indigestion, abdominal pain, nausea, vomiting, diarrhea, change in bowel habits, loss of appetite, bloody stools.   Resp: No shortness of breath with exertion or at rest.  No excess mucus, no productive cough,  No non-productive cough,  No coughing up of blood.  No change in color of mucus.  No wheezing.  No chest wall deformity  Skin: no rash or lesions.  GU: no dysuria, change in color of urine, no urgency or frequency.  No flank pain, no hematuria   MS:  +knee pain.   Psych:  No change in mood or affect. No depression or anxiety.  No memory loss.         Objective:   Physical Exam Filed Vitals:   09/23/15 1111  BP: 138/80  Pulse: 92  Temp: 98.7 F (37.1 C)  TempSrc: Oral  Height: 5' 4"  (1.626 m)  Weight: 262 lb (118.842 kg)  SpO2: 98%   GEN: A/Ox3; pleasant , NAD , obese   HEENT:  [place]/AT,  EACs-clear, TMs-wnl, NOSE-clear, THROAT-clear, no lesions, no postnasal drip or exudate noted. Class 2 MP airway   NECK:  Supple w/ fair ROM; no JVD; normal carotid impulses w/o bruits; no thyromegaly or nodules palpated; no lymphadenopathy.  RESP  Clear  P & A; w/o, wheezes/ rales/ or rhonchi.no accessory muscle use, no dullness to percussion  CARD:  RRR, no m/r/g  , no peripheral edema, pulses intact, no cyanosis or  clubbing.  GI:   Soft & nt; nml bowel sounds; no  organomegaly or masses detected.  Musco: Warm bil, no deformities or joint swelling noted.   Neuro: alert, no focal deficits noted.    Skin: Warm, no lesions or rashes   Prestin Munch NP-C  [facility]  09/23/2015     Assessment & Plan:

## 2015-09-23 NOTE — Assessment & Plan Note (Addendum)
Recently changed from lisinopril hctz to losartan  Advised to follow up with PCP  Wants to have HCTZ rx as was on this previously , feels ankles not as puffy on this rx.  Restart HCTZ 96m daily And follow up with PCP for b/p management.

## 2015-09-23 NOTE — Assessment & Plan Note (Signed)
Well controlled on regimen  Plan   cont on Dulera  Use albuterol As needed

## 2015-09-23 NOTE — Assessment & Plan Note (Signed)
Pt is considered a low risk with well controlled Asthma and OSA on therapy .  She will need to continue on [facility] for maintence.  Cont on CPAP At bedtime  , advised to take mask and machine to use post op As needed  And At bedtime   Reviewed possible complication with goal to early mobilize and pulmonary hygiene to prevent post op complications.    Major Pulmonary risks identified in the multifactorial risk analysis are but not limited to a) pneumonia; b) recurrent intubation risk; c) prolonged or recurrent acute respiratory failure needing mechanical ventilation; d) prolonged hospitalization; e) DVT/Pulmonary embolism; f) Acute Pulmonary edema  Recommend 1. Short duration of surgery as much as possible and avoid paralytic if possible 2. Recovery in step down or ICU with Pulmonary consultation if indicated.  3. DVT prophylaxis 4. Aggressive pulmonary toilet with o2, bronchodilatation, and incentive spirometry and early ambulation 5. CPAP At bedtime  And post op As needed  . Limit sedation as able.

## 2015-09-23 NOTE — Assessment & Plan Note (Signed)
Severe OSA well controlled on CPAP   Plan  Cont on CPAP  Download on return

## 2015-09-23 NOTE — Patient Instructions (Addendum)
Continue on Dulera 2 puffs Twice daily  , rinse after use.  Continue on CPAP At bedtime   Bring CPAP chip for download on return.  Go for preop labs and chest xray.  Good luck with upcoming knee surgery .  Take your [facility] prior to surgery and as scheduled.  Take your CPAP machine /mask to surgery .  follow up Dr. Chase Caller in 3 months and As needed

## 2015-09-27 ENCOUNTER — Ambulatory Visit: Payer: Managed Care, Other (non HMO) | Admitting: Internal Medicine

## 2015-10-03 ENCOUNTER — Ambulatory Visit: Payer: Managed Care, Other (non HMO) | Admitting: Internal Medicine

## 2015-11-06 ENCOUNTER — Ambulatory Visit: Payer: Managed Care, Other (non HMO) | Admitting: Internal Medicine

## 2015-12-11 ENCOUNTER — Encounter: Payer: Self-pay | Admitting: Internal Medicine

## 2015-12-11 ENCOUNTER — Ambulatory Visit (INDEPENDENT_AMBULATORY_CARE_PROVIDER_SITE_OTHER): Payer: Managed Care, Other (non HMO) | Admitting: Internal Medicine

## 2015-12-11 DIAGNOSIS — G4733 Obstructive sleep apnea (adult) (pediatric): Secondary | ICD-10-CM

## 2015-12-11 DIAGNOSIS — Z6841 Body Mass Index (BMI) 40.0 and over, adult: Secondary | ICD-10-CM | POA: Diagnosis not present

## 2015-12-11 DIAGNOSIS — IMO0001 Reserved for inherently not codable concepts without codable children: Secondary | ICD-10-CM

## 2015-12-11 NOTE — Patient Instructions (Addendum)
Order- DME [facility]- we can continue CPAP auto 5-20, mask of choice, humidifier, supplies, AirView   Dx OSA  Please call if we can help  We will see you again in a year for your sleep apnea follow-up

## 2015-12-11 NOTE — Progress Notes (Signed)
12/11/2015-50 year old female never smoker followed for OSA, complicated by obesity, GERD, HBP NPSG 04/23/06- AHI 42/ hr, desat to 89% Followed by Dr. Chase Caller for asthma [facility] patient; DME [facility] Pt; Wears CPAP nightly and DL attached.  CPAP/auto 5-20 [facility] Patient Download confirms excellent compliance 99%/4 hours, excellent control AHI 1.8/hour. Very comfortable with CPAP indefinitely sleeping better. Nasal pillows mask.  ROS-see HPI   Negative unless "+" Constitutional:    weight loss, night sweats, fevers, chills, fatigue, lassitude. HEENT:    headaches, difficulty swallowing, tooth/dental problems, sore throat,       sneezing, itching, ear ache, nasal congestion, post nasal drip, snoring CV:    chest pain, orthopnea, PND, swelling in lower extremities, anasarca,                                                       dizziness, palpitations Resp:   shortness of breath with exertion or at rest.                productive cough,   non-productive cough, coughing up of blood.              change in color of mucus.  wheezing.   Skin:    rash or lesions. GI:  No-   heartburn, indigestion, abdominal pain, nausea, vomiting, diarrhea,                 change in bowel habits, loss of appetite GU: dysuria, change in color of urine, no urgency or frequency.   flank pain. MS:   joint pain, stiffness, decreased range of motion, back pain. Neuro-     nothing unusual Psych:  change in mood or affect.  depression or anxiety.   memory loss.  OBJ- Physical Exam General- Alert, Oriented, Affect-appropriate, Distress- none acute + obese Skin- rash-none, lesions- none, excoriation- none Lymphadenopathy- none Head- atraumatic            Eyes- Gross vision intact, PERRLA, conjunctivae and secretions clear            Ears- Hearing, canals-normal            Nose- Clear, no-Septal dev, mucus, polyps, erosion, perforation             Throat- Mallampati III-IV , mucosa clear , drainage-  none, tonsils- atrophic Neck- flexible , trachea midline, no stridor , thyroid nl, carotid no bruit Chest - symmetrical excursion , unlabored           Heart/CV- RRR , no murmur , no gallop  , no rub, nl s1 s2                           - JVD- none , edema- none, stasis changes- none, varices- none           Lung- clear to P&A, wheeze- none, cough- none , dullness-none, rub- none           Chest wall-  Abd-  Br/ Gen/ Rectal- Not done, not indicated Extrem- cyanosis- none, clubbing, none, atrophy- none, strength- nl Neuro- grossly intact to observation

## 2015-12-16 ENCOUNTER — Encounter: Payer: Self-pay | Admitting: Family Medicine

## 2015-12-16 MED ORDER — HYDROCHLOROTHIAZIDE 25 MG PO TABS: 25.0000 mg | ORAL_TABLET | Freq: Every day | ORAL | Status: DC

## 2015-12-30 ENCOUNTER — Ambulatory Visit: Payer: Managed Care, Other (non HMO) | Admitting: Internal Medicine

## 2016-01-17 ENCOUNTER — Other Ambulatory Visit: Payer: Self-pay | Admitting: Family Medicine

## 2016-01-17 NOTE — Telephone Encounter (Signed)
Is this okay to refill? 

## 2016-01-17 NOTE — Telephone Encounter (Signed)
Has appt in September, will be due for labs then.

## 2016-02-10 DIAGNOSIS — Z96652 Presence of left artificial knee joint: Secondary | ICD-10-CM | POA: Diagnosis not present

## 2016-02-17 ENCOUNTER — Ambulatory Visit: Payer: Managed Care, Other (non HMO) | Admitting: Internal Medicine

## 2016-03-01 ENCOUNTER — Other Ambulatory Visit: Payer: Self-pay | Admitting: Family Medicine

## 2016-03-01 DIAGNOSIS — I10 Essential (primary) hypertension: Secondary | ICD-10-CM

## 2016-03-03 ENCOUNTER — Ambulatory Visit (INDEPENDENT_AMBULATORY_CARE_PROVIDER_SITE_OTHER): Payer: BLUE CROSS/BLUE SHIELD | Admitting: Internal Medicine

## 2016-03-03 ENCOUNTER — Encounter: Payer: Self-pay | Admitting: Internal Medicine

## 2016-03-03 VITALS — BP 108/68 | HR 96 | Ht 64.0 in | Wt 261.6 lb

## 2016-03-03 DIAGNOSIS — J4540 Moderate persistent asthma, uncomplicated: Secondary | ICD-10-CM | POA: Diagnosis not present

## 2016-03-03 DIAGNOSIS — Z23 Encounter for immunization: Secondary | ICD-10-CM

## 2016-03-03 NOTE — Patient Instructions (Signed)
ICD-9-CM ICD-10-CM   1. Moderate persistent asthma in adult without complication 158.68 Y57.49    Asthma appears well controlled  Plan - Continue Dulera one puff once or twice daily; till supply complte - Use albuterol as needed - Flu shot today 03/03/2016  Follow-up - 3 months or sooner if needed  - At follow-up consider if doing well Asmanex inhaled steroids alone

## 2016-03-03 NOTE — Telephone Encounter (Signed)
Pt has an app this month. Refilling for 30 days

## 2016-03-03 NOTE — Progress Notes (Signed)
Subjective:     Patient ID: Susan Gould, female   DOB: Jan 03, 1966, 50 y.o.   MRN: [id number]  HPI   OV 05/10/2015  Chief Complaint  Patient presents with  . Follow-up    Pt states her breathing is doing well. Pt c/o mild prod cough. Pt states overall she does not have any complaints.    50 year old obese  patient with asthma . She used tpo be followed by Dr Joya Gaskins for asthma.  Now transfer of care to Dr Chase Caller  - Reports life loing hx of asthma. Moved from [place] to [place] in the 80s. Last asthma exac > 25 years ago. Does not remoember last prednisone. This is routine 6 month fu. Last visit April 2016. Since then compliant as always with dulera 2 puff bid. Never used alb rescue. ACQ score is 0 out of 5 showing grood control Exhaled NO is 32 and above 25 and so slightly high but in grey zone.   - Specifically she does not wake up at night because of asthma. When she wakes up she is asymptomatic. She is not limited in her activities in the daytime because of asthma. She does not have any shortness of breath because of asthma. No wheezing because of asthma. No albuterol rescue use. Review of medications shows she is on ACE inhibitor but she does not have a cough  OV 03/03/2016  Chief Complaint  Patient presents with  . Follow-up    Says she is doing better, no coughing or sob or chest pain   FU mod persistent asthma  - doing well. Had knee surgery without problems. Asthma control questionnaire shows excellent control with score of 0 out of 5. She does not wake up in the middle of the night with asthma symptoms. When she wakes up she does not have any asthma symptoms. Activities are not limited because of asthma. She does not expands any shortness of breath or wheezing because of asthma. She only uses her Dulera one puff once daily and she uses it 2 times a month for rescue. However she is hesitant to down scale to inhaled steroid alone because she has several supply samples of Dulera. She  has not had flu shot but will have it today.   Asthma Control Panel - normal spiro feb 2015 -IgE - none done - cxr 2001 - clear - positive allergy testing by history greater than 25 years ago - eos 200cells march2017 05/10/2015  03/03/2016   Current Med Regimen dulera dulera - takes it 1 puff daily + prn 2 times a month  ACQ 5 point- 1 week. wtd avg score. <1.0 is good control 0.75-1.25 is grey zone. >1.25 poor control. Delta 0.5 is clinically meaningful ZERO 0  FeNO ppB 32ppb   FeV1  x x  Planned intervention  for visit contininue dulera, change ace to arb Cont dulera 1 puff twice daily        has a past medical history of Allergic rhinitis, cause unspecified; Anxiety (2006); Asthma; Colon polyps (2007); Depression (2006); Endometriosis; Family history of breast cancer in female; GERD (gastroesophageal reflux disease); Hemorrhoid; Hyperlipidemia; Hypertension; Insulin resistance; Medial meniscus tear; Obesity; and OSA (obstructive sleep apnea).   reports that she has never smoked. She has never used smokeless tobacco.  Past Surgical History:  Procedure Laterality Date  . CHOLECYSTECTOMY  1986  . FINGER GANGLION CYST EXCISION  2003   RIGHT 3rd finger  . KNEE SURGERY  '95 and '97   BILATERAL  KNEE  . LAPAROSCOPY  '98, '01   endometriosis (Dr. Leo Grosser)  . LASIK  2000  . tibial tuberoplasty  90's   left    No Known Allergies  Immunization History  Administered Date(s) Administered  . Influenza Split 04/17/2012  . Influenza Whole 03/29/2009, 03/17/2010, 04/23/2011  . Influenza,inj,Quad PF,36+ Mos 03/07/2013, 02/14/2014, 02/21/2015  . Pneumococcal Polysaccharide-23 03/07/2013  . Td 08/14/2004  . Tdap 07/29/2011    Family History  Problem Relation Age of Onset  . Diabetes Mother   . Hypertension Mother   . Hyperlipidemia Mother   . Arthritis Mother   . Cancer Mother 38    breast cancer  . Breast cancer Mother 37  . Diabetes Father   . Hyperlipidemia Father   .  Hypertension Father   . Asthma Father   . Arthritis Father   . Heart disease Father     CHF, hypokinesis  . COPD Father     due to asbestos exposure; former smoker  . Pulmonary embolism Father   . Hypertension Sister   . Breast cancer Paternal Aunt     87's  . Cancer Paternal Aunt     breast  . Heart disease Paternal Uncle   . Diabetes Paternal Uncle   . Kidney disease Paternal Uncle   . Colon cancer Maternal Grandmother     late 65's  . Cancer Maternal Grandmother     colon  . Heart disease Maternal Grandmother     MI in 77's     Current Outpatient Prescriptions:  .  acetaminophen (TYLENOL) 650 MG CR tablet, Take 1,300 mg by mouth every 8 (eight) hours as needed for pain., Disp: , Rfl:  .  ALPRAZolam (XANAX) 0.5 MG tablet, Take 0.5-1 tablets (0.25-0.5 mg total) by mouth 3 (three) times daily as needed for anxiety., Disp: 20 tablet, Rfl: 0 .  atorvastatin (LIPITOR) 40 MG tablet, Take 1 tablet (40 mg total) by mouth daily., Disp: 30 tablet, Rfl: 5 .  buPROPion (WELLBUTRIN XL) 300 MG 24 hr tablet, Take 1 tablet (300 mg total) by mouth daily., Disp: 30 tablet, Rfl: 5 .  Calcium Carbonate-Vitamin D (CALCIUM + D) 600-200 MG-UNIT TABS, Take 2 tablets by mouth daily.  , Disp: , Rfl:  .  cetirizine (ZYRTEC) 10 MG tablet, Take 10 mg by mouth daily.  , Disp: , Rfl:  .  citalopram (CELEXA) 40 MG tablet, take 1 tablet by mouth once daily, Disp: 30 tablet, Rfl: 5 .  cyclobenzaprine (FLEXERIL) 5 MG tablet, as needed. Reported on 09/02/2015, Disp: , Rfl:  .  diclofenac (VOLTAREN) 75 MG EC tablet, take 1 tablet by mouth twice a day, Disp: 60 tablet, Rfl: 1 .  fish oil-omega-3 fatty acids 1000 MG capsule, Take 3 g by mouth daily. , Disp: , Rfl:  .  fluticasone (FLONASE) 50 MCG/ACT nasal spray, INSTILL 1 TO 2 SPRAYS INTO EACH NOSTRIL ONCE A DAY, Disp: 16 g, Rfl: 11 .  hydrochlorothiazide (HYDRODIURIL) 25 MG tablet, Take 1 tablet (25 mg total) by mouth daily., Disp: 30 tablet, Rfl: 2 .  losartan  (COZAAR) 25 MG tablet, take 1 tablet by mouth once daily, Disp: 30 tablet, Rfl: 0 .  medroxyPROGESTERone (PROVERA) 10 MG tablet, Take 10 mg by mouth daily., Disp: , Rfl:  .  metFORMIN (GLUCOPHAGE-XR) 500 MG 24 hr tablet, take 2 tablets by mouth every morning and 1 tablet every evening, Disp: 90 tablet, Rfl: 5 .  mometasone-formoterol (DULERA) 100-5 MCG/ACT AERO, Inhale 2 puffs into  the lungs 2 (two) times daily., Disp: 1 Inhaler, Rfl: 6 .  Multiple Vitamins-Minerals (MULTIVITAMIN WITH MINERALS) tablet, Take 1 tablet by mouth daily.  , Disp: , Rfl:  .  omeprazole (PRILOSEC) 20 MG capsule, Take 20 mg by mouth daily.  , Disp: , Rfl:  .  vitamin B-12 (CYANOCOBALAMIN) 1000 MCG tablet, Take 1,000 mcg by mouth daily.  , Disp: , Rfl:  .  vitamin C (ASCORBIC ACID) 500 MG tablet, Take 500 mg by mouth daily., Disp: , Rfl:  .  HYDROcodone-acetaminophen (NORCO/VICODIN) 5-325 MG tablet, take 1 to 2 tablets by mouth every 6 hours if needed for pain, Disp: , Rfl: 0   Review of Systems     Objective:   Physical Exam  Constitutional: She is oriented to person, place, and time. She appears well-developed and well-nourished. No distress.  Body mass index is 44.9 kg/m.   HENT:  Head: Normocephalic and atraumatic.  Right Ear: External ear normal.  Left Ear: External ear normal.  Mouth/Throat: Oropharynx is clear and moist. No oropharyngeal exudate.  mallampatti class 3  Eyes: Conjunctivae and EOM are normal. Pupils are equal, round, and reactive to light. Right eye exhibits no discharge. Left eye exhibits no discharge. No scleral icterus.  Neck: Normal range of motion. Neck supple. No JVD present. No tracheal deviation present. No thyromegaly present.  Cardiovascular: Normal rate, regular rhythm, normal heart sounds and intact distal pulses.  Exam reveals no gallop and no friction rub.   No murmur heard. Pulmonary/Chest: Effort normal and breath sounds normal. No respiratory distress. She has no wheezes. She  has no rales. She exhibits no tenderness.  Abdominal: Soft. Bowel sounds are normal. She exhibits no distension and no mass. There is no tenderness. There is no rebound and no guarding.  Musculoskeletal: Normal range of motion. She exhibits no edema or tenderness.  Lymphadenopathy:    She has no cervical adenopathy.  Neurological: She is alert and oriented to person, place, and time. She has normal reflexes. No cranial nerve deficit. She exhibits normal muscle tone. Coordination normal.  Skin: Skin is warm and dry. No rash noted. She is not diaphoretic. No erythema. No pallor.  Psychiatric: She has a normal mood and affect. Her behavior is normal. Judgment and thought content normal.  Vitals reviewed.    Vitals:   03/03/16 1032  BP: 108/68  Pulse: 96  Weight: 261 lb 9.6 oz (118.7 kg)  Height: 5' 4"  (1.626 m)         Assessment:       ICD-9-CM ICD-10-CM   1. Moderate persistent asthma in adult without complication 557.32 K02.54        Plan:      Asthma appears well controlled  Plan - Continue Dulera one puff once or twice daily; till supply complte - Use albuterol as needed - Flu shot today 03/03/2016  Follow-up - 3 months or sooner if needed  - At follow-up consider if doing well Asmanex inhaled steroids alone     Dr. Brand Males, M.D., [facility].P Pulmonary and Critical Care Medicine Staff Physician [facility] Pager: [phone], If no answer or between  15:00h - 7:00h: call [phone]  03/03/2016 11:01 AM

## 2016-03-08 ENCOUNTER — Other Ambulatory Visit: Payer: Self-pay | Admitting: Family Medicine

## 2016-03-08 DIAGNOSIS — F411 Generalized anxiety disorder: Secondary | ICD-10-CM

## 2016-03-08 DIAGNOSIS — F325 Major depressive disorder, single episode, in full remission: Secondary | ICD-10-CM

## 2016-03-09 ENCOUNTER — Other Ambulatory Visit: Payer: Self-pay | Admitting: Family Medicine

## 2016-03-10 NOTE — Progress Notes (Signed)
Patient presents for med check, with complaint of increased stress.  Depression/anxiety: On Wellbutrin and citalopram. Denies side effects. Increased stress lately, asking for alprazolam refill.  Last filled 06/2015 for #20.  Her dad is in [place], has Parkinson's.  Not really enjoying it there, but doing a little better overall; she visits 4-5d/week. She is also having a lot of work stress, being pulled in many directions, having to work on weekends.  Working on getting more staff, should settle down, but struggling now.  She admits to using alcohol to help her relax.  Started with 2 cocktails/night, but decreased to 1 daily.  She tried xanax she had last night, which was also effective.  She hasn't been exercising as much, only 2x/week (doesn't want to pay for classes she isn't using, having to work late). She overate on vacation, and also stress eating recently.  Left tenosynovitis at the wrist, s/p injection by Dr. Lynann Bologna, wears a brace at night.  Still has some pain and plans to follow up with her.  Hyperlipidemia follow-up: Patient is reportedly following a low-fat, low cholesterol diet (though ate differently on vacation and related to stress--tries not to keep a lot of junk in the house). Compliant with medications and denies medication side effects.   Hypertension follow-up: Blood pressures aren't checked elsewhere. Denies dizziness, headaches (just some posterior headaches from stress at times), chest pain, edema. Denies side effects of medications. She has a dry cough, which started prior to BP meds and seems to be related to reflux, as cough is worse if she doesn't take prilosec. Cough is infrequent/tolerable.   PCOS/insulin resistance: Compliant with metformin. Denies polydipsia, polyuria. She has some tingling in her feet (on both feet, between the great and 2nd toes (medially), ongoing, without change. Last eye appt exam was 06/21/15, and all normal per patient.  She got new  glasses.  GERD--takes Prilosec daily. Can skip it for a few days if she avoids certain foods, including dairy,but usually takes it. Cough recurs when she stops the prilosec. She has been taking it daily, since her diet has not been as good.  Denies dysphagia.   OA--L knee. She had left unicompartmental knee arthroplasty by Dr. Mardelle Matte in April 2017. Still has some pain, achiness on rainy days, but she has improved overall, manageable, pleased with results.  Asthma and allergies--well controlled on her current regimen. She saw Dr. Chase Caller last week, and got her flu shot.  She is doing well, and he is going to consider switching her to steroid only medication at her next visit, if continues to do well, and not need rescue inhaler.  She also sees pulmonary for OSA. She is wearing her CPAP and doing well.  Obesity: Her weight is unchanged overall (regained just a bit, but the same as prior to her surgery).  Exercise decreased to 2x/week.  She has used [employer] in the past, but admits she isn't ready to restart this.  She continues to have some hesitancy to start urine stream. Doesn't completely empty, continues to sit there and can void more, and then feels like she empties well. She has appointment next month with GYN and plans to discuss this (to see if there is an anatomic problem contributing, ie prolapse.) She noticed more "bubbles" in the urine 3-4 months.  She "feels"/hears them as she voids, doesn't actually see any bubbles or abnormality in the urine itself. No dysuria, hematuria. She notices this mostly mid-day, usually at the end (when getting the last  bit of urine out).   Health maintenance: She had a colonoscopy --can't recall date ('05 or '07, Dr. Oletta Lamas). She hasn't received any notification yet. GYN--scheduled for October, had mammo in November 2016. She had Hepatitis C screen through GYN and was negative. LMP 2010.  She reports her [place] showed menopause.   PMH, PSH, SH  reviewed  Outpatient Encounter Prescriptions as of 03/12/2016  Medication Sig Note  . acetaminophen (TYLENOL) 650 MG CR tablet Take 1,300 mg by mouth every 8 (eight) hours as needed for pain. 03/12/2016: Uses prn   . ALPRAZolam (XANAX) 0.5 MG tablet Take 0.5-1 tablets (0.25-0.5 mg total) by mouth 3 (three) times daily as needed for anxiety. 03/12/2016: Uses 1/2 tablet 1-2x/week prn anxiety.  Used full tablet yesterday, increased stress  . atorvastatin (LIPITOR) 40 MG tablet Take 1 tablet (40 mg total) by mouth daily.   Marland Kitchen buPROPion (WELLBUTRIN XL) 300 MG 24 hr tablet Take 1 tablet (300 mg total) by mouth daily.   . Calcium Carbonate-Vitamin D (CALCIUM + D) 600-200 MG-UNIT TABS Take 2 tablets by mouth daily.     . cetirizine (ZYRTEC) 10 MG tablet Take 10 mg by mouth daily.     . citalopram (CELEXA) 40 MG tablet take 1 tablet by mouth once daily   . cyclobenzaprine (FLEXERIL) 5 MG tablet as needed. Reported on 09/02/2015 02/14/2014: Takes prn back pain (last rx'd by Dr. Leo Grosser; also helps with endometriosis pain)  . diclofenac (VOLTAREN) 75 MG EC tablet take 1 tablet by mouth twice a day 03/12/2016: Uses just once daily  . fish oil-omega-3 fatty acids 1000 MG capsule Take 3 g by mouth daily.    . fluticasone (FLONASE) 50 MCG/ACT nasal spray INSTILL 1 TO 2 SPRAYS INTO EACH NOSTRIL ONCE A DAY   . hydrochlorothiazide (HYDRODIURIL) 25 MG tablet Take 1 tablet (25 mg total) by mouth daily.   Marland Kitchen losartan (COZAAR) 25 MG tablet take 1 tablet by mouth once daily   . medroxyPROGESTERone (PROVERA) 10 MG tablet Take 10 mg by mouth daily.   . metFORMIN (GLUCOPHAGE-XR) 500 MG 24 hr tablet take 2 tablets by mouth every morning and 1 tablet every evening   . mometasone-formoterol (DULERA) 100-5 MCG/ACT AERO Inhale 2 puffs into the lungs 2 (two) times daily.   . Multiple Vitamins-Minerals (MULTIVITAMIN WITH MINERALS) tablet Take 1 tablet by mouth daily.     Marland Kitchen omeprazole (PRILOSEC) 20 MG capsule Take 20 mg by mouth daily.      . vitamin B-12 (CYANOCOBALAMIN) 1000 MCG tablet Take 1,000 mcg by mouth daily.     . vitamin C (ASCORBIC ACID) 500 MG tablet Take 500 mg by mouth daily.   . [DISCONTINUED] HYDROcodone-acetaminophen (NORCO/VICODIN) 5-325 MG tablet take 1 to 2 tablets by mouth every 6 hours if needed for pain 09/02/2015: Uses 2/week (Dr. Lynann Bologna)   No facility-administered encounter medications on file as of 03/12/2016.    ROS: no fever, chills, headaches, dizziness, chest pain, shortness of breath, URI symptoms.  Tingling and stress/anxiety as per HPI.  No nausea, vomiting, heartburn, bowel changes.  +urinary complaints as per HPI, no dysuria, incontinence.  Moods okay, no depression, just stress. See HPI  PHYSICAL EXAM:  BP 122/70 (BP Location: Left Arm, Patient Position: Sitting, Cuff Size: Normal)   Pulse 80   Temp 99 F (37.2 C) (Tympanic)   Ht 5' 4"  (1.626 m)   Wt 261 lb (118.4 kg)   LMP 02/09/2009 (Exact Date)   BMI 44.80 kg/m  Well developed, pleasant, morbidly obese female in no distress HEENT:  EOMI, conjunctiva clear. Neck: no lymphadenopathy or thyromegaly, no carotid bruit Heart: regular rate and rhythm, no murmur Lungs: clear bilaterally, no wheezes Abdomen: obese, soft, nontender, no mass, no organomegaly  Back: no spinal or CVA tendeness Extremities: normal pulses, no edema.  Skin: no suspicious lesions, bruising, normal turgor Neuro: alert and oriented, normal gait, cranial nerves Psych: normal mood, affect, hygiene and grooming  Lab Results  Component Value Date   HGBA1C 5.7 03/12/2016   Urine dip normal.  ASSESSMENT/PLAN:  Essential hypertension - controlled; continue current regimen - Plan: Comprehensive metabolic panel, hydrochlorothiazide (HYDRODIURIL) 25 MG tablet, losartan (COZAAR) 25 MG tablet  Morbid obesity with body mass index of 45.0-49.9 in adult [facility]) - counseled re: diet, exercise, weight loss  Insulin resistance syndrome - continue metformin; weight loss  and increased exercise encouraged - Plan: HgB A1c, metFORMIN (GLUCOPHAGE-XR) 500 MG 24 hr tablet  Mixed hyperlipidemia - Plan: Lipid panel, atorvastatin (LIPITOR) 40 MG tablet  Osteoarthritis of knee, unspecified laterality, unspecified osteoarthritis type - improved s/p surgery - Plan: diclofenac (VOLTAREN) 75 MG EC tablet  Abnormal urine - follow up with GYN as planned.  reassured no evidence of infection or protein in the urine - Plan: POCT Urinalysis Dipstick  Depression, major, in remission ([place]) - doing well on current regimen - Plan: buPROPion (WELLBUTRIN XL) 300 MG 24 hr tablet, citalopram (CELEXA) 40 MG tablet  Anxiety state - increased stressors.  Counseled re: stress reduction, rather than using food, alcohol to feel better.  Use alprazolam prn (not recommended for nightly use) - Plan: ALPRAZolam (XANAX) 0.5 MG tablet, citalopram (CELEXA) 40 MG tablet  Medication monitoring encounter - Plan: Comprehensive metabolic panel   Please check with [facility] re: colonoscopy--you are likely due for routine screening.   F/u 6 months

## 2016-03-12 ENCOUNTER — Ambulatory Visit (INDEPENDENT_AMBULATORY_CARE_PROVIDER_SITE_OTHER): Payer: BLUE CROSS/BLUE SHIELD | Admitting: Family Medicine

## 2016-03-12 VITALS — BP 122/70 | HR 80 | Temp 99.0°F | Ht 64.0 in | Wt 261.0 lb

## 2016-03-12 DIAGNOSIS — I10 Essential (primary) hypertension: Secondary | ICD-10-CM

## 2016-03-12 DIAGNOSIS — M179 Osteoarthritis of knee, unspecified: Secondary | ICD-10-CM

## 2016-03-12 DIAGNOSIS — E782 Mixed hyperlipidemia: Secondary | ICD-10-CM

## 2016-03-12 DIAGNOSIS — R8290 Unspecified abnormal findings in urine: Secondary | ICD-10-CM | POA: Diagnosis not present

## 2016-03-12 DIAGNOSIS — F411 Generalized anxiety disorder: Secondary | ICD-10-CM

## 2016-03-12 DIAGNOSIS — E8881 Metabolic syndrome: Secondary | ICD-10-CM

## 2016-03-12 DIAGNOSIS — M1710 Unilateral primary osteoarthritis, unspecified knee: Secondary | ICD-10-CM

## 2016-03-12 DIAGNOSIS — F325 Major depressive disorder, single episode, in full remission: Secondary | ICD-10-CM | POA: Diagnosis not present

## 2016-03-12 DIAGNOSIS — Z6841 Body Mass Index (BMI) 40.0 and over, adult: Secondary | ICD-10-CM | POA: Diagnosis not present

## 2016-03-12 DIAGNOSIS — Z5181 Encounter for therapeutic drug level monitoring: Secondary | ICD-10-CM

## 2016-03-12 LAB — LIPID PANEL
CHOL/HDL RATIO: 2.9 ratio (ref <=5.0)
Cholesterol: 167 mg/dL (ref 125–200)
HDL: 58 mg/dL (ref >=46)
LDL CALC: 81 mg/dL (ref <130)
Triglycerides: 141 mg/dL (ref <150)
VLDL: 28 mg/dL (ref <30)

## 2016-03-12 LAB — COMPREHENSIVE METABOLIC PANEL
ALBUMIN: 4.4 g/dL (ref 3.6–5.1)
ALK PHOS: 73 U/L (ref 33–130)
ALT: 38 U/L — AB (ref 6–29)
AST: 39 U/L — AB (ref 10–35)
BILIRUBIN TOTAL: 0.5 mg/dL (ref 0.2–1.2)
BUN: 20 mg/dL (ref 7–25)
CALCIUM: 9.8 mg/dL (ref 8.6–10.4)
CO2: 26 mmol/L (ref 20–31)
Chloride: 105 mmol/L (ref 98–110)
Creat: 1.05 mg/dL (ref 0.50–1.05)
GLUCOSE: 95 mg/dL (ref 65–99)
Potassium: 3.9 mmol/L (ref 3.5–5.3)
Sodium: 141 mmol/L (ref 135–146)
TOTAL PROTEIN: 7.3 g/dL (ref 6.1–8.1)

## 2016-03-12 LAB — POCT URINALYSIS DIPSTICK
BILIRUBIN UA: NEGATIVE
Blood, UA: NEGATIVE
GLUCOSE UA: NEGATIVE
Ketones, UA: NEGATIVE
LEUKOCYTES UA: NEGATIVE
Nitrite, UA: NEGATIVE
PH UA: 6
Protein, UA: NEGATIVE
Spec Grav, UA: 1.025
Urobilinogen, UA: NEGATIVE

## 2016-03-12 LAB — POCT GLYCOSYLATED HEMOGLOBIN (HGB A1C): HEMOGLOBIN A1C: 5.7

## 2016-03-12 MED ORDER — HYDROCHLOROTHIAZIDE 25 MG PO TABS: 25.0000 mg | ORAL_TABLET | Freq: Every day | ORAL | 5 refills | Status: DC

## 2016-03-12 MED ORDER — METFORMIN HCL ER 500 MG PO TB24: ORAL_TABLET | ORAL | 5 refills | Status: DC

## 2016-03-12 MED ORDER — CITALOPRAM HYDROBROMIDE 40 MG PO TABS: ORAL_TABLET | ORAL | 5 refills | Status: DC

## 2016-03-12 MED ORDER — LOSARTAN POTASSIUM 25 MG PO TABS: 25.0000 mg | ORAL_TABLET | Freq: Every day | ORAL | 5 refills | Status: DC

## 2016-03-12 MED ORDER — ATORVASTATIN CALCIUM 40 MG PO TABS: 40.0000 mg | ORAL_TABLET | Freq: Every day | ORAL | 5 refills | Status: DC

## 2016-03-12 MED ORDER — ALPRAZOLAM 0.5 MG PO TABS: 0.2500 mg | ORAL_TABLET | Freq: Three times a day (TID) | ORAL | 0 refills | Status: DC | PRN

## 2016-03-12 MED ORDER — DICLOFENAC SODIUM 75 MG PO TBEC: 75.0000 mg | DELAYED_RELEASE_TABLET | Freq: Two times a day (BID) | ORAL | 2 refills | Status: DC

## 2016-03-12 MED ORDER — BUPROPION HCL ER (XL) 300 MG PO TB24: 300.0000 mg | ORAL_TABLET | Freq: Every day | ORAL | 5 refills | Status: DC

## 2016-03-12 NOTE — Patient Instructions (Addendum)
Let's work on stress reduction techniques--use the alprazolam sparingly, as needed for anxiety. Try and use exercise and other relaxation techniques as first line.  Consider ways to hold yourself more accountable for diet and exercise (ie restart [employer]itness[employer] vs other).   Please check with [facility] re: colonoscopy--you are likely due for routine screening.   Continue your current medications.

## 2016-03-16 DIAGNOSIS — Z96652 Presence of left artificial knee joint: Secondary | ICD-10-CM | POA: Diagnosis not present

## 2016-03-30 DIAGNOSIS — M654 Radial styloid tenosynovitis [de Quervain]: Secondary | ICD-10-CM | POA: Diagnosis not present

## 2016-04-14 ENCOUNTER — Encounter: Payer: Self-pay | Admitting: Family Medicine

## 2016-04-22 NOTE — Progress Notes (Signed)
Chief Complaint  Patient presents with  . Weight Loss    would like to try Qsymia, would like to discuss today.     Patient presents to discuss Qsymia for weight loss. She had sent emails asking about Contrave--see messages; felt she would be best candidate for Qsymia, rather than belviq or contrave due to her wellbutrin and citalopram which could increase the risk of seratonin syndrome. She had only asked about Contrave due to a friend starting it and seeing great results.  She reports being "extremely down on herself" over the last 4-5 months.  A lot of stress at work (which has gotten a little better)--working overtime, not able to get to the gym. Admits to self-destructive behavior--eating, drinking alcohol daily. She would like to try weight loss medication to help jumpstart her weight loss, decrease appetite, avoid the destructive behaviors.  She continues to take her same anti-depressants.  Counseling had been suggested in the past--she is now considering this (never called).  PMH, PSH, SH reviewed/updated  Outpatient Encounter Prescriptions as of 04/23/2016  Medication Sig Note  . acetaminophen (TYLENOL) 650 MG CR tablet Take 1,300 mg by mouth every 8 (eight) hours as needed for pain. 03/12/2016: Uses prn   . ALPRAZolam (XANAX) 0.5 MG tablet Take 0.5-1 tablets (0.25-0.5 mg total) by mouth 3 (three) times daily as needed for anxiety.   Marland Kitchen atorvastatin (LIPITOR) 40 MG tablet Take 1 tablet (40 mg total) by mouth daily.   Marland Kitchen buPROPion (WELLBUTRIN XL) 300 MG 24 hr tablet Take 1 tablet (300 mg total) by mouth daily.   . Calcium Carbonate-Vitamin D (CALCIUM + D) 600-200 MG-UNIT TABS Take 2 tablets by mouth daily.     . cetirizine (ZYRTEC) 10 MG tablet Take 10 mg by mouth daily.     . citalopram (CELEXA) 40 MG tablet take 1 tablet by mouth once daily   . cyclobenzaprine (FLEXERIL) 5 MG tablet as needed. Reported on 09/02/2015 02/14/2014: Takes prn back pain (last rx'd by Dr. Leo Grosser; also helps with  endometriosis pain)  . diclofenac (VOLTAREN) 75 MG EC tablet Take 1 tablet (75 mg total) by mouth 2 (two) times daily.   . Diclofenac Sodium (PENNSAID) 1.5 % SOLN Place 1 each onto the skin 2 (two) times daily.   . fish oil-omega-3 fatty acids 1000 MG capsule Take 3 g by mouth daily.    . fluticasone (FLONASE) 50 MCG/ACT nasal spray INSTILL 1 TO 2 SPRAYS INTO EACH NOSTRIL ONCE A DAY   . hydrochlorothiazide (HYDRODIURIL) 25 MG tablet Take 1 tablet (25 mg total) by mouth daily.   Marland Kitchen losartan (COZAAR) 25 MG tablet Take 1 tablet (25 mg total) by mouth daily.   . medroxyPROGESTERone (PROVERA) 10 MG tablet Take 10 mg by mouth daily.   . metFORMIN (GLUCOPHAGE-XR) 500 MG 24 hr tablet take 2 tablets by mouth every morning and 1 tablet every evening   . mometasone-formoterol (DULERA) 100-5 MCG/ACT AERO Inhale 2 puffs into the lungs 2 (two) times daily.   . Multiple Vitamins-Minerals (MULTIVITAMIN WITH MINERALS) tablet Take 1 tablet by mouth daily.     Marland Kitchen omeprazole (PRILOSEC) 20 MG capsule Take 20 mg by mouth daily.     . vitamin B-12 (CYANOCOBALAMIN) 1000 MCG tablet Take 1,000 mcg by mouth daily.     . vitamin C (ASCORBIC ACID) 500 MG tablet Take 500 mg by mouth daily.   . Phentermine-Topiramate 3.75-23 MG CP24 Take 1 capsule by mouth daily.   . Phentermine-Topiramate 7.5-46 MG CP24  Take 1 capsule by mouth daily.    No facility-administered encounter medications on file as of 04/23/2016.    (Qsymia rx'd today, not prior to visit).  Allergies  Allergen Reactions  . Meloxicam Itching    ROS:  No headaches, dizziness, chest pain, palpitations, GI complaints.  Denies suicidal or homicidal ideation.  +depression.  No insomnia.  See HPI.  PHYSICAL EXAM:  BP 138/80 (BP Location: Left Arm, Patient Position: Sitting, Cuff Size: Normal)   Pulse 100   Ht 5' 4"  (1.626 m)   Wt 270 lb (122.5 kg)   BMI 46.35 kg/m   Tearful in discussing her moods, behavior. Full range of affect Normal eye contact,  speech, hygiene and grooming Alert and oriented, normal strength, gait  ASSESSMENT/PLAN:  Class 3 obesity due to excess calories with serious comorbidity and body mass index (BMI) of 45.0 to 49.9 in adult ([place]) - Plan: Phentermine-Topiramate 3.75-23 MG CP24, Phentermine-Topiramate 7.5-46 MG CP24  Essential hypertension  Insulin resistance syndrome  Mixed hyperlipidemia  Recurrent major depressive disorder, in partial remission ([place]) - previously in remission, not as well controlled at the present time. Continue current meds; encouraged counseling.    More motivated to consider counseling, and trial of medications to help with weight loss.  Risks/side effects of meds reviewed in detail. Given savings card--to get starting dose x 2 weeks free, and discount on copay for prescriptions.  Given 4 weeks of 7.7m dose, and pt to fu/ in 5-6 weeks to assess efficacy.  If tolerating, and has lost at least 3%, then can further titrate up.  If <3% weight loss, will discontinue. She was asking about using phentermine and topiramate separately, as both generic, for cost saving.  Discussed that starting doses are much lower, phentermine is capsule and can't be split (at the lower doses)  We can consider this in future when on the higher doses, if tolerating, but needs to use the Qsymia to titrate up to those doses. Counseled extensively re: possible side effects, including SI from the topiramate, and to contact [place] having problems.  F/u 5-6 weeks, sooner prn. Encouraged to set up counseling with JMarya AmslerBriefly also mentioned overeater's anonymous. Now that work schedule is less, try and get more regular exercise. Discussed other stress reduction techniques to take the place of alcohol--consider nightly yoga, meditation.  30 min visit, more than 1/2 spent counseling.

## 2016-04-23 ENCOUNTER — Encounter: Payer: Self-pay | Admitting: Family Medicine

## 2016-04-23 ENCOUNTER — Ambulatory Visit (INDEPENDENT_AMBULATORY_CARE_PROVIDER_SITE_OTHER): Payer: BLUE CROSS/BLUE SHIELD | Admitting: Family Medicine

## 2016-04-23 VITALS — BP 138/80 | HR 100 | Ht 64.0 in | Wt 270.0 lb

## 2016-04-23 DIAGNOSIS — E6609 Other obesity due to excess calories: Secondary | ICD-10-CM | POA: Insufficient documentation

## 2016-04-23 DIAGNOSIS — E8881 Metabolic syndrome: Secondary | ICD-10-CM

## 2016-04-23 DIAGNOSIS — I10 Essential (primary) hypertension: Secondary | ICD-10-CM

## 2016-04-23 DIAGNOSIS — IMO0001 Reserved for inherently not codable concepts without codable children: Secondary | ICD-10-CM

## 2016-04-23 DIAGNOSIS — E782 Mixed hyperlipidemia: Secondary | ICD-10-CM | POA: Diagnosis not present

## 2016-04-23 DIAGNOSIS — F3341 Major depressive disorder, recurrent, in partial remission: Secondary | ICD-10-CM | POA: Diagnosis not present

## 2016-04-23 DIAGNOSIS — Z6841 Body Mass Index (BMI) 40.0 and over, adult: Secondary | ICD-10-CM | POA: Diagnosis not present

## 2016-04-23 MED ORDER — PHENTERMINE-TOPIRAMATE ER 7.5-46 MG PO CP24: 1.0000 | ORAL_CAPSULE | Freq: Every day | ORAL | 0 refills | Status: DC

## 2016-04-23 MED ORDER — PHENTERMINE-TOPIRAMATE ER 3.75-23 MG PO CP24: 1.0000 | ORAL_CAPSULE | Freq: Every day | ORAL | 0 refills | Status: DC

## 2016-04-23 NOTE — Patient Instructions (Signed)
Start the lowest dose Qsymia for 2 weeks.  If tolerating without side effects, then increase to the next dose (7.5).  Schedule office visit for 6 weeks, to assess how well it is working, and possible further titration of the dose.  I encourage you to seek counseling to help with your moods. Continue your current anti-depressants.  Consider yoga or other relaxation techniques in the evening (instead of the vodka).  When your time frees up, try and get back to regular exercise--both for the cardiovascular benefit, burning calories for weight loss, but more importantly, helping with moods and stress reduction.

## 2016-04-27 ENCOUNTER — Other Ambulatory Visit: Payer: Self-pay | Admitting: Obstetrics and Gynecology

## 2016-04-27 DIAGNOSIS — Z1231 Encounter for screening mammogram for malignant neoplasm of breast: Secondary | ICD-10-CM

## 2016-04-28 DIAGNOSIS — N951 Menopausal and female climacteric states: Secondary | ICD-10-CM | POA: Diagnosis not present

## 2016-04-28 DIAGNOSIS — L293 Anogenital pruritus, unspecified: Secondary | ICD-10-CM | POA: Diagnosis not present

## 2016-04-28 DIAGNOSIS — B372 Candidiasis of skin and nail: Secondary | ICD-10-CM | POA: Diagnosis not present

## 2016-04-28 DIAGNOSIS — N809 Endometriosis, unspecified: Secondary | ICD-10-CM | POA: Diagnosis not present

## 2016-04-28 DIAGNOSIS — Z01419 Encounter for gynecological examination (general) (routine) without abnormal findings: Secondary | ICD-10-CM | POA: Diagnosis not present

## 2016-05-09 NOTE — Assessment & Plan Note (Signed)
Download confirms excellent compliance and control. She is comfortable continuing AutoPap 5-20 as discussed.

## 2016-05-09 NOTE — Assessment & Plan Note (Signed)
She has not been able to change lifestyle enough to accomplish weight loss would help her. Consider bariatric referral.

## 2016-05-14 ENCOUNTER — Encounter: Payer: Self-pay | Admitting: Internal Medicine

## 2016-05-20 ENCOUNTER — Ambulatory Visit
Admission: RE | Admit: 2016-05-20 | Discharge: 2016-05-20 | Disposition: A | Discharge status: Home / Self Care | Payer: BLUE CROSS/BLUE SHIELD | Source: Ambulatory Visit | Attending: Obstetrics and Gynecology | Admitting: Obstetrics and Gynecology

## 2016-05-20 DIAGNOSIS — Z1231 Encounter for screening mammogram for malignant neoplasm of breast: Secondary | ICD-10-CM | POA: Diagnosis not present

## 2016-06-02 ENCOUNTER — Ambulatory Visit (INDEPENDENT_AMBULATORY_CARE_PROVIDER_SITE_OTHER): Payer: BLUE CROSS/BLUE SHIELD | Admitting: Internal Medicine

## 2016-06-02 ENCOUNTER — Encounter: Payer: Self-pay | Admitting: Internal Medicine

## 2016-06-02 VITALS — BP 128/80 | HR 86 | Ht 64.0 in | Wt 261.0 lb

## 2016-06-02 DIAGNOSIS — J4541 Moderate persistent asthma with (acute) exacerbation: Secondary | ICD-10-CM | POA: Insufficient documentation

## 2016-06-02 MED ORDER — PREDNISONE 10 MG PO TABS: ORAL_TABLET | ORAL | 0 refills | Status: DC

## 2016-06-02 MED ORDER — MOMETASONE FURO-FORMOTEROL FUM 100-5 MCG/ACT IN AERO: 2.0000 | INHALATION_SPRAY | Freq: Two times a day (BID) | RESPIRATORY_TRACT | 6 refills | Status: DC

## 2016-06-02 NOTE — Addendum Note (Signed)
Addended by: Collier Salina on: 06/02/2016 11:19 AM   Modules accepted: Orders

## 2016-06-02 NOTE — Progress Notes (Signed)
Subjective:     Patient ID: Susan Gould, female   DOB: 04-28-1966, 50 y.o.   MRN: [id number]  HPI OV 05/10/2015  Chief Complaint  Patient presents with  . Follow-up    Pt states her breathing is doing well. Pt c/o mild prod cough. Pt states overall she does not have any complaints.    50 year old obese  patient with asthma . She used tpo be followed by Dr Susan Gould for asthma.  Now transfer of care to Dr Susan Gould  - Reports life loing hx of asthma. Moved from [place] to [place] in the 80s. Last asthma exac > 25 years ago. Does not remoember last prednisone. This is routine 6 month fu. Last visit April 2016. Since then compliant as always with dulera 2 puff bid. Never used alb rescue. ACQ score is 0 out of 5 showing grood control Exhaled NO is 32 and above 25 and so slightly high but in grey zone.   - Specifically she does not wake up at night because of asthma. When she wakes up she is asymptomatic. She is not limited in her activities in the daytime because of asthma. She does not have any shortness of breath because of asthma. No wheezing because of asthma. No albuterol rescue use. Review of medications shows she is on ACE inhibitor but she does not have a cough  OV 03/03/2016  Chief Complaint  Patient presents with  . Follow-up    Says she is doing better, no coughing or sob or chest pain   FU mod persistent asthma  - doing well. Had knee surgery without problems. Asthma control questionnaire shows excellent control with score of 0 out of 5. She does not wake up in the middle of the night with asthma symptoms. When she wakes up she does not have any asthma symptoms. Activities are not limited because of asthma. She does not expands any shortness of breath or wheezing because of asthma. She only uses her Dulera one puff once daily and she uses it 2 times a month for rescue. However she is hesitant to down scale to inhaled steroid alone because she has several supply samples of Dulera. She has  not had flu shot but will have it today.   OV 06/02/2016  Chief Complaint  Patient presents with  . Follow-up    Pt c/o sinus congestion and dry cough, productive cough  in mornings with yellow mucus. Pt c/o slight increase in SOB. Pt denies CP/tightness and f/c/s.     50 year old female with moderate persistent asthma  3 month follow-up. At this visit he was supposed to be escalate asthma therapy given her continued good health. However she is reporting for the last 1 month because of the weather change in the fall season she's having increased sinus drainage and increased cough and shortness of breath but not necessarily increased wheezing or albuterol rescue use or sputum or fever. She does have increased chest tightness. The [facility] seems to help. However she is only using 1 puff twice daily. She is in agreement for some prednisone therapy and escalating her maintenance Dulera dosage   Asthma Control Panel - normal spiro feb 2015 -IgE - none done - cxr 2001 - clear - positive allergy testing by history greater than 25 years ago - eos 200cells march2017 05/10/2015  03/03/2016   Current Med Regimen dulera dulera - takes it 1 puff daily + prn 2 times a month  ACQ 5 point- 1 week. wtd avg score. <  1.0 is good control 0.75-1.25 is grey zone. >1.25 poor control. Delta 0.5 is clinically meaningful ZERO 0  FeNO ppB 32ppb   FeV1  x x  Planned intervention  for visit contininue dulera, change ace to arb Cont dulera 1 puff twice daily     Review of Systems     Objective:   Physical Exam  Constitutional: She is oriented to person, place, and time. She appears well-developed and well-nourished. No distress.  HENT:  Head: Normocephalic and atraumatic.  Right Ear: External ear normal.  Left Ear: External ear normal.  Mouth/Throat: Oropharynx is clear and moist. No oropharyngeal exudate.  Eyes: Conjunctivae and EOM are normal. Pupils are equal, round, and reactive to light. Right eye  exhibits no discharge. Left eye exhibits no discharge. No scleral icterus.  Neck: Normal range of motion. Neck supple. No JVD present. No tracheal deviation present. No thyromegaly present.  Cardiovascular: Normal rate, regular rhythm, normal heart sounds and intact distal pulses.  Exam reveals no gallop and no friction rub.   No murmur heard. Pulmonary/Chest: Effort normal and breath sounds normal. No respiratory distress. She has no wheezes. She has no rales. She exhibits no tenderness.  Abdominal: Soft. Bowel sounds are normal. She exhibits no distension and no mass. There is no tenderness. There is no rebound and no guarding.  Musculoskeletal: Normal range of motion. She exhibits no edema or tenderness.  Lymphadenopathy:    She has no cervical adenopathy.  Neurological: She is alert and oriented to person, place, and time. She has normal reflexes. No cranial nerve deficit. She exhibits normal muscle tone. Coordination normal.  Skin: Skin is warm and dry. No rash noted. She is not diaphoretic. No erythema. No pallor.  Psychiatric: She has a normal mood and affect. Her behavior is normal. Judgment and thought content normal.  Vitals reviewed.    Vitals:   06/02/16 1051  BP: 128/80  Pulse: 86  SpO2: 97%  Weight: 261 lb (118.4 kg)  Height: 5' 4"  (1.626 m)    Estimated body mass index is 44.8 kg/m as calculated from the following:   Height as of this encounter: 5' 4"  (1.626 m).   Weight as of this encounter: 261 lb (118.4 kg).      Assessment:       ICD-9-CM ICD-10-CM   1. Acute exacerbation of moderate persistent extrinsic asthma 493.02 J45.41        Plan:      Flare up due to sinus drainage and fall weahter  Plan - Take prednisone 40 mg daily x 2 days, then 41m daily x 2 days, then 116mdaily x 2 days, then 7m33maily x 2 days and stop - given flare up doubt we can descalate asthma inhaler Rx. IN fact, we have to escalate - Continue Dulera but increase to 2 puff twice  daily from one puff twice daily - Use albuterol as needed  Follow-up - 6 months or sooner if needed  - At follow-up ACQ and feno test  Dr. MurBrand Gould.D., F.CChi [facility] and Critical [facility] Medicine Staff Physician [place] Pager: [phone] no answer or between  15:00h - 7:00h: call [phone]  06/02/2016 11:13 AM

## 2016-06-02 NOTE — Patient Instructions (Addendum)
    ICD-9-CM ICD-10-CM   1. Acute exacerbation of moderate persistent extrinsic asthma 493.02 J45.41    Flare up due to sinus drainage and fall weahter  Plan - Take prednisone 40 mg daily x 2 days, then 70m daily x 2 days, then 166mdaily x 2 days, then 47m97maily x 2 days and stop - given flare up doubt we can descalate asthma inhaler Rx. IN fact, we have to escalate - Continue Dulera but increase to 2 puff twice daily from one puff twice daily - Use albuterol as needed  Follow-up - 6 months or sooner if needed  - At follow-up ACQ and feno testing

## 2016-06-02 NOTE — Addendum Note (Signed)
Addended by: Collier Salina on: 06/02/2016 11:21 AM   Modules accepted: Orders

## 2016-06-04 ENCOUNTER — Encounter: Payer: Self-pay | Admitting: Family Medicine

## 2016-06-04 ENCOUNTER — Ambulatory Visit (INDEPENDENT_AMBULATORY_CARE_PROVIDER_SITE_OTHER): Payer: BLUE CROSS/BLUE SHIELD | Admitting: Family Medicine

## 2016-06-04 VITALS — BP 122/80 | HR 88 | Ht 64.0 in | Wt 261.0 lb

## 2016-06-04 DIAGNOSIS — Z6841 Body Mass Index (BMI) 40.0 and over, adult: Secondary | ICD-10-CM

## 2016-06-04 DIAGNOSIS — F3341 Major depressive disorder, recurrent, in partial remission: Secondary | ICD-10-CM

## 2016-06-04 DIAGNOSIS — E6609 Other obesity due to excess calories: Secondary | ICD-10-CM | POA: Diagnosis not present

## 2016-06-04 DIAGNOSIS — I10 Essential (primary) hypertension: Secondary | ICD-10-CM | POA: Diagnosis not present

## 2016-06-04 DIAGNOSIS — IMO0001 Reserved for inherently not codable concepts without codable children: Secondary | ICD-10-CM

## 2016-06-04 MED ORDER — PHENTERMINE-TOPIRAMATE ER 11.25-69 MG PO CP24: 1.0000 | ORAL_CAPSULE | Freq: Every day | ORAL | 1 refills | Status: DC

## 2016-06-04 NOTE — Patient Instructions (Addendum)
Complete your current prescription of Qsymia.  Then go to the next higher dose.  Contact us if you have any issues with tolerability or side effects.  Please contact us if you have worsening of moods or suicidal thoughts. Consider counseling. Return in 2 months, sooner if needed. Continue healthier diet, portion control and regular exercise. Contact us when you need alprazolam refill. Continue to use sparingly, and use other techniques as well to try and help stay relaxed, sleep, etc.

## 2016-06-04 NOTE — Progress Notes (Signed)
Chief Complaint  Patient presents with  . Follow-up    on weightloss. Down 9lbs.    She has been taking Qsymia for weight loss. She notes decreased appetite. She no longer finishes the food on her plate if she is full, and has started giving smaller portions. She denies any significant side effects.  She had a significant stressor develop since last visit--a romantic relationship recently ended, suddenly and unexpectedly. She found out through [place] that a longstanding "friend with benefits" was seeing someone else. She is extremely hurt by this, and spoke to him just once about it. She has supportive friends who have been helping her through this. Moods overall depend on the day--some crying, but handling things better. No longer drinking alcohol. Using alprazolam some at night since this occurred--used 2 pills together for the first couple of nights after break-up, then cut it down to 1.  Taking it 5/7 days/week. Her friend Kathlee Nations has been acting as her Social worker.  Still "thinking about" seeing therapist. Exercising more, eating better. Trying to be proactive instead of reactive. Last filled alprazolam #20 on 9/14.  Has about 10 left.  PMH, PSH, SH reviewed  Outpatient Encounter Prescriptions as of 06/04/2016  Medication Sig Note  . ALPRAZolam (XANAX) 0.5 MG tablet Take 0.5-1 tablets (0.25-0.5 mg total) by mouth 3 (three) times daily as needed for anxiety.   Marland Kitchen atorvastatin (LIPITOR) 40 MG tablet Take 1 tablet (40 mg total) by mouth daily.   Marland Kitchen buPROPion (WELLBUTRIN XL) 300 MG 24 hr tablet Take 1 tablet (300 mg total) by mouth daily.   . Calcium Carbonate-Vitamin D (CALCIUM + D) 600-200 MG-UNIT TABS Take 2 tablets by mouth daily.     . cetirizine (ZYRTEC) 10 MG tablet Take 10 mg by mouth daily.     . citalopram (CELEXA) 40 MG tablet take 1 tablet by mouth once daily   . cyclobenzaprine (FLEXERIL) 5 MG tablet as needed. Reported on 09/02/2015 02/14/2014: Takes prn back pain (last rx'd by Dr.  Leo Grosser; also helps with endometriosis pain)  . diclofenac (VOLTAREN) 75 MG EC tablet Take 1 tablet (75 mg total) by mouth 2 (two) times daily.   . Diclofenac Sodium (PENNSAID) 1.5 % SOLN Place 1 each onto the skin 2 (two) times daily.   . fish oil-omega-3 fatty acids 1000 MG capsule Take 3 g by mouth daily.    . fluticasone (FLONASE) 50 MCG/ACT nasal spray INSTILL 1 TO 2 SPRAYS INTO EACH NOSTRIL ONCE A DAY   . hydrochlorothiazide (HYDRODIURIL) 25 MG tablet Take 1 tablet (25 mg total) by mouth daily.   Marland Kitchen losartan (COZAAR) 25 MG tablet Take 1 tablet (25 mg total) by mouth daily.   . metFORMIN (GLUCOPHAGE-XR) 500 MG 24 hr tablet take 2 tablets by mouth every morning and 1 tablet every evening   . mometasone-formoterol (DULERA) 100-5 MCG/ACT AERO Inhale 2 puffs into the lungs 2 (two) times daily.   . Multiple Vitamins-Minerals (MULTIVITAMIN WITH MINERALS) tablet Take 1 tablet by mouth daily.     Marland Kitchen omeprazole (PRILOSEC) 20 MG capsule Take 20 mg by mouth daily.     . predniSONE (DELTASONE) 10 MG tablet 40 mg daily x 2 days, then 69m daily x 2 days, then 167mdaily x 2 days, then 60m33maily x 2 days and stop   . vitamin B-12 (CYANOCOBALAMIN) 1000 MCG tablet Take 1,000 mcg by mouth daily.     . vitamin C (ASCORBIC ACID) 500 MG tablet Take 500 mg by mouth  daily.   . [DISCONTINUED] Phentermine-Topiramate 7.5-46 MG CP24 Take 1 capsule by mouth daily.   . Phentermine-Topiramate (QSYMIA) 11.25-69 MG CP24 Take 1 capsule by mouth daily.   . [DISCONTINUED] acetaminophen (TYLENOL) 650 MG CR tablet Take 1,300 mg by mouth every 8 (eight) hours as needed for pain. 03/12/2016: Uses prn    No facility-administered encounter medications on file as of 06/04/2016.    Currently taking phentermine 7.28m dose  Allergies  Allergen Reactions  . Meloxicam Itching   ROS:  No fever, chills, asthma/breathing at baseline. No URI symptoms.  Denies insomnia--took xanax to help prevent dreams about her boyfriend. +depression,  denies SI, contracts for safety. No chest pain, palpitations. See HPI  PHYSICAL EXAM:  BP 122/80 (BP Location: Left Arm, Patient Position: Sitting, Cuff Size: Normal)   Pulse 88   Ht 5' 4"  (1.626 m)   Wt 261 lb (118.4 kg)   LMP 01/27/2009 (Exact Date)   BMI 44.80 kg/m   Well appearing female intermittently tearful in discussing the recent breakup. She has good eye contact, full range of affect, normal hygiene and grooming  ASSESSMENT/PLAN:  Essential hypertension - well controlled  Class 3 obesity due to excess calories with serious comorbidity and body mass index (BMI) of 45.0 to 49.9 in adult ([facility] - titrate up Qsymia to 11.268mdose.  Risks/side effects reviewed - Plan: Phentermine-Topiramate (QSYMIA) 11.25-69 MG CP24  Recurrent major depressive disorder, in partial remission ([place]- some additional adjustment d/o with depression related to break-up. Continue current meds. Counseled  Anxiety/depression/intermittent insomnia--Continue current meds. Be on the lookout for worsening moods or SI related to increased dose of topiramate and decrease dose back if develops.  Counseled re: sleep hygiene, relaxation techniques. Encouraged counseling if not improving.  Doing well as far as weight loss on Qsymia--further titrate up the dose, since doing well without side effects at this point. F/u 2 months  Call when alprazolam refill is needed.  30 min visit, more than 1/2 spent counseling.

## 2016-06-25 ENCOUNTER — Other Ambulatory Visit: Payer: Self-pay | Admitting: Family Medicine

## 2016-06-25 DIAGNOSIS — F411 Generalized anxiety disorder: Secondary | ICD-10-CM

## 2016-06-25 NOTE — Telephone Encounter (Signed)
Is this okay to refill? 

## 2016-06-25 NOTE — Telephone Encounter (Signed)
Last filled #20 on 9/14. Okay to refill #20, no add'l refill. Thanks

## 2016-06-28 ENCOUNTER — Encounter (HOSPITAL_COMMUNITY): Payer: Self-pay | Admitting: *Deleted

## 2016-06-28 ENCOUNTER — Ambulatory Visit (HOSPITAL_COMMUNITY)
Admission: EM | Admit: 2016-06-28 | Discharge: 2016-06-28 | Disposition: A | Discharge status: Home / Self Care | Payer: BLUE CROSS/BLUE SHIELD | Attending: Emergency Medicine | Admitting: Emergency Medicine

## 2016-06-28 DIAGNOSIS — J00 Acute nasopharyngitis [common cold]: Secondary | ICD-10-CM

## 2016-06-28 MED ORDER — PREDNISONE 10 MG PO TABS: ORAL_TABLET | ORAL | 0 refills | Status: DC

## 2016-06-28 MED ORDER — BENZONATATE 100 MG PO CAPS: 100.0000 mg | ORAL_CAPSULE | Freq: Three times a day (TID) | ORAL | 0 refills | Status: DC

## 2016-06-28 NOTE — ED Provider Notes (Signed)
[place]    CSN: [id number] Arrival date & time: 06/28/16  1200     History   Chief Complaint Chief Complaint  Patient presents with  . URI    HPI Susan Gould is a 50 y.o. female.   HPI  She is a 50 year old woman here for evaluation of sinus symptoms. She states her symptoms started 3 days ago with a sore throat. Yesterday, she developed nasal congestion, rhinorrhea, postnasal drainage, and cough. She denies any fevers, ear pain, shortness of breath, or wheezing. No nausea or vomiting. She is currently taking Flonase and Zyrtec without improvement. She has tried some Robitussin-DM last night without improvement. She was exposed to sick kids about a week ago.  Past Medical History:  Diagnosis Date  . Allergic rhinitis, cause unspecified    cat/dog/ragweed/mold/wool, s/p immunotherapy x 3-4 yrs  . Anxiety 2006  . Asthma    (Dr. Joya Gaskins)  . Colon polyps 2007   Dr. Oletta Lamas (she was told repeat in 10 years)  . Depression 2006  . Endometriosis    Dr. Leo Grosser  . Family history of breast cancer in female   . GERD (gastroesophageal reflux disease)   . Hemorrhoid    internal and external  . Hyperlipidemia   . Hypertension   . Insulin resistance    Dr. Leo Grosser  . Medial meniscus tear    Left. Surgery [name].  NOT seen on MRI 06/2013  . Obesity   . OSA (obstructive sleep apnea)    on CPAP (Dr. Gwenette Greet)    Patient Active Problem List   Diagnosis Date Noted  . Acute exacerbation of moderate persistent extrinsic asthma 06/02/2016  . Class 3 obesity due to excess calories with serious comorbidity and body mass index (BMI) of 45.0 to 49.9 in adult ([place]) 04/23/2016  . Pre-operative clearance 09/23/2015  . Mixed hyperlipidemia 07/29/2011  . OA (osteoarthritis) of knee 07/29/2011  . Morbid obesity with body mass index of 45.0-49.9 in adult [facility]) 03/17/2010  . Insulin resistance syndrome 11/13/2009  . HYPERLIPIDEMIA 11/13/2009  . Anxiety state 11/13/2009    . Essential hypertension 11/13/2009  . Moderate persistent asthma in adult without complication 37/90/2409  . GERD 11/13/2009  . ENDOMETRIOSIS 11/13/2009  . OSA (obstructive sleep apnea) 11/13/2009    Past Surgical History:  Procedure Laterality Date  . CHOLECYSTECTOMY  1986  . FINGER GANGLION CYST EXCISION  2003   RIGHT 3rd finger  . KNEE SURGERY  '95 and '97   BILATERAL KNEE  . LAPAROSCOPY  '98, '01   endometriosis (Dr. Leo Grosser)  . LASIK  2000  . tibial tuberoplasty  90's   left    OB History    Gravida Para Term Preterm AB Living   0 0 0 0 0 0   SAB TAB Ectopic Multiple Live Births   0 0 0 0         Home Medications    Prior to Admission medications   Medication Sig Start Date End Date Taking? Authorizing Provider  ALPRAZolam (XANAX) 0.5 MG tablet TAKE 1/2 -1 TABLET BY MOUTH 3 TIMES DAILY AS NEEDED FOR ANXIETY 06/25/16   Rita Ohara, MD  atorvastatin (LIPITOR) 40 MG tablet Take 1 tablet (40 mg total) by mouth daily. 03/12/16   Rita Ohara, MD  benzonatate (TESSALON) 100 MG capsule Take 1 capsule (100 mg total) by mouth every 8 (eight) hours. 06/28/16   Melony Overly, MD  buPROPion (WELLBUTRIN XL) 300 MG 24 hr tablet Take  1 tablet (300 mg total) by mouth daily. 03/12/16   Rita Ohara, MD  Calcium Carbonate-Vitamin D (CALCIUM + D) 600-200 MG-UNIT TABS Take 2 tablets by mouth daily.      Historical Provider, MD  cetirizine (ZYRTEC) 10 MG tablet Take 10 mg by mouth daily.      Historical Provider, MD  citalopram (CELEXA) 40 MG tablet take 1 tablet by mouth once daily 03/12/16   Rita Ohara, MD  cyclobenzaprine (FLEXERIL) 5 MG tablet as needed. Reported on 09/02/2015    Historical Provider, MD  diclofenac (VOLTAREN) 75 MG EC tablet Take 1 tablet (75 mg total) by mouth 2 (two) times daily. 03/12/16   Rita Ohara, MD  Diclofenac Sodium (PENNSAID) 1.5 % SOLN Place 1 each onto the skin 2 (two) times daily.    Historical Provider, MD  fish oil-omega-3 fatty acids 1000 MG capsule Take 3 g by  mouth daily.     Historical Provider, MD  fluticasone (FLONASE) 50 MCG/ACT nasal spray INSTILL 1 TO 2 SPRAYS INTO EACH NOSTRIL ONCE A DAY 03/07/13   Elsie Stain, MD  hydrochlorothiazide (HYDRODIURIL) 25 MG tablet Take 1 tablet (25 mg total) by mouth daily. 03/12/16   Rita Ohara, MD  losartan (COZAAR) 25 MG tablet Take 1 tablet (25 mg total) by mouth daily. 03/12/16   Rita Ohara, MD  metFORMIN (GLUCOPHAGE-XR) 500 MG 24 hr tablet take 2 tablets by mouth every morning and 1 tablet every evening 03/12/16   Rita Ohara, MD  mometasone-formoterol (DULERA) 100-5 MCG/ACT AERO Inhale 2 puffs into the lungs 2 (two) times daily. 06/02/16   Brand Males, MD  Multiple Vitamins-Minerals (MULTIVITAMIN WITH MINERALS) tablet Take 1 tablet by mouth daily.      Historical Provider, MD  omeprazole (PRILOSEC) 20 MG capsule Take 20 mg by mouth daily.      Historical Provider, MD  Phentermine-Topiramate (QSYMIA) 11.25-69 MG CP24 Take 1 capsule by mouth daily. 06/04/16   Rita Ohara, MD  predniSONE (DELTASONE) 10 MG tablet Take 6 tablets on day 1, 5 on day 2, 4 on day 3, 3 on day 4, 2 on day 5, 1 on day 6. 06/28/16   Melony Overly, MD  vitamin B-12 (CYANOCOBALAMIN) 1000 MCG tablet Take 1,000 mcg by mouth daily.      Historical Provider, MD  vitamin C (ASCORBIC ACID) 500 MG tablet Take 500 mg by mouth daily.    Historical Provider, MD    Family History Family History  Problem Relation Age of Onset  . Diabetes Mother   . Hypertension Mother   . Hyperlipidemia Mother   . Arthritis Mother   . Cancer Mother 51    breast cancer  . Breast cancer Mother 64  . Diabetes Father   . Hyperlipidemia Father   . Hypertension Father   . Asthma Father   . Arthritis Father   . Heart disease Father     CHF, hypokinesis  . COPD Father     due to asbestos exposure; former smoker  . Pulmonary embolism Father   . Hypertension Sister   . Colon cancer Maternal Grandmother     late 68's  . Cancer Maternal Grandmother     colon  .  Heart disease Maternal Grandmother     MI in 29's  . Breast cancer Paternal Aunt     98's  . Cancer Paternal Aunt     breast  . Heart disease Paternal Uncle   . Diabetes Paternal Uncle   .  Kidney disease Paternal Uncle     Social History Social History  Substance Use Topics  . Smoking status: Never Smoker  . Smokeless tobacco: Never Used  . Alcohol use Yes     Comment: 2-3 drinks a week, beer; drinking vodka more often (nightly) due to stress (03/2016)     Allergies   Meloxicam   Review of Systems Review of Systems As in history of present illness  Physical Exam Triage Vital Signs ED Triage Vitals  Enc Vitals Group     BP      Pulse      Resp      Temp      Temp src      SpO2      Weight      Height      Head Circumference      Peak Flow      Pain Score      Pain Loc      Pain Edu?      Excl. in [place]?    No data found.   Updated Vital Signs BP 128/78 (BP Location: Right Arm)   Pulse 78   Temp 98.6 F (37 C) (Oral)   Resp 18   LMP 01/27/2009 (Exact Date)   SpO2 97%   Visual Acuity Right Eye Distance:   Left Eye Distance:   Bilateral Distance:    Right Eye Near:   Left Eye Near:    Bilateral Near:     Physical Exam  Constitutional: She is oriented to person, place, and time. She appears well-developed and well-nourished. No distress.  HENT:  Mouth/Throat: Oropharynx is clear and moist. No oropharyngeal exudate.  TMs normal bilaterally. Nasal mucosa is quite erythematous and inflamed. Clear drainage seen. No sinus tenderness.  Neck: Neck supple.  Cardiovascular: Normal rate, regular rhythm and normal heart sounds.   No murmur heard. Pulmonary/Chest: Effort normal and breath sounds normal. No respiratory distress. She has no wheezes. She has no rales.  Lymphadenopathy:    She has no cervical adenopathy.  Neurological: She is alert and oriented to person, place, and time.     UC Treatments / Results  Labs (all labs ordered are listed, but  only abnormal results are displayed) Labs Reviewed - No data to display  EKG  EKG Interpretation None       Radiology No results found.  Procedures Procedures (including critical care time)  Medications Ordered in UC Medications - No data to display   Initial Impression / Assessment and Plan / UC Course  I have reviewed the triage vital signs and the nursing notes.  Pertinent labs & imaging results that were available during my care of the patient were reviewed by me and considered in my medical decision making (see chart for details).  Clinical Course     No sign of bacterial infection at this time. She'll continue her Zyrtec and Flonase. I recommended she switch to Mucinex. Will prescribe a prednisone taper and Tessalon for symptom control. Discussed expected time course. Follow-up with PCP or here if no improvement in 1 week.  Final Clinical Impressions(s) / UC Diagnoses   Final diagnoses:  Acute nasopharyngitis    New Prescriptions Discharge Medication List as of 06/28/2016 12:25 PM    START taking these medications   Details  benzonatate (TESSALON) 100 MG capsule Take 1 capsule (100 mg total) by mouth every 8 (eight) hours., Starting Sun 06/28/2016, Normal         Junie Panning  Earlene Plater, MD 06/28/16 [phone]

## 2016-06-28 NOTE — Discharge Instructions (Signed)
You have a nasty cold. There is no sign of pneumonia, ear infection, or sinus infection. Continue your Zyrtec and Flonase. Start taking Mucinex 1200 mg twice a day. Take the prednisone taper as prescribed. Tessalon 3 times a day as needed for cough. You should start to feel better by Tuesday, but will likely be the end of the week before your back to normal. If you develop fevers, difficulty breathing, or just not getting better after a week, please come back or see your primary care doctor.

## 2016-06-28 NOTE — ED Triage Notes (Signed)
Pt  Reports  Symptoms  Of nasal  Drainage  /  Congestion  With  Onset  Of symptoms  X  3  Days     Pt  Ambulated  To  Exam room  In no  Acute  Distress  She  Stated   He  Symptoms  Are  Not releived  By otc  meds    She  Is  Awake and alert and  Oriented    She  Reports  The  Symptoms  Started  Off as a  Sore-throat

## 2016-07-04 ENCOUNTER — Other Ambulatory Visit: Payer: Self-pay | Admitting: Family Medicine

## 2016-07-04 DIAGNOSIS — M179 Osteoarthritis of knee, unspecified: Secondary | ICD-10-CM

## 2016-07-04 DIAGNOSIS — M1710 Unilateral primary osteoarthritis, unspecified knee: Secondary | ICD-10-CM

## 2016-07-08 ENCOUNTER — Encounter: Payer: Self-pay | Admitting: Family Medicine

## 2016-07-08 ENCOUNTER — Telehealth: Payer: Self-pay | Admitting: Family Medicine

## 2016-07-08 NOTE — Telephone Encounter (Signed)
PA Completed for Qsymia, await results

## 2016-07-09 NOTE — Telephone Encounter (Signed)
PA approved for Qsymia from 07/08/16 to 01/03/17.  Called pt made aware.

## 2016-08-04 NOTE — Progress Notes (Signed)
Chief Complaint  Patient presents with  . Med Check    for weightloss. Having some side effects that she would like discuss today.,    Patient presents to f/u on obesity.  Qsymia dose was titrated up to the 11.53m dose 2 months ago.  She has lost 16# in the last 2 months.  She is noticing some altered taste, and some filmy "gunk" in her mouth. [place] She isn't sure if this could be related to the cold, thrush, or her medication. Slight residual cough, rest of cold symptoms resolved.  Has some nausea in the mid-afternoon, after lunch, relieved by eating a small snack.  Denies any epigastric pain.  Denies hypoglycemia. Denies dental pain.  Some more intense mood swings noted since last visit, denies SI. Overall doing much better with her moods, in regards to relationship issue discussed at last visit.   She has taken up the drums, which has helped.  Interested in changing to generics for the topamax and phentermine to save money (costing $190/month for the Qsymia)  PMH, PSH, SFriendshipreviewed  Current Outpatient Prescriptions on File Prior to Visit  Medication Sig Dispense Refill  . atorvastatin (LIPITOR) 40 MG tablet Take 1 tablet (40 mg total) by mouth daily. 30 tablet 5  . buPROPion (WELLBUTRIN XL) 300 MG 24 hr tablet Take 1 tablet (300 mg total) by mouth daily. 30 tablet 5  . Calcium Carbonate-Vitamin D (CALCIUM + D) 600-200 MG-UNIT TABS Take 2 tablets by mouth daily.      . cetirizine (ZYRTEC) 10 MG tablet Take 10 mg by mouth daily.      . citalopram (CELEXA) 40 MG tablet take 1 tablet by mouth once daily 30 tablet 5  . diclofenac (VOLTAREN) 75 MG EC tablet take 1 tablet by mouth twice a day 60 tablet 2  . Diclofenac Sodium (PENNSAID) 1.5 % SOLN Place 1 each onto the skin 2 (two) times daily.    . fish oil-omega-3 fatty acids 1000 MG capsule Take 3 g by mouth daily.     . fluticasone (FLONASE) 50 MCG/ACT nasal spray INSTILL 1 TO 2 SPRAYS INTO EACH  NOSTRIL ONCE A DAY 16 g 11  . hydrochlorothiazide (HYDRODIURIL) 25 MG tablet Take 1 tablet (25 mg total) by mouth daily. 30 tablet 5  . losartan (COZAAR) 25 MG tablet Take 1 tablet (25 mg total) by mouth daily. 30 tablet 5  . metFORMIN (GLUCOPHAGE-XR) 500 MG 24 hr tablet take 2 tablets by mouth every morning and 1 tablet every evening 90 tablet 5  . mometasone-formoterol (DULERA) 100-5 MCG/ACT AERO Inhale 2 puffs into the lungs 2 (two) times daily. 1 Inhaler 6  . Multiple Vitamins-Minerals (MULTIVITAMIN WITH MINERALS) tablet Take 1 tablet by mouth daily.      .Marland Kitchenomeprazole (PRILOSEC) 20 MG capsule Take 20 mg by mouth daily.      . vitamin B-12 (CYANOCOBALAMIN) 1000 MCG tablet Take 1,000 mcg by mouth daily.      . vitamin C (ASCORBIC ACID) 500 MG tablet Take 500 mg by mouth daily.    .Marland KitchenALPRAZolam (XANAX) 0.5 MG tablet TAKE 1/2 -1 TABLET BY MOUTH 3 TIMES DAILY AS NEEDED FOR ANXIETY (Patient not taking: Reported on 08/05/2016) 20 tablet 0  . cyclobenzaprine (FLEXERIL) 5 MG tablet as needed. Reported on 09/02/2015     No current facility-administered medications on file prior to visit.    Taking qsymia 11.25 prior to today's visit  Allergies  Allergen Reactions  . Meloxicam Itching    ROS:  No fever, chills.  Recent URI, resolved--slight residual congestion.  Denies shortness of breath. No vomiting, bowel changes.  +change in taste and mild nausea as per HPI. Depression overall improved. Denies SI. No chest pain, palpitations, edema, rash or other complaints. +intentional weight loss, per HPI  PHYSICAL EXAM:  BP 110/68 (BP Location: Left Arm, Patient Position: Sitting, Cuff Size: Normal)   Pulse 76   Ht 5' 4"  (1.626 m)   Wt 244 lb 12.8 oz (111 kg)   LMP 02/09/2009 (Exact Date)   BMI 42.02 kg/m   Wt Readings from Last 3 Encounters:  08/05/16 244 lb 12.8 oz (111 kg)  06/04/16 261 lb (118.4 kg)  06/02/16 261 lb (118.4 kg)    Well appearing, pleasant female in no distress.  HEENT: PERRL,  EOMI, conjunctiva and sclera are clear.  TMs and EACs normal. Nasal mucosa mildly edematous, no purulence or erythema. Sinuses nontender. OP is clear Neck: no lymphadenopathy Heart: regular rate and rhythm Lungs: clear bilaterally Neuro: alert and oriented, cranial nerves intact, normal gait Psych: normal mood, affect, hygiene and grooming  ASSESSMENT/PLAN:  Essential hypertension - well controlled  Class 3 obesity due to excess calories with serious comorbidity and body mass index (BMI) of 45.0 to 49.9 in adult [facility] [place]) - separate meds for cost purposes; doses slightly different, risks/SE reviewed - Plan: phentermine 15 MG capsule, topiramate (TOPAMAX) 25 MG tablet  Depression, major, in remission ([place]) - stable overall.  Continue current meds, stress reduction, exercise    We are changing from Qsymia to similar separate medications which are available in generic.  The doses are slightly different. 66m phentermine--this is slightly higher than what you have been taking (11.25), but still very low dose.  You might see slight increase in side effects. 258mtopamax--7567m(start at 2 (63m65mhe first morning, and if not at all sedating, try taking all 3 the next morning, to be in line with how you were taking the qsymia).   F/u as scheduled in April--fasting

## 2016-08-05 ENCOUNTER — Ambulatory Visit (INDEPENDENT_AMBULATORY_CARE_PROVIDER_SITE_OTHER): Payer: BLUE CROSS/BLUE SHIELD | Admitting: Family Medicine

## 2016-08-05 ENCOUNTER — Encounter: Payer: Self-pay | Admitting: Family Medicine

## 2016-08-05 VITALS — BP 110/68 | HR 76 | Ht 64.0 in | Wt 244.8 lb

## 2016-08-05 DIAGNOSIS — IMO0001 Reserved for inherently not codable concepts without codable children: Secondary | ICD-10-CM

## 2016-08-05 DIAGNOSIS — F325 Major depressive disorder, single episode, in full remission: Secondary | ICD-10-CM

## 2016-08-05 DIAGNOSIS — I10 Essential (primary) hypertension: Secondary | ICD-10-CM | POA: Diagnosis not present

## 2016-08-05 DIAGNOSIS — Z6841 Body Mass Index (BMI) 40.0 and over, adult: Secondary | ICD-10-CM | POA: Diagnosis not present

## 2016-08-05 DIAGNOSIS — E6609 Other obesity due to excess calories: Secondary | ICD-10-CM | POA: Diagnosis not present

## 2016-08-05 MED ORDER — PHENTERMINE HCL 15 MG PO CAPS: 15.0000 mg | ORAL_CAPSULE | ORAL | 2 refills | Status: DC

## 2016-08-05 MED ORDER — TOPIRAMATE 25 MG PO TABS: 75.0000 mg | ORAL_TABLET | Freq: Every day | ORAL | 2 refills | Status: DC

## 2016-08-05 NOTE — Patient Instructions (Signed)
  We are changing from Qsymia to similar separate medications which are available in generic.  The doses are slightly different. 28m phentermine--this is slightly higher than what you have been taking (11.25), but still very low dose.  You might see slight increase in side effects. 231mtopamax--7526m(start at 2 (60m84mhe first morning, and if not at all sedating, try taking all 3 the next morning, to be in line with how you were taking the qsymia).

## 2016-08-08 ENCOUNTER — Telehealth: Payer: Self-pay | Admitting: Family Medicine

## 2016-08-08 NOTE — Telephone Encounter (Signed)
P.A. PHENTERMINE

## 2016-08-11 ENCOUNTER — Encounter: Payer: Self-pay | Admitting: Family Medicine

## 2016-08-11 NOTE — Telephone Encounter (Signed)
P.A. Approved til 08/07/17

## 2016-08-12 NOTE — Telephone Encounter (Signed)
Called pt informed

## 2016-08-30 ENCOUNTER — Other Ambulatory Visit: Payer: Self-pay | Admitting: Family Medicine

## 2016-08-30 DIAGNOSIS — I10 Essential (primary) hypertension: Secondary | ICD-10-CM

## 2016-09-02 ENCOUNTER — Other Ambulatory Visit: Payer: Self-pay | Admitting: Family Medicine

## 2016-09-02 DIAGNOSIS — F411 Generalized anxiety disorder: Secondary | ICD-10-CM

## 2016-09-02 DIAGNOSIS — F325 Major depressive disorder, single episode, in full remission: Secondary | ICD-10-CM

## 2016-09-09 ENCOUNTER — Other Ambulatory Visit: Payer: Self-pay | Admitting: Family Medicine

## 2016-09-09 DIAGNOSIS — F325 Major depressive disorder, single episode, in full remission: Secondary | ICD-10-CM

## 2016-09-24 DIAGNOSIS — G4733 Obstructive sleep apnea (adult) (pediatric): Secondary | ICD-10-CM | POA: Diagnosis not present

## 2016-09-27 ENCOUNTER — Other Ambulatory Visit: Payer: Self-pay | Admitting: Family Medicine

## 2016-09-27 DIAGNOSIS — I10 Essential (primary) hypertension: Secondary | ICD-10-CM

## 2016-09-29 ENCOUNTER — Other Ambulatory Visit: Payer: Self-pay | Admitting: Family Medicine

## 2016-09-29 DIAGNOSIS — I10 Essential (primary) hypertension: Secondary | ICD-10-CM

## 2016-09-29 NOTE — Progress Notes (Signed)
Chief Complaint  Patient presents with  . Hypertension    fasting med check. Is having some issues with dizziness when she stands up or bends over.    Patient presents for routine med check, f/u on chronic problems, including obesity.  She reports some dizziness with bending over when she started taking Qsymia, a little worse with each dose increase.  She has been checking BP at home, some of which was shortly after feeling dizzy.  They are running 116-133/56-74.  Spells occur with working out, but other times as well. Seems to occur with exertion--lugging groceries, vacuuming, not necessarily when perspiring or dehydrated.  Hypertension follow-up: BP's as above, with some dizziness.  No chest pain, edema. Denies side effects of medications. She has an occasional dry cough, which started prior to BP meds and seems to be related to reflux, as cough is worse if she doesn't take prilosec and has reflux. Cough is infrequent/tolerable.   Obesity:  Changed from Qsymia to generic topiramate and phentermine at last visit. Altered taste continues, a little nausea.  No vomiting.  She has lost an additional 10# since her last visit. Current exercise:  Only 1x/week for the last 2 weeks, due to work (14 hour days)--this decrease was not related to any mood issues or lack of motivation, just lack of time. Admits she could be doing more on the weekends.  Depression/anxiety: On Wellbutrin and citalopram. Denies side effects. Not needing alprazolam. Last refilled 12/28//2017,#20.  Stressors include elderly dad with Parkinson's (living at [facility], visits 2-3x/wk).  Has a lot of work stress--working long hours.  She had relationship stress--prior to last visit; she took up playing the drums, which helped with her moods. Hasn't had time to play recently.  No longer using alcohol to relax, just social.   Hyperlipidemia follow-up: Patient is reportedly following a low-fat, low cholesterol diet. Compliant with  medications and denies medication side effects.  With taste changes from the medication, her stress eating foods have changed.  Now will have [employer] ice cream (and will eat a whole pint). Lab Results  Component Value Date   CHOL 167 03/12/2016   HDL 58 03/12/2016   LDLCALC 81 03/12/2016   TRIG 141 03/12/2016   CHOLHDL 2.9 03/12/2016   PCOS/insulin resistance: Compliant with metformin. Denies polydipsia, polyuria. She has some tingling in her feet (on both feet, between the great and 2nd toes (medially), ongoing, without change.  Lab Results  Component Value Date   HGBA1C 5.7 03/12/2016    GERD--takes Prilosec less often--takes it when she knows something might trigger it, about every other day.  Not coughing on the days she doesn't take it, or heartburn.  Denies dysphagia.   OA--L knee. She had left unicompartmental knee arthroplasty by Dr. Mardelle Matte in April 2017. Still has some pain, achiness on rainy days, or if she overworks it, but she has improved overall, manageable.  Asthma and allergies--well controlled on her current regimen. Hasn't needed to use any albuterol. She is under the care of Dr. Chase Caller, last seen in December. She has no upcoming scheduled appointment. She also sees pulmonary for OSA. She is wearing her CPAP and doing well.  Dequervain's tenosynovitis on the right. She sees Dr. Lynann Bologna, and gets voltaren gel from her. That hasn't been helping enough, also needing to take the diclofenac tablets, and recently has been needing it twice daily.  In the past had done okay with once daily use. She had injection on the left, which  wasn't all that helpful, so she hasn't been rushing to go back for follow-up (and still owes practice some money related to her knee surgery).  UTD with GYN and pap smears and mammogram. Past due for colonoscopy (last 2005 Dr. Oletta Lamas). Due also to arrange for pulmonary f/u.  Taking iron supplements and Flinstones vitamins. She has been  donating platelets and plasma. She last donated plasma 2 weeks ago. She had some instances where she wasn't allowed to donate, due to Hg being <12.5, sometimes <11 per her report. After taking iron and Flinstones, Hg >13 on last check.  PMH, PSH, SH reviewed  Outpatient Encounter Prescriptions as of 09/30/2016  Medication Sig Note  . atorvastatin (LIPITOR) 40 MG tablet Take 1 tablet (40 mg total) by mouth daily.   Marland Kitchen buPROPion (WELLBUTRIN XL) 300 MG 24 hr tablet take 1 tablet by mouth once daily   . Calcium Carbonate-Vitamin D (CALCIUM + D) 600-200 MG-UNIT TABS Take 2 tablets by mouth daily.     . cetirizine (ZYRTEC) 10 MG tablet Take 10 mg by mouth daily.     . citalopram (CELEXA) 40 MG tablet take 1 tablet by mouth once daily   . diclofenac (VOLTAREN) 75 MG EC tablet take 1 tablet by mouth twice a day   . Diclofenac Sodium (PENNSAID) 1.5 % SOLN Place 1 each onto the skin 2 (two) times daily.   . fish oil-omega-3 fatty acids 1000 MG capsule Take 3 g by mouth daily.    . fluticasone (FLONASE) 50 MCG/ACT nasal spray INSTILL 1 TO 2 SPRAYS INTO EACH NOSTRIL ONCE A DAY   . hydrochlorothiazide (HYDRODIURIL) 25 MG tablet take 1 tablet by mouth once daily   . losartan (COZAAR) 25 MG tablet Take 1 tablet (25 mg total) by mouth daily.   . metFORMIN (GLUCOPHAGE-XR) 500 MG 24 hr tablet take 2 tablets by mouth every morning and 1 tablet every evening   . mometasone-formoterol (DULERA) 100-5 MCG/ACT AERO Inhale 2 puffs into the lungs 2 (two) times daily.   . Multiple Vitamins-Minerals (MULTIVITAMIN WITH MINERALS) tablet Take 1 tablet by mouth daily.     Marland Kitchen nystatin (MYCOSTATIN/NYSTOP) powder Apply 1 g topically 2 (two) times daily.    . phentermine 15 MG capsule Take 1 capsule (15 mg total) by mouth every morning.   . topiramate (TOPAMAX) 25 MG tablet Take 3 tablets (75 mg total) by mouth daily.   . vitamin B-12 (CYANOCOBALAMIN) 1000 MCG tablet Take 1,000 mcg by mouth daily.     . vitamin C (ASCORBIC ACID)  500 MG tablet Take 500 mg by mouth daily.   Marland Kitchen ALPRAZolam (XANAX) 0.5 MG tablet TAKE 1/2 -1 TABLET BY MOUTH 3 TIMES DAILY AS NEEDED FOR ANXIETY (Patient not taking: Reported on 08/05/2016)   . cyclobenzaprine (FLEXERIL) 5 MG tablet as needed. Reported on 09/02/2015 02/14/2014: Takes prn back pain (last rx'd by Dr. Leo Grosser; also helps with endometriosis pain)  . omeprazole (PRILOSEC) 20 MG capsule Take 20 mg by mouth daily.      No facility-administered encounter medications on file as of 09/30/2016.    Also taking iron supplement and Flinstone vitamin daily.   ROS: no fever, chills, chest pain, palpitations, bowel changes, urinary complaints.  Dizziness per HPI.  +nausea and taste changes related to weight loss meds, per HPI.  Moods are stable/improved.  +work stress.  See HPI. She is asking to have her right ear checked, as it has been hurting her.  Denies URI symptoms.  PHYSICAL EXAM:  BP 118/70 (BP Location: Left Arm, Patient Position: Sitting, Cuff Size: Normal)   Pulse 76   Ht 5' 4"  (1.626 m)   Wt 234 lb (106.1 kg)   LMP 02/09/2009 (Exact Date)   BMI 40.17 kg/m   Wt Readings from Last 3 Encounters:  09/30/16 234 lb (106.1 kg)  08/05/16 244 lb 12.8 oz (111 kg)  06/04/16 261 lb (118.4 kg)   BP laying 100/60 P 68 BP sitting 112/72 P 80 BP standing 102/62 P 72   Well developed, pleasant, morbidly obese female in no distress HEENT:  EOMI, conjunctiva clear. Right TM--somewhat diffuse light reflex, otherwise normal, normal EAC. OP clear Neck: no lymphadenopathy or thyromegaly, no carotid bruit Heart: regular rate and rhythm, no murmur Lungs: clear bilaterally, no wheezes Abdomen: obese, soft, nontender, no mass, no organomegaly  Back: no spinal or CVA tendeness Extremities: normal pulses, no edema.  Skin: no suspicious lesions, bruising, normal turgor Neuro: alert and oriented, normal gait, cranial nerves Psych: normal mood, affect, hygiene and grooming   A1c  5.8%  ASSESSMENT/PLAN:  Essential hypertension - BP's normal at home, ranging up to 130's.  Lower here, not orthostatic. ?pre-renal, vs related to weight loss.  Consider cutting back HCTZ dose. Await labs - Plan: Comprehensive metabolic panel, hydrochlorothiazide (HYDRODIURIL) 25 MG tablet, losartan (COZAAR) 25 MG tablet  Mixed hyperlipidemia - reviewed lowfat, low cholesterol diet. +ice cream recently - Plan: Lipid panel, atorvastatin (LIPITOR) 40 MG tablet  Insulin resistance syndrome - A1c mildly elevated, on metformin. Continue metformin; diet reviewed. Continue exercise, weight loss - Plan: HgB A1c, metFORMIN (GLUCOPHAGE-XR) 500 MG 24 hr tablet  Class 3 obesity due to excess calories with serious comorbidity and body mass index (BMI) of 45.0 to 49.9 in adult ([place]) - significant weight loss with phentermine/topiramate combo, BMI now 40. Counseled re: diet, exercise, stress reduction.  Continue meds - Plan: TSH, phentermine 15 MG capsule, topiramate (TOPAMAX) 25 MG tablet  Gastroesophageal reflux disease, esophagitis presence not specified - improving with weight loss; no longer needing PPI daily, just prn  OSA (obstructive sleep apnea) - continue CPAP  Medication monitoring encounter - Plan: Comprehensive metabolic panel, Lipid panel, CBC with Differential/Platelet  Moderate persistent asthma in adult without complication - stable, doing well.  Plans to schedule f/u with pulm  Anxiety state - Plan: citalopram (CELEXA) 40 MG tablet  Depression, major, in remission ([place]) - Plan: buPROPion (WELLBUTRIN XL) 300 MG 24 hr tablet, citalopram (CELEXA) 40 MG tablet  Dizziness - Plan: CBC with Differential/Platelet, TSH  Other fatigue - Plan: Comprehensive metabolic panel, CBC with Differential/Platelet, TSH  Essential hypertension - controlled; continue current regimen - Plan: Comprehensive metabolic panel, hydrochlorothiazide (HYDRODIURIL) 25 MG tablet, losartan (COZAAR) 25 MG  tablet  Tenosynovitis of forearm - use diclofenac up to BID--recommend using for 1-2 weeks, not long-term. F/u with Dr. Lynann Bologna for injection if not improving. - Plan: diclofenac (VOLTAREN) 75 MG EC tablet   A1c, c-met, lipids, CBC, TSH  Dizziness--not orthostatic, but BP's lower than they need to be.  Suspect sx may be related to lower BP's, and with her weight loss, may need less medication.  Await labs (to ensure no other causes), and consider decreasing HCTZ dose in half.  Will advise after results seen.  BP's at home have been higher, so hesitant to make a change just yet--however with ongoing weight loss, cutting back on her meds is inevitable (and good!)  shingrix recommended, counseled.  f/u in 6 mos for  CPE Reminded to schedule colonoscopy, past due.  f/u 3 mo med check, sooner prn.

## 2016-09-30 ENCOUNTER — Encounter: Payer: Self-pay | Admitting: Family Medicine

## 2016-09-30 ENCOUNTER — Ambulatory Visit (INDEPENDENT_AMBULATORY_CARE_PROVIDER_SITE_OTHER): Payer: BLUE CROSS/BLUE SHIELD | Admitting: Family Medicine

## 2016-09-30 VITALS — BP 112/72 | HR 80 | Ht 64.0 in | Wt 234.0 lb

## 2016-09-30 DIAGNOSIS — M659 Synovitis and tenosynovitis, unspecified: Secondary | ICD-10-CM

## 2016-09-30 DIAGNOSIS — F411 Generalized anxiety disorder: Secondary | ICD-10-CM

## 2016-09-30 DIAGNOSIS — E8881 Metabolic syndrome: Secondary | ICD-10-CM | POA: Diagnosis not present

## 2016-09-30 DIAGNOSIS — E782 Mixed hyperlipidemia: Secondary | ICD-10-CM

## 2016-09-30 DIAGNOSIS — F325 Major depressive disorder, single episode, in full remission: Secondary | ICD-10-CM

## 2016-09-30 DIAGNOSIS — K219 Gastro-esophageal reflux disease without esophagitis: Secondary | ICD-10-CM | POA: Diagnosis not present

## 2016-09-30 DIAGNOSIS — IMO0001 Reserved for inherently not codable concepts without codable children: Secondary | ICD-10-CM

## 2016-09-30 DIAGNOSIS — Z5181 Encounter for therapeutic drug level monitoring: Secondary | ICD-10-CM

## 2016-09-30 DIAGNOSIS — I10 Essential (primary) hypertension: Secondary | ICD-10-CM | POA: Diagnosis not present

## 2016-09-30 DIAGNOSIS — M65939 Unspecified synovitis and tenosynovitis, unspecified forearm: Secondary | ICD-10-CM

## 2016-09-30 DIAGNOSIS — E6609 Other obesity due to excess calories: Secondary | ICD-10-CM | POA: Diagnosis not present

## 2016-09-30 DIAGNOSIS — R5383 Other fatigue: Secondary | ICD-10-CM

## 2016-09-30 DIAGNOSIS — R42 Dizziness and giddiness: Secondary | ICD-10-CM | POA: Diagnosis not present

## 2016-09-30 DIAGNOSIS — J4540 Moderate persistent asthma, uncomplicated: Secondary | ICD-10-CM | POA: Diagnosis not present

## 2016-09-30 DIAGNOSIS — G4733 Obstructive sleep apnea (adult) (pediatric): Secondary | ICD-10-CM

## 2016-09-30 DIAGNOSIS — Z6841 Body Mass Index (BMI) 40.0 and over, adult: Secondary | ICD-10-CM

## 2016-09-30 LAB — CBC WITH DIFFERENTIAL/PLATELET
BASOS PCT: 0 %
Basophils Absolute: 0 cells/uL (ref 0–200)
Eosinophils Absolute: 213 cells/uL (ref 15–500)
Eosinophils Relative: 3 %
HEMATOCRIT: 35.8 % (ref 35.0–45.0)
HEMOGLOBIN: 11.9 g/dL (ref 11.7–15.5)
LYMPHS ABS: 2485 {cells}/uL (ref 850–3900)
Lymphocytes Relative: 35 %
MCH: 29.9 pg (ref 27.0–33.0)
MCHC: 33.2 g/dL (ref 32.0–36.0)
MCV: 89.9 fL (ref 80.0–100.0)
MONO ABS: 426 {cells}/uL (ref 200–950)
MPV: 9.8 fL (ref 7.5–12.5)
Monocytes Relative: 6 %
NEUTROS ABS: 3976 {cells}/uL (ref 1500–7800)
Neutrophils Relative %: 56 %
Platelets: 282 10*3/uL (ref 140–400)
RBC: 3.98 MIL/uL (ref 3.80–5.10)
RDW: 14.6 % (ref 11.0–15.0)
WBC: 7.1 10*3/uL (ref 4.0–10.5)

## 2016-09-30 LAB — COMPREHENSIVE METABOLIC PANEL
ALK PHOS: 86 U/L (ref 33–130)
ALT: 52 U/L — ABNORMAL HIGH (ref 6–29)
AST: 41 U/L — AB (ref 10–35)
Albumin: 4.5 g/dL (ref 3.6–5.1)
BILIRUBIN TOTAL: 0.4 mg/dL (ref 0.2–1.2)
BUN: 19 mg/dL (ref 7–25)
CO2: 24 mmol/L (ref 20–31)
Calcium: 9.9 mg/dL (ref 8.6–10.4)
Chloride: 104 mmol/L (ref 98–110)
Creat: 1.09 mg/dL — ABNORMAL HIGH (ref 0.50–1.05)
GLUCOSE: 88 mg/dL (ref 65–99)
Potassium: 3.7 mmol/L (ref 3.5–5.3)
Sodium: 140 mmol/L (ref 135–146)
Total Protein: 7.4 g/dL (ref 6.1–8.1)

## 2016-09-30 LAB — LIPID PANEL
Cholesterol: 177 mg/dL (ref <200)
HDL: 46 mg/dL — ABNORMAL LOW (ref >50)
LDL Cholesterol: 79 mg/dL (ref <100)
Total CHOL/HDL Ratio: 3.8 Ratio (ref <5.0)
Triglycerides: 259 mg/dL — ABNORMAL HIGH (ref <150)
VLDL: 52 mg/dL — ABNORMAL HIGH (ref <30)

## 2016-09-30 LAB — TSH: TSH: 1.19 mIU/L

## 2016-09-30 LAB — POCT GLYCOSYLATED HEMOGLOBIN (HGB A1C): HEMOGLOBIN A1C: 5.8

## 2016-09-30 MED ORDER — TOPIRAMATE 25 MG PO TABS: 75.0000 mg | ORAL_TABLET | Freq: Every day | ORAL | 5 refills | Status: DC

## 2016-09-30 MED ORDER — LOSARTAN POTASSIUM 25 MG PO TABS: 25.0000 mg | ORAL_TABLET | Freq: Every day | ORAL | 5 refills | Status: DC

## 2016-09-30 MED ORDER — CITALOPRAM HYDROBROMIDE 40 MG PO TABS: 40.0000 mg | ORAL_TABLET | Freq: Every day | ORAL | 5 refills | Status: DC

## 2016-09-30 MED ORDER — BUPROPION HCL ER (XL) 300 MG PO TB24: 300.0000 mg | ORAL_TABLET | Freq: Every day | ORAL | 5 refills | Status: DC

## 2016-09-30 MED ORDER — DICLOFENAC SODIUM 75 MG PO TBEC: 75.0000 mg | DELAYED_RELEASE_TABLET | Freq: Two times a day (BID) | ORAL | 0 refills | Status: DC

## 2016-09-30 MED ORDER — HYDROCHLOROTHIAZIDE 25 MG PO TABS: 25.0000 mg | ORAL_TABLET | Freq: Every day | ORAL | 5 refills | Status: DC

## 2016-09-30 MED ORDER — METFORMIN HCL ER 500 MG PO TB24: ORAL_TABLET | ORAL | 5 refills | Status: DC

## 2016-09-30 MED ORDER — PHENTERMINE HCL 15 MG PO CAPS: 15.0000 mg | ORAL_CAPSULE | ORAL | 2 refills | Status: DC

## 2016-09-30 MED ORDER — ATORVASTATIN CALCIUM 40 MG PO TABS: 40.0000 mg | ORAL_TABLET | Freq: Every day | ORAL | 5 refills | Status: DC

## 2016-09-30 NOTE — Patient Instructions (Addendum)
Continue your current medications. We will be in touch with your lab results in 1-2 days.  I recommend getting the new shingles vaccine (Shingrix) when available. You will need to check with your insurance to see if it is covered, and if less expensive to get at our office vs pharmacy.  It is a series of 2 injections, spaced 2 months apart.  I recommend following up with Dr. Lynann Bologna for your Dequervain's, for possible injection (rather than staying on diclofenac twice daily long-term).  Try switching to Halo-top vs Suezanne Jacquet and Jerry's (ideally neither are good--high in cholesterol and sugar, but full pint is much lower in calories).  Try and work in stress reduction and exercise--remember the minimum recommendation for prevention of cardiovascular disease is 150 minutes of cardio each week (can be in 10 min intervals if short on time).

## 2016-10-01 ENCOUNTER — Encounter: Payer: Self-pay | Admitting: Family Medicine

## 2016-10-07 DIAGNOSIS — M654 Radial styloid tenosynovitis [de Quervain]: Secondary | ICD-10-CM | POA: Diagnosis not present

## 2016-10-12 ENCOUNTER — Telehealth: Payer: Self-pay | Admitting: *Deleted

## 2016-10-12 ENCOUNTER — Encounter: Payer: Self-pay | Admitting: Family Medicine

## 2016-10-12 NOTE — Telephone Encounter (Signed)
Patient sent me a MyChart message re:her PA fro her phentermine. She was told it was good until 08/07/17. When she went to fill her rx they would only give her #24...looks her PA was limited to #84 in a 12 weeks period according to what is in "media" from 08/10/16-is there something you need to do to extend this for the year?

## 2016-10-13 ENCOUNTER — Telehealth: Payer: Self-pay | Admitting: Family Medicine

## 2016-10-13 NOTE — Telephone Encounter (Signed)
I spent over an hour on the phone with [employer] getting transferring multiple times and got no where.  Was unable to speak with a supervisor so had to leave a message & they are to call me tomorrow, the call ref is # [id number]

## 2016-10-19 NOTE — Telephone Encounter (Signed)
Called [employer] again and spent over an hour on the phone t# [phone] spoke with utilization management & customer service and both are saying the other should handle this.  Again asked for supervisor and was told there is an email sent to supervisor and I am scheduled to be called today

## 2016-10-20 NOTE — Telephone Encounter (Signed)
Recv'd call back from [place] was advised that there is not an appeal that can be done for benefits limitations.

## 2016-10-20 NOTE — Telephone Encounter (Signed)
Called pt and explained due to the medication being approved and that it is a benefits limitation I am not able to do an appeal and she will have to pay out of pocket.  I explained to patient that this medication is the least expensive of all the weight loss meds.

## 2016-10-26 ENCOUNTER — Encounter: Payer: Self-pay | Admitting: Family Medicine

## 2016-11-10 DIAGNOSIS — M47816 Spondylosis without myelopathy or radiculopathy, lumbar region: Secondary | ICD-10-CM | POA: Diagnosis not present

## 2016-11-12 ENCOUNTER — Other Ambulatory Visit: Payer: Self-pay | Admitting: Family Medicine

## 2016-11-12 DIAGNOSIS — M659 Synovitis and tenosynovitis, unspecified: Secondary | ICD-10-CM

## 2016-11-12 NOTE — Telephone Encounter (Signed)
Is this okay to refill? 

## 2016-11-18 DIAGNOSIS — M545 Low back pain: Secondary | ICD-10-CM | POA: Diagnosis not present

## 2016-11-30 ENCOUNTER — Ambulatory Visit: Payer: BLUE CROSS/BLUE SHIELD | Admitting: Internal Medicine

## 2016-12-10 ENCOUNTER — Ambulatory Visit: Payer: BLUE CROSS/BLUE SHIELD | Admitting: Internal Medicine

## 2016-12-28 DIAGNOSIS — M5126 Other intervertebral disc displacement, lumbar region: Secondary | ICD-10-CM | POA: Diagnosis not present

## 2016-12-28 DIAGNOSIS — M47816 Spondylosis without myelopathy or radiculopathy, lumbar region: Secondary | ICD-10-CM | POA: Diagnosis not present

## 2016-12-28 DIAGNOSIS — M545 Low back pain: Secondary | ICD-10-CM | POA: Diagnosis not present

## 2016-12-31 DIAGNOSIS — M545 Low back pain: Secondary | ICD-10-CM | POA: Diagnosis not present

## 2016-12-31 DIAGNOSIS — M2560 Stiffness of unspecified joint, not elsewhere classified: Secondary | ICD-10-CM | POA: Diagnosis not present

## 2016-12-31 DIAGNOSIS — M47816 Spondylosis without myelopathy or radiculopathy, lumbar region: Secondary | ICD-10-CM | POA: Diagnosis not present

## 2017-01-05 DIAGNOSIS — M2560 Stiffness of unspecified joint, not elsewhere classified: Secondary | ICD-10-CM | POA: Diagnosis not present

## 2017-01-05 DIAGNOSIS — M545 Low back pain: Secondary | ICD-10-CM | POA: Diagnosis not present

## 2017-01-05 DIAGNOSIS — M47816 Spondylosis without myelopathy or radiculopathy, lumbar region: Secondary | ICD-10-CM | POA: Diagnosis not present

## 2017-01-05 NOTE — Progress Notes (Signed)
Chief Complaint  Patient presents with  . Follow-up    follow up on weightloss.    Obesity:  Previously changed from Qsymia to generic topiramate and phentermine, and remains on this regimen. Altered taste continues, nausea resolved.   Has had some more emotional eating after her father's death.  She recently increased her fruit, vegetables.  Is happy that she didn't gain weight during this time (people bringing food, convenience foods in the hospital, etc).  Current exercise:  stopped completely due to back pain and work.  She saw Dr. Lynann Bologna, had MRI, and has started physical therapy last week (Dr. Ron Agee); plans for injection if no response to PT. See scanned MRI report.  She continues to have pain at her right thumb.  Recent injection by Dr. Lynann Bologna wasn't helpful. Didn't elect to have a second.  Thinks surgery may be needed if not improving. Plans to give it more time.  Depression/anxiety: On Wellbutrin and citalopram. Denies side effects.  She recently lost her father. She is doing okay overall. Did okay on Father's day, but is worried about his birthday later this month. Not needing alprazolam. Last refilled 12/28//2017,#20.  Hypertension:  BP's remain well controlled on the 1/2 tablet of HCTZ. Dizziness very rare, much improved since decreasing the dose. GERD:  Remains improved  PMH, PSH, SH reviewed  Outpatient Encounter Prescriptions as of 01/06/2017  Medication Sig Note  . ALPRAZolam (XANAX) 0.5 MG tablet TAKE 1/2 -1 TABLET BY MOUTH 3 TIMES DAILY AS NEEDED FOR ANXIETY   . atorvastatin (LIPITOR) 40 MG tablet Take 1 tablet (40 mg total) by mouth daily.   Marland Kitchen buPROPion (WELLBUTRIN XL) 300 MG 24 hr tablet Take 1 tablet (300 mg total) by mouth daily.   . Calcium Carbonate-Vitamin D (CALCIUM + D) 600-200 MG-UNIT TABS Take 2 tablets by mouth daily.     . cetirizine (ZYRTEC) 10 MG tablet Take 10 mg by mouth daily.     . citalopram (CELEXA) 40 MG tablet Take 1 tablet (40 mg total) by  mouth daily.   . diclofenac (VOLTAREN) 75 MG EC tablet take 1 tablet by mouth twice a day   . Diclofenac Sodium (PENNSAID) 1.5 % SOLN Place 1 each onto the skin 2 (two) times daily.   . fish oil-omega-3 fatty acids 1000 MG capsule Take 3 g by mouth daily.    . flintstones complete (FLINTSTONES) 60 MG chewable tablet Chew 1 tablet by mouth daily.   . fluticasone (FLONASE) 50 MCG/ACT nasal spray INSTILL 1 TO 2 SPRAYS INTO EACH NOSTRIL ONCE A DAY   . hydrochlorothiazide (HYDRODIURIL) 25 MG tablet Take 1 tablet (25 mg total) by mouth daily. 01/06/2017: Taking 1/2 tablet daily  . losartan (COZAAR) 25 MG tablet Take 1 tablet (25 mg total) by mouth daily.   . metFORMIN (GLUCOPHAGE-XR) 500 MG 24 hr tablet take 2 tablets by mouth every morning and 1 tablet every evening   . mometasone-formoterol (DULERA) 100-5 MCG/ACT AERO Inhale 2 puffs into the lungs 2 (two) times daily.   . Multiple Vitamins-Minerals (MULTIVITAMIN WITH MINERALS) tablet Take 1 tablet by mouth daily.     Marland Kitchen nystatin (MYCOSTATIN/NYSTOP) powder Apply 1 g topically 2 (two) times daily.    Marland Kitchen omeprazole (PRILOSEC) 20 MG capsule Take 20 mg by mouth daily.     . phentermine 15 MG capsule Take 1 capsule (15 mg total) by mouth every morning.   . topiramate (TOPAMAX) 25 MG tablet Take 3 tablets (75 mg total) by mouth daily.   Marland Kitchen  vitamin C (ASCORBIC ACID) 500 MG tablet Take 500 mg by mouth daily.   . [DISCONTINUED] phentermine 15 MG capsule Take 1 capsule (15 mg total) by mouth every morning.   . cyclobenzaprine (FLEXERIL) 5 MG tablet as needed. Reported on 09/02/2015 02/14/2014: Takes prn back pain (last rx'd by Dr. Leo Grosser; also helps with endometriosis pain)  . vitamin B-12 (CYANOCOBALAMIN) 1000 MCG tablet Take 1,000 mcg by mouth daily.      No facility-administered encounter medications on file as of 01/06/2017.    ROS:  No headaches, rare dizziness (improved). No URI symptoms, chest pain, palpitations.  Breathing at baseline.  No nausea, vomiting,  bowel changes. Moods stable. See HPI.  PHYSICAL EXAM:  BP 122/70 (BP Location: Right Arm, Patient Position: Sitting, Cuff Size: Normal)   Pulse 80   Ht 5' 4"  (1.626 m)   Wt 234 lb (106.1 kg)   LMP 02/09/2009 (Exact Date)   BMI 40.17 kg/m   Wt Readings from Last 3 Encounters:  01/06/17 234 lb (106.1 kg)  09/30/16 234 lb (106.1 kg)  08/05/16 244 lb 12.8 oz (111 kg)   Pleasant ,well appearing female in no distress HEENT: conjunctiva and sclera are clear, EOMI Neck: No lymphadenopathy, thyromegaly or mass Heart: regular rate and rhythm Lungs: clear bilaterally Back: no CVA or spinal tenderness, no muscle spasm Abdomen: soft, nontender, no mass Extremities: no edema Skin: Large ecchymosis at left lateral arm (due to recent blood draw--moved arm while donating). Psych: normal mood, affect, hygiene and grooming, eye contact and speech  ASSESSMENT/PLAN:  Essential hypertension - well controlled  BMI 40.0-44.9, adult ([place]) - Continue phentermine/topiramate. Discussed exercises she may tolerate; continue healthier diet, snacks now that routine is more normal - Plan: phentermine 15 MG capsule  Depression, major, in remission [facility]) - consider grief counseling, prepare for difficult times (b-days, holidays); continue current med regimen    F/u as scheduled in September

## 2017-01-06 ENCOUNTER — Ambulatory Visit (INDEPENDENT_AMBULATORY_CARE_PROVIDER_SITE_OTHER): Payer: BLUE CROSS/BLUE SHIELD | Admitting: Family Medicine

## 2017-01-06 ENCOUNTER — Encounter: Payer: Self-pay | Admitting: Family Medicine

## 2017-01-06 VITALS — BP 122/70 | HR 80 | Ht 64.0 in | Wt 234.0 lb

## 2017-01-06 DIAGNOSIS — Z6841 Body Mass Index (BMI) 40.0 and over, adult: Secondary | ICD-10-CM | POA: Diagnosis not present

## 2017-01-06 DIAGNOSIS — F325 Major depressive disorder, single episode, in full remission: Secondary | ICD-10-CM | POA: Diagnosis not present

## 2017-01-06 DIAGNOSIS — I10 Essential (primary) hypertension: Secondary | ICD-10-CM

## 2017-01-06 MED ORDER — PHENTERMINE HCL 15 MG PO CAPS: 15.0000 mg | ORAL_CAPSULE | ORAL | 2 refills | Status: DC

## 2017-01-06 NOTE — Patient Instructions (Signed)
Continue current medications. Try and get 150 minutes of aerobic exercise each week, even if in 10-15 minute intervals, and as directed by back doctors.  Consider grief counseling if struggling at all.  Anticipate his birthday and have a plan with your sister.

## 2017-01-07 ENCOUNTER — Encounter: Payer: Self-pay | Admitting: Family Medicine

## 2017-01-08 DIAGNOSIS — M47816 Spondylosis without myelopathy or radiculopathy, lumbar region: Secondary | ICD-10-CM | POA: Diagnosis not present

## 2017-01-08 DIAGNOSIS — M2560 Stiffness of unspecified joint, not elsewhere classified: Secondary | ICD-10-CM | POA: Diagnosis not present

## 2017-01-08 DIAGNOSIS — M545 Low back pain: Secondary | ICD-10-CM | POA: Diagnosis not present

## 2017-01-11 DIAGNOSIS — M2560 Stiffness of unspecified joint, not elsewhere classified: Secondary | ICD-10-CM | POA: Diagnosis not present

## 2017-01-11 DIAGNOSIS — M47816 Spondylosis without myelopathy or radiculopathy, lumbar region: Secondary | ICD-10-CM | POA: Diagnosis not present

## 2017-01-11 DIAGNOSIS — M545 Low back pain: Secondary | ICD-10-CM | POA: Diagnosis not present

## 2017-01-13 DIAGNOSIS — M2560 Stiffness of unspecified joint, not elsewhere classified: Secondary | ICD-10-CM | POA: Diagnosis not present

## 2017-01-13 DIAGNOSIS — M545 Low back pain: Secondary | ICD-10-CM | POA: Diagnosis not present

## 2017-01-13 DIAGNOSIS — M47816 Spondylosis without myelopathy or radiculopathy, lumbar region: Secondary | ICD-10-CM | POA: Diagnosis not present

## 2017-01-19 DIAGNOSIS — M47816 Spondylosis without myelopathy or radiculopathy, lumbar region: Secondary | ICD-10-CM | POA: Diagnosis not present

## 2017-01-19 DIAGNOSIS — M2560 Stiffness of unspecified joint, not elsewhere classified: Secondary | ICD-10-CM | POA: Diagnosis not present

## 2017-01-19 DIAGNOSIS — M545 Low back pain: Secondary | ICD-10-CM | POA: Diagnosis not present

## 2017-01-21 DIAGNOSIS — M2560 Stiffness of unspecified joint, not elsewhere classified: Secondary | ICD-10-CM | POA: Diagnosis not present

## 2017-01-21 DIAGNOSIS — M545 Low back pain: Secondary | ICD-10-CM | POA: Diagnosis not present

## 2017-01-21 DIAGNOSIS — M47816 Spondylosis without myelopathy or radiculopathy, lumbar region: Secondary | ICD-10-CM | POA: Diagnosis not present

## 2017-01-27 DIAGNOSIS — M2560 Stiffness of unspecified joint, not elsewhere classified: Secondary | ICD-10-CM | POA: Diagnosis not present

## 2017-01-27 DIAGNOSIS — M545 Low back pain: Secondary | ICD-10-CM | POA: Diagnosis not present

## 2017-01-27 DIAGNOSIS — M47816 Spondylosis without myelopathy or radiculopathy, lumbar region: Secondary | ICD-10-CM | POA: Diagnosis not present

## 2017-01-29 DIAGNOSIS — M545 Low back pain: Secondary | ICD-10-CM | POA: Diagnosis not present

## 2017-01-29 DIAGNOSIS — M47816 Spondylosis without myelopathy or radiculopathy, lumbar region: Secondary | ICD-10-CM | POA: Diagnosis not present

## 2017-01-29 DIAGNOSIS — M2560 Stiffness of unspecified joint, not elsewhere classified: Secondary | ICD-10-CM | POA: Diagnosis not present

## 2017-02-08 ENCOUNTER — Other Ambulatory Visit: Payer: Self-pay | Admitting: Family Medicine

## 2017-02-08 DIAGNOSIS — M659 Synovitis and tenosynovitis, unspecified: Secondary | ICD-10-CM

## 2017-02-08 NOTE — Telephone Encounter (Signed)
Is this okay to refill? 

## 2017-02-09 DIAGNOSIS — M47816 Spondylosis without myelopathy or radiculopathy, lumbar region: Secondary | ICD-10-CM | POA: Diagnosis not present

## 2017-02-09 DIAGNOSIS — M2560 Stiffness of unspecified joint, not elsewhere classified: Secondary | ICD-10-CM | POA: Diagnosis not present

## 2017-02-09 DIAGNOSIS — M545 Low back pain: Secondary | ICD-10-CM | POA: Diagnosis not present

## 2017-02-12 DIAGNOSIS — M545 Low back pain: Secondary | ICD-10-CM | POA: Diagnosis not present

## 2017-02-12 DIAGNOSIS — M2560 Stiffness of unspecified joint, not elsewhere classified: Secondary | ICD-10-CM | POA: Diagnosis not present

## 2017-02-12 DIAGNOSIS — M47816 Spondylosis without myelopathy or radiculopathy, lumbar region: Secondary | ICD-10-CM | POA: Diagnosis not present

## 2017-02-15 ENCOUNTER — Encounter: Payer: Self-pay | Admitting: Family Medicine

## 2017-02-15 ENCOUNTER — Encounter: Payer: Self-pay | Admitting: Internal Medicine

## 2017-02-15 ENCOUNTER — Ambulatory Visit (INDEPENDENT_AMBULATORY_CARE_PROVIDER_SITE_OTHER): Payer: BLUE CROSS/BLUE SHIELD | Admitting: Internal Medicine

## 2017-02-15 VITALS — BP 126/72 | HR 93 | Ht 64.0 in | Wt 236.2 lb

## 2017-02-15 DIAGNOSIS — J4540 Moderate persistent asthma, uncomplicated: Secondary | ICD-10-CM | POA: Diagnosis not present

## 2017-02-15 LAB — NITRIC OXIDE: Nitric Oxide: 26

## 2017-02-15 NOTE — Addendum Note (Signed)
Addended by: Collier Salina on: 02/15/2017 12:14 PM   Modules accepted: Orders

## 2017-02-15 NOTE — Progress Notes (Signed)
Subjective:     Patient ID: Susan Gould, female   DOB: 07-16-1965, 51 y.o.   MRN: [id number]  HPI  V 05/10/2015  Chief Complaint  Patient presents with  . Follow-up    Pt states her breathing is doing well. Pt c/o mild prod cough. Pt states overall she does not have any complaints.    51 year old obese  patient with asthma . She used tpo be followed by Dr Joya Gaskins for asthma.  Now transfer of care to Dr Chase Caller  - Reports life loing hx of asthma. Moved from [place] to [place] in the 80s. Last asthma exac > 25 years ago. Does not remoember last prednisone. This is routine 6 month fu. Last visit April 2016. Since then compliant as always with dulera 2 puff bid. Never used alb rescue. ACQ score is 0 out of 5 showing grood control Exhaled NO is 32 and above 25 and so slightly high but in grey zone.   - Specifically she does not wake up at night because of asthma. When she wakes up she is asymptomatic. She is not limited in her activities in the daytime because of asthma. She does not have any shortness of breath because of asthma. No wheezing because of asthma. No albuterol rescue use. Review of medications shows she is on ACE inhibitor but she does not have a cough  OV 03/03/2016  Chief Complaint  Patient presents with  . Follow-up    Says she is doing better, no coughing or sob or chest pain   FU mod persistent asthma  - doing well. Had knee surgery without problems. Asthma control questionnaire shows excellent control with score of 0 out of 5. She does not wake up in the middle of the night with asthma symptoms. When she wakes up she does not have any asthma symptoms. Activities are not limited because of asthma. She does not expands any shortness of breath or wheezing because of asthma. She only uses her Dulera one puff once daily and she uses it 2 times a month for rescue. However she is hesitant to down scale to inhaled steroid alone because she has several supply samples of Dulera. She has  not had flu shot but will have it today.   OV 06/02/2016  Chief Complaint  Patient presents with  . Follow-up    Pt c/o sinus congestion and dry cough, productive cough  in mornings with yellow mucus. Pt c/o slight increase in SOB. Pt denies CP/tightness and f/c/s.     51 year old female with moderate persistent asthma  3 month follow-up. At this visit he was supposed to be escalate asthma therapy given her continued good health. However she is reporting for the last 1 month because of the weather change in the fall season she's having increased sinus drainage and increased cough and shortness of breath but not necessarily increased wheezing or albuterol rescue use or sputum or fever. She does have increased chest tightness. The [facility] seems to help. However she is only using 1 puff twice daily. She is in agreement for some prednisone therapy and escalating her maintenance Dulera dosage   OV 02/15/2017  Chief Complaint  Patient presents with  . Follow-up    Pt states she feels at baseline. Pt states she has a mild cough that she attributes to reflux. Pt denies CP/tightness and f/c/s.     51 year old female doing well with moderate persistent asthma. At this point in time she's cut down her Dulera to 1  puff once daily. Asthma control questionnaire is 0. Is not using albuterol for rescue. She is not waking up in the middle of the night with symptoms. When she wakes up in the morning she does not have any symptoms. No shortness of breath and wheezing or cough or chest tightness. She plans to get a flu shot. Of note, 4 days ago she swallowed hydrochlorothiazide pill and she thinks this is stuck in her throat   Asthma Control Panel - normal spiro feb 2015 -IgE - none done - cxr 2001 - clear - positive allergy testing by history greater than 25 years ago - eos 200cells march2017 05/10/2015  03/03/2016  02/15/2017   Current Med Regimen dulera dulera - takes it 1 puff daily + prn 2 times a  month dulera 1 puff once daily  ACQ 5 point- 1 week. wtd avg score. <1.0 is good control 0.75-1.25 is grey zone. >1.25 poor control. Delta 0.5 is clinically meaningful ZERO 0 0  FeNO ppB 32ppb  26ppb  FeV1  x x   Planned intervention  for visit contininue dulera, change ace to arb Cont dulera 1 puff twice daily        has a past medical history of Allergic rhinitis, cause unspecified; Anxiety (2006); Asthma; Colon polyps (2007); Depression (2006); Endometriosis; Family history of breast cancer in female; GERD (gastroesophageal reflux disease); Hemorrhoid; Hyperlipidemia; Hypertension; Insulin resistance; Medial meniscus tear; Obesity; and OSA (obstructive sleep apnea).   reports that she has never smoked. She has never used smokeless tobacco.  Past Surgical History:  Procedure Laterality Date  . CHOLECYSTECTOMY  1986  . FINGER GANGLION CYST EXCISION  2003   RIGHT 3rd finger  . KNEE SURGERY  '95 and '97   BILATERAL KNEE  . LAPAROSCOPY  '98, '01   endometriosis (Dr. Leo Grosser)  . LASIK  2000  . tibial tuberoplasty  90's   left    Allergies  Allergen Reactions  . Meloxicam Itching    Immunization History  Administered Date(s) Administered  . Influenza Split 04/17/2012  . Influenza Whole 03/29/2009, 03/17/2010, 04/23/2011  . Influenza,inj,Quad PF,36+ Mos 03/07/2013, 02/14/2014, 02/21/2015, 03/03/2016  . Pneumococcal Polysaccharide-23 03/07/2013  . Td 08/14/2004  . Tdap 07/29/2011  . Zoster Recombinat (Shingrix) 10/28/2016    Family History  Problem Relation Age of Onset  . Diabetes Mother   . Hypertension Mother   . Hyperlipidemia Mother   . Arthritis Mother   . Cancer Mother 66       breast cancer  . Breast cancer Mother 41  . Diabetes Father   . Hyperlipidemia Father   . Hypertension Father   . Asthma Father   . Arthritis Father   . Heart disease Father        CHF, hypokinesis  . COPD Father        due to asbestos exposure; former smoker  . Pulmonary embolism  Father   . Parkinson's disease Father   . Hypertension Sister   . Colon cancer Maternal Grandmother        late 37's  . Cancer Maternal Grandmother        colon  . Heart disease Maternal Grandmother        MI in 28's  . Breast cancer Paternal Aunt        21's  . Cancer Paternal Aunt        breast  . Heart disease Paternal Uncle   . Diabetes Paternal Uncle   . Kidney disease Paternal Uncle  Current Outpatient Prescriptions:  .  ALPRAZolam (XANAX) 0.5 MG tablet, TAKE 1/2 -1 TABLET BY MOUTH 3 TIMES DAILY AS NEEDED FOR ANXIETY, Disp: 20 tablet, Rfl: 0 .  atorvastatin (LIPITOR) 40 MG tablet, Take 1 tablet (40 mg total) by mouth daily., Disp: 30 tablet, Rfl: 5 .  buPROPion (WELLBUTRIN XL) 300 MG 24 hr tablet, Take 1 tablet (300 mg total) by mouth daily., Disp: 30 tablet, Rfl: 5 .  Calcium Carbonate-Vitamin D (CALCIUM + D) 600-200 MG-UNIT TABS, Take 2 tablets by mouth daily.  , Disp: , Rfl:  .  cetirizine (ZYRTEC) 10 MG tablet, Take 10 mg by mouth daily.  , Disp: , Rfl:  .  citalopram (CELEXA) 40 MG tablet, Take 1 tablet (40 mg total) by mouth daily., Disp: 30 tablet, Rfl: 5 .  cyclobenzaprine (FLEXERIL) 5 MG tablet, as needed. Reported on 09/02/2015, Disp: , Rfl:  .  diclofenac (VOLTAREN) 75 MG EC tablet, TAKE 1 TABLET BY MOUTH TWICE DAILY, Disp: 60 tablet, Rfl: 0 .  Diclofenac Sodium (PENNSAID) 1.5 % SOLN, Place 1 each onto the skin 2 (two) times daily., Disp: , Rfl:  .  fish oil-omega-3 fatty acids 1000 MG capsule, Take 3 g by mouth daily. , Disp: , Rfl:  .  flintstones complete (FLINTSTONES) 60 MG chewable tablet, Chew 1 tablet by mouth daily., Disp: , Rfl:  .  fluticasone (FLONASE) 50 MCG/ACT nasal spray, INSTILL 1 TO 2 SPRAYS INTO EACH NOSTRIL ONCE A DAY, Disp: 16 g, Rfl: 11 .  hydrochlorothiazide (HYDRODIURIL) 25 MG tablet, Take 1 tablet (25 mg total) by mouth daily., Disp: 30 tablet, Rfl: 5 .  losartan (COZAAR) 25 MG tablet, Take 1 tablet (25 mg total) by mouth daily., Disp: 30  tablet, Rfl: 5 .  metFORMIN (GLUCOPHAGE-XR) 500 MG 24 hr tablet, take 2 tablets by mouth every morning and 1 tablet every evening, Disp: 90 tablet, Rfl: 5 .  mometasone-formoterol (DULERA) 100-5 MCG/ACT AERO, Inhale 2 puffs into the lungs 2 (two) times daily., Disp: 1 Inhaler, Rfl: 6 .  Multiple Vitamins-Minerals (MULTIVITAMIN WITH MINERALS) tablet, Take 1 tablet by mouth daily.  , Disp: , Rfl:  .  nystatin (MYCOSTATIN/NYSTOP) powder, Apply 1 g topically 2 (two) times daily. , Disp: , Rfl: 0 .  omeprazole (PRILOSEC) 20 MG capsule, Take 20 mg by mouth daily.  , Disp: , Rfl:  .  phentermine 15 MG capsule, Take 1 capsule (15 mg total) by mouth every morning., Disp: 30 capsule, Rfl: 2 .  topiramate (TOPAMAX) 25 MG tablet, Take 3 tablets (75 mg total) by mouth daily., Disp: 90 tablet, Rfl: 5 .  vitamin B-12 (CYANOCOBALAMIN) 1000 MCG tablet, Take 1,000 mcg by mouth daily.  , Disp: , Rfl:  .  vitamin C (ASCORBIC ACID) 500 MG tablet, Take 500 mg by mouth daily., Disp: , Rfl:    Review of Systems     Objective:   Physical Exam  Constitutional: She is oriented to person, place, and time. She appears well-developed and well-nourished. No distress.  HENT:  Head: Normocephalic and atraumatic.  Right Ear: External ear normal.  Left Ear: External ear normal.  Mouth/Throat: Oropharynx is clear and moist. No oropharyngeal exudate.  Eyes: Pupils are equal, round, and reactive to light. Conjunctivae and EOM are normal. Right eye exhibits no discharge. Left eye exhibits no discharge. No scleral icterus.  Neck: Normal range of motion. Neck supple. No JVD present. No tracheal deviation present. No thyromegaly present.  Cardiovascular: Normal rate, regular rhythm, normal  heart sounds and intact distal pulses.  Exam reveals no gallop and no friction rub.   No murmur heard. Pulmonary/Chest: Effort normal and breath sounds normal. No respiratory distress. She has no wheezes. She has no rales. She exhibits no  tenderness.  Abdominal: Soft. Bowel sounds are normal. She exhibits no distension and no mass. There is no tenderness. There is no rebound and no guarding.  Musculoskeletal: Normal range of motion. She exhibits no edema or tenderness.  Lymphadenopathy:    She has no cervical adenopathy.  Neurological: She is alert and oriented to person, place, and time. She has normal reflexes. No cranial nerve deficit. She exhibits normal muscle tone. Coordination normal.  Skin: Skin is warm and dry. No rash noted. She is not diaphoretic. No erythema. No pallor.  Psychiatric: She has a normal mood and affect. Her behavior is normal. Judgment and thought content normal.  Vitals reviewed.  Vitals:   02/15/17 0857  BP: 126/72  Pulse: 93  SpO2: 98%  Weight: 236 lb 3.2 oz (107.1 kg)  Height: 5' 4"  (1.626 m)    Estimated body mass index is 40.54 kg/m as calculated from the following:   Height as of this encounter: 5' 4"  (1.626 m).   Weight as of this encounter: 236 lb 3.2 oz (107.1 kg).     Assessment:       ICD-10-CM   1. Moderate persistent asthma in adult without complication P37.90        Plan:      Asthma is well controlled  Do not see a foreign body in throat  - if irritation persists see ENT Continue dulera 1 puff daily Use albuterol as needed Flu shot in fall  ROV 9 months or sooner if needed   Dr. Brand Males, M.D., [facility].C.P Pulmonary and Critical Care Medicine Staff Physician [facility] Pager: [phone], If no answer or between  15:00h - 7:00h: call [phone]  02/15/2017 9:31 AM   \

## 2017-02-15 NOTE — Patient Instructions (Signed)
ICD-10-CM   1. Moderate persistent asthma in adult without complication G92.01     Asthma is well controlled  Do not see a foreign body in throat  - if irritation persists see ENT Continue dulera 1 puff daily Use albuterol as needed Flu shot in fall  ROV 9 months or sooner if needed

## 2017-02-16 DIAGNOSIS — M5126 Other intervertebral disc displacement, lumbar region: Secondary | ICD-10-CM | POA: Diagnosis not present

## 2017-02-16 DIAGNOSIS — M47816 Spondylosis without myelopathy or radiculopathy, lumbar region: Secondary | ICD-10-CM | POA: Diagnosis not present

## 2017-02-16 DIAGNOSIS — M545 Low back pain: Secondary | ICD-10-CM | POA: Diagnosis not present

## 2017-02-17 DIAGNOSIS — M2560 Stiffness of unspecified joint, not elsewhere classified: Secondary | ICD-10-CM | POA: Diagnosis not present

## 2017-02-17 DIAGNOSIS — M545 Low back pain: Secondary | ICD-10-CM | POA: Diagnosis not present

## 2017-02-17 DIAGNOSIS — M47816 Spondylosis without myelopathy or radiculopathy, lumbar region: Secondary | ICD-10-CM | POA: Diagnosis not present

## 2017-02-22 DIAGNOSIS — M2560 Stiffness of unspecified joint, not elsewhere classified: Secondary | ICD-10-CM | POA: Diagnosis not present

## 2017-02-22 DIAGNOSIS — M47816 Spondylosis without myelopathy or radiculopathy, lumbar region: Secondary | ICD-10-CM | POA: Diagnosis not present

## 2017-02-22 DIAGNOSIS — M545 Low back pain: Secondary | ICD-10-CM | POA: Diagnosis not present

## 2017-02-23 ENCOUNTER — Ambulatory Visit (INDEPENDENT_AMBULATORY_CARE_PROVIDER_SITE_OTHER): Payer: BLUE CROSS/BLUE SHIELD | Admitting: Internal Medicine

## 2017-02-23 ENCOUNTER — Encounter: Payer: Self-pay | Admitting: Internal Medicine

## 2017-02-23 DIAGNOSIS — E6609 Other obesity due to excess calories: Secondary | ICD-10-CM

## 2017-02-23 DIAGNOSIS — IMO0001 Reserved for inherently not codable concepts without codable children: Secondary | ICD-10-CM

## 2017-02-23 DIAGNOSIS — Z6841 Body Mass Index (BMI) 40.0 and over, adult: Secondary | ICD-10-CM | POA: Diagnosis not present

## 2017-02-23 DIAGNOSIS — G4733 Obstructive sleep apnea (adult) (pediatric): Secondary | ICD-10-CM

## 2017-02-23 NOTE — Progress Notes (Signed)
12/11/2015-51 year old female never smoker followed for OSA, complicated by obesity, asthma, GERD, HBP NPSG 04/23/06- AHI 42/ hr, desat to 89% Followed by Dr. Chase Caller for asthma Former [place] patient; DME [facility]; Wears CPAP nightly and DL attached.  CPAP/auto 5-20 [facility] Download confirms excellent compliance 99%/4 hours, excellent control AHI 1.8/hour. Very comfortable with CPAP indefinitely sleeping better. Nasal pillows mask.  02/23/17 - 51 year old female never smoker followed for OSA, complicated by obesity, GERD, HBP Followed by Dr. Chase Caller for asthma CPAP auto 5-20/[facility] FOLLOWS FOR: DME [facility]. DL attached and wears CPAP nightly. No new supplies needed at this time.  She says she is doing "great" with her CPAP, very comfortable. The thought of sleeping without it makes her anxious. Download 100% compliance averaging over 8 hours per night. AHI 1.3/hour. No recent asthma or other respiratory discomforts.  ROS-see HPI   + = positive Constitutional:    weight loss, night sweats, fevers, chills, fatigue, lassitude. HEENT:    headaches, difficulty swallowing, tooth/dental problems, sore throat,       sneezing, itching, ear ache, nasal congestion, post nasal drip, snoring CV:    chest pain, orthopnea, PND, swelling in lower extremities, anasarca,                                                       dizziness, palpitations Resp:   shortness of breath with exertion or at rest.                productive cough,   non-productive cough, coughing up of blood.              change in color of mucus.  wheezing.   Skin:    rash or lesions. GI:  No-   heartburn, indigestion, abdominal pain, nausea, vomiting, diarrhea,                 change in bowel habits, loss of appetite GU: dysuria, change in color of urine, no urgency or frequency.   flank pain. MS:   joint pain, stiffness, decreased range of motion, back pain. Neuro-     nothing  unusual Psych:  change in mood or affect.  depression or anxiety.   memory loss.  OBJ- Physical Exam General- Alert, Oriented, Affect-appropriate, Distress- none acute + obese Skin- rash-none, lesions- none, excoriation- none Lymphadenopathy- none Head- atraumatic            Eyes- Gross vision intact, PERRLA, conjunctivae and secretions clear            Ears- Hearing, canals-normal            Nose- Clear, no-Septal dev, mucus, polyps, erosion, perforation             Throat- Mallampati III-IV , mucosa clear , drainage- none, tonsils- atrophic Neck- flexible , trachea midline, no stridor , thyroid nl, carotid no bruit Chest - symmetrical excursion , unlabored           Heart/CV- RRR , no murmur , no gallop  , no rub, nl s1 s2                           - JVD- none , edema- none, stasis changes- none, varices- none  Lung- clear to P&A, wheeze- none, cough- none , dullness-none, rub- none           Chest wall-  Abd-  Br/ Gen/ Rectal- Not done, not indicated Extrem- cyanosis- none, clubbing, none, atrophy- none, strength- nl Neuro- grossly intact to observation

## 2017-02-23 NOTE — Assessment & Plan Note (Signed)
Download confirms good compliance and control. She uses it every night. We discussed keeping her old machine as a spare. Current machine is about 53 years old. Using nasal pillows. We are updating documentation with no changes required.

## 2017-02-23 NOTE — Patient Instructions (Signed)
We can continue CPAP auto 5-20, mask of choice, humidifier, supplies, Airview     Dx OSA   Please call if we can help

## 2017-02-23 NOTE — Assessment & Plan Note (Signed)
Her weight certainly impacts her obstructive sleep apnea. A serious weight loss effort and possibly bariatric referral available.

## 2017-02-26 ENCOUNTER — Ambulatory Visit (HOSPITAL_COMMUNITY)
Admission: EM | Admit: 2017-02-26 | Discharge: 2017-02-26 | Disposition: A | Discharge status: Home / Self Care | Payer: BLUE CROSS/BLUE SHIELD

## 2017-02-26 ENCOUNTER — Encounter (HOSPITAL_COMMUNITY): Payer: Self-pay | Admitting: Emergency Medicine

## 2017-02-26 DIAGNOSIS — H43391 Other vitreous opacities, right eye: Secondary | ICD-10-CM

## 2017-02-26 NOTE — ED Triage Notes (Signed)
Pt c/o floater on right eye onset today.... Denies visual disturbance or pain  Denies inj/trauma.... A&O x4... NAD... Ambulatory

## 2017-02-26 NOTE — Discharge Instructions (Signed)
Call Dr. Cher Nakai office [phone] for an appointment next week

## 2017-02-26 NOTE — ED Provider Notes (Signed)
[facility]   [id number] 02/26/17 Arrival Time: 2637  ASSESSMENT & PLAN:  1. Floaters in visual field, right     No orders of the defined types were placed in this encounter.  Follow up with Dr. Cher Nakai next week Reviewed expectations re: course of current medical issues. Questions answered. Outlined signs and symptoms indicating need for more acute intervention. Patient verbalized understanding. After Visit Summary given.   SUBJECTIVE:  Susan Gould is a 51 y.o. female who presents with complaint of floaters right eye  ROS: As per HPI.   OBJECTIVE:  Vitals:   02/26/17 1628  BP: (!) 122/55  Pulse: 81  Resp: 16  Temp: 98.9 F (37.2 C)  TempSrc: Oral  SpO2: 99%    General appearance: alert; no distress Eyes: PERRLA; EOMI; conjunctiva normal HENT: normocephalic; atraumatic; TMs normal; nasal mucosa normal; oral mucosa normal Neck: supple Lungs: clear to auscultation bilaterally Heart: regular rate and rhythm Abdomen: soft, non-tender; bowel sounds normal; no masses or organomegaly; no guarding or rebound tenderness Back: no CVA tenderness Extremities: no cyanosis or edema; symmetrical with no gross deformities Skin: warm and dry Neurologic: normal gait; normal symmetric reflexes Psychological: alert and cooperative; normal mood and affect  Labs: Results for orders placed or performed in visit on 02/15/17  Nitric oxide  Result Value Ref Range   Nitric Oxide 26    Labs Reviewed - No data to display  Imaging: No results found.  Allergies  Allergen Reactions  . Meloxicam Itching    Past Medical History:  Diagnosis Date  . Allergic rhinitis, cause unspecified    cat/dog/ragweed/mold/wool, s/p immunotherapy x 3-4 yrs  . Anxiety 2006  . Asthma    (Dr. Joya Gaskins)  . Colon polyps 2007   Dr. Oletta Lamas (she was told repeat in 10 years)  . Depression 2006  . Endometriosis    Dr. Leo Grosser  . Family history of breast cancer in female   . GERD  (gastroesophageal reflux disease)   . Hemorrhoid    internal and external  . Hyperlipidemia   . Hypertension   . Insulin resistance    Dr. Leo Grosser  . Medial meniscus tear    Left. Surgery [name].  NOT seen on MRI 06/2013  . Obesity   . OSA (obstructive sleep apnea)    on CPAP (Dr. Gwenette Greet)   Social History   Social History  . Marital status: Single    Spouse name: N/A  . Number of children: 0  . Years of education: N/A   Occupational History  . [employer]   Social History Main Topics  . Smoking status: Never Smoker  . Smokeless tobacco: Never Used  . Alcohol use Yes     Comment: 2-3 drinks a week, beer; drinking vodka more often (nightly) due to stress (03/2016)  . Drug use: No  . Sexual activity: No   Other Topics Concern  . Not on file   Social History Narrative   Lives alone, 2 cats.  Sister and father live in [place]   Family History  Problem Relation Age of Onset  . Diabetes Mother   . Hypertension Mother   . Hyperlipidemia Mother   . Arthritis Mother   . Cancer Mother 26       breast cancer  . Breast cancer Mother 78  . Diabetes Father   . Hyperlipidemia Father   . Hypertension Father   . Asthma Father   . Arthritis Father   . Heart  disease Father        CHF, hypokinesis  . COPD Father        due to asbestos exposure; former smoker  . Pulmonary embolism Father   . Parkinson's disease Father   . Hypertension Sister   . Colon cancer Maternal Grandmother        late 78's  . Cancer Maternal Grandmother        colon  . Heart disease Maternal Grandmother        MI in 44's  . Breast cancer Paternal Aunt        42's  . Cancer Paternal Aunt        breast  . Heart disease Paternal Uncle   . Diabetes Paternal Uncle   . Kidney disease Paternal Uncle    Past Surgical History:  Procedure Laterality Date  . CHOLECYSTECTOMY  1986  . FINGER GANGLION CYST EXCISION  2003   RIGHT 3rd finger  . KNEE SURGERY  '95 and '97    BILATERAL KNEE  . LAPAROSCOPY  '98, '01   endometriosis (Dr. Leo Grosser)  . LASIK  2000  . tibial tuberoplasty  90's   left     Lysbeth Penner, [place] 02/26/17 1735

## 2017-03-05 DIAGNOSIS — H43811 Vitreous degeneration, right eye: Secondary | ICD-10-CM | POA: Diagnosis not present

## 2017-03-08 ENCOUNTER — Encounter: Payer: Self-pay | Admitting: Family Medicine

## 2017-03-08 NOTE — Progress Notes (Signed)
Chief Complaint  Patient presents with  . Hypertension    fasting med check. Would like for you to look at a mole on her back that has been there for a couple of years that is now itching.    Went to UC recently with c/o floaters in R visual field. She followed up with eye doctor, report she had a bleed.  No tear or detachment, and vision is resolving.  Will f/u next week.  Obesity: Previously changed from Qsymia to generic topiramate and phentermine, and remains on this regimen. Altered taste continues, nausea resolved.  Not seeing recent weight loss, multifactorial. Had some more emotional eating after her father's death. She feels like grief has improved, but not eating more related to stress from work, long days. Trying to hire more people. Ice cream remains her stress food--switched to Halotop from [employer] (lower in calories). Eats it about 1-2x/week. Not eating much fast food.  She works from home, doesn't eat much during the very long days. Snacks on Kind bars instead of candy bars. Current exercise: she completed PT, learned what exercises to avoid, can't go back to boot camp--needs to avoid twisting/bending.  Plans to join [employer].  Back pain: She saw Dr. Lynann Bologna, had MRI, and completed physical therapy (Dr. Ron Agee); plans for injection if no response to PT. Holding off on injections until pain is worse.  PT helped some.  Has home regimen.  She continues to have pain at her right thumb. Injection by Dr. Lynann Bologna wasn't helpful. Didn't elect to have a second.  Thinks surgery may be needed if not improving. Plans to give it more time. Still painful, only worse when knitting.  Depression/anxiety: On Wellbutrin and citalopram. Denies side effects. Moods/grief have improved, but still under a lot of work stress. Still "has moments"--2 days ago was the anniversary of her mom's death.    Hasn't had time to drum. Not needing alprazolam. Last refilled  12/28//2017,#20.  Hypertension:  BP's remain well controlled on the 1/2 tablet of HCTZ. Dizziness very rare, much improved since decreasing the dose. Felt like a pill got stuck in her throat a few weeks ago.  It felt better after gargling for several days.  She finds it is almost completely resolved (only slight discomfort if she moves her tongue a certain way). Has home monitor, but hasn't been checking BP regularly. BP's have been normal at other offices.  Hyperlipidemia follow-up: Patient is reportedly following a low-fat, low cholesterol diet (ice cream as per above). Compliant with medications and denies medication side effects. She had admittedly been eating more ice cream ([employer]) prior to last check.--now changed to [employer]. Lab Results  Component Value Date   CHOL 177 09/30/2016   HDL 46 (L) 09/30/2016   LDLCALC 79 09/30/2016   TRIG 259 (H) 09/30/2016   CHOLHDL 3.8 09/30/2016    PCOS/insulin resistance: Compliant with metformin. Denies polydipsia, polyuria. She has some tingling in her feet (on both feet, between the great and 2nd toes (medially), ongoing, without change. Lab Results  Component Value Date   HGBA1C 5.8 09/30/2016   GERD--takes Prilosec less often--takes it when she knows something might trigger it, about every other day.  Sometimes she goes a couple of days, then will start coughing and be reminded to take the prilosec. Denies dysphagia.   OA--L knee. She had left unicompartmental knee arthroplasty by Dr. Mardelle Matte in April 2017. Still has some pain, achiness on rainy days, overall much  better.  Asthma and allergies--well controlled on her current regimen, Dulera 1 puff daily. Hasn't needed to use any albuterol. She is under the care of Dr. Chase Caller, last seen in August. She sees Dr. [name] for OSA, also seen in August. She is wearing her CPAP and doing well.  UTD with GYN and pap smears and mammogram. Past due for colonoscopy (last 2005 Dr. Oletta Lamas).    Taking iron supplements and Flinstones vitamins. She has been donating platelets and plasma. Sometimes she is unable to donate weekly, fine when she went back 3 weeks later was fine.She continues to take Flinstone and iron daily to help maintain/regenerate stores.  She had first Shingrix 10/28/16 at [facility].  On waiting list to get 2nd.  Wants mole on her back checked--slightly itchy, and is now raised.  PMH, PSH, SH reviewed  Outpatient Encounter Prescriptions as of 03/09/2017  Medication Sig Note  . atorvastatin (LIPITOR) 40 MG tablet Take 1 tablet (40 mg total) by mouth daily.   Marland Kitchen buPROPion (WELLBUTRIN XL) 300 MG 24 hr tablet Take 1 tablet (300 mg total) by mouth daily.   . Calcium Carbonate-Vitamin D (CALCIUM + D) 600-200 MG-UNIT TABS Take 2 tablets by mouth daily.     . cetirizine (ZYRTEC) 10 MG tablet Take 10 mg by mouth daily.     . citalopram (CELEXA) 40 MG tablet Take 1 tablet (40 mg total) by mouth daily.   . diclofenac (VOLTAREN) 75 MG EC tablet TAKE 1 TABLET BY MOUTH TWICE DAILY 03/09/2017: Uses on wrists  . Diclofenac Sodium (PENNSAID) 1.5 % SOLN Place 1 each onto the skin 2 (two) times daily.   . fish oil-omega-3 fatty acids 1000 MG capsule Take 3 g by mouth daily.    . flintstones complete (FLINTSTONES) 60 MG chewable tablet Chew 1 tablet by mouth daily.   . fluticasone (FLONASE) 50 MCG/ACT nasal spray INSTILL 1 TO 2 SPRAYS INTO EACH NOSTRIL ONCE A DAY   . hydrochlorothiazide (HYDRODIURIL) 25 MG tablet Take 1 tablet (25 mg total) by mouth daily. 01/06/2017: Taking 1/2 tablet daily  . losartan (COZAAR) 25 MG tablet Take 1 tablet (25 mg total) by mouth daily.   . metFORMIN (GLUCOPHAGE-XR) 500 MG 24 hr tablet take 2 tablets by mouth every morning and 1 tablet every evening   . mometasone-formoterol (DULERA) 100-5 MCG/ACT AERO Inhale 2 puffs into the lungs 2 (two) times daily. 03/09/2017: Uses 1 puff daily  . nystatin (MYCOSTATIN/NYSTOP) powder Apply 1 g topically 2 (two) times  daily.    Marland Kitchen omeprazole (PRILOSEC) 20 MG capsule Take 20 mg by mouth daily.   03/09/2017: Takes about qod  . phentermine 15 MG capsule Take 1 capsule (15 mg total) by mouth every morning.   . topiramate (TOPAMAX) 25 MG tablet Take 3 tablets (75 mg total) by mouth daily.   . vitamin B-12 (CYANOCOBALAMIN) 1000 MCG tablet Take 1,000 mcg by mouth daily.     . vitamin C (ASCORBIC ACID) 500 MG tablet Take 500 mg by mouth daily.   Marland Kitchen ALPRAZolam (XANAX) 0.5 MG tablet TAKE 1/2 -1 TABLET BY MOUTH 3 TIMES DAILY AS NEEDED FOR ANXIETY (Patient not taking: Reported on 03/09/2017)   . cyclobenzaprine (FLEXERIL) 5 MG tablet as needed. Reported on 09/02/2015 02/14/2014: Takes prn back pain (last rx'd by Dr. Leo Grosser; also helps with endometriosis pain)  . [DISCONTINUED] Multiple Vitamins-Minerals (MULTIVITAMIN WITH MINERALS) tablet Take 1 tablet by mouth daily.      No facility-administered encounter medications on file  as of 03/09/2017.    Iron tab once daily also  Allergies  Allergen Reactions  . Meloxicam Itching   ROS: no fever, chills, URI symptoms, headaches, dizziness.  Allergies/asthma controlled, no shortness of breath. No nausea, vomiting, bowel changes. +stress, intermittently feels sad, moods overall okay. No insomnia. See HPI for details   PHYSICAL EXAM:  BP 130/80 (BP Location: Left Arm, Patient Position: Sitting, Cuff Size: Normal)   Pulse 84   Ht 5' 4"  (1.626 m)   Wt 237 lb 3.2 oz (107.6 kg)   LMP 02/09/2009 (Exact Date)   BMI 40.72 kg/m   132/72 on repeat by MD  Wt Readings from Last 3 Encounters:  03/09/17 237 lb 3.2 oz (107.6 kg)  02/23/17 236 lb 3.2 oz (107.1 kg)  02/15/17 236 lb 3.2 oz (107.1 kg)   (gain of 3# since July)  Well developed, pleasant, morbidly obese female in no distress HEENT: EOMI, conjunctiva clear, EOMI. OP clear Neck: no lymphadenopathy or thyromegaly, no carotid bruit Heart: regular rate and rhythm, no murmur Lungs: clear bilaterally, no wheezes Abdomen:  obese, soft, nontender, no mass, no organomegaly  Back: no spinal or CVA tendeness Extremities: normal pulses, no edema.  Skin: no suspicious lesions, bruising, normal turgor. SK in mid-upper back. Neuro: alert and oriented, normal gait, cranial nerves, gait Psych: normal mood, affect, hygiene and grooming   Lab Results  Component Value Date   HGBA1C 5.5 03/09/2017   PHQ-9 score of 0  ASSESSMENT/PLAN:  Essential hypertension - controlled - Plan: Comprehensive metabolic panel, hydrochlorothiazide (MICROZIDE) 12.5 MG capsule  BMI 40.0-44.9, adult (HCC) - discussed diet, exercise, lack of response to current wt loss meds--prefers to continue. f/u 3 mos  Mixed hyperlipidemia - Plan: Comprehensive metabolic panel, Lipid panel  Insulin resistance syndrome - stable; continue metformin. Wt loss and regular exercise encouraged, as well as low sugar diet - Plan: HgB A1c  Depression, major, in remission (HCC)  Gastroesophageal reflux disease, esophagitis presence not specified  OSA (obstructive sleep apnea) - compliant with CPAP use  Moderate persistent asthma in adult without complication - well controlled on current regimen.   Medication monitoring encounter - Plan: Comprehensive metabolic panel, Lipid panel  Need for influenza vaccination - Plan: Flu Vaccine QUAD 6+ mos PF IM (Fluarix Quad PF)  Tenosynovitis of forearm - exacerbated with knitting. Prefers to hold off on surgery at this point. Continue diclofenac (topical, oral) - Plan: diclofenac (VOLTAREN) 75 MG EC tablet  Chronic low back pain, unspecified back pain laterality, with sciatica presence unspecified - improved--discussed importance of HEP from PT - Plan: diclofenac (VOLTAREN) 75 MG EC tablet  Need for shingles vaccine - Plan: Varicella-zoster vaccine IM (Shingrix)  Counseled re: risks/side effects to vaccines.  Lipid, A1c, c-met (no CBC, donating plasma regularly)   Colonoscopy past due--reminded to contact  GI to schedule (Dr. Oletta Lamas)  Briefly discussed Saxsenda, bariatric options. Prefers to continue with topiramate/phentermine (despite lack of recent results).  She has 1/2 bottles of all meds left, prefers NOT to have them refilled today (more of a hassle for her). Only Diclofenac refilled today  F/u 3 mos    Please block your calendar to allow for time for home exercise program for your back, and for time at the gym.  Keep it a priority, not just "if I have time".  Call Dr. Oletta Lamas [facility]) to schedule your colonoscopy

## 2017-03-09 ENCOUNTER — Ambulatory Visit (INDEPENDENT_AMBULATORY_CARE_PROVIDER_SITE_OTHER): Payer: BLUE CROSS/BLUE SHIELD | Admitting: Family Medicine

## 2017-03-09 ENCOUNTER — Encounter: Payer: Self-pay | Admitting: Family Medicine

## 2017-03-09 VITALS — BP 130/80 | HR 84 | Ht 64.0 in | Wt 237.2 lb

## 2017-03-09 DIAGNOSIS — G4733 Obstructive sleep apnea (adult) (pediatric): Secondary | ICD-10-CM | POA: Diagnosis not present

## 2017-03-09 DIAGNOSIS — Z6841 Body Mass Index (BMI) 40.0 and over, adult: Secondary | ICD-10-CM

## 2017-03-09 DIAGNOSIS — M545 Low back pain: Secondary | ICD-10-CM

## 2017-03-09 DIAGNOSIS — E782 Mixed hyperlipidemia: Secondary | ICD-10-CM | POA: Diagnosis not present

## 2017-03-09 DIAGNOSIS — I10 Essential (primary) hypertension: Secondary | ICD-10-CM | POA: Diagnosis not present

## 2017-03-09 DIAGNOSIS — J4540 Moderate persistent asthma, uncomplicated: Secondary | ICD-10-CM

## 2017-03-09 DIAGNOSIS — E8881 Metabolic syndrome: Secondary | ICD-10-CM

## 2017-03-09 DIAGNOSIS — K219 Gastro-esophageal reflux disease without esophagitis: Secondary | ICD-10-CM

## 2017-03-09 DIAGNOSIS — Z23 Encounter for immunization: Secondary | ICD-10-CM | POA: Diagnosis not present

## 2017-03-09 DIAGNOSIS — M659 Synovitis and tenosynovitis, unspecified: Secondary | ICD-10-CM

## 2017-03-09 DIAGNOSIS — Z5181 Encounter for therapeutic drug level monitoring: Secondary | ICD-10-CM

## 2017-03-09 DIAGNOSIS — G8929 Other chronic pain: Secondary | ICD-10-CM

## 2017-03-09 DIAGNOSIS — F325 Major depressive disorder, single episode, in full remission: Secondary | ICD-10-CM

## 2017-03-09 LAB — LIPID PANEL
CHOL/HDL RATIO: 4.3 (calc) (ref <5.0)
CHOLESTEROL: 187 mg/dL (ref <200)
HDL: 43 mg/dL — AB (ref >50)
LDL CHOLESTEROL (CALC): 103 mg/dL — AB
NON-HDL CHOLESTEROL (CALC): 144 mg/dL — AB (ref <130)
Triglycerides: 285 mg/dL — ABNORMAL HIGH (ref <150)

## 2017-03-09 LAB — COMPREHENSIVE METABOLIC PANEL
AG Ratio: 1.6 (calc) (ref 1.0–2.5)
ALBUMIN MSPROF: 4.5 g/dL (ref 3.6–5.1)
ALKALINE PHOSPHATASE (APISO): 100 U/L (ref 33–130)
ALT: 43 U/L — ABNORMAL HIGH (ref 6–29)
AST: 26 U/L (ref 10–35)
BUN: 22 mg/dL (ref 7–25)
CO2: 23 mmol/L (ref 20–32)
Calcium: 9.9 mg/dL (ref 8.6–10.4)
Chloride: 105 mmol/L (ref 98–110)
Creat: 0.97 mg/dL (ref 0.50–1.05)
GLOBULIN: 2.9 g/dL (ref 1.9–3.7)
GLUCOSE: 82 mg/dL (ref 65–99)
POTASSIUM: 4 mmol/L (ref 3.5–5.3)
Sodium: 138 mmol/L (ref 135–146)
TOTAL PROTEIN: 7.4 g/dL (ref 6.1–8.1)
Total Bilirubin: 0.3 mg/dL (ref 0.2–1.2)

## 2017-03-09 LAB — POCT GLYCOSYLATED HEMOGLOBIN (HGB A1C): HEMOGLOBIN A1C: 5.5

## 2017-03-09 MED ORDER — DICLOFENAC SODIUM 75 MG PO TBEC: 75.0000 mg | DELAYED_RELEASE_TABLET | Freq: Two times a day (BID) | ORAL | 5 refills | Status: DC

## 2017-03-09 MED ORDER — HYDROCHLOROTHIAZIDE 12.5 MG PO CAPS: 12.5000 mg | ORAL_CAPSULE | Freq: Every day | ORAL | 5 refills | Status: DC

## 2017-03-09 NOTE — Patient Instructions (Signed)
  Please block your calendar to allow for time for home exercise program for your back, and for time at the gym.  Keep it a priority, not just "if I have time".  Call Dr. Oletta Lamas [facility]) to schedule your colonoscopy

## 2017-03-10 ENCOUNTER — Encounter: Payer: Self-pay | Admitting: Family Medicine

## 2017-03-16 DIAGNOSIS — H43811 Vitreous degeneration, right eye: Secondary | ICD-10-CM | POA: Diagnosis not present

## 2017-03-31 ENCOUNTER — Other Ambulatory Visit: Payer: Self-pay | Admitting: Family Medicine

## 2017-03-31 DIAGNOSIS — E8881 Metabolic syndrome: Secondary | ICD-10-CM

## 2017-04-06 ENCOUNTER — Other Ambulatory Visit: Payer: Self-pay | Admitting: Family Medicine

## 2017-04-06 DIAGNOSIS — F325 Major depressive disorder, single episode, in full remission: Secondary | ICD-10-CM

## 2017-04-06 DIAGNOSIS — Z6841 Body Mass Index (BMI) 40.0 and over, adult: Secondary | ICD-10-CM

## 2017-04-06 DIAGNOSIS — F411 Generalized anxiety disorder: Secondary | ICD-10-CM

## 2017-04-07 NOTE — Telephone Encounter (Signed)
Is this okay to refill? 

## 2017-04-07 NOTE — Telephone Encounter (Signed)
[place] for 3 mos of phentermine and 6 months of citalopram

## 2017-04-13 ENCOUNTER — Other Ambulatory Visit: Payer: Self-pay | Admitting: Family Medicine

## 2017-04-13 DIAGNOSIS — I10 Essential (primary) hypertension: Secondary | ICD-10-CM

## 2017-04-13 DIAGNOSIS — F325 Major depressive disorder, single episode, in full remission: Secondary | ICD-10-CM

## 2017-04-21 ENCOUNTER — Other Ambulatory Visit: Payer: Self-pay | Admitting: Obstetrics and Gynecology

## 2017-04-21 DIAGNOSIS — Z139 Encounter for screening, unspecified: Secondary | ICD-10-CM

## 2017-04-26 ENCOUNTER — Other Ambulatory Visit: Payer: Self-pay | Admitting: Family Medicine

## 2017-04-27 NOTE — Telephone Encounter (Signed)
Is this okay to refill? 

## 2017-04-29 DIAGNOSIS — N809 Endometriosis, unspecified: Secondary | ICD-10-CM | POA: Diagnosis not present

## 2017-04-29 DIAGNOSIS — Z01419 Encounter for gynecological examination (general) (routine) without abnormal findings: Secondary | ICD-10-CM | POA: Diagnosis not present

## 2017-05-06 ENCOUNTER — Other Ambulatory Visit: Payer: Self-pay | Admitting: Family Medicine

## 2017-05-06 DIAGNOSIS — E8881 Metabolic syndrome: Secondary | ICD-10-CM

## 2017-05-24 ENCOUNTER — Ambulatory Visit
Admission: RE | Admit: 2017-05-24 | Discharge: 2017-05-24 | Disposition: A | Discharge status: Home / Self Care | Payer: BLUE CROSS/BLUE SHIELD | Source: Ambulatory Visit | Attending: Obstetrics and Gynecology | Admitting: Obstetrics and Gynecology

## 2017-05-24 DIAGNOSIS — Z1231 Encounter for screening mammogram for malignant neoplasm of breast: Secondary | ICD-10-CM | POA: Diagnosis not present

## 2017-05-24 DIAGNOSIS — Z139 Encounter for screening, unspecified: Secondary | ICD-10-CM

## 2017-06-01 NOTE — Progress Notes (Signed)
Chief Complaint  Patient presents with  . Diabetes    nonfasting med check (had breakfast and an apple at 1:00).     Patient presents for 3 month follow-up.  Sebaceous cyst below her right eye x years. It is growing and she wants it removed. Asking if she sees dermatologist, or other physician. She recently saw optometrist (no ophtho in that office), who doesn't treat this.  Obesity: Previously changed from Qsymia to generic topiramate and phentermine, and remains on this regimen. Altered taste continues, nausea resolved. Not seeing recent weight loss, multifactorial (emotional eating after father's death, followed by stress from work--understaffed).  At last visit she states ice cream was her stress food--switched to Halotop from [facility] (lower in calories). Eats it about 1-2x/week. Not eating much fast food.  She works from home, doesn't eat much during the very long days. Snacks on Kind bars instead of candy bars.  Update today: Work is much better, 40 hours/week.  [place] in [place], only been in "full swing" for the last 3 weeks.  Going 3-4x/week at lunchtime, 30-45 minutes, and longer on Sunday mornings.  Strength and circuit classes, and cardio. She really enjoys it.  She is very motivated.  "Sugar is a problem" Honey-nut Cheerios--limits to once/day (had been having twice), doesn't measure, feels guilty about it. No cookies, ice cream much less often (every 2 weeks, Halotop). There "hasn't been much green" in my diet. Cereal was easier, not motivated to cook. Typical dinner--rice, chicken, or a small frozen pizza. Has freezer full of frozen vegetables. Not motivated to eat them. Altered taste is a factor in not wanting certain foods.  She really doesn't want to go off the medication. Anxious about possibly changing the medications, and stress makes her go to food. Also admits to over-eating since Thanksgiving. Only exercising x 3 weeks. Wants to give it more  time.   Back pain: She saw Dr. Lynann Bologna, had MRI, and completed physical therapy (Dr. Ron Agee); plans for injection if no response to PT. Holding off on injections until pain is worse.  PT helped some.  Has home regimen. No longer doing home regimen, trying to work it into her routine at the gym.  Still having some pain, considering possible injection if not improving.  She continues to have pain at her right thumb, worse when knitting. Injection by Dr. Lance Sell helpful. Didn't elect to have a second. Thinks surgery may be needed if not improving. Not ready for surgery.  Depression/anxiety: On Wellbutrin and citalopram. Denies side effects. Work stress is better.  She has picked up her drumsticks (but is hard, feels like she doesn't have much rhythm). Continues to knit.  Sleep has been worse in the last couple of weeks, needed some xanax the last few nights to help with sleep. Last refilled 12/28//2017,#20.  Hypertension: BP's remain well controlled on the 1/2 tablet of HCTZ.  Has home monitor, but hasn't been checking BP regularly. BP's have been normal at other offices.  Hyperlipidemia follow-up: Patient is reportedly following a low-fat, low cholesterol diet. Compliant with medications and denies medication side effects. Prior to her last labs, she had admittedly been eating more ice cream.  She is not fasting today to recheck. Lab Results  Component Value Date   CHOL 187 03/09/2017   HDL 43 (L) 03/09/2017   LDLCALC 79 09/30/2016   TRIG 285 (H) 03/09/2017   CHOLHDL 4.3 03/09/2017   PCOS/insulin resistance: Compliant with metformin. Denies polydipsia, polyuria. She  has some tingling in her feet (on both feet, between the great and 2nd toes (medially), ongoing, without change.  PMH, PSH, SH reviewed  Outpatient Encounter Medications as of 06/02/2017  Medication Sig Note  . ALPRAZolam (XANAX) 0.5 MG tablet TAKE 1/2 -1 TABLET BY MOUTH 3 TIMES DAILY AS NEEDED FOR ANXIETY   .  atorvastatin (LIPITOR) 40 MG tablet Take 1 tablet (40 mg total) by mouth daily.   Marland Kitchen buPROPion (WELLBUTRIN XL) 300 MG 24 hr tablet TAKE 1 TABLET BY MOUTH EVERY DAY   . Calcium Carbonate-Vitamin D (CALCIUM + D) 600-200 MG-UNIT TABS Take 2 tablets by mouth daily.     . cetirizine (ZYRTEC) 10 MG tablet Take 10 mg by mouth daily.     . citalopram (CELEXA) 40 MG tablet TAKE 1 TABLET BY MOUTH ONCE DAILY   . diclofenac (VOLTAREN) 75 MG EC tablet Take 1 tablet (75 mg total) by mouth 2 (two) times daily.   . Diclofenac Sodium (PENNSAID) 1.5 % SOLN Place 1 each onto the skin 2 (two) times daily.   . Ferrous Sulfate (IRON) 325 (65 Fe) MG TABS Take 1 tablet by mouth daily.   . fish oil-omega-3 fatty acids 1000 MG capsule Take 3 g by mouth daily.    . flintstones complete (FLINTSTONES) 60 MG chewable tablet Chew 1 tablet by mouth daily.   . fluticasone (FLONASE) 50 MCG/ACT nasal spray INSTILL 1 TO 2 SPRAYS INTO EACH NOSTRIL ONCE A DAY   . hydrochlorothiazide (MICROZIDE) 12.5 MG capsule Take 1 capsule (12.5 mg total) by mouth daily.   Marland Kitchen losartan (COZAAR) 25 MG tablet TAKE 1 TABLET BY MOUTH ONCE DAILY   . metFORMIN (GLUCOPHAGE-XR) 500 MG 24 hr tablet TAKE 2 TABLETS BY MOUTH EVERY MORNING AND 1 TABLET EVERY EVENING   . mometasone-formoterol (DULERA) 100-5 MCG/ACT AERO Inhale 2 puffs into the lungs 2 (two) times daily. 03/09/2017: Uses 1 puff daily  . nystatin (MYCOSTATIN/NYSTOP) powder Apply 1 g topically 2 (two) times daily.    Marland Kitchen omeprazole (PRILOSEC) 20 MG capsule Take 20 mg by mouth daily.   03/09/2017: Takes about qod  . phentermine 15 MG capsule TAKE 1 CAPSULE BY MOUTH EVERY MORNING   . topiramate (TOPAMAX) 25 MG tablet TAKE 3 TABLETS BY MOUTH DAILY   . vitamin B-12 (CYANOCOBALAMIN) 1000 MCG tablet Take 1,000 mcg by mouth daily.     . vitamin C (ASCORBIC ACID) 500 MG tablet Take 500 mg by mouth daily.   . cyclobenzaprine (FLEXERIL) 5 MG tablet as needed. Reported on 09/02/2015 02/14/2014: Takes prn back pain  (last rx'd by Dr. Leo Grosser; also helps with endometriosis pain)   No facility-administered encounter medications on file as of 06/02/2017.    Allergies  Allergen Reactions  . Meloxicam Itching   ROS:  No fever, chills, URI symptoms, headaches, dizziness.  Moods are controlled.  Increased anxiety related to worrying about stopping weight loss meds, otherwise okay.  No chest pain, shortness of breath, GI complaints.  Joint pains as per HPI   PHYSICAL EXAM:  BP 122/78   Pulse 76   Ht _0  (1.626 m)   Wt 241 lb (109.3 kg)   LMP 02/09/2009 (Exact Date)   BMI 41.37 kg/m   Wt Readings from Last 3 Encounters:  06/02/17 241 lb (109.3 kg)  03/09/17 237 lb 3.2 oz (107.6 kg)  02/23/17 236 lb 3.2 oz (107.1 kg)   Lower left lateral eyelid, white papule, 82m; no surrounding erythema or soft tissue swelling Neck:  no lymphadenopathy, thyromegaly or mass Heart: regular rate and rhythm Lungs: clear bilaterally Back: no CVA tenderness Abdomen: soft, nontender, no mass Extremities: no edema Psych: normal mood, affect, hygiene, grooming, mildly anxious about any changes in meds. Normal eye contact, speech Neuro: alert and oriented, normal strength, gait, cranial nerves grossly intact.   Lab Results  Component Value Date   HGBA1C 5.2 06/02/2017     ASSESSMENT/PLAN:   Mixed hyperlipidemia - not fasting today; discussed diet--more fruits, vegetables, less carbs, sugar. Repeat lipids prior to next visit  BMI 40.0-44.9, adult ([place]) - motivated with exercise; reviewed diet, suggestions made to cut back carbs, increase veggies; Consider WW (doesn't llike MFP). cont meds for now  Depression, major, in remission ([place]) - continue current regimen  Insulin resistance syndrome - doing well--continue on metformin (due to PCOS, may help with weight, tolerating well) - Plan: HgB A1c  Essential hypertension - well controlled   F/u 3 months with fasting labs prior Lipids, c-met, TSH, CBC  40-45  min visit, more than 1/2 spent counseling (diet extensively, meds, stress reduction).   We will continue the current weight loss medications, hoping to see some weight losses with ongoing regular exercise and improved portion control and healthy eating. Please work on cutting back carb intake, and increasing fruits and vegetables, incorporating some into every meal. Consider weight watchers (vs restart MFP--I know you don't want to do this, so don't feel pressured).  Kirke Shaggy is the other weight loss medication that we discussed today. It is a daily injection.  It is a branded medication, that has a coupon (check online at [employer]) and we likely can get you a sample to try. This would be used in place of the current weight loss medications, if we aren't seeing weight loss.

## 2017-06-02 ENCOUNTER — Encounter: Payer: Self-pay | Admitting: Family Medicine

## 2017-06-02 ENCOUNTER — Ambulatory Visit: Payer: BLUE CROSS/BLUE SHIELD | Admitting: Family Medicine

## 2017-06-02 VITALS — BP 122/78 | HR 76 | Ht 64.0 in | Wt 241.0 lb

## 2017-06-02 DIAGNOSIS — E782 Mixed hyperlipidemia: Secondary | ICD-10-CM

## 2017-06-02 DIAGNOSIS — I10 Essential (primary) hypertension: Secondary | ICD-10-CM

## 2017-06-02 DIAGNOSIS — Z6841 Body Mass Index (BMI) 40.0 and over, adult: Secondary | ICD-10-CM | POA: Diagnosis not present

## 2017-06-02 DIAGNOSIS — E8881 Metabolic syndrome: Secondary | ICD-10-CM

## 2017-06-02 DIAGNOSIS — F325 Major depressive disorder, single episode, in full remission: Secondary | ICD-10-CM

## 2017-06-02 DIAGNOSIS — Z5181 Encounter for therapeutic drug level monitoring: Secondary | ICD-10-CM | POA: Diagnosis not present

## 2017-06-02 LAB — POCT GLYCOSYLATED HEMOGLOBIN (HGB A1C): HEMOGLOBIN A1C: 5.2

## 2017-06-02 NOTE — Patient Instructions (Signed)
  We will continue the current weight loss medications, hoping to see some weight losses with ongoing regular exercise and improved portion control and healthy eating. Please work on cutting back carb intake, and increasing fruits and vegetables, incorporating some into every meal. Consider weight watchers (vs restart MFP--I know you don't want to do this, so don't feel pressured).  Susan Gould is the other weight loss medication that we discussed today. It is a daily injection.  It is a branded medication, that has a coupon (check online at [employer] [place]) and we likely can get you a sample to try. This would be used in place of the current weight loss medications, if we aren't seeing weight loss.  Please take the lipitor (atorvastatin) every day.  Your triglycerides have been high (likely also related to sugar in the diet).  This medication also helps with the TG and the LDL cholesterol, so try harder to put it with your other night-time medications.

## 2017-06-03 ENCOUNTER — Other Ambulatory Visit: Payer: Self-pay | Admitting: Family Medicine

## 2017-06-03 DIAGNOSIS — E8881 Metabolic syndrome: Secondary | ICD-10-CM

## 2017-06-09 ENCOUNTER — Other Ambulatory Visit: Payer: Self-pay | Admitting: Family Medicine

## 2017-06-09 DIAGNOSIS — F325 Major depressive disorder, single episode, in full remission: Secondary | ICD-10-CM

## 2017-06-09 DIAGNOSIS — Z6841 Body Mass Index (BMI) 40.0 and over, adult: Secondary | ICD-10-CM

## 2017-06-10 NOTE — Telephone Encounter (Signed)
Is this okay to refill? 

## 2017-06-10 NOTE — Telephone Encounter (Signed)
Looks like it was last filled #30 with 2 refills on 04/07/17.  Shouldn't need refill until January (was this auto refill since she likely just picked up last one??)

## 2017-06-16 ENCOUNTER — Other Ambulatory Visit: Payer: Self-pay | Admitting: Family Medicine

## 2017-06-16 DIAGNOSIS — I10 Essential (primary) hypertension: Secondary | ICD-10-CM

## 2017-06-30 ENCOUNTER — Other Ambulatory Visit: Payer: Self-pay | Admitting: Family Medicine

## 2017-07-10 ENCOUNTER — Other Ambulatory Visit: Payer: Self-pay | Admitting: Family Medicine

## 2017-07-10 DIAGNOSIS — Z6841 Body Mass Index (BMI) 40.0 and over, adult: Secondary | ICD-10-CM

## 2017-07-12 NOTE — Telephone Encounter (Signed)
Is this okay to refill? 

## 2017-07-30 ENCOUNTER — Other Ambulatory Visit: Payer: Self-pay | Admitting: Family Medicine

## 2017-08-02 ENCOUNTER — Telehealth: Payer: Self-pay | Admitting: Family Medicine

## 2017-08-02 NOTE — Telephone Encounter (Signed)
[facility] req refill on Topiramate 25 mg   3 times daily

## 2017-08-02 NOTE — Telephone Encounter (Signed)
Is this okay to refill? 

## 2017-08-02 NOTE — Telephone Encounter (Signed)
This is also in as a refill request--not sure how you can look to see that to avoid the extra work of entering in as a phone encounter. This was already in as a refill request electronically.  I did it. Just FYI

## 2017-08-24 DIAGNOSIS — H43812 Vitreous degeneration, left eye: Secondary | ICD-10-CM | POA: Diagnosis not present

## 2017-08-29 ENCOUNTER — Other Ambulatory Visit: Payer: Self-pay | Admitting: Family Medicine

## 2017-08-29 DIAGNOSIS — E782 Mixed hyperlipidemia: Secondary | ICD-10-CM

## 2017-09-06 ENCOUNTER — Other Ambulatory Visit: Payer: BLUE CROSS/BLUE SHIELD

## 2017-09-07 ENCOUNTER — Other Ambulatory Visit: Payer: BLUE CROSS/BLUE SHIELD

## 2017-09-07 DIAGNOSIS — I10 Essential (primary) hypertension: Secondary | ICD-10-CM

## 2017-09-07 DIAGNOSIS — E8881 Metabolic syndrome: Secondary | ICD-10-CM

## 2017-09-07 DIAGNOSIS — Z5181 Encounter for therapeutic drug level monitoring: Secondary | ICD-10-CM | POA: Diagnosis not present

## 2017-09-07 DIAGNOSIS — F325 Major depressive disorder, single episode, in full remission: Secondary | ICD-10-CM | POA: Diagnosis not present

## 2017-09-07 DIAGNOSIS — E782 Mixed hyperlipidemia: Secondary | ICD-10-CM | POA: Diagnosis not present

## 2017-09-07 NOTE — Progress Notes (Signed)
Chief Complaint  Patient presents with  . Weightloss    follow up on weightloss.     Obesity: Previously changed from Qsymia to generic topiramate and phentermine, and remains on this regimen. Altered taste continues, nausea resolved.  We didn't see results at last visit (had gained weight, despite the medication), but she did not want to go off the medication. Anxious about possibly changing the medications, and stress makes her go to food. She felt like she hadn't given it her best (over-eating since Thanksgiving, only exercising x 3 weeks prior to last visit) and requested to stay on medication for another 3 mos. She presents today for follow-up.  Work had gotten better at last visit, busier again recently (but not as many hours as before, still manageable).  [employer] in mid-November, going fairly regularly (was going 3-4x/week at lunchtime, 30-45 minutes, and longer on Sunday mornings, but hasn't gone in the last 10d--still usually going 3x/wk).  Strength and circuit classes, and cardio. She still really enjoys it.   Got drum lessons for Christmas. She enjoys it more, now that she is better at it. Electric, does with headphones.  Cut out Cheerios and switched to raisin bran--is better about keeping portions controlled, since she doesn't like it quite as much as the Cheerios. Can't remember last time she had ice cream. +[employer] cookies recently, but they are now gone. Still not eating much vegetables. Asking about home delivery options, to help motivate her to cook more.  Does some Boost shakes as meal replacement. Satisfies her hunger. Meds help her not crave the things that are bad for her, as they don't taste the same.  Depression/anxiety: On Wellbutrin and citalopram. Denies side effects. Doing well overall (drumming helps, as does exercise).  Hypertension: BP's remain well controlled on the 1/2 tablet of HCTZ. Has home monitor, but hasn't been checking BP  regularly. BP's have been normal at other offices. Rare dizziness, if she isn't drinking enough water.  Hyperlipidemia follow-up: Patient is reportedly following a low-fat, low cholesterol diet. Compliant with medications and denies medication side effects.Prior to her last labs, she had admittedly been eating more ice cream.  She had labs repeated prior to today's visit (see below exam) RecentLabs       Lab Results  Component Value Date   CHOL 187 03/09/2017   HDL 43 (L) 03/09/2017   LDLCALC 79 09/30/2016   TRIG 285 (H) 03/09/2017   CHOLHDL 4.3 03/09/2017     PCOS/insulin resistance: Compliant with metformin. Denies polydipsia, polyuria. She has some tingling in her feet (on both feet, between the great and 2nd toes (medially), ongoing, without change. Lab Results  Component Value Date   HGBA1C 5.2 06/02/2017   Clenches teeth, and has been having some right ear pain Asking for refill of xanax--uses once a month or less  PMH, PSH, SH reviewed  Outpatient Encounter Medications as of 09/08/2017  Medication Sig Note  . ALPRAZolam (XANAX) 0.5 MG tablet TAKE 1/2 -1 TABLET BY MOUTH 3 TIMES DAILY AS NEEDED FOR ANXIETY   . atorvastatin (LIPITOR) 40 MG tablet Take 1 tablet (40 mg total) by mouth daily.   Marland Kitchen buPROPion (WELLBUTRIN XL) 300 MG 24 hr tablet Take 1 tablet (300 mg total) by mouth daily.   . Calcium Carbonate-Vitamin D (CALCIUM + D) 600-200 MG-UNIT TABS Take 2 tablets by mouth daily.     . cetirizine (ZYRTEC) 10 MG tablet Take 10 mg by mouth daily.     Marland Kitchen  citalopram (CELEXA) 40 MG tablet TAKE 1 TABLET BY MOUTH ONCE DAILY   . diclofenac (VOLTAREN) 75 MG EC tablet Take 1 tablet (75 mg total) by mouth 2 (two) times daily.   . Diclofenac Sodium (PENNSAID) 1.5 % SOLN Place 1 each onto the skin 2 (two) times daily.   . Ferrous Sulfate (IRON) 325 (65 Fe) MG TABS Take 1 tablet by mouth daily.   . fish oil-omega-3 fatty acids 1000 MG capsule Take 3 g by mouth daily.    . flintstones  complete (FLINTSTONES) 60 MG chewable tablet Chew 1 tablet by mouth daily.   . fluticasone (FLONASE) 50 MCG/ACT nasal spray INSTILL 1 TO 2 SPRAYS INTO EACH NOSTRIL ONCE A DAY   . hydrochlorothiazide (MICROZIDE) 12.5 MG capsule Take 1 capsule (12.5 mg total) by mouth daily.   Marland Kitchen losartan (COZAAR) 25 MG tablet Take 1 tablet (25 mg total) by mouth daily.   . metFORMIN (GLUCOPHAGE-XR) 500 MG 24 hr tablet TAKE 2 TABLETS BY MOUTH EVERY MORNING AND 1 TABLET EVERY EVENING   . mometasone-formoterol (DULERA) 100-5 MCG/ACT AERO Inhale 2 puffs into the lungs 2 (two) times daily. 03/09/2017: Uses 1 puff daily  . nystatin (MYCOSTATIN/NYSTOP) powder Apply 1 g topically 2 (two) times daily.    Marland Kitchen omeprazole (PRILOSEC) 20 MG capsule Take 20 mg by mouth daily.   03/09/2017: Takes about qod  . phentermine 15 MG capsule TAKE 1 CAPSULE BY MOUTH EVERY DAY   . topiramate (TOPAMAX) 25 MG tablet Take 3 tablets (75 mg total) by mouth daily.   . vitamin B-12 (CYANOCOBALAMIN) 1000 MCG tablet Take 1,000 mcg by mouth daily.     . vitamin C (ASCORBIC ACID) 500 MG tablet Take 500 mg by mouth daily.   . [DISCONTINUED] ALPRAZolam (XANAX) 0.5 MG tablet TAKE 1/2 -1 TABLET BY MOUTH 3 TIMES DAILY AS NEEDED FOR ANXIETY   . [DISCONTINUED] atorvastatin (LIPITOR) 40 MG tablet TAKE 1 TABLET BY MOUTH ONCE DAILY   . [DISCONTINUED] buPROPion (WELLBUTRIN XL) 300 MG 24 hr tablet TAKE 1 TABLET BY MOUTH EVERY DAY   . [DISCONTINUED] diclofenac (VOLTAREN) 75 MG EC tablet Take 1 tablet (75 mg total) by mouth 2 (two) times daily.   . [DISCONTINUED] losartan (COZAAR) 25 MG tablet TAKE 1 TABLET BY MOUTH ONCE DAILY   . [DISCONTINUED] phentermine 15 MG capsule TAKE 1 CAPSULE BY MOUTH EVERY DAY   . [DISCONTINUED] topiramate (TOPAMAX) 25 MG tablet TAKE 3 TABLETS BY MOUTH DAILY   . cyclobenzaprine (FLEXERIL) 5 MG tablet as needed. Reported on 09/02/2015 02/14/2014: Takes prn back pain (last rx'd by Dr. Leo Grosser; also helps with endometriosis pain)   No  facility-administered encounter medications on file as of 09/08/2017.    Allergies  Allergen Reactions  . Meloxicam Itching   ROS: no fever, chills, URI symptoms. +R ear discomfort. No cough, breathing is at baseline. No chest pain, palpitations, nausea, vomiting, bowel changes, bleeding, bruising, rash. Continues to have back pain. See HPI.   PHYSICAL EXAM:  BP 100/70   Pulse 84   Ht 5' 4"  (1.626 m)   Wt 239 lb (108.4 kg)   LMP 02/09/2009 (Exact Date)   BMI 41.02 kg/m   Wt Readings from Last 3 Encounters:  09/08/17 239 lb (108.4 kg)  06/02/17 241 lb (109.3 kg)  03/09/17 237 lb 3.2 oz (107.6 kg)    HEENT: PERRL, EOMI, conjunctiva and sclera are clear. OP clear. TM's and EAC's normal. TMJ and temporalis muscles are nontender Neck: no lymphadenopathy,  thyromegaly or mass Heart: regular rate and rhythm, no murmur or ectopy Lungs: clear bilaterally Back: no CVA tenderness Abdomen: soft, nontender, no mass Extremities: no edema Psych: normal mood, affect, hygiene, grooming, eye contact, speech Neuro: alert and oriented, normal strength, gait, cranial nerves grossly intact.     Chemistry      Component Value Date/Time   NA 141 09/07/2017 0903   K 4.7 09/07/2017 0903   CL 105 09/07/2017 0903   CO2 21 09/07/2017 0903   BUN 23 09/07/2017 0903   CREATININE 1.08 (H) 09/07/2017 0903   CREATININE 0.97 03/09/2017 1110      Component Value Date/Time   CALCIUM 9.7 09/07/2017 0903   ALKPHOS 96 09/07/2017 0903   AST 44 (H) 09/07/2017 0903   ALT 52 (H) 09/07/2017 0903   BILITOT 0.2 09/07/2017 0903     Fasting glu 85  Lab Results  Component Value Date   CHOL 176 09/07/2017   HDL 41 09/07/2017   LDLCALC 91 09/07/2017   TRIG 220 (H) 09/07/2017   CHOLHDL 4.3 09/07/2017   Lab Results  Component Value Date   TSH 1.770 09/07/2017   Lab Results  Component Value Date   WBC 5.8 09/07/2017   HGB 11.9 09/07/2017   HCT 36.6 09/07/2017   MCV 93 09/07/2017   PLT 247 09/07/2017      ASSESSMENT/PLAN:   Mixed hyperlipidemia - reviewed lowfat, low cholesterol diet, fish oil, cont meds. - Plan: atorvastatin (LIPITOR) 40 MG tablet  Depression, major, in remission ([place]) - Plan: buPROPion (WELLBUTRIN XL) 300 MG 24 hr tablet  Essential hypertension - well controlled - Plan: losartan (COZAAR) 25 MG tablet  BMI 40.0-44.9, adult (HCC) - cont meds, though not impressed with results. Counseled re: healthy diet, portions, exercise. f/u 3 mos - Plan: phentermine 15 MG capsule, topiramate (TOPAMAX) 25 MG tablet  Chronic low back pain, unspecified back pain laterality, with sciatica presence unspecified - Plan: diclofenac (VOLTAREN) 75 MG EC tablet  Anxiety state - continue stress reduction techniques. rare use of alprazolam prn - Plan: ALPRAZolam (XANAX) 0.5 MG tablet  Elevated LFTs - suspect due to fatty liver; declines [place] today. recheck LFT's at next visit and re-consider if remains elevated   Declines abdominal US to eval for elevated LFT's Prefers to wait until after next visit.   It is very important that you get at least 64 ounces of water daily. Continue to try and limit sweets and carbs. Try and introduce more fruits and vegetables daily. Work on [employer]. Continue current medications. We may need to check an ultrasound to evaluate the liver, if the liver tests remain elevated. I do suspect that there is some fatty changes to the liver, that will improve with weight loss.

## 2017-09-07 NOTE — Addendum Note (Signed)
Addended by: Minette Headland A on: 09/07/2017 08:35 AM   Modules accepted: Orders

## 2017-09-08 ENCOUNTER — Ambulatory Visit: Payer: BLUE CROSS/BLUE SHIELD | Admitting: Family Medicine

## 2017-09-08 ENCOUNTER — Encounter: Payer: Self-pay | Admitting: Family Medicine

## 2017-09-08 VITALS — BP 100/70 | HR 84 | Ht 64.0 in | Wt 239.0 lb

## 2017-09-08 DIAGNOSIS — M545 Low back pain: Secondary | ICD-10-CM

## 2017-09-08 DIAGNOSIS — I10 Essential (primary) hypertension: Secondary | ICD-10-CM

## 2017-09-08 DIAGNOSIS — E782 Mixed hyperlipidemia: Secondary | ICD-10-CM | POA: Diagnosis not present

## 2017-09-08 DIAGNOSIS — R945 Abnormal results of liver function studies: Secondary | ICD-10-CM | POA: Diagnosis not present

## 2017-09-08 DIAGNOSIS — F325 Major depressive disorder, single episode, in full remission: Secondary | ICD-10-CM

## 2017-09-08 DIAGNOSIS — F411 Generalized anxiety disorder: Secondary | ICD-10-CM

## 2017-09-08 DIAGNOSIS — R7989 Other specified abnormal findings of blood chemistry: Secondary | ICD-10-CM

## 2017-09-08 DIAGNOSIS — Z6841 Body Mass Index (BMI) 40.0 and over, adult: Secondary | ICD-10-CM | POA: Diagnosis not present

## 2017-09-08 DIAGNOSIS — G8929 Other chronic pain: Secondary | ICD-10-CM | POA: Diagnosis not present

## 2017-09-08 LAB — COMPREHENSIVE METABOLIC PANEL
ALT: 52 IU/L — AB (ref 0–32)
AST: 44 IU/L — AB (ref 0–40)
Albumin/Globulin Ratio: 1.8 (ref 1.2–2.2)
Albumin: 4.6 g/dL (ref 3.5–5.5)
Alkaline Phosphatase: 96 IU/L (ref 39–117)
BUN/Creatinine Ratio: 21 (ref 9–23)
BUN: 23 mg/dL (ref 6–24)
Bilirubin Total: 0.2 mg/dL (ref 0.0–1.2)
CALCIUM: 9.7 mg/dL (ref 8.7–10.2)
CO2: 21 mmol/L (ref 20–29)
Chloride: 105 mmol/L (ref 96–106)
Creatinine, Ser: 1.08 mg/dL — ABNORMAL HIGH (ref 0.57–1.00)
GFR, EST AFRICAN AMERICAN: 68 mL/min/{1.73_m2} (ref >59)
GFR, EST NON AFRICAN AMERICAN: 59 mL/min/{1.73_m2} — AB (ref >59)
GLUCOSE: 85 mg/dL (ref 65–99)
Globulin, Total: 2.6 g/dL (ref 1.5–4.5)
Potassium: 4.7 mmol/L (ref 3.5–5.2)
Sodium: 141 mmol/L (ref 134–144)
TOTAL PROTEIN: 7.2 g/dL (ref 6.0–8.5)

## 2017-09-08 LAB — CBC WITH DIFFERENTIAL/PLATELET
BASOS: 1 %
Basophils Absolute: 0 10*3/uL (ref 0.0–0.2)
EOS (ABSOLUTE): 0.2 10*3/uL (ref 0.0–0.4)
Eos: 4 %
HEMOGLOBIN: 11.9 g/dL (ref 11.1–15.9)
Hematocrit: 36.6 % (ref 34.0–46.6)
IMMATURE GRANS (ABS): 0 10*3/uL (ref 0.0–0.1)
IMMATURE GRANULOCYTES: 0 %
LYMPHS: 42 %
Lymphocytes Absolute: 2.4 10*3/uL (ref 0.7–3.1)
MCH: 30.2 pg (ref 26.6–33.0)
MCHC: 32.5 g/dL (ref 31.5–35.7)
MCV: 93 fL (ref 79–97)
Monocytes Absolute: 0.3 10*3/uL (ref 0.1–0.9)
Monocytes: 6 %
NEUTROS ABS: 2.8 10*3/uL (ref 1.4–7.0)
NEUTROS PCT: 47 %
PLATELETS: 247 10*3/uL (ref 150–379)
RBC: 3.94 x10E6/uL (ref 3.77–5.28)
RDW: 14.4 % (ref 12.3–15.4)
WBC: 5.8 10*3/uL (ref 3.4–10.8)

## 2017-09-08 LAB — LIPID PANEL
CHOL/HDL RATIO: 4.3 ratio (ref 0.0–4.4)
Cholesterol, Total: 176 mg/dL (ref 100–199)
HDL: 41 mg/dL (ref >39)
LDL Calculated: 91 mg/dL (ref 0–99)
TRIGLYCERIDES: 220 mg/dL — AB (ref 0–149)
VLDL Cholesterol Cal: 44 mg/dL — ABNORMAL HIGH (ref 5–40)

## 2017-09-08 LAB — TSH: TSH: 1.77 u[IU]/mL (ref 0.450–4.500)

## 2017-09-08 MED ORDER — BUPROPION HCL ER (XL) 300 MG PO TB24: 300.0000 mg | ORAL_TABLET | Freq: Every day | ORAL | 5 refills | Status: DC

## 2017-09-08 MED ORDER — ALPRAZOLAM 0.5 MG PO TABS: ORAL_TABLET | ORAL | 0 refills | Status: DC

## 2017-09-08 MED ORDER — LOSARTAN POTASSIUM 25 MG PO TABS: 25.0000 mg | ORAL_TABLET | Freq: Every day | ORAL | 5 refills | Status: DC

## 2017-09-08 MED ORDER — TOPIRAMATE 25 MG PO TABS: 75.0000 mg | ORAL_TABLET | Freq: Every day | ORAL | 2 refills | Status: DC

## 2017-09-08 MED ORDER — PHENTERMINE HCL 15 MG PO CAPS: ORAL_CAPSULE | ORAL | 2 refills | Status: DC

## 2017-09-08 MED ORDER — ATORVASTATIN CALCIUM 40 MG PO TABS: 40.0000 mg | ORAL_TABLET | Freq: Every day | ORAL | 5 refills | Status: DC

## 2017-09-08 MED ORDER — DICLOFENAC SODIUM 75 MG PO TBEC: 75.0000 mg | DELAYED_RELEASE_TABLET | Freq: Two times a day (BID) | ORAL | 5 refills | Status: DC

## 2017-09-08 NOTE — Patient Instructions (Signed)
  It is very important that you get at least 64 ounces of water daily. Continue to try and limit sweets and carbs. Try and introduce more fruits and vegetables daily. Work on [employer]. Continue current medications. We may need to check an ultrasound to evaluate the liver, if the liver tests remain elevated. I do suspect that there is some fatty changes to the liver, that will improve with weight loss.

## 2017-09-09 DIAGNOSIS — H43812 Vitreous degeneration, left eye: Secondary | ICD-10-CM | POA: Diagnosis not present

## 2017-09-28 ENCOUNTER — Other Ambulatory Visit: Payer: Self-pay | Admitting: Internal Medicine

## 2017-10-09 ENCOUNTER — Other Ambulatory Visit: Payer: Self-pay | Admitting: Family Medicine

## 2017-10-09 DIAGNOSIS — F325 Major depressive disorder, single episode, in full remission: Secondary | ICD-10-CM

## 2017-10-09 DIAGNOSIS — F411 Generalized anxiety disorder: Secondary | ICD-10-CM

## 2017-10-13 DIAGNOSIS — N809 Endometriosis, unspecified: Secondary | ICD-10-CM | POA: Diagnosis not present

## 2017-10-13 DIAGNOSIS — R102 Pelvic and perineal pain: Secondary | ICD-10-CM | POA: Diagnosis not present

## 2017-10-13 DIAGNOSIS — Z6841 Body Mass Index (BMI) 40.0 and over, adult: Secondary | ICD-10-CM | POA: Diagnosis not present

## 2017-10-13 DIAGNOSIS — M5126 Other intervertebral disc displacement, lumbar region: Secondary | ICD-10-CM | POA: Diagnosis not present

## 2017-10-14 DIAGNOSIS — B369 Superficial mycosis, unspecified: Secondary | ICD-10-CM | POA: Diagnosis not present

## 2017-10-14 DIAGNOSIS — R102 Pelvic and perineal pain: Secondary | ICD-10-CM | POA: Diagnosis not present

## 2017-10-26 DIAGNOSIS — K4090 Unilateral inguinal hernia, without obstruction or gangrene, not specified as recurrent: Secondary | ICD-10-CM | POA: Diagnosis not present

## 2017-10-28 ENCOUNTER — Other Ambulatory Visit: Payer: Self-pay | Admitting: General Surgery

## 2017-10-28 DIAGNOSIS — K4090 Unilateral inguinal hernia, without obstruction or gangrene, not specified as recurrent: Secondary | ICD-10-CM

## 2017-10-29 ENCOUNTER — Other Ambulatory Visit: Payer: Self-pay | Admitting: Family Medicine

## 2017-10-29 DIAGNOSIS — I10 Essential (primary) hypertension: Secondary | ICD-10-CM

## 2017-11-03 ENCOUNTER — Ambulatory Visit
Admission: RE | Admit: 2017-11-03 | Discharge: 2017-11-03 | Disposition: A | Discharge status: Home / Self Care | Payer: BLUE CROSS/BLUE SHIELD | Source: Ambulatory Visit | Attending: General Surgery | Admitting: General Surgery

## 2017-11-03 DIAGNOSIS — K4090 Unilateral inguinal hernia, without obstruction or gangrene, not specified as recurrent: Secondary | ICD-10-CM | POA: Diagnosis not present

## 2017-11-03 MED ORDER — IOPAMIDOL (ISOVUE-300) INJECTION 61%
125.0000 mL | Freq: Once | INTRAVENOUS | Status: AC | PRN
  Administered 2017-11-03: 125 mL via INTRAVENOUS

## 2017-11-09 ENCOUNTER — Other Ambulatory Visit: Payer: Self-pay | Admitting: Family Medicine

## 2017-11-09 DIAGNOSIS — E8881 Metabolic syndrome: Secondary | ICD-10-CM

## 2017-11-12 ENCOUNTER — Ambulatory Visit: Payer: BLUE CROSS/BLUE SHIELD | Admitting: Internal Medicine

## 2017-11-12 ENCOUNTER — Encounter: Payer: Self-pay | Admitting: Internal Medicine

## 2017-11-12 VITALS — BP 132/84 | HR 81 | Ht 64.0 in | Wt 244.4 lb

## 2017-11-12 DIAGNOSIS — J4540 Moderate persistent asthma, uncomplicated: Secondary | ICD-10-CM

## 2017-11-12 LAB — NITRIC OXIDE: Nitric Oxide: 34

## 2017-11-12 MED ORDER — FLUTICASONE FUROATE 100 MCG/ACT IN AEPB: 1.0000 | INHALATION_SPRAY | Freq: Every day | RESPIRATORY_TRACT | 0 refills | Status: DC

## 2017-11-12 NOTE — Patient Instructions (Signed)
,     ICD-10-CM   1. Moderate persistent asthma in adult without complication X59.73     Continued good control with dulera  1 puff once daily  Plan Albuterol as needed STart arnuity inhaled steroid once daily Flu shot in fall  followup 9 months or sooner if needed

## 2017-11-12 NOTE — Addendum Note (Signed)
Addended by: Lorretta Harp on: 11/12/2017 10:36 AM   Modules accepted: Orders

## 2017-11-12 NOTE — Progress Notes (Signed)
Subjective:     Patient ID: Susan Gould, female   DOB: 1966-03-23, 52 y.o.   MRN: [id number]  HPI   V 05/10/2015  Chief Complaint  Patient presents with  . Follow-up    Pt states her breathing is doing well. Pt c/o mild prod cough. Pt states overall she does not have any complaints.    52 year old obese  patient with asthma . She used tpo be followed by Dr Joya Gaskins for asthma.  Now transfer of care to Dr Chase Caller  - Reports life loing hx of asthma. Moved from [place] to [place] in the 80s. Last asthma exac > 25 years ago. Does not remoember last prednisone. This is routine 6 month fu. Last visit April 2016. Since then compliant as always with dulera 2 puff bid. Never used alb rescue. ACQ score is 0 out of 5 showing grood control Exhaled NO is 32 and above 25 and so slightly high but in grey zone.   - Specifically she does not wake up at night because of asthma. When she wakes up she is asymptomatic. She is not limited in her activities in the daytime because of asthma. She does not have any shortness of breath because of asthma. No wheezing because of asthma. No albuterol rescue use. Review of medications shows she is on ACE inhibitor but she does not have a cough  OV 03/03/2016  Chief Complaint  Patient presents with  . Follow-up    Says she is doing better, no coughing or sob or chest pain   FU mod persistent asthma  - doing well. Had knee surgery without problems. Asthma control questionnaire shows excellent control with score of 0 out of 5. She does not wake up in the middle of the night with asthma symptoms. When she wakes up she does not have any asthma symptoms. Activities are not limited because of asthma. She does not expands any shortness of breath or wheezing because of asthma. She only uses her Dulera one puff once daily and she uses it 2 times a month for rescue. However she is hesitant to down scale to inhaled steroid alone because she has several supply samples of Dulera. She  has not had flu shot but will have it today.   OV 06/02/2016  Chief Complaint  Patient presents with  . Follow-up    Pt c/o sinus congestion and dry cough, productive cough  in mornings with yellow mucus. Pt c/o slight increase in SOB. Pt denies CP/tightness and f/c/s.     52 year old female with moderate persistent asthma  3 month follow-up. At this visit he was supposed to be escalate asthma therapy given her continued good health. However she is reporting for the last 1 month because of the weather change in the fall season she's having increased sinus drainage and increased cough and shortness of breath but not necessarily increased wheezing or albuterol rescue use or sputum or fever. She does have increased chest tightness. The [facility] seems to help. However she is only using 1 puff twice daily. She is in agreement for some prednisone therapy and escalating her maintenance Dulera dosage   OV 02/15/2017  Chief Complaint  Patient presents with  . Follow-up    Pt states she feels at baseline. Pt states she has a mild cough that she attributes to reflux. Pt denies CP/tightness and f/c/s.     52 year old female doing well with moderate persistent asthma. At this point in time she's cut down her Dulera to  1 puff once daily. Asthma control questionnaire is 0. Is not using albuterol for rescue. She is not waking up in the middle of the night with symptoms. When she wakes up in the morning she does not have any symptoms. No shortness of breath and wheezing or cough or chest tightness. She plans to get a flu shot. Of note, 4 days ago she swallowed hydrochlorothiazide pill and she thinks this is stuck in her throat   OV 11/12/2017  Chief Complaint  Patient presents with  . Follow-up    Pt states she has been doing good since last visit. States she has not had any flare ups.   Susan Gould presents for followup of her mild to mod persistent asthma. Doing well since last visit without  flareups.  She only takes her Dulera 1 puff once daily.  She is not using albuterol for rescue.  She is not having any nighttime symptoms.  When she wakes up she has no symptoms.  Her activities are not limited at all because of asthma.  She expenses no shortness of breath or wheezing and does not use albuterol for rescue.  The only issue she is dealing with is back pain.  She is compliant with her CPAP at night.  There are no throat issues.  She did have a flu shot in the fall.  Exam nitric oxide is in the intermediate zone today at 34 ppb but she is totally asymptomatic  Asthma Control Panel - normal spiro feb 2015 -IgE - none done - cxr 2001 - clear - positive allergy testing by history greater than 25 years ago - eos 200cells march2017 05/10/2015  03/03/2016  02/15/2017  11/12/2017   Current Med Regimen dulera dulera - takes it 1 puff daily + prn 2 times a month dulera 1 puff once daily   ACQ 5 point- 1 week. wtd avg score. <1.0 is good control 0.75-1.25 is grey zone. >1.25 poor control. Delta 0.5 is clinically meaningful ZERO 0 0 o  FeNO ppB 32ppb  26ppb 34  FeV1  x x    Planned intervention  for visit contininue dulera, change ace to arb Cont dulera 1 puff twice daily   chagne to arunityu       has a past medical history of Allergic rhinitis, cause unspecified, Anxiety (2006), Asthma, Colon polyps (2007), Depression (2006), Endometriosis, Family history of breast cancer in female, GERD (gastroesophageal reflux disease), Hemorrhoid, Hyperlipidemia, Hypertension, Insulin resistance, Medial meniscus tear, Obesity, and OSA (obstructive sleep apnea).   reports that she has never smoked. She has never used smokeless tobacco.  Past Surgical History:  Procedure Laterality Date  . CHOLECYSTECTOMY  1986  . FINGER GANGLION CYST EXCISION  2003   RIGHT 3rd finger  . KNEE SURGERY  '95 and '97   BILATERAL KNEE  . LAPAROSCOPY  '98, '01   endometriosis (Dr. Leo Grosser)  . LASIK  2000  . tibial  tuberoplasty  90's   left    Allergies  Allergen Reactions  . Meloxicam Itching    Immunization History  Administered Date(s) Administered  . Influenza Split 04/17/2012  . Influenza Whole 03/29/2009, 03/17/2010, 04/23/2011  . Influenza,inj,Quad PF,6+ Mos 03/07/2013, 02/14/2014, 02/21/2015, 03/03/2016, 03/09/2017  . Pneumococcal Polysaccharide-23 03/07/2013  . Td 08/14/2004  . Tdap 07/29/2011  . Zoster Recombinat (Shingrix) 10/28/2016, 03/09/2017    Family History  Problem Relation Age of Onset  . Diabetes Mother   . Hypertension Mother   . Hyperlipidemia Mother   .  Arthritis Mother   . Cancer Mother 36       breast cancer  . Breast cancer Mother 54  . Diabetes Father   . Hyperlipidemia Father   . Hypertension Father   . Asthma Father   . Arthritis Father   . Heart disease Father        CHF, hypokinesis  . COPD Father        due to asbestos exposure; former smoker  . Pulmonary embolism Father   . Parkinson's disease Father   . Hypertension Sister   . Colon cancer Maternal Grandmother        late 82's  . Cancer Maternal Grandmother        colon  . Heart disease Maternal Grandmother        MI in 36's  . Breast cancer Paternal Aunt        58's  . Cancer Paternal Aunt        breast  . Heart disease Paternal Uncle   . Diabetes Paternal Uncle   . Kidney disease Paternal Uncle      Current Outpatient Medications:  .  ALPRAZolam (XANAX) 0.5 MG tablet, TAKE 1/2 -1 TABLET BY MOUTH 3 TIMES DAILY AS NEEDED FOR ANXIETY, Disp: 20 tablet, Rfl: 0 .  atorvastatin (LIPITOR) 40 MG tablet, Take 1 tablet (40 mg total) by mouth daily., Disp: 30 tablet, Rfl: 5 .  buPROPion (WELLBUTRIN XL) 300 MG 24 hr tablet, Take 1 tablet (300 mg total) by mouth daily., Disp: 30 tablet, Rfl: 5 .  Calcium Carbonate-Vitamin D (CALCIUM + D) 600-200 MG-UNIT TABS, Take 2 tablets by mouth daily.  , Disp: , Rfl:  .  cetirizine (ZYRTEC) 10 MG tablet, Take 10 mg by mouth daily.  , Disp: , Rfl:  .   citalopram (CELEXA) 40 MG tablet, TAKE 1 TABLET BY MOUTH ONCE DAILY, Disp: 30 tablet, Rfl: 1 .  diclofenac (VOLTAREN) 75 MG EC tablet, Take 1 tablet (75 mg total) by mouth 2 (two) times daily., Disp: 60 tablet, Rfl: 5 .  DULERA 100-5 MCG/ACT AERO, INHALE 2 PUFFS BY MOUTH TWICE DAILY, Disp: 13 g, Rfl: 1 .  Ferrous Sulfate (IRON) 325 (65 Fe) MG TABS, Take 1 tablet by mouth daily., Disp: , Rfl:  .  fish oil-omega-3 fatty acids 1000 MG capsule, Take 3 g by mouth daily. , Disp: , Rfl:  .  flintstones complete (FLINTSTONES) 60 MG chewable tablet, Chew 1 tablet by mouth daily., Disp: , Rfl:  .  fluticasone (FLONASE) 50 MCG/ACT nasal spray, INSTILL 1 TO 2 SPRAYS INTO EACH NOSTRIL ONCE A DAY, Disp: 16 g, Rfl: 11 .  hydrochlorothiazide (MICROZIDE) 12.5 MG capsule, TAKE 1 CAPSULE(12.5 MG) BY MOUTH DAILY, Disp: 30 capsule, Rfl: 0 .  losartan (COZAAR) 25 MG tablet, Take 1 tablet (25 mg total) by mouth daily., Disp: 30 tablet, Rfl: 5 .  metFORMIN (GLUCOPHAGE-XR) 500 MG 24 hr tablet, TAKE 2 TABLETS BY MOUTH EVERY MORNING AND 1 TABLET EVERY EVENING, Disp: 90 tablet, Rfl: 0 .  nystatin (MYCOSTATIN/NYSTOP) powder, Apply 1 g topically 2 (two) times daily. , Disp: , Rfl: 0 .  omeprazole (PRILOSEC) 20 MG capsule, Take 20 mg by mouth daily.  , Disp: , Rfl:  .  phentermine 15 MG capsule, TAKE 1 CAPSULE BY MOUTH EVERY DAY, Disp: 30 capsule, Rfl: 2 .  topiramate (TOPAMAX) 25 MG tablet, Take 3 tablets (75 mg total) by mouth daily., Disp: 90 tablet, Rfl: 2 .  vitamin B-12 (CYANOCOBALAMIN) 1000  MCG tablet, Take 1,000 mcg by mouth daily.  , Disp: , Rfl:  .  vitamin C (ASCORBIC ACID) 500 MG tablet, Take 500 mg by mouth daily., Disp: , Rfl:  .  cyclobenzaprine (FLEXERIL) 5 MG tablet, as needed. Reported on 09/02/2015, Disp: , Rfl:    Review of Systems     Objective:   Physical Exam  Constitutional: She is oriented to person, place, and time. She appears well-developed and well-nourished. No distress.  HENT:  Head:  Normocephalic and atraumatic.  Right Ear: External ear normal.  Left Ear: External ear normal.  Mouth/Throat: Oropharynx is clear and moist. No oropharyngeal exudate.  Eyes: Pupils are equal, round, and reactive to light. Conjunctivae and EOM are normal. Right eye exhibits no discharge. Left eye exhibits no discharge. No scleral icterus.  Neck: Normal range of motion. Neck supple. No JVD present. No tracheal deviation present. No thyromegaly present.  Cardiovascular: Normal rate, regular rhythm, normal heart sounds and intact distal pulses. Exam reveals no gallop and no friction rub.  No murmur heard. Pulmonary/Chest: Effort normal and breath sounds normal. No respiratory distress. She has no wheezes. She has no rales. She exhibits no tenderness.  Abdominal: Soft. Bowel sounds are normal. She exhibits no distension and no mass. There is no tenderness. There is no rebound and no guarding.  Musculoskeletal: Normal range of motion. She exhibits no edema or tenderness.  Lymphadenopathy:    She has no cervical adenopathy.  Neurological: She is alert and oriented to person, place, and time. She has normal reflexes. No cranial nerve deficit. She exhibits normal muscle tone. Coordination normal.  Skin: Skin is warm and dry. No rash noted. She is not diaphoretic. No erythema. No pallor.  Psychiatric: She has a normal mood and affect. Her behavior is normal. Judgment and thought content normal.  Vitals reviewed.  Vitals:   11/12/17 0859  BP: 132/84  Pulse: 81  SpO2: 97%  Weight: 244 lb 6.4 oz (110.9 kg)  Height: 5' 4"  (1.626 m)    Estimated body mass index is 41.95 kg/m as calculated from the following:   Height as of this encounter: 5' 4"  (1.626 m).   Weight as of this encounter: 244 lb 6.4 oz (110.9 kg).     Assessment:    .   ICD-10-CM   1. Moderate persistent asthma in adult without complication E95.28        Plan:      Continued good control with dulera  1 puff once  daily  Plan Albuterol as needed STart arnuity inhaled steroid once daily Flu shot in fall  followup 9 months or sooner if needed   Dr. Brand Males, M.D., [facility].C.P Pulmonary and Critical Care Medicine Staff Physician, [place] Director - Interstitial Lung Disease  Program  P[facility] [place] [facility] [place], [place], [address]  Pager: [phone], If no answer or between  15:00h - 7:00h: call [phone] Telephone: [phone]

## 2017-11-17 DIAGNOSIS — R109 Unspecified abdominal pain: Secondary | ICD-10-CM | POA: Diagnosis not present

## 2017-11-29 ENCOUNTER — Other Ambulatory Visit: Payer: Self-pay | Admitting: Family Medicine

## 2017-11-29 DIAGNOSIS — I10 Essential (primary) hypertension: Secondary | ICD-10-CM

## 2017-12-08 NOTE — Progress Notes (Signed)
Chief Complaint  Patient presents with  . Follow-up    3 month follow up  all is good  non fasting     Patient presents for 3 month follow-up.  She had elevated LFT's noted on last check. [place] was recommended (to eval for fatty liver vs other abnl), but was declined.  She is due for recheck of LFT's today. In the interim, she had CT abdomen (for evaluation of poss hernia/abdominal pain) last month: IMPRESSION: 1. Negative for abdominal or inguinal hernia. 2. Retained stool throughout redundant colon, but no acute or inflammatory process identified in the abdomen or pelvis. 3. Advanced lower lumbar disc and facet degeneration. 4. Evidence of hepatic steatosis.  She has been constipated due to taking iron daily, due to plasma/blood donations.  She cut back on the iron, bowels improved, and pain is starting to improve (moved from the right side to the left).  Obesity: Previously changed from Qsymia to generic topiramate and phentermine, and remains on this regimen. Altered taste continues, nausea resolved. Hasn't had significant weight loss due to periods where she was very busy with work, but has been very anxious about changing/stopping meds, fearing she will regain the weight that she has lost so far.  Since her visit 3 months ago, she reports she had another bad period at work, unable to get to the gym, not eating as well as she should. She tells me about a new program that she just started 2 weeks ago, called [facility].  I'm not familiar with this; she tells me that it is a program based on psychology, learning new habits, based on calorie-density of the foods.  Encourages small goals, portions, identifying triggers, logging food. Feels it is very supportive.  Coach checks in with her daily, so there is accountability. There is a group coach after the first 2 weeks. They set up an 8 month program for her; she reports her personal goal of 175# (not necessarily in that time frame). "this program  speaks my language"  [employer] in mid-November, intermittently going fairly regularly (was going 3-4x/week at lunchtime, 30-45 minutes, and longer on Sunday mornings. When it got very busy at work she didn't go much, but is back to regular gym time now. Strength and circuit classes, and cardio. She still really enjoys it.   Meds help her not crave the things that are bad for her, as they don't taste the same.  Depression/anxiety: On Wellbutrin and citalopram. Denies side effects. Doing well overall (drumming helps, as does exercise). Got drum lessons for Christmas. She enjoys it more, now that she is better at it. (Electric, does with headphones.)    Hypertension: BP's remain well controlled on 12.97m of HCTZ and losartan. Has home monitor, but hasn't been checking BP regularly. BP's have been normal at other offices. Rare dizziness, if she isn't drinking enough water. Palpitations if she drinks caffeinated coffee (drinks decaf and is fine.)  Hyperlipidemia follow-up: Patient is reportedly following a low-fat, low cholesterol diet. Compliant with medications and denies medication side effects. Lab Results  Component Value Date   CHOL 176 09/07/2017   HDL 41 09/07/2017   LDLCALC 91 09/07/2017   TRIG 220 (H) 09/07/2017   CHOLHDL 4.3 09/07/2017    PCOS/insulin resistance: Compliant with metformin. Denies polydipsia, polyuria. She has some tingling in her feet (on both feet, between the great and 2nd toes (medially), ongoing, without change.  Asthma:  Saw Dr. RChase Callerlast month. When she finishes [employer] she  will be changing to [place].  Clenches teeth, and has been having some right ear pain. This is ongoing. Massaging the muscles helps, related to stress. Doesn't need alprazolam refill  PMH, PSH, SH reviewed  Outpatient Encounter Medications as of 12/09/2017  Medication Sig Note  . ALPRAZolam (XANAX) 0.5 MG tablet TAKE 1/2 -1 TABLET BY MOUTH 3 TIMES DAILY AS  NEEDED FOR ANXIETY   . atorvastatin (LIPITOR) 40 MG tablet Take 1 tablet (40 mg total) by mouth daily.   Marland Kitchen buPROPion (WELLBUTRIN XL) 300 MG 24 hr tablet Take 1 tablet (300 mg total) by mouth daily.   . Calcium Carbonate-Vitamin D (CALCIUM + D) 600-200 MG-UNIT TABS Take 2 tablets by mouth daily.     . cetirizine (ZYRTEC) 10 MG tablet Take 10 mg by mouth daily.     . citalopram (CELEXA) 40 MG tablet Take 1 tablet (40 mg total) by mouth daily.   . cyclobenzaprine (FLEXERIL) 5 MG tablet as needed. Reported on 09/02/2015 02/14/2014: Takes prn back pain (last rx'd by Dr. Leo Grosser; also helps with endometriosis pain)  . diclofenac (VOLTAREN) 75 MG EC tablet Take 1 tablet (75 mg total) by mouth 2 (two) times daily.   . DULERA 100-5 MCG/ACT AERO INHALE 2 PUFFS BY MOUTH TWICE DAILY   . Ferrous Sulfate (IRON) 325 (65 Fe) MG TABS Take 1 tablet by mouth daily.   . fish oil-omega-3 fatty acids 1000 MG capsule Take 3 g by mouth daily.    . flintstones complete (FLINTSTONES) 60 MG chewable tablet Chew 1 tablet by mouth daily.   . fluticasone (FLONASE) 50 MCG/ACT nasal spray INSTILL 1 TO 2 SPRAYS INTO EACH NOSTRIL ONCE A DAY   . Fluticasone Furoate (ARNUITY ELLIPTA) 100 MCG/ACT AEPB Inhale 1 puff into the lungs daily.   . hydrochlorothiazide (MICROZIDE) 12.5 MG capsule TAKE 1 CAPSULE(12.5 MG) BY MOUTH DAILY   . losartan (COZAAR) 25 MG tablet Take 1 tablet (25 mg total) by mouth daily.   Marland Kitchen nystatin (MYCOSTATIN/NYSTOP) powder Apply 1 g topically 2 (two) times daily.    Marland Kitchen omeprazole (PRILOSEC) 20 MG capsule Take 20 mg by mouth daily.   03/09/2017: Takes about qod  . phentermine 15 MG capsule TAKE 1 CAPSULE BY MOUTH EVERY DAY   . topiramate (TOPAMAX) 25 MG tablet Take 3 tablets (75 mg total) by mouth daily.   . vitamin B-12 (CYANOCOBALAMIN) 1000 MCG tablet Take 1,000 mcg by mouth daily.     . vitamin C (ASCORBIC ACID) 500 MG tablet Take 500 mg by mouth daily.   . [DISCONTINUED] citalopram (CELEXA) 40 MG tablet TAKE 1  TABLET BY MOUTH ONCE DAILY   . [DISCONTINUED] hydrochlorothiazide (MICROZIDE) 12.5 MG capsule TAKE 1 CAPSULE(12.5 MG) BY MOUTH DAILY   . [DISCONTINUED] metFORMIN (GLUCOPHAGE-XR) 500 MG 24 hr tablet TAKE 2 TABLETS BY MOUTH EVERY MORNING AND 1 TABLET EVERY EVENING   . [DISCONTINUED] phentermine 15 MG capsule TAKE 1 CAPSULE BY MOUTH EVERY DAY   . [DISCONTINUED] topiramate (TOPAMAX) 25 MG tablet Take 3 tablets (75 mg total) by mouth daily.    No facility-administered encounter medications on file as of 12/09/2017.    Allergies  Allergen Reactions  . Meloxicam Itching    ROS: no fever, chills, URI symptoms, headaches, dizziness, chest pain, palpitations nausea, vomiting, bowel changes, bleeding, bruising, rash.  See HPI   PHYSICAL EXAM:  BP 120/74   Pulse 80   Resp 16   Ht 5' 4"  (1.626 m)   Wt 242 lb (109.8  kg)   LMP 02/09/2009 (Exact Date)   SpO2 97%   BMI 41.54 kg/m   Wt Readings from Last 3 Encounters:  12/09/17 242 lb (109.8 kg)  11/12/17 244 lb 6.4 oz (110.9 kg)  09/08/17 239 lb (108.4 kg)    HEENT: EOMI, conjunctiva and sclera are clear. Neck: no lymphadenopathy, thyromegaly or mass Heart: regular rate and rhythm, no murmur or ectopy Lungs: clear bilaterally Back: no CVA tenderness Abdomen: soft, nontender, no mass Extremities: no edema Psych: normal mood, affect, hygiene, grooming, eye contact, speech Neuro: alert and oriented, normal strength, gait, cranial nerves grossly intact.  Lab Results  Component Value Date   HGBA1C 5.4 12/09/2017     ASSESSMENT/PLAN:   Essential hypertension - well controlled - Plan: hydrochlorothiazide (MICROZIDE) 12.5 MG capsule  Mixed hyperlipidemia - TG elevated on last check; nonfasting today; Cont current meds, lowfat diet  Elevated LFTs - Plan: Hepatic function panel  BMI 40.0-44.9, adult ([place] [place]) - Starting Noom; will cont phentermine and topiramate for the first 3 mos, then rec tapering off phentermine - Plan: phentermine 15  MG capsule, topiramate (TOPAMAX) 25 MG tablet, Hemoglobin A1c  Depression, major, in remission ([place]) - Plan: citalopram (CELEXA) 40 MG tablet  Anxiety state - Plan: citalopram (CELEXA) 40 MG tablet  Insulin resistance syndrome - continue metformin - Plan: Hemoglobin A1c  Medication monitoring encounter - Plan: Hepatic function panel, Hemoglobin A1c  Hepatic steatosis - noted on recent CT (and fatty liver suspected due to elevated LFT's and obesity). Working on [employer] loss   Recheck LFT's (fatty liver noted on recent CT)  Needs refill phentermine topirimate, citalopram, HCTZ  Discussed cont meds x 3 mos as she is on this new program (so as not to influence results of new progam).   Spoke informally with Dr. Leafy Ro at Obesity symposium evening after OV; she often will taper off the phentermine (which can lose efficacy) continue topiramate. Sounds like a good plan, as we had already discussed tapering off meds.  Return fasting in 3 mos--lipids

## 2017-12-09 ENCOUNTER — Other Ambulatory Visit: Payer: Self-pay | Admitting: Family Medicine

## 2017-12-09 ENCOUNTER — Ambulatory Visit: Payer: BLUE CROSS/BLUE SHIELD | Admitting: Family Medicine

## 2017-12-09 VITALS — BP 120/74 | HR 80 | Resp 16 | Ht 64.0 in | Wt 242.0 lb

## 2017-12-09 DIAGNOSIS — I10 Essential (primary) hypertension: Secondary | ICD-10-CM | POA: Diagnosis not present

## 2017-12-09 DIAGNOSIS — F325 Major depressive disorder, single episode, in full remission: Secondary | ICD-10-CM | POA: Diagnosis not present

## 2017-12-09 DIAGNOSIS — R7989 Other specified abnormal findings of blood chemistry: Secondary | ICD-10-CM

## 2017-12-09 DIAGNOSIS — E8881 Metabolic syndrome: Secondary | ICD-10-CM

## 2017-12-09 DIAGNOSIS — F411 Generalized anxiety disorder: Secondary | ICD-10-CM

## 2017-12-09 DIAGNOSIS — K760 Fatty (change of) liver, not elsewhere classified: Secondary | ICD-10-CM

## 2017-12-09 DIAGNOSIS — E782 Mixed hyperlipidemia: Secondary | ICD-10-CM | POA: Diagnosis not present

## 2017-12-09 DIAGNOSIS — R945 Abnormal results of liver function studies: Secondary | ICD-10-CM

## 2017-12-09 DIAGNOSIS — Z6841 Body Mass Index (BMI) 40.0 and over, adult: Secondary | ICD-10-CM | POA: Diagnosis not present

## 2017-12-09 DIAGNOSIS — Z5181 Encounter for therapeutic drug level monitoring: Secondary | ICD-10-CM

## 2017-12-09 MED ORDER — TOPIRAMATE 25 MG PO TABS: 75.0000 mg | ORAL_TABLET | Freq: Every day | ORAL | 2 refills | Status: DC

## 2017-12-09 MED ORDER — CITALOPRAM HYDROBROMIDE 40 MG PO TABS: 40.0000 mg | ORAL_TABLET | Freq: Every day | ORAL | 11 refills | Status: DC

## 2017-12-09 MED ORDER — PHENTERMINE HCL 15 MG PO CAPS: ORAL_CAPSULE | ORAL | 2 refills | Status: DC

## 2017-12-09 MED ORDER — HYDROCHLOROTHIAZIDE 12.5 MG PO CAPS: ORAL_CAPSULE | ORAL | 5 refills | Status: DC

## 2017-12-10 LAB — HEPATIC FUNCTION PANEL
ALK PHOS: 95 IU/L (ref 39–117)
ALT: 59 IU/L — AB (ref 0–32)
AST: 41 IU/L — AB (ref 0–40)
Albumin: 4.8 g/dL (ref 3.5–5.5)
BILIRUBIN TOTAL: 0.2 mg/dL (ref 0.0–1.2)
BILIRUBIN, DIRECT: 0.09 mg/dL (ref 0.00–0.40)
Total Protein: 7.6 g/dL (ref 6.0–8.5)

## 2017-12-10 LAB — HEMOGLOBIN A1C
Est. average glucose Bld gHb Est-mCnc: 108 mg/dL
Hgb A1c MFr Bld: 5.4 % (ref 4.8–5.6)

## 2018-01-07 ENCOUNTER — Other Ambulatory Visit: Payer: Self-pay | Admitting: Family Medicine

## 2018-01-07 DIAGNOSIS — E8881 Metabolic syndrome: Secondary | ICD-10-CM

## 2018-01-20 DIAGNOSIS — M545 Low back pain: Secondary | ICD-10-CM | POA: Diagnosis not present

## 2018-01-20 DIAGNOSIS — M5126 Other intervertebral disc displacement, lumbar region: Secondary | ICD-10-CM | POA: Diagnosis not present

## 2018-01-20 DIAGNOSIS — M47816 Spondylosis without myelopathy or radiculopathy, lumbar region: Secondary | ICD-10-CM | POA: Diagnosis not present

## 2018-02-02 DIAGNOSIS — M545 Low back pain: Secondary | ICD-10-CM | POA: Diagnosis not present

## 2018-02-02 DIAGNOSIS — M5126 Other intervertebral disc displacement, lumbar region: Secondary | ICD-10-CM | POA: Diagnosis not present

## 2018-02-16 DIAGNOSIS — G4733 Obstructive sleep apnea (adult) (pediatric): Secondary | ICD-10-CM | POA: Diagnosis not present

## 2018-02-23 ENCOUNTER — Ambulatory Visit: Payer: BLUE CROSS/BLUE SHIELD | Admitting: Internal Medicine

## 2018-02-24 DIAGNOSIS — M47816 Spondylosis without myelopathy or radiculopathy, lumbar region: Secondary | ICD-10-CM | POA: Diagnosis not present

## 2018-02-24 DIAGNOSIS — M545 Low back pain: Secondary | ICD-10-CM | POA: Diagnosis not present

## 2018-03-06 NOTE — Progress Notes (Signed)
Chief Complaint  Patient presents with  . Weight Loss    fasting med check. No concerns.    Patient presents for 3 month f/u on her weight, since starting Noom. She admits that she hasn't lost weight, but that she is eating better--more fruit/vegetables, cut out a lot of processed stuff, and cut out diet soda.  Drinking water instead.  She continues on topiramate and phentermine. Altered taste continues  She talks with goal coach weekly--got her through some "pity parties" Admits she doesn't have the willpower all the time. Trip to [place]--portions were not as good.  Needs to work harder on portions.  Hasn't been exercising because of her back. Had epidural 4-6 weeks ago, scheduled for a facet injection on Friday.  Pain with walking, elliptical, got out of habit of going to the gym.  At last visit we had discussed tapering off phentermine (lost efficacy), but continue the topamax.  She didn't want to make changes while starting this new program, so both were continued for the last 3 months, while starting Noom.  Noted to have hepatic steatosis on abdominal CT.  Has had some mild pain, which she relates to mild constipation, if she isn't drinking enough water. Lab Results  Component Value Date   ALT 59 (H) 12/09/2017   AST 41 (H) 12/09/2017   ALKPHOS 95 12/09/2017   BILITOT 0.2 12/09/2017    Depression/anxiety: On Wellbutrin and citalopram. Denies side effects.Doing well overall.  Needs xanax refill, last filled #20 six months ago.  Hypertension: BP's remain well controlled on 12.71m of HCTZ and losartan.   Hyperlipidemia follow-up: Patient is reportedly following a low-fat, low cholesterol diet. Compliant with medications and denies medication side effects. Triglycerides were up on last check. She is due for fasting lipids Lab Results  Component Value Date   CHOL 176 09/07/2017   HDL 41 09/07/2017   LDLCALC 91 09/07/2017   TRIG 220 (H) 09/07/2017   CHOLHDL 4.3  09/07/2017    PCOS/insulin resistance: Compliant with metformin. Denies polydipsia, polyuria. She has some tingling in her feet (on both feet, between the great and 2nd toes (medially), ongoing, without change. Lab Results  Component Value Date   HGBA1C 5.4 12/09/2017    PMH, PSH, SH reviewed  Outpatient Encounter Medications as of 03/07/2018  Medication Sig Note  . ALPRAZolam (XANAX) 0.5 MG tablet TAKE 1/2 -1 TABLET BY MOUTH 3 TIMES DAILY AS NEEDED FOR ANXIETY   . atorvastatin (LIPITOR) 40 MG tablet Take 1 tablet (40 mg total) by mouth daily.   .Marland KitchenbuPROPion (WELLBUTRIN XL) 300 MG 24 hr tablet Take 1 tablet (300 mg total) by mouth daily.   . Calcium Carbonate-Vitamin D (CALCIUM + D) 600-200 MG-UNIT TABS Take 2 tablets by mouth daily.     . cetirizine (ZYRTEC) 10 MG tablet Take 10 mg by mouth daily.     . citalopram (CELEXA) 40 MG tablet Take 1 tablet (40 mg total) by mouth daily.   . diclofenac (VOLTAREN) 75 MG EC tablet Take 1 tablet (75 mg total) by mouth 2 (two) times daily.   . DULERA 100-5 MCG/ACT AERO INHALE 2 PUFFS BY MOUTH TWICE DAILY   . Ferrous Sulfate (IRON) 325 (65 Fe) MG TABS Take 1 tablet by mouth daily.   . fish oil-omega-3 fatty acids 1000 MG capsule Take 3 g by mouth daily.    . flintstones complete (FLINTSTONES) 60 MG chewable tablet Chew 1 tablet by mouth daily.   . fluticasone (FLONASE) 50 MCG/ACT  nasal spray INSTILL 1 TO 2 SPRAYS INTO EACH NOSTRIL ONCE A DAY   . hydrochlorothiazide (MICROZIDE) 12.5 MG capsule TAKE 1 CAPSULE(12.5 MG) BY MOUTH DAILY   . losartan (COZAAR) 25 MG tablet Take 1 tablet (25 mg total) by mouth daily.   . metFORMIN (GLUCOPHAGE-XR) 500 MG 24 hr tablet TAKE 2 TABLETS BY MOUTH EVERY MORNING AND 1 TABLET EVERY EVENING   . omeprazole (PRILOSEC) 20 MG capsule Take 20 mg by mouth daily.   03/09/2017: Takes about qod  . phentermine 15 MG capsule TAKE 1 CAPSULE BY MOUTH EVERY DAY   . topiramate (TOPAMAX) 25 MG tablet Take 3 tablets (75 mg total) by  mouth daily.   . vitamin B-12 (CYANOCOBALAMIN) 1000 MCG tablet Take 1,000 mcg by mouth daily.     . vitamin C (ASCORBIC ACID) 500 MG tablet Take 500 mg by mouth daily.   . [DISCONTINUED] ALPRAZolam (XANAX) 0.5 MG tablet TAKE 1/2 -1 TABLET BY MOUTH 3 TIMES DAILY AS NEEDED FOR ANXIETY   . [DISCONTINUED] atorvastatin (LIPITOR) 40 MG tablet Take 1 tablet (40 mg total) by mouth daily.   . [DISCONTINUED] buPROPion (WELLBUTRIN XL) 300 MG 24 hr tablet Take 1 tablet (300 mg total) by mouth daily.   . [DISCONTINUED] diclofenac (VOLTAREN) 75 MG EC tablet Take 1 tablet (75 mg total) by mouth 2 (two) times daily.   . [DISCONTINUED] losartan (COZAAR) 25 MG tablet Take 1 tablet (25 mg total) by mouth daily.   . [DISCONTINUED] metFORMIN (GLUCOPHAGE-XR) 500 MG 24 hr tablet TAKE 2 TABLETS BY MOUTH EVERY MORNING AND 1 TABLET EVERY EVENING   . [DISCONTINUED] topiramate (TOPAMAX) 25 MG tablet Take 3 tablets (75 mg total) by mouth daily.   . Fluticasone Furoate (ARNUITY ELLIPTA) 100 MCG/ACT AEPB Inhale 1 puff into the lungs daily. (Patient not taking: Reported on 03/07/2018)   . nystatin (MYCOSTATIN/NYSTOP) powder Apply 1 g topically 2 (two) times daily.    . [DISCONTINUED] cyclobenzaprine (FLEXERIL) 5 MG tablet as needed. Reported on 09/02/2015 02/14/2014: Takes prn back pain (last rx'd by Dr. Leo Grosser; also helps with endometriosis pain)   No facility-administered encounter medications on file as of 03/07/2018.    Allergies  Allergen Reactions  . Meloxicam Itching    ROS: no fever, chills, URI symptoms, asthma flare, chest pain, palpitations, GI or GU complaints. See HPI. +back pain.   PHYSICAL EXAM:  BP 124/78   Pulse 88   Ht 5' 4"  (1.626 m)   Wt 243 lb 6.4 oz (110.4 kg)   LMP 02/09/2009 (Exact Date)   BMI 41.78 kg/m   Wt Readings from Last 3 Encounters:  03/07/18 243 lb 6.4 oz (110.4 kg)  12/09/17 242 lb (109.8 kg)  11/12/17 244 lb 6.4 oz (110.9 kg)   Well appearing, pleasant female in good spirits, in  no distress HEENT: EOMI, conjunctiva and sclera are clear. Neck: no lymphadenopathy, thyromegaly or mass Heart: regular rate and rhythm, no murmur or ectopy Lungs: clear bilaterally Back: no CVA tenderness, no spinal tenderness or muscle spasm Abdomen: soft, nontender, no mass Extremities: no edema Psych: normal mood, affect, hygiene, grooming, eye contact, speech Neuro: alert and oriented, normal strength, gait, cranial nerves grossly intact. Skin:  2cm area right upper lateral leg that is somewhat excoriated Top layer removed centrally, hard to tell if verrucous, vs eczematous in the current state.   ASSESSMENT/PLAN:  Mixed hyperlipidemia - due for recheck. lowfat, low cholesterol diet recommended; cont statin fish oil - Plan: Lipid panel, Hepatic function  panel, atorvastatin (LIPITOR) 40 MG tablet  Essential hypertension - well controlled - Plan: losartan (COZAAR) 25 MG tablet  Hepatic steatosis - risks reviewed, counseled re: weight loss - Plan: Hepatic function panel  Elevated LFTs - Plan: Hepatic function panel  Need for influenza vaccination - Plan: Flu Vaccine QUAD 6+ mos PF IM (Fluarix Quad PF)  Depression, major, in remission ([place]) - Plan: buPROPion (WELLBUTRIN XL) 300 MG 24 hr tablet  Chronic low back pain, unspecified back pain laterality, with sciatica presence unspecified - rec recumbent bike if other cardio cause pain. f/u as planned for injection - Plan: diclofenac (VOLTAREN) 75 MG EC tablet  Insulin resistance syndrome - Plan: metFORMIN (GLUCOPHAGE-XR) 500 MG 24 hr tablet  BMI 40.0-44.9, adult ([facility]) - stop phentermine. Cont coaching with [facility]. Continue topamax - Plan: topiramate (TOPAMAX) 25 MG tablet  Anxiety state - continue stress reduction techniques. rare use of alprazolam prn - Plan: ALPRAZolam (XANAX) 0.5 MG tablet  Stop the phentermine. If you would like to try tapering the topiramate to see how you feel (I'm okay with you staying on this), you could cut  back to 2 pills for a week, then further (if desired) to 1 pills every night for a week, then stop entirely.   F/u 3 months.

## 2018-03-07 ENCOUNTER — Encounter: Payer: Self-pay | Admitting: Family Medicine

## 2018-03-07 ENCOUNTER — Ambulatory Visit: Payer: BLUE CROSS/BLUE SHIELD | Admitting: Family Medicine

## 2018-03-07 VITALS — BP 124/78 | HR 88 | Ht 64.0 in | Wt 243.4 lb

## 2018-03-07 DIAGNOSIS — K760 Fatty (change of) liver, not elsewhere classified: Secondary | ICD-10-CM

## 2018-03-07 DIAGNOSIS — Z6841 Body Mass Index (BMI) 40.0 and over, adult: Secondary | ICD-10-CM

## 2018-03-07 DIAGNOSIS — I10 Essential (primary) hypertension: Secondary | ICD-10-CM

## 2018-03-07 DIAGNOSIS — R945 Abnormal results of liver function studies: Secondary | ICD-10-CM

## 2018-03-07 DIAGNOSIS — Z23 Encounter for immunization: Secondary | ICD-10-CM | POA: Diagnosis not present

## 2018-03-07 DIAGNOSIS — F411 Generalized anxiety disorder: Secondary | ICD-10-CM

## 2018-03-07 DIAGNOSIS — E8881 Metabolic syndrome: Secondary | ICD-10-CM

## 2018-03-07 DIAGNOSIS — R7989 Other specified abnormal findings of blood chemistry: Secondary | ICD-10-CM

## 2018-03-07 DIAGNOSIS — E782 Mixed hyperlipidemia: Secondary | ICD-10-CM

## 2018-03-07 DIAGNOSIS — G8929 Other chronic pain: Secondary | ICD-10-CM

## 2018-03-07 DIAGNOSIS — F325 Major depressive disorder, single episode, in full remission: Secondary | ICD-10-CM

## 2018-03-07 DIAGNOSIS — M545 Low back pain: Secondary | ICD-10-CM

## 2018-03-07 MED ORDER — BUPROPION HCL ER (XL) 300 MG PO TB24: 300.0000 mg | ORAL_TABLET | Freq: Every day | ORAL | 5 refills | Status: DC

## 2018-03-07 MED ORDER — LOSARTAN POTASSIUM 25 MG PO TABS: 25.0000 mg | ORAL_TABLET | Freq: Every day | ORAL | 5 refills | Status: DC

## 2018-03-07 MED ORDER — TOPIRAMATE 25 MG PO TABS: 75.0000 mg | ORAL_TABLET | Freq: Every day | ORAL | 2 refills | Status: DC

## 2018-03-07 MED ORDER — ALPRAZOLAM 0.5 MG PO TABS: ORAL_TABLET | ORAL | 0 refills | Status: DC

## 2018-03-07 MED ORDER — DICLOFENAC SODIUM 75 MG PO TBEC: 75.0000 mg | DELAYED_RELEASE_TABLET | Freq: Two times a day (BID) | ORAL | 5 refills | Status: DC

## 2018-03-07 MED ORDER — METFORMIN HCL ER 500 MG PO TB24: ORAL_TABLET | ORAL | 5 refills | Status: DC

## 2018-03-07 MED ORDER — ATORVASTATIN CALCIUM 40 MG PO TABS: 40.0000 mg | ORAL_TABLET | Freq: Every day | ORAL | 5 refills | Status: DC

## 2018-03-07 NOTE — Patient Instructions (Addendum)
  Stop the phentermine. If you would like to try tapering the topiramate to see how you feel (I'm okay with you staying on this), you could cut back to 2 pills for a week, then further (if desired) to 1 pills every night for a week, then stop entirely.  Look up "tapping" for anxiety.  Try getting back to the gym and getting on the recumbent bike, as this may not bother your back like the other machines.

## 2018-03-08 ENCOUNTER — Encounter: Payer: Self-pay | Admitting: Family Medicine

## 2018-03-08 LAB — HEPATIC FUNCTION PANEL
ALT: 45 IU/L — ABNORMAL HIGH (ref 0–32)
AST: 34 IU/L (ref 0–40)
Albumin: 4.6 g/dL (ref 3.5–5.5)
Alkaline Phosphatase: 97 IU/L (ref 39–117)
Bilirubin Total: 0.2 mg/dL (ref 0.0–1.2)
Bilirubin, Direct: 0.09 mg/dL (ref 0.00–0.40)
Total Protein: 7.2 g/dL (ref 6.0–8.5)

## 2018-03-08 LAB — LIPID PANEL
Chol/HDL Ratio: 4.9 ratio — ABNORMAL HIGH (ref 0.0–4.4)
Cholesterol, Total: 211 mg/dL — ABNORMAL HIGH (ref 100–199)
HDL: 43 mg/dL (ref >39)
LDL Calculated: 103 mg/dL — ABNORMAL HIGH (ref 0–99)
Triglycerides: 325 mg/dL — ABNORMAL HIGH (ref 0–149)
VLDL Cholesterol Cal: 65 mg/dL — ABNORMAL HIGH (ref 5–40)

## 2018-03-11 DIAGNOSIS — M47816 Spondylosis without myelopathy or radiculopathy, lumbar region: Secondary | ICD-10-CM | POA: Diagnosis not present

## 2018-03-11 DIAGNOSIS — M545 Low back pain: Secondary | ICD-10-CM | POA: Diagnosis not present

## 2018-03-16 ENCOUNTER — Encounter: Payer: Self-pay | Admitting: Family Medicine

## 2018-03-16 DIAGNOSIS — R945 Abnormal results of liver function studies: Secondary | ICD-10-CM

## 2018-03-16 DIAGNOSIS — Z5181 Encounter for therapeutic drug level monitoring: Secondary | ICD-10-CM

## 2018-03-16 DIAGNOSIS — E8881 Metabolic syndrome: Secondary | ICD-10-CM

## 2018-03-16 DIAGNOSIS — E782 Mixed hyperlipidemia: Secondary | ICD-10-CM

## 2018-03-16 DIAGNOSIS — R7989 Other specified abnormal findings of blood chemistry: Secondary | ICD-10-CM

## 2018-03-17 ENCOUNTER — Other Ambulatory Visit: Payer: Self-pay | Admitting: *Deleted

## 2018-03-17 MED ORDER — ATORVASTATIN CALCIUM 80 MG PO TABS: 80.0000 mg | ORAL_TABLET | Freq: Every day | ORAL | 0 refills | Status: DC

## 2018-03-20 NOTE — Telephone Encounter (Signed)
Orders entered for c-met, lipid, A1c

## 2018-03-30 MED ORDER — FLUTICASONE FUROATE 100 MCG/ACT IN AEPB: 1.0000 | INHALATION_SPRAY | Freq: Every day | RESPIRATORY_TRACT | 3 refills | Status: DC

## 2018-04-11 DIAGNOSIS — M545 Low back pain: Secondary | ICD-10-CM | POA: Diagnosis not present

## 2018-04-11 DIAGNOSIS — M47816 Spondylosis without myelopathy or radiculopathy, lumbar region: Secondary | ICD-10-CM | POA: Diagnosis not present

## 2018-04-19 ENCOUNTER — Encounter: Payer: Self-pay | Admitting: Internal Medicine

## 2018-04-20 ENCOUNTER — Encounter: Payer: Self-pay | Admitting: Internal Medicine

## 2018-04-20 ENCOUNTER — Ambulatory Visit (INDEPENDENT_AMBULATORY_CARE_PROVIDER_SITE_OTHER): Payer: BLUE CROSS/BLUE SHIELD | Admitting: Internal Medicine

## 2018-04-20 VITALS — BP 138/78 | HR 89 | Ht 64.0 in | Wt 246.2 lb

## 2018-04-20 DIAGNOSIS — G4733 Obstructive sleep apnea (adult) (pediatric): Secondary | ICD-10-CM

## 2018-04-20 DIAGNOSIS — J4540 Moderate persistent asthma, uncomplicated: Secondary | ICD-10-CM | POA: Diagnosis not present

## 2018-04-20 NOTE — Assessment & Plan Note (Signed)
She benefits from CPAP with improved sleep quality and does not sleep without it.  Download confirms excellent compliance and control. Plan-continue AutoPap 5-20

## 2018-04-20 NOTE — Progress Notes (Signed)
HPI female never smoker followed for OSA, complicated by obesity, GERD, HBP Followed by Dr. Chase Caller for asthma NPSG 04/23/06- AHI 42/ hr, desat to 89% --------------------------------------------------------------------------- 02/23/17 - 52 year old female never smoker followed for OSA, complicated by obesity, GERD, HBP Followed by Dr. Chase Caller for asthma CPAP auto 5-20/[facility] FOLLOWS FOR: DME [facility]. DL attached and wears CPAP nightly. No new supplies needed at this time.  She says she is doing "great" with her CPAP, very comfortable. The thought of sleeping without it makes her anxious. Download 100% compliance averaging over 8 hours per night. AHI 1.3/hour. No recent asthma or other respiratory discomforts.  04/20/2018- 52 year old female never smoker followed for OSA, complicated by obesity, GERD, HBP Followed by Dr. Chase Caller for asthma CPAP auto 5-20/ [place] supplies on line. [place] Download 100% compliance AHI 1.5/hour.  She does not sleep without CPAP and feels that it improves her sleep quality significantly.  Machine is about 78 or 54 years old.  She had questions about CPAP cleaning devices.  ROS-see HPI   + = positive Constitutional:    weight loss, night sweats, fevers, chills, fatigue, lassitude. HEENT:    headaches, difficulty swallowing, tooth/dental problems, sore throat,       sneezing, itching, ear ache, nasal congestion, post nasal drip, snoring CV:    chest pain, orthopnea, PND, swelling in lower extremities, anasarca,                                                       dizziness, palpitations Resp:   shortness of breath with exertion or at rest.                productive cough,   non-productive cough, coughing up of blood.              change in color of mucus.  wheezing.   Skin:    rash or lesions. GI:  No-   heartburn, indigestion, abdominal pain, nausea, vomiting, diarrhea,                 change in bowel habits, loss of  appetite GU: dysuria, change in color of urine, no urgency or frequency.   flank pain. MS:   joint pain, stiffness, decreased range of motion, back pain. Neuro-     nothing unusual Psych:  change in mood or affect.  depression or anxiety.   memory loss.  OBJ- Physical Exam General- Alert, Oriented, Affect-appropriate, Distress- none acute + obese Skin- rash-none, lesions- none, excoriation- none Lymphadenopathy- none Head- atraumatic            Eyes- Gross vision intact, PERRLA, conjunctivae and secretions clear            Ears- Hearing, canals-normal            Nose- Clear, no-Septal dev, mucus, polyps, erosion, perforation             Throat- Mallampati III-IV , mucosa clear , drainage- none, tonsils- atrophic Neck- flexible , trachea midline, no stridor , thyroid nl, carotid no bruit Chest - symmetrical excursion , unlabored           Heart/CV- RRR , no murmur , no gallop  , no rub, nl s1 s2                           -  JVD- none , edema- none, stasis changes- none, varices- none           Lung- clear to P&A, wheeze- none, cough- none , dullness-none, rub- none           Chest wall-  Abd-  Br/ Gen/ Rectal- Not done, not indicated Extrem- cyanosis- none, clubbing, none, atrophy- none, strength- nl Neuro- grossly intact to observation

## 2018-04-20 NOTE — Patient Instructions (Signed)
We can continue CPAP auto 5-20, mask of choice, humidifier, supplies, [employer]View  Please call if we can help

## 2018-04-20 NOTE — Assessment & Plan Note (Signed)
She is very clear on exam today and denies recent exacerbations.  She will return for follow-up with Dr. Chase Caller

## 2018-04-21 ENCOUNTER — Other Ambulatory Visit: Payer: Self-pay

## 2018-04-21 DIAGNOSIS — G4733 Obstructive sleep apnea (adult) (pediatric): Secondary | ICD-10-CM

## 2018-05-02 DIAGNOSIS — R1031 Right lower quadrant pain: Secondary | ICD-10-CM | POA: Diagnosis not present

## 2018-05-02 DIAGNOSIS — Z01419 Encounter for gynecological examination (general) (routine) without abnormal findings: Secondary | ICD-10-CM | POA: Diagnosis not present

## 2018-05-02 DIAGNOSIS — Z6841 Body Mass Index (BMI) 40.0 and over, adult: Secondary | ICD-10-CM | POA: Diagnosis not present

## 2018-05-02 DIAGNOSIS — Z124 Encounter for screening for malignant neoplasm of cervix: Secondary | ICD-10-CM | POA: Diagnosis not present

## 2018-05-02 LAB — HM PAP SMEAR: HM Pap smear: NORMAL

## 2018-05-11 DIAGNOSIS — R1031 Right lower quadrant pain: Secondary | ICD-10-CM | POA: Diagnosis not present

## 2018-05-11 DIAGNOSIS — E119 Type 2 diabetes mellitus without complications: Secondary | ICD-10-CM | POA: Diagnosis not present

## 2018-05-24 ENCOUNTER — Telehealth: Payer: Self-pay | Admitting: Family Medicine

## 2018-05-24 ENCOUNTER — Other Ambulatory Visit: Payer: Self-pay | Admitting: Obstetrics and Gynecology

## 2018-05-24 DIAGNOSIS — Z1231 Encounter for screening mammogram for malignant neoplasm of breast: Secondary | ICD-10-CM

## 2018-05-24 NOTE — Telephone Encounter (Signed)
Received requested pap from [facility]. Sending back for review.

## 2018-05-25 ENCOUNTER — Ambulatory Visit
Admission: RE | Admit: 2018-05-25 | Discharge: 2018-05-25 | Disposition: A | Discharge status: Home / Self Care | Payer: BLUE CROSS/BLUE SHIELD | Source: Ambulatory Visit

## 2018-05-25 DIAGNOSIS — Z1231 Encounter for screening mammogram for malignant neoplasm of breast: Secondary | ICD-10-CM

## 2018-05-30 ENCOUNTER — Encounter: Payer: Self-pay | Admitting: *Deleted

## 2018-06-03 ENCOUNTER — Other Ambulatory Visit: Payer: BLUE CROSS/BLUE SHIELD

## 2018-06-03 DIAGNOSIS — E782 Mixed hyperlipidemia: Secondary | ICD-10-CM | POA: Diagnosis not present

## 2018-06-03 DIAGNOSIS — R945 Abnormal results of liver function studies: Secondary | ICD-10-CM

## 2018-06-03 DIAGNOSIS — R7989 Other specified abnormal findings of blood chemistry: Secondary | ICD-10-CM

## 2018-06-03 DIAGNOSIS — E8881 Metabolic syndrome: Secondary | ICD-10-CM | POA: Diagnosis not present

## 2018-06-03 DIAGNOSIS — Z5181 Encounter for therapeutic drug level monitoring: Secondary | ICD-10-CM

## 2018-06-04 LAB — LIPID PANEL
CHOL/HDL RATIO: 3.8 ratio (ref 0.0–4.4)
Cholesterol, Total: 177 mg/dL (ref 100–199)
HDL: 47 mg/dL (ref >39)
LDL Calculated: 86 mg/dL (ref 0–99)
Triglycerides: 221 mg/dL — ABNORMAL HIGH (ref 0–149)
VLDL CHOLESTEROL CAL: 44 mg/dL — AB (ref 5–40)

## 2018-06-04 LAB — COMPREHENSIVE METABOLIC PANEL
ALT: 61 IU/L — AB (ref 0–32)
AST: 49 IU/L — AB (ref 0–40)
Albumin/Globulin Ratio: 1.8 (ref 1.2–2.2)
Albumin: 4.5 g/dL (ref 3.5–5.5)
Alkaline Phosphatase: 105 IU/L (ref 39–117)
BILIRUBIN TOTAL: 0.3 mg/dL (ref 0.0–1.2)
BUN / CREAT RATIO: 11 (ref 9–23)
BUN: 12 mg/dL (ref 6–24)
CO2: 24 mmol/L (ref 20–29)
Calcium: 9.9 mg/dL (ref 8.7–10.2)
Chloride: 101 mmol/L (ref 96–106)
Creatinine, Ser: 1.06 mg/dL — ABNORMAL HIGH (ref 0.57–1.00)
GFR calc Af Amer: 69 mL/min/{1.73_m2} (ref >59)
GFR calc non Af Amer: 60 mL/min/{1.73_m2} (ref >59)
GLOBULIN, TOTAL: 2.5 g/dL (ref 1.5–4.5)
Glucose: 86 mg/dL (ref 65–99)
Potassium: 4.7 mmol/L (ref 3.5–5.2)
Sodium: 141 mmol/L (ref 134–144)
Total Protein: 7 g/dL (ref 6.0–8.5)

## 2018-06-04 LAB — HEMOGLOBIN A1C
Est. average glucose Bld gHb Est-mCnc: 120 mg/dL
HEMOGLOBIN A1C: 5.8 % — AB (ref 4.8–5.6)

## 2018-06-05 NOTE — Progress Notes (Signed)
Chief Complaint  Patient presents with  . Diabetes    nonfasting med check.     Obesity: She started [employer] program 11/2017.  At her last visit 3 months ago she reported eating better--more fruit/vegetables, cut out a lot of processed stuff, and cut out diet soda. She is back to drinking some diet Ginger Ale.  She felt like her appetite was less when she wasn't drinking soda. She is no longer following the [facility] program. She felt like it was a stressor that she needed to cut out, so stopped about 3-4 weeks ago (having to be accountable, taking the time, caused stress). She admits to stress eating, easy/quick options. She describes "massive laziness". She has not been exercising at all.  She had epidural and facet injection--only slight improvement.  Pain recurred when she went back to the gym to the treadmill.  Didn't try the bike, which she knows she probably could tolerate.  Previously had said that talking with goal coach weekly (through [facility]) got her through some "pity parties"  At her last visit we stopped the phentermine, and since then she tapered off the topamax--has been completely off the topamax for about 3 weeks.  This combination (generic for the Qsymia) stopped being effective.  Noted to have hepatic steatosis on abdominal CT, and has had elevated LFT's.  She had repeat labs prior to visit, see below. RecentLabs       Lab Results  Component Value Date   ALT 59 (H) 12/09/2017   AST 41 (H) 12/09/2017   ALKPHOS 95 12/09/2017   BILITOT 0.2 12/09/2017     Depression/anxiety: On Wellbutrin and citalopram. Denies side effects. Some increase work stress and depression, contributed by not being to exercise the way she wants to, due to back pain.  Xanax was refilled at her visit 3 months ago (#20 on 03/07/18). She has plenty left, and 1/2 tablet is effective.  Hypertension: BP's remain well controlled on 12.78m of HCTZ and losartan. Denies side effects, headaches, dizziness, chest  pain palpitations, edema.  Hyperlipidemia follow-up: Patient is reportedly following a low-fat, low cholesterol diet. Compliant with medications--lipitor dose was increased to 875m tolerating without side effects. She also continues to take fish oil 3/d). She had labs done prior to visit, see below.  PCOS/insulin resistance: Compliant with metformin. Denies polydipsia, polyuria. A1c was 5.4 in June.  See below for labs done prior to visit   PMH, [place]  Outpatient Encounter Medications as of 06/06/2018  Medication Sig Note  . ALPRAZolam (XANAX) 0.5 MG tablet TAKE 1/2 -1 TABLET BY MOUTH 3 TIMES DAILY AS NEEDED FOR ANXIETY   . atorvastatin (LIPITOR) 80 MG tablet Take 1 tablet (80 mg total) by mouth daily.   . Marland KitchenuPROPion (WELLBUTRIN XL) 300 MG 24 hr tablet Take 1 tablet (300 mg total) by mouth daily.   . Calcium Carbonate-Vitamin D (CALCIUM + D) 600-200 MG-UNIT TABS Take 2 tablets by mouth daily.     . cetirizine (ZYRTEC) 10 MG tablet Take 10 mg by mouth daily.     . citalopram (CELEXA) 40 MG tablet Take 1 tablet (40 mg total) by mouth daily.   . diclofenac (VOLTAREN) 75 MG EC tablet Take 1 tablet (75 mg total) by mouth 2 (two) times daily.   . Ferrous Sulfate (IRON) 325 (65 Fe) MG TABS Take 1 tablet by mouth daily.   . fish oil-omega-3 fatty acids 1000 MG capsule Take 3 g by mouth daily.    . flintstones  complete (FLINTSTONES) 60 MG chewable tablet Chew 1 tablet by mouth daily.   . fluticasone (FLONASE) 50 MCG/ACT nasal spray INSTILL 1 TO 2 SPRAYS INTO EACH NOSTRIL ONCE A DAY   . Fluticasone Furoate (ARNUITY ELLIPTA) 100 MCG/ACT AEPB Inhale 1 puff into the lungs daily.   . hydrochlorothiazide (MICROZIDE) 12.5 MG capsule TAKE 1 CAPSULE(12.5 MG) BY MOUTH DAILY   . losartan (COZAAR) 25 MG tablet Take 1 tablet (25 mg total) by mouth daily.   . metFORMIN (GLUCOPHAGE-XR) 500 MG 24 hr tablet TAKE 2 TABLETS BY MOUTH EVERY MORNING AND 1 TABLET EVERY EVENING   . nystatin  (MYCOSTATIN/NYSTOP) powder Apply 1 g topically 2 (two) times daily.    Marland Kitchen omeprazole (PRILOSEC) 20 MG capsule Take 20 mg by mouth daily.   03/09/2017: Takes about qod  . phentermine 15 MG capsule TAKE 1 CAPSULE BY MOUTH EVERY DAY   . topiramate (TOPAMAX) 25 MG tablet Take 3 tablets (75 mg total) by mouth daily.   . vitamin B-12 (CYANOCOBALAMIN) 1000 MCG tablet Take 1,000 mcg by mouth daily.     . vitamin C (ASCORBIC ACID) 500 MG tablet Take 500 mg by mouth daily.    No facility-administered encounter medications on file as of 06/06/2018.    Allergies  Allergen Reactions  . Meloxicam Itching   ROS: no fever, chills, headaches, dizziness, chest pain. She has had some ongoing abdominal pain (she saw her GYN, who sent her to GI, and she is scheduled for colonoscopy next week).  Denies any abnormal bleeding.  +depression, "laziness", stress, back pain per HPI.   PHYSICAL EXAM:  BP 122/74   Pulse 84   Ht 5' 4"  (1.626 m)   Wt 249 lb 9.6 oz (113.2 kg)   LMP 02/09/2009 (Exact Date)   BMI 42.84 kg/m   Wt Readings from Last 3 Encounters:  06/06/18 249 lb 9.6 oz (113.2 kg)  04/20/18 246 lb 3.2 oz (111.7 kg)  03/07/18 243 lb 6.4 oz (110.4 kg)    Well appearing, pleasant female in good spirits, in no distress HEENT: EOMI, conjunctiva and sclera are clear. Neck: no lymphadenopathy, thyromegaly or mass Heart: regular rate and rhythm, no murmur or ectopy Lungs: clear bilaterally Back: no CVA tenderness, no spinal tenderness or muscle spasm Abdomen: soft, nontender, obese, no mass Extremities: no edema Psych: normal mood, affect, hygiene, grooming, eye contact, speech Neuro: alert and oriented, normal strength, gait, cranial nerves grossly intact.   Lab Results  Component Value Date   HGBA1C 5.8 (H) 06/03/2018   Lab Results  Component Value Date   CHOL 177 06/03/2018   HDL 47 06/03/2018   LDLCALC 86 06/03/2018   TRIG 221 (H) 06/03/2018   CHOLHDL 3.8 06/03/2018     Chemistry       Component Value Date/Time   NA 141 06/03/2018 0850   K 4.7 06/03/2018 0850   CL 101 06/03/2018 0850   CO2 24 06/03/2018 0850   BUN 12 06/03/2018 0850   CREATININE 1.06 (H) 06/03/2018 0850   CREATININE 0.97 03/09/2017 1110      Component Value Date/Time   CALCIUM 9.9 06/03/2018 0850   ALKPHOS 105 06/03/2018 0850   AST 49 (H) 06/03/2018 0850   ALT 61 (H) 06/03/2018 0850   BILITOT 0.3 06/03/2018 0850     Fasting glucose 86  ASSESSMENT/PLAN:  Mixed hyperlipidemia - improved with higher atorvastatin dose. TG remain elevated, diet an issue. Try and improve diet, CPM - Plan: atorvastatin (LIPITOR) 80 MG tablet  Elevated LFTs - remain elevated, slightly higher. Discussed risks of fatty liver, and need for weight loss and improved diet/TG at length  Essential hypertension - Plan: hydrochlorothiazide (MICROZIDE) 12.5 MG capsule  Hepatic steatosis  Insulin resistance syndrome - at this point, likely has DM. A1c is higher than on last check; remains on metformin. Discussed exercise, weight loss. Cont Metformin  Depression, major, in remission ([place]) - likely contributing to her "massive laziness"--strongly encouraged counseling  Essential hypertension - well controlled - Plan: hydrochlorothiazide (MICROZIDE) 12.5 MG capsule  BMI 40.0-44.9, adult ([place]) - gaining weight, depression/stress/pain contributing. Encouraged Saxenda (will check coverage); other options discussed included bariatric consult, wt loss clini   We discussed options including Saxenda, [facility] Weight Loss clinic through [facility] (Dr. Recardo Evangelist, and others), Bariatric surgery consult and Weight Watchers. Check on coverage for Saxenda, and if affordable (also check for coupons online)--this may require prior auth, which I think we should be able to get, given that you failed the only other medication option (Contrave and Belviq or not options for you given your other medications).  Given that your weight has  gone up, Triglycerides remain elevated and liver tests are rising, we need to do SOMETHING. I encourage you to get back to counseling to help with your stressors, and whether or not depression/anxiety are contributing to your "laziness" (your word, not mine).  The higher dose of lipitor has helped lower your LDL and triglycerides (though still above goal), and chol/HDL ratio is now bettter. Continue the 78m. Your pre-diabetes has gotten worse (A1c is now 5.8 up from 5.4). SKirke Shaggywill help lower sugar.  Exercise bike, better food choices and portion control also need to be involved.

## 2018-06-06 ENCOUNTER — Encounter: Payer: Self-pay | Admitting: Family Medicine

## 2018-06-06 ENCOUNTER — Ambulatory Visit: Payer: BLUE CROSS/BLUE SHIELD | Admitting: Family Medicine

## 2018-06-06 VITALS — BP 122/74 | HR 84 | Ht 64.0 in | Wt 249.6 lb

## 2018-06-06 DIAGNOSIS — K760 Fatty (change of) liver, not elsewhere classified: Secondary | ICD-10-CM | POA: Diagnosis not present

## 2018-06-06 DIAGNOSIS — E8881 Metabolic syndrome: Secondary | ICD-10-CM

## 2018-06-06 DIAGNOSIS — Z6841 Body Mass Index (BMI) 40.0 and over, adult: Secondary | ICD-10-CM

## 2018-06-06 DIAGNOSIS — R7989 Other specified abnormal findings of blood chemistry: Secondary | ICD-10-CM

## 2018-06-06 DIAGNOSIS — R945 Abnormal results of liver function studies: Secondary | ICD-10-CM | POA: Diagnosis not present

## 2018-06-06 DIAGNOSIS — E782 Mixed hyperlipidemia: Secondary | ICD-10-CM

## 2018-06-06 DIAGNOSIS — I10 Essential (primary) hypertension: Secondary | ICD-10-CM | POA: Diagnosis not present

## 2018-06-06 DIAGNOSIS — F325 Major depressive disorder, single episode, in full remission: Secondary | ICD-10-CM

## 2018-06-06 MED ORDER — ATORVASTATIN CALCIUM 80 MG PO TABS: 80.0000 mg | ORAL_TABLET | Freq: Every day | ORAL | 1 refills | Status: DC

## 2018-06-06 MED ORDER — HYDROCHLOROTHIAZIDE 12.5 MG PO CAPS: ORAL_CAPSULE | ORAL | 1 refills | Status: DC

## 2018-06-06 NOTE — Patient Instructions (Signed)
  We discussed options including Saxenda, Healthy Weight and Weight Loss clinic through [facility] (Dr. Recardo Evangelist, and others), Bariatric surgery consult and Weight Watchers. Check on coverage for Saxenda, and if affordable (also check for coupons online)--this may require prior auth, which I think we should be able to get, given that you failed the only other medication option (Contrave and Belviq or not options for you given your other medications).  Given that your weight has gone up, Triglycerides remain elevated and liver tests are rising, we need to do SOMETHING. I encourage you to get back to counseling to help with your stressors, and whether or not depression/anxiety are contributing to your "laziness" (your word, not mine).  The higher dose of lipitor has helped lower your LDL and triglycerides (though still above goal), and chol/HDL ratio is now bettter. Continue the 69m. Your pre-diabetes has gotten worse (A1c is now 5.8 up from 5.4). SKirke Shaggywill help lower sugar.  Exercise bike, better food choices and portion control also need to be involved.

## 2018-06-07 ENCOUNTER — Encounter: Payer: Self-pay | Admitting: Family Medicine

## 2018-06-14 DIAGNOSIS — Z8371 Family history of colonic polyps: Secondary | ICD-10-CM | POA: Diagnosis not present

## 2018-06-14 DIAGNOSIS — Z1211 Encounter for screening for malignant neoplasm of colon: Secondary | ICD-10-CM | POA: Diagnosis not present

## 2018-06-14 LAB — HM COLONOSCOPY

## 2018-07-18 DIAGNOSIS — M47816 Spondylosis without myelopathy or radiculopathy, lumbar region: Secondary | ICD-10-CM | POA: Diagnosis not present

## 2018-07-18 DIAGNOSIS — M545 Low back pain: Secondary | ICD-10-CM | POA: Diagnosis not present

## 2018-07-22 DIAGNOSIS — M47816 Spondylosis without myelopathy or radiculopathy, lumbar region: Secondary | ICD-10-CM | POA: Diagnosis not present

## 2018-07-22 DIAGNOSIS — M2560 Stiffness of unspecified joint, not elsewhere classified: Secondary | ICD-10-CM | POA: Diagnosis not present

## 2018-07-22 DIAGNOSIS — M545 Low back pain: Secondary | ICD-10-CM | POA: Diagnosis not present

## 2018-07-25 DIAGNOSIS — M2560 Stiffness of unspecified joint, not elsewhere classified: Secondary | ICD-10-CM | POA: Diagnosis not present

## 2018-07-25 DIAGNOSIS — M47816 Spondylosis without myelopathy or radiculopathy, lumbar region: Secondary | ICD-10-CM | POA: Diagnosis not present

## 2018-07-25 DIAGNOSIS — M545 Low back pain: Secondary | ICD-10-CM | POA: Diagnosis not present

## 2018-07-29 DIAGNOSIS — M545 Low back pain: Secondary | ICD-10-CM | POA: Diagnosis not present

## 2018-07-29 DIAGNOSIS — M2560 Stiffness of unspecified joint, not elsewhere classified: Secondary | ICD-10-CM | POA: Diagnosis not present

## 2018-07-29 DIAGNOSIS — M47816 Spondylosis without myelopathy or radiculopathy, lumbar region: Secondary | ICD-10-CM | POA: Diagnosis not present

## 2018-08-01 DIAGNOSIS — M545 Low back pain: Secondary | ICD-10-CM | POA: Diagnosis not present

## 2018-08-01 DIAGNOSIS — M2560 Stiffness of unspecified joint, not elsewhere classified: Secondary | ICD-10-CM | POA: Diagnosis not present

## 2018-08-01 DIAGNOSIS — M47816 Spondylosis without myelopathy or radiculopathy, lumbar region: Secondary | ICD-10-CM | POA: Diagnosis not present

## 2018-08-04 ENCOUNTER — Encounter: Payer: Self-pay | Admitting: Family Medicine

## 2018-08-04 DIAGNOSIS — M47816 Spondylosis without myelopathy or radiculopathy, lumbar region: Secondary | ICD-10-CM | POA: Diagnosis not present

## 2018-08-04 DIAGNOSIS — M545 Low back pain: Secondary | ICD-10-CM | POA: Diagnosis not present

## 2018-08-04 DIAGNOSIS — M2560 Stiffness of unspecified joint, not elsewhere classified: Secondary | ICD-10-CM | POA: Diagnosis not present

## 2018-08-08 DIAGNOSIS — M47816 Spondylosis without myelopathy or radiculopathy, lumbar region: Secondary | ICD-10-CM | POA: Diagnosis not present

## 2018-08-08 DIAGNOSIS — M545 Low back pain: Secondary | ICD-10-CM | POA: Diagnosis not present

## 2018-08-08 DIAGNOSIS — M2560 Stiffness of unspecified joint, not elsewhere classified: Secondary | ICD-10-CM | POA: Diagnosis not present

## 2018-08-11 DIAGNOSIS — M47816 Spondylosis without myelopathy or radiculopathy, lumbar region: Secondary | ICD-10-CM | POA: Diagnosis not present

## 2018-08-11 DIAGNOSIS — M2560 Stiffness of unspecified joint, not elsewhere classified: Secondary | ICD-10-CM | POA: Diagnosis not present

## 2018-08-12 DIAGNOSIS — F419 Anxiety disorder, unspecified: Secondary | ICD-10-CM | POA: Diagnosis not present

## 2018-08-12 DIAGNOSIS — F3289 Other specified depressive episodes: Secondary | ICD-10-CM | POA: Diagnosis not present

## 2018-08-17 NOTE — Telephone Encounter (Signed)
   With my 2020 insurance, Arnuity is well over $100.00.Is it possible for me to go back on Advair Diskus, which I had been on a few years ago?The pricing for this is much better at $39.00.   Thanks for your time!   MR please advise

## 2018-08-18 DIAGNOSIS — M545 Low back pain: Secondary | ICD-10-CM | POA: Diagnosis not present

## 2018-08-18 DIAGNOSIS — M47816 Spondylosis without myelopathy or radiculopathy, lumbar region: Secondary | ICD-10-CM | POA: Diagnosis not present

## 2018-08-18 DIAGNOSIS — M2560 Stiffness of unspecified joint, not elsewhere classified: Secondary | ICD-10-CM | POA: Diagnosis not present

## 2018-08-18 NOTE — Telephone Encounter (Signed)
Ok to change to advair - 100/50

## 2018-08-19 DIAGNOSIS — F419 Anxiety disorder, unspecified: Secondary | ICD-10-CM | POA: Diagnosis not present

## 2018-08-19 DIAGNOSIS — F3289 Other specified depressive episodes: Secondary | ICD-10-CM | POA: Diagnosis not present

## 2018-08-26 DIAGNOSIS — F419 Anxiety disorder, unspecified: Secondary | ICD-10-CM | POA: Diagnosis not present

## 2018-08-26 DIAGNOSIS — F3289 Other specified depressive episodes: Secondary | ICD-10-CM | POA: Diagnosis not present

## 2018-08-29 DIAGNOSIS — M545 Low back pain: Secondary | ICD-10-CM | POA: Diagnosis not present

## 2018-08-29 DIAGNOSIS — M47816 Spondylosis without myelopathy or radiculopathy, lumbar region: Secondary | ICD-10-CM | POA: Diagnosis not present

## 2018-09-02 DIAGNOSIS — F419 Anxiety disorder, unspecified: Secondary | ICD-10-CM | POA: Diagnosis not present

## 2018-09-02 DIAGNOSIS — F3289 Other specified depressive episodes: Secondary | ICD-10-CM | POA: Diagnosis not present

## 2018-09-09 DIAGNOSIS — F3289 Other specified depressive episodes: Secondary | ICD-10-CM | POA: Diagnosis not present

## 2018-09-09 DIAGNOSIS — F419 Anxiety disorder, unspecified: Secondary | ICD-10-CM | POA: Diagnosis not present

## 2018-09-11 ENCOUNTER — Other Ambulatory Visit: Payer: Self-pay | Admitting: Family Medicine

## 2018-09-11 DIAGNOSIS — E8881 Metabolic syndrome: Secondary | ICD-10-CM

## 2018-09-11 DIAGNOSIS — F325 Major depressive disorder, single episode, in full remission: Secondary | ICD-10-CM

## 2018-09-12 ENCOUNTER — Telehealth: Payer: Self-pay | Admitting: *Deleted

## 2018-09-12 DIAGNOSIS — H0289 Other specified disorders of eyelid: Secondary | ICD-10-CM | POA: Diagnosis not present

## 2018-09-12 NOTE — Telephone Encounter (Signed)
Patient advised.

## 2018-09-12 NOTE — Telephone Encounter (Signed)
Patient coming in for med check Wed and would like to know if she needs labs prior. If so she would like to come in tomorrow to have these done.

## 2018-09-12 NOTE — Telephone Encounter (Signed)
No labs are needed (we do them every 6 months, and were done in December)

## 2018-09-13 NOTE — Progress Notes (Signed)
Chief Complaint  Patient presents with  . Hypertension    nonfasting med check. Patient has started therapy.     Hypertension: BP's remain well controlled on 12.52m of HCTZ and losartan. Denies side effects, headaches, dizziness, chest pain palpitations, edema.  Hyperlipidemia follow-up: Patient is reportedly following a low-fat, low cholesterol diet. Compliant with atorvastatin 874m tolerating without side effects. She also continues to take fish oil 3/d. TG was elevated on last check. Lab Results  Component Value Date   CHOL 177 06/03/2018   HDL 47 06/03/2018   LDLCALC 86 06/03/2018   TRIG 221 (H) 06/03/2018   CHOLHDL 3.8 06/03/2018    PCOS/insulin resistance/suspected DM: Compliant with metformin. Denies polydipsia, polyuria.  Lab Results  Component Value Date   HGBA1C 5.8 (H) 06/03/2018   Obesity, and fatty liver: At her last visit, we discussed the importance of doing SOMETHING. We discussed options including Saxenda, Healthy Weight and Weight Loss clinic through [facility] (Dr. BeRecardo Evangelistand others), Bariatric surgery consult and Weight Watchers. She checked and Saxenda was not covered by her insurance and isn't affordable. She joined [employer] hasn't been doing it. No motivation to do anything. She continues to have a lot of pain in her back.  She had 2nd medial branch block in her back, bilateral, without any pain relief. Next step is radiofrequency ablation. She is feeling very frustrated. Depression is worse.    Depression/anxiety: On Wellbutrin and citalopram. Denies side effects. Reports worsening depression, contributed by her pain, and not being able to exercise the way she wants to.  She [place], going weekly. She is asking if we got anything from her yet (no), in regards to medications.  Since her last visit, she is embarrassed to admit how much junk food she has  been buying and eating, admits it is out of control.  She admits that her pain contributes to her depression. Started on Sprix (prn acute pain) Never been on lyrica or gabapentin Previously discussed meds with reference to neuropathy in feet (which is only numbness, not pain), not in relation to back pain, so wasn't started She is under the care of Dr. IbRon Ageenext appt 3/30.  PMH, PSH, SH reviewed  Outpatient Encounter Medications as of 09/14/2018  Medication Sig Note  . ALPRAZolam (XANAX) 0.5 MG tablet TAKE 1/2 -1 TABLET BY MOUTH 3 TIMES DAILY AS NEEDED FOR ANXIETY 09/14/2018: 1/2 tablet 2x/week lately  . atorvastatin (LIPITOR) 80 MG tablet Take 1 tablet (80 mg total) by mouth daily.   . Calcium Carbonate-Vitamin D (CALCIUM + D) 600-200 MG-UNIT TABS Take 2 tablets by mouth daily.     . cetirizine (ZYRTEC) 10 MG tablet Take 10 mg by mouth daily.     . citalopram (CELEXA) 40 MG tablet Take 1 tablet (40 mg total) by mouth daily.   . diclofenac (VOLTAREN) 75 MG EC tablet Take 1 tablet (75 mg total) by mouth 2 (two) times daily.   . Ferrous Sulfate (IRON) 325 (65 Fe) MG TABS Take 1 tablet by mouth daily.   . fish oil-omega-3 fatty acids 1000 MG capsule Take 3 g by mouth daily.    . flintstones complete (FLINTSTONES) 60 MG chewable tablet Chew 1 tablet by mouth daily.   . fluticasone (FLONASE) 50 MCG/ACT nasal spray INSTILL 1 TO 2 SPRAYS INTO EACH NOSTRIL ONCE A DAY   . Fluticasone Furoate (ARNUITY ELLIPTA) 100 MCG/ACT AEPB Inhale  1 puff into the lungs daily.   . hydrochlorothiazide (MICROZIDE) 12.5 MG capsule TAKE 1 CAPSULE(12.5 MG) BY MOUTH DAILY   . losartan (COZAAR) 25 MG tablet Take 1 tablet (25 mg total) by mouth daily.   Marland Kitchen omeprazole (PRILOSEC) 20 MG capsule Take 20 mg by mouth daily.   03/09/2017: Takes about qod  . vitamin B-12 (CYANOCOBALAMIN) 1000 MCG tablet Take 1,000 mcg by mouth daily.     . vitamin C (ASCORBIC ACID) 500 MG tablet Take 500 mg by mouth daily.   . [DISCONTINUED]  buPROPion (WELLBUTRIN XL) 300 MG 24 hr tablet Take 1 tablet (300 mg total) by mouth daily.   . [DISCONTINUED] metFORMIN (GLUCOPHAGE-XR) 500 MG 24 hr tablet TAKE 2 TABLETS BY MOUTH EVERY MORNING AND 1 TABLET EVERY EVENING   . [DISCONTINUED] nystatin (MYCOSTATIN/NYSTOP) powder Apply 1 g topically 2 (two) times daily.    Marland Kitchen gabapentin (NEURONTIN) 100 MG capsule Take as directed, starting with 2-3 at bedtime, and gradually adding in daytime dose, max of 354m three times daily    No facility-administered encounter medications on file as of 09/14/2018.    Allergies  Allergen Reactions  . Meloxicam Itching   ROS:  No fever, chills, URI symptoms, shortness of breath, chest pain, GI complaints.  +back pain with bilateral sciatica. No urinary issues.  +depression +weight gain, 19# since last visit   PHYSICAL EXAM:  BP 138/76   Pulse 80   Ht 5' 4"  (1.626 m)   Wt 268 lb 9.6 oz (121.8 kg)   LMP 02/09/2009 (Exact Date)   BMI 46.11 kg/m   Wt Readings from Last 3 Encounters:  09/14/18 268 lb 9.6 oz (121.8 kg)  06/06/18 249 lb 9.6 oz (113.2 kg)  04/20/18 246 lb 3.2 oz (111.7 kg)   Obese, pleasant female, who appears mildly depressed, but has full range of affect. She has normal eye contact, speech, hygiene and grooming She is alert, oriented, cranial nerves intact, and has normal gait. Remainder of visit was limited to counseling.  ASSESSMENT/PLAN:  Chronic bilateral low back pain with bilateral sciatica - f/u with Dr. IRon Ageeas planned. Trial of gabapentin. Risks/SE reviewed.  Start 2-300 qHS, and titrate up, adding 1022mqAM - Plan: gabapentin (NEURONTIN) 100 MG capsule  Depression, recurrent ([facility]) - no longer in remission. Cont counseling, consider psych referral for med management  Class 3 severe obesity due to excess calories with serious comorbidity and body mass index (BMI) of 45.0 to 49.9 in adult [facility] counseled [facility] re: diet, small changes, accountability. Consider [facility]  clinic  Essential hypertension - continue current meds; exercise as tolerated; wt loss, low sodium diet.   Mixed hyperlipidemia - TG elevated on last check--suspect higher now due to poor diet. Cont current meds. diet discussed. recheck 3 mos  30 min visit, more than 1/2 spent counseling (re: depression, obesity, risks, weight loss) Cont counseling, likely will need to see psych for med changes--will check with therapist for who she recommends.    3 month med check, with fasting labs prior--A1c, c-met, lipid, TSH, CBC    If you are seeing the medical weight loss clinic prior to your next visit with me, please call usKorearior to coming for your labs prior to your visit. If they did labs, I don't want to repeat any of them unnecessarily.

## 2018-09-14 ENCOUNTER — Other Ambulatory Visit: Payer: Self-pay | Admitting: *Deleted

## 2018-09-14 ENCOUNTER — Encounter: Payer: Self-pay | Admitting: Family Medicine

## 2018-09-14 ENCOUNTER — Other Ambulatory Visit: Payer: Self-pay

## 2018-09-14 ENCOUNTER — Ambulatory Visit (INDEPENDENT_AMBULATORY_CARE_PROVIDER_SITE_OTHER): Payer: BLUE CROSS/BLUE SHIELD | Admitting: Family Medicine

## 2018-09-14 VITALS — BP 138/76 | HR 80 | Ht 64.0 in | Wt 268.6 lb

## 2018-09-14 DIAGNOSIS — I10 Essential (primary) hypertension: Secondary | ICD-10-CM | POA: Diagnosis not present

## 2018-09-14 DIAGNOSIS — G8929 Other chronic pain: Secondary | ICD-10-CM

## 2018-09-14 DIAGNOSIS — Z6841 Body Mass Index (BMI) 40.0 and over, adult: Secondary | ICD-10-CM

## 2018-09-14 DIAGNOSIS — M5442 Lumbago with sciatica, left side: Secondary | ICD-10-CM | POA: Diagnosis not present

## 2018-09-14 DIAGNOSIS — E782 Mixed hyperlipidemia: Secondary | ICD-10-CM

## 2018-09-14 DIAGNOSIS — E8881 Metabolic syndrome: Secondary | ICD-10-CM

## 2018-09-14 DIAGNOSIS — R635 Abnormal weight gain: Secondary | ICD-10-CM

## 2018-09-14 DIAGNOSIS — F339 Major depressive disorder, recurrent, unspecified: Secondary | ICD-10-CM

## 2018-09-14 DIAGNOSIS — M5441 Lumbago with sciatica, right side: Secondary | ICD-10-CM

## 2018-09-14 DIAGNOSIS — Z5181 Encounter for therapeutic drug level monitoring: Secondary | ICD-10-CM

## 2018-09-14 DIAGNOSIS — F325 Major depressive disorder, single episode, in full remission: Secondary | ICD-10-CM

## 2018-09-14 MED ORDER — BUPROPION HCL ER (XL) 300 MG PO TB24: 300.0000 mg | ORAL_TABLET | Freq: Every day | ORAL | 0 refills | Status: DC

## 2018-09-14 MED ORDER — METFORMIN HCL ER 500 MG PO TB24: ORAL_TABLET | ORAL | 0 refills | Status: DC

## 2018-09-14 MED ORDER — GABAPENTIN 100 MG PO CAPS: ORAL_CAPSULE | ORAL | 0 refills | Status: DC

## 2018-09-14 NOTE — Patient Instructions (Addendum)
Set small goals for yourself.  Try and change one thing each day, not be paralyzed by the enormity of change that needs to eventually come. If you have a bad day, just try harder the next day (don't punish yourself or feel bad about yourself). Continue with counseling. Talk to your therapist about a recommendation for a psychiatrist, if she feels that medication changes are indicated.  We discussed Healthy Weight and Weight loss clinic ([facility]--Dr. Leafy Ro, Dr. Adair Patter).  If you are seeing the medical weight loss clinic prior to your next visit with me, please call us prior to coming for your labs prior to your visit. If they did labs, I don't want to repeat any of them unnecessarily.   Let's hope that social distancing during these crazy COVID times, will help limit the frequency of trips to the store for junk food. Stay home, eat healthy.   Start taking gabapentin at bedtime.  Start with 2 capsules at bedtime. If you are tolerating this without excessive sleepiness, increase to 3 capsules at bedtime. After a week, if still tolerating, but not having adequate pain relief, start taking 169m capsule in the morning.  Stay at that dose (1 in the morning, 3 at bedtime) until you see Dr. IRon Ageethe end of the month, to further discuss and potentially get further refills.  Coronavirus (COVID-19) Are you at risk?  Are you at risk for the Coronavirus (COVID-19)?  To be considered HIGH RISK for Coronavirus (COVID-19), you have to meet the following criteria:  . Traveled to [place] or in the [place] or [place] and have fever, cough, and shortness of breath within the last 2 weeks of travel OR . Been in close contact with a person diagnosed with COVID-19 within the last 2 weeks and have fever, cough, and shortness of breath . IF YOU DO NOT MEET THESE CRITERIA, YOU ARE CONSIDERED LOW RISK FOR  COVID-19.  What to do if you are HIGH RISK for COVID-19?  .Marland KitchenIf you are having a medical emergency, call 911. . Seek medical care right away. Before you go to a doctor's office, urgent care or emergency department, call ahead and tell them about your recent travel, contact with someone diagnosed with COVID-19, and your symptoms. You should receive instructions from your physician's office regarding next steps of care.  . When you arrive at healthcare provider, tell the healthcare staff immediately you have returned from visiting [place] or traveled in the [place] in the last two weeks or you have been in close contact with a person diagnosed with COVID-19 in the last 2 weeks.   . Tell the health care staff about your symptoms: fever, cough and shortness of breath. . After you have been seen by a medical provider, you will be either: o Tested for (COVID-19) and discharged home on quarantine except to seek medical care if symptoms worsen, and asked to  - Stay home and avoid contact with others until you get your results (4-5 days)  - Avoid travel on public transportation if possible (such as bus, train, or airplane) or o Sent to the Emergency Department by EMS for evaluation, COVID-19 testing, and possible admission depending on your condition and test results.  What to do if you are LOW RISK for COVID-19?  Reduce your risk of any  infection by using the same precautions used for avoiding the common cold or flu:  Marland Kitchen Wash your hands often with soap and warm water for at least 20 seconds.  If soap and water are not readily available, use an alcohol-based hand sanitizer with at least 60% alcohol.  . If coughing or sneezing, cover your mouth and nose by coughing or sneezing into the elbow areas of your shirt or coat, into a tissue or into your sleeve (not your hands). . Avoid shaking hands with others and consider  head nods or verbal greetings only. . Avoid touching your eyes, nose, or mouth with unwashed hands.  . Avoid close contact with people who are sick. . Avoid places or events with large numbers of people in one location, like concerts or sporting events. . Carefully consider travel plans you have or are making. . If you are planning any travel outside or inside the [place], visit the CDC's Travelers' Health webpage for the latest health notices. . If you have some symptoms but not all symptoms, continue to monitor at home and seek medical attention if your symptoms worsen. . If you are having a medical emergency, call 911.   [place] / e-Visit: [email or web address]         [facility]: [facility]: [phone]                   [facility]: [phone]

## 2018-09-16 DIAGNOSIS — M2560 Stiffness of unspecified joint, not elsewhere classified: Secondary | ICD-10-CM | POA: Diagnosis not present

## 2018-09-16 DIAGNOSIS — F419 Anxiety disorder, unspecified: Secondary | ICD-10-CM | POA: Diagnosis not present

## 2018-09-16 DIAGNOSIS — F3289 Other specified depressive episodes: Secondary | ICD-10-CM | POA: Diagnosis not present

## 2018-09-16 DIAGNOSIS — M47816 Spondylosis without myelopathy or radiculopathy, lumbar region: Secondary | ICD-10-CM | POA: Diagnosis not present

## 2018-09-23 DIAGNOSIS — F419 Anxiety disorder, unspecified: Secondary | ICD-10-CM | POA: Diagnosis not present

## 2018-09-23 DIAGNOSIS — F3289 Other specified depressive episodes: Secondary | ICD-10-CM | POA: Diagnosis not present

## 2018-09-26 DIAGNOSIS — M47816 Spondylosis without myelopathy or radiculopathy, lumbar region: Secondary | ICD-10-CM | POA: Diagnosis not present

## 2018-09-26 DIAGNOSIS — M5416 Radiculopathy, lumbar region: Secondary | ICD-10-CM | POA: Diagnosis not present

## 2018-09-26 DIAGNOSIS — M545 Low back pain: Secondary | ICD-10-CM | POA: Diagnosis not present

## 2018-09-30 DIAGNOSIS — F419 Anxiety disorder, unspecified: Secondary | ICD-10-CM | POA: Diagnosis not present

## 2018-09-30 DIAGNOSIS — F3289 Other specified depressive episodes: Secondary | ICD-10-CM | POA: Diagnosis not present

## 2018-10-03 ENCOUNTER — Encounter: Payer: Self-pay | Admitting: Family Medicine

## 2018-10-03 ENCOUNTER — Other Ambulatory Visit: Payer: Self-pay | Admitting: *Deleted

## 2018-10-03 ENCOUNTER — Other Ambulatory Visit: Payer: Self-pay | Admitting: Family Medicine

## 2018-10-03 DIAGNOSIS — G8929 Other chronic pain: Secondary | ICD-10-CM

## 2018-10-03 DIAGNOSIS — M5441 Lumbago with sciatica, right side: Principal | ICD-10-CM

## 2018-10-03 DIAGNOSIS — M5442 Lumbago with sciatica, left side: Principal | ICD-10-CM

## 2018-10-03 DIAGNOSIS — I10 Essential (primary) hypertension: Secondary | ICD-10-CM

## 2018-10-03 MED ORDER — GABAPENTIN 100 MG PO CAPS: ORAL_CAPSULE | ORAL | 0 refills | Status: DC

## 2018-10-07 DIAGNOSIS — F419 Anxiety disorder, unspecified: Secondary | ICD-10-CM | POA: Diagnosis not present

## 2018-10-07 DIAGNOSIS — F3289 Other specified depressive episodes: Secondary | ICD-10-CM | POA: Diagnosis not present

## 2018-10-14 DIAGNOSIS — F419 Anxiety disorder, unspecified: Secondary | ICD-10-CM | POA: Diagnosis not present

## 2018-10-14 DIAGNOSIS — F3289 Other specified depressive episodes: Secondary | ICD-10-CM | POA: Diagnosis not present

## 2018-10-17 ENCOUNTER — Other Ambulatory Visit: Payer: Self-pay | Admitting: Family Medicine

## 2018-10-17 DIAGNOSIS — E8881 Metabolic syndrome: Secondary | ICD-10-CM

## 2018-10-21 DIAGNOSIS — F419 Anxiety disorder, unspecified: Secondary | ICD-10-CM | POA: Diagnosis not present

## 2018-10-21 DIAGNOSIS — F3289 Other specified depressive episodes: Secondary | ICD-10-CM | POA: Diagnosis not present

## 2018-10-27 DIAGNOSIS — M47816 Spondylosis without myelopathy or radiculopathy, lumbar region: Secondary | ICD-10-CM | POA: Diagnosis not present

## 2018-10-27 DIAGNOSIS — M545 Low back pain: Secondary | ICD-10-CM | POA: Diagnosis not present

## 2018-10-28 DIAGNOSIS — F3289 Other specified depressive episodes: Secondary | ICD-10-CM | POA: Diagnosis not present

## 2018-10-28 DIAGNOSIS — F419 Anxiety disorder, unspecified: Secondary | ICD-10-CM | POA: Diagnosis not present

## 2018-11-01 ENCOUNTER — Other Ambulatory Visit: Payer: Self-pay | Admitting: Family Medicine

## 2018-11-01 DIAGNOSIS — G8929 Other chronic pain: Secondary | ICD-10-CM

## 2018-11-01 DIAGNOSIS — M545 Low back pain: Principal | ICD-10-CM

## 2018-11-01 NOTE — Telephone Encounter (Signed)
Is this ok to refill?  

## 2018-11-04 DIAGNOSIS — F419 Anxiety disorder, unspecified: Secondary | ICD-10-CM | POA: Diagnosis not present

## 2018-11-04 DIAGNOSIS — F3289 Other specified depressive episodes: Secondary | ICD-10-CM | POA: Diagnosis not present

## 2018-11-11 DIAGNOSIS — F419 Anxiety disorder, unspecified: Secondary | ICD-10-CM | POA: Diagnosis not present

## 2018-11-11 DIAGNOSIS — F3289 Other specified depressive episodes: Secondary | ICD-10-CM | POA: Diagnosis not present

## 2018-11-12 ENCOUNTER — Other Ambulatory Visit: Payer: Self-pay | Admitting: Family Medicine

## 2018-11-12 DIAGNOSIS — E8881 Metabolic syndrome: Secondary | ICD-10-CM

## 2018-11-17 DIAGNOSIS — M545 Low back pain: Secondary | ICD-10-CM | POA: Diagnosis not present

## 2018-11-17 DIAGNOSIS — M47816 Spondylosis without myelopathy or radiculopathy, lumbar region: Secondary | ICD-10-CM | POA: Diagnosis not present

## 2018-11-18 DIAGNOSIS — F3289 Other specified depressive episodes: Secondary | ICD-10-CM | POA: Diagnosis not present

## 2018-11-18 DIAGNOSIS — F419 Anxiety disorder, unspecified: Secondary | ICD-10-CM | POA: Diagnosis not present

## 2018-11-25 DIAGNOSIS — F419 Anxiety disorder, unspecified: Secondary | ICD-10-CM | POA: Diagnosis not present

## 2018-11-25 DIAGNOSIS — F3289 Other specified depressive episodes: Secondary | ICD-10-CM | POA: Diagnosis not present

## 2018-12-02 DIAGNOSIS — F3289 Other specified depressive episodes: Secondary | ICD-10-CM | POA: Diagnosis not present

## 2018-12-02 DIAGNOSIS — F419 Anxiety disorder, unspecified: Secondary | ICD-10-CM | POA: Diagnosis not present

## 2018-12-06 ENCOUNTER — Other Ambulatory Visit: Payer: Self-pay

## 2018-12-06 ENCOUNTER — Other Ambulatory Visit: Payer: Self-pay | Admitting: Family Medicine

## 2018-12-06 ENCOUNTER — Other Ambulatory Visit: Payer: BC Managed Care – PPO

## 2018-12-06 DIAGNOSIS — E8881 Metabolic syndrome: Secondary | ICD-10-CM

## 2018-12-06 DIAGNOSIS — F325 Major depressive disorder, single episode, in full remission: Secondary | ICD-10-CM

## 2018-12-06 DIAGNOSIS — Z5181 Encounter for therapeutic drug level monitoring: Secondary | ICD-10-CM | POA: Diagnosis not present

## 2018-12-06 DIAGNOSIS — F339 Major depressive disorder, recurrent, unspecified: Secondary | ICD-10-CM

## 2018-12-06 DIAGNOSIS — R635 Abnormal weight gain: Secondary | ICD-10-CM | POA: Diagnosis not present

## 2018-12-06 DIAGNOSIS — E782 Mixed hyperlipidemia: Secondary | ICD-10-CM | POA: Diagnosis not present

## 2018-12-06 DIAGNOSIS — F411 Generalized anxiety disorder: Secondary | ICD-10-CM

## 2018-12-06 DIAGNOSIS — I10 Essential (primary) hypertension: Secondary | ICD-10-CM

## 2018-12-06 LAB — LIPID PANEL

## 2018-12-07 LAB — CBC WITH DIFFERENTIAL/PLATELET
Basophils Absolute: 0 10*3/uL (ref 0.0–0.2)
Basos: 1 %
EOS (ABSOLUTE): 0.2 10*3/uL (ref 0.0–0.4)
Eos: 3 %
Hematocrit: 36.1 % (ref 34.0–46.6)
Hemoglobin: 11.8 g/dL (ref 11.1–15.9)
Immature Grans (Abs): 0 10*3/uL (ref 0.0–0.1)
Immature Granulocytes: 0 %
Lymphocytes Absolute: 2.3 10*3/uL (ref 0.7–3.1)
Lymphs: 36 %
MCH: 30.4 pg (ref 26.6–33.0)
MCHC: 32.7 g/dL (ref 31.5–35.7)
MCV: 93 fL (ref 79–97)
Monocytes Absolute: 0.5 10*3/uL (ref 0.1–0.9)
Monocytes: 7 %
Neutrophils Absolute: 3.3 10*3/uL (ref 1.4–7.0)
Neutrophils: 53 %
Platelets: 253 10*3/uL (ref 150–450)
RBC: 3.88 x10E6/uL (ref 3.77–5.28)
RDW: 13.8 % (ref 11.7–15.4)
WBC: 6.3 10*3/uL (ref 3.4–10.8)

## 2018-12-07 LAB — COMPREHENSIVE METABOLIC PANEL
ALT: 97 IU/L — ABNORMAL HIGH (ref 0–32)
AST: 63 IU/L — ABNORMAL HIGH (ref 0–40)
Albumin/Globulin Ratio: 1.8 (ref 1.2–2.2)
Albumin: 4.5 g/dL (ref 3.8–4.9)
Alkaline Phosphatase: 92 IU/L (ref 39–117)
BUN/Creatinine Ratio: 12 (ref 9–23)
BUN: 13 mg/dL (ref 6–24)
Bilirubin Total: 0.3 mg/dL (ref 0.0–1.2)
CO2: 24 mmol/L (ref 20–29)
Calcium: 9.8 mg/dL (ref 8.7–10.2)
Chloride: 102 mmol/L (ref 96–106)
Creatinine, Ser: 1.08 mg/dL — ABNORMAL HIGH (ref 0.57–1.00)
GFR calc Af Amer: 67 mL/min/{1.73_m2} (ref >59)
GFR calc non Af Amer: 58 mL/min/{1.73_m2} — ABNORMAL LOW (ref >59)
Globulin, Total: 2.5 g/dL (ref 1.5–4.5)
Glucose: 94 mg/dL (ref 65–99)
Potassium: 4.1 mmol/L (ref 3.5–5.2)
Sodium: 142 mmol/L (ref 134–144)
Total Protein: 7 g/dL (ref 6.0–8.5)

## 2018-12-07 LAB — HEMOGLOBIN A1C
Est. average glucose Bld gHb Est-mCnc: 120 mg/dL
Hgb A1c MFr Bld: 5.8 % — ABNORMAL HIGH (ref 4.8–5.6)

## 2018-12-07 LAB — TSH: TSH: 2.17 u[IU]/mL (ref 0.450–4.500)

## 2018-12-07 LAB — LIPID PANEL
Chol/HDL Ratio: 3.4 ratio (ref 0.0–4.4)
Cholesterol, Total: 161 mg/dL (ref 100–199)
HDL: 47 mg/dL (ref >39)
LDL Calculated: 68 mg/dL (ref 0–99)
Triglycerides: 231 mg/dL — ABNORMAL HIGH (ref 0–149)
VLDL Cholesterol Cal: 46 mg/dL — ABNORMAL HIGH (ref 5–40)

## 2018-12-13 NOTE — Progress Notes (Signed)
Chief Complaint  Patient presents with  . Diabetes    nonfasting med check, labs done. Started intermittent fasting. During the week when she was more strict she would notice diarrhea and nausea. On weekends when she is less strict she doesn't notice it as much.     Patient presents for 3 month f/u on chronic problems.  Hypertension: She is taking 12.17m of HCTZ and losartan. Doesn't check BP elsewhere.  Denies side effects, headaches, dizziness, chest pain palpitations, edema.  Denies being in pain currently, but admits to being anxious about discussing her blood work. BP Readings from Last 3 Encounters:  12/15/18 138/80  09/14/18 138/76  06/06/18 122/74   Hyperlipidemia follow-up: Patient is reportedly following a low cholesterol diet. Compliant with atorvastatin 838m tolerating without side effects. She also continues to taUSAA/d. TG was elevated on last check. She had labs done prior to her visit, see below. Admits to poor diet (fast foods, frozen pizza and sweets).  PCOS/insulin resistance/suspected DM: Compliant with metformin, denies side effects. Just recently noting some diarrhea per above.  Denies polydipsia, polyuria.  Obesity, and fatty liver: She had gained 19# prior to her last visit.  We discussed obesity, risks, diet, moods in great detail. She had been buying/eating a lot of junk food. We had previously discussed medications, Saxenda wasn't covered by her insurance.  She joined [employer] hadn't gone.  She had been more depressed, and was having issues with chronic back pain flaring, contributing to her depression.  She was asked to consder [facility] clinic.  Started intermittent fasting (eating 12-6), about 3 weeks ago. Diarrhea is noticed during the week, not as much on the weekends when window is longer.  First meal is earlier on the weekend, doesn't notice the nausea. Has fruit and vegetable smoothies during the week, not on the weekends. Contains kale, coconut water or  almondmilk, berries/fruit. Having a little more reflux recently.  Depression/anxiety: On Wellbutrin and citalopram. Denies side effects. Reported worsening depression at her last visit, contributed by her pain, and not being able to exercise the way she wants to.She had been seeing Dr. OcBrent Bullaat CaInaWe had discussed possible need for psychiatrist, and to get recommendation from her therapist. Therapist plan is to try and get her off meds, per patient, didn't pursue psychiatrist.  Chronic back pain, with bilateral sciatica.  She is under the care of Dr. IbRon AgeeShe had 2nd medial branch block in her back, bilateral, without any pain relief. We had started her on Gabapentin as a trial, to f/u with Dr. IbRon Ageeor further titration of this med (started at 200-300 qHS and titrating up).  She had radiofrequency ablation bilaterally in April, at 3 levels (per pt, no record received).  She has noted benefit.  Still has muscular pain, but the nerve pain is reduced.  Still has some sciatica (reportedly "a different issue").  Pain is better, able to walk a little more.  She is taking 30042mID of gabapentin.  She denies sedation.  Numbness in feet is a little better, hasn't noticed much benefit on her back.  Didn't discuss titrating the dose up further with Dr. IbaRon Ageesked for us [place] continue to rx, since we started, he won't refill. She sees him next week. She is concerned about polypharmacy and the risks not outweighing the benefit (which is small).  Requesting alrazolam refill--last filled #20 in September  Taking diclofenac just once daily in evening, taking tylenol in the morning. (  to cut back on NSAIDs to protect her kidneys).  Working okay like this for pain control.   PMH, PSH, SH reviewed  Outpatient Encounter Medications as of 12/15/2018  Medication Sig Note  . ALPRAZolam (XANAX) 0.5 MG tablet TAKE 1/2 -1 TABLET BY MOUTH 3 TIMES DAILY AS NEEDED FOR ANXIETY    . atorvastatin (LIPITOR) 80 MG tablet Take 1 tablet (80 mg total) by mouth daily.   Marland Kitchen buPROPion (WELLBUTRIN XL) 300 MG 24 hr tablet Take 1 tablet (300 mg total) by mouth daily.   . Calcium Carbonate-Vitamin D (CALCIUM + D) 600-200 MG-UNIT TABS Take 2 tablets by mouth daily.     . citalopram (CELEXA) 40 MG tablet TAKE 1 TABLET(40 MG) BY MOUTH DAILY   . diclofenac (VOLTAREN) 75 MG EC tablet TAKE 1 TABLET(75 MG) BY MOUTH TWICE DAILY   . Ferrous Sulfate (IRON) 325 (65 Fe) MG TABS Take 1 tablet by mouth daily.   . flintstones complete (FLINTSTONES) 60 MG chewable tablet Chew 1 tablet by mouth daily.   . fluticasone (FLONASE) 50 MCG/ACT nasal spray INSTILL 1 TO 2 SPRAYS INTO EACH NOSTRIL ONCE A DAY   . Fluticasone Furoate (ARNUITY ELLIPTA) 100 MCG/ACT AEPB Inhale 1 puff into the lungs daily.   Marland Kitchen gabapentin (NEURONTIN) 300 MG capsule TK ONE C PO BID   . hydrochlorothiazide (MICROZIDE) 12.5 MG capsule TAKE 1 CAPSULE(12.5 MG) BY MOUTH DAILY   . loratadine (CLARITIN) 10 MG tablet Take 10 mg by mouth daily.   Marland Kitchen losartan (COZAAR) 25 MG tablet TAKE 1 TABLET(25 MG) BY MOUTH DAILY   . metFORMIN (GLUCOPHAGE-XR) 500 MG 24 hr tablet TAKE 2 TABLETS BY MOUTH EVERY MORNING AND 1 TABLET EVERY EVENING   . Omega-3 Fatty Acids (FISH OIL) 1200 MG CAPS Take 3 capsules by mouth daily.   Marland Kitchen omeprazole (PRILOSEC) 20 MG capsule Take 20 mg by mouth daily.   03/09/2017: Takes about qod  . vitamin B-12 (CYANOCOBALAMIN) 1000 MCG tablet Take 1,000 mcg by mouth daily.     . vitamin C (ASCORBIC ACID) 500 MG tablet Take 500 mg by mouth daily.   . [DISCONTINUED] ALPRAZolam (XANAX) 0.5 MG tablet TAKE 1/2 -1 TABLET BY MOUTH 3 TIMES DAILY AS NEEDED FOR ANXIETY 09/14/2018: 1/2 tablet 2x/week lately  . [DISCONTINUED] atorvastatin (LIPITOR) 80 MG tablet Take 1 tablet (80 mg total) by mouth daily.   . [DISCONTINUED] buPROPion (WELLBUTRIN XL) 300 MG 24 hr tablet Take 1 tablet (300 mg total) by mouth daily.   . [DISCONTINUED] citalopram  (CELEXA) 40 MG tablet TAKE 1 TABLET(40 MG) BY MOUTH DAILY   . [DISCONTINUED] gabapentin (NEURONTIN) 100 MG capsule Take as directed, starting with 2-3 at bedtime, and gradually adding in daytime dose, max of 333m three times daily   . [DISCONTINUED] metFORMIN (GLUCOPHAGE-XR) 500 MG 24 hr tablet TAKE 2 TABLETS BY MOUTH EVERY MORNING AND 1 TABLET EVERY EVENING   . [DISCONTINUED] cetirizine (ZYRTEC) 10 MG tablet Take 10 mg by mouth daily.     . [DISCONTINUED] diazepam (VALIUM) 5 MG tablet TK 1 T PO 1 HOUR PRIOR TO PROCEDURE AND SECOND T PRN WHEN YOU ARRIVE   . [DISCONTINUED] fish oil-omega-3 fatty acids 1000 MG capsule Take 3 g by mouth daily.     No facility-administered encounter medications on file as of 12/15/2018.    Allergies  Allergen Reactions  . Meloxicam Itching    ROS: no fever, chills, URI symptoms, headaches, dizziness, chest pain, palpitations, shortness of breath, edema, bleeding, bruising, rash.  Denies urinary complaints. Nausea and diarrhea per HPI, some worsening reflux.  Back pain and depression per HPI. Some improvement s/p RFA.    PHYSICAL EXAM:  BP 138/80   Pulse 76   Ht 5' 4"  (1.626 m)   Wt 267 lb 6.4 oz (121.3 kg)   LMP 02/09/2009 (Exact Date)   BMI 45.90 kg/m   Hasn't taken BP med yet today  Wt Readings from Last 3 Encounters:  12/15/18 267 lb 6.4 oz (121.3 kg)  12/06/18 267 lb 12.8 oz (121.5 kg)  09/14/18 268 lb 9.6 oz (121.8 kg)   Well-appearing, pleasant, obese female in no distress HEENT: conjunctiva and sclera are clear, EOMI. Nose and OP exam not performed, wearing mask due to COVID-19 Neck: no lymphadenopathy, thyromegaly mass, no carotid bruit Heart: regular rate and rhythm no murmur Lungs: clear bilaterally, no wheezes, rales, ronchi, good air movement Abdomen: soft, no organomegaly or mass.  Nontender, no rebound or guarding Extremities:no edema, normal pulses Skin: normal turgor, no rash Psych: normal affect, hygiene, grooming, eye  contact and speech. Neuro: alert and oriented. Normal gait. Back: no spinal tenderness, no SI tenderness, or CVA tenderness  PHQ-9 score of 6  Lab Results  Component Value Date   CHOL 161 12/06/2018   HDL 47 12/06/2018   LDLCALC 68 12/06/2018   TRIG 231 (H) 12/06/2018   CHOLHDL 3.4 12/06/2018   Lab Results  Component Value Date   HGBA1C 5.8 (H) 12/06/2018   Fasting glucose 94    Chemistry      Component Value Date/Time   NA 142 12/06/2018 0834   K 4.1 12/06/2018 0834   CL 102 12/06/2018 0834   CO2 24 12/06/2018 0834   BUN 13 12/06/2018 0834   CREATININE 1.08 (H) 12/06/2018 0834   CREATININE 0.97 03/09/2017 1110      Component Value Date/Time   CALCIUM 9.8 12/06/2018 0834   ALKPHOS 92 12/06/2018 0834   AST 63 (H) 12/06/2018 0834   ALT 97 (H) 12/06/2018 0834   BILITOT 0.3 12/06/2018 0834     Lab Results  Component Value Date   TSH 2.170 12/06/2018   Lab Results  Component Value Date   WBC 6.3 12/06/2018   HGB 11.8 12/06/2018   HCT 36.1 12/06/2018   MCV 93 12/06/2018   PLT 253 12/06/2018    ASSESSMENT/PLAN:  Essential hypertension - Elevated today; periodically monitor. Low Na diet, exercise, weight loss   Insulin resistance syndrome - continue metformin.  Weight loss encouraged - Plan: metFORMIN (GLUCOPHAGE-XR) 500 MG 24 hr tablet  Class 3 severe obesity due to excess calories with serious comorbidity and body mass index (BMI) of 45.0 to 49.9 in adult [facility]) - recently started intermittent fasting. Rec [facility] clinic. Significant emotional component to eating, psych may help too   Depression, recurrent ([place]) - encouraged continued counseling, consider psych consult for further med mgmt (encouraged)   Hepatic steatosis - LFT's higher (since weight gain). Will forwad results to her GI, rec for her to discuss, likely due for repeat imaging.  Wt loss and improved diet rec.   Mixed hyperlipidemia - LDL at goal; TG remain elevated, diet an issue. Increase fish oil  to 4/d - Plan: atorvastatin (LIPITOR) 80 MG tablet  Depression, major, in remission ([place]) - Plan: buPROPion (WELLBUTRIN XL) 300 MG 24 hr tablet, citalopram (CELEXA) 40 MG tablet  Anxiety state - Plan: citalopram (CELEXA) 40 MG tablet, ALPRAZolam (XANAX) 0.5 MG tablet  Anxiety state - continue stress reduction techniques.  rare use of alprazolam prn - Plan: citalopram (CELEXA) 40 MG tablet, ALPRAZolam (XANAX) 0.5 MG tablet  F/u 3 months

## 2018-12-15 ENCOUNTER — Encounter: Payer: Self-pay | Admitting: Family Medicine

## 2018-12-15 ENCOUNTER — Ambulatory Visit: Payer: BC Managed Care – PPO | Admitting: Family Medicine

## 2018-12-15 ENCOUNTER — Other Ambulatory Visit: Payer: Self-pay

## 2018-12-15 VITALS — BP 138/80 | HR 76 | Ht 64.0 in | Wt 267.4 lb

## 2018-12-15 DIAGNOSIS — I10 Essential (primary) hypertension: Secondary | ICD-10-CM

## 2018-12-15 DIAGNOSIS — F339 Major depressive disorder, recurrent, unspecified: Secondary | ICD-10-CM

## 2018-12-15 DIAGNOSIS — F411 Generalized anxiety disorder: Secondary | ICD-10-CM

## 2018-12-15 DIAGNOSIS — K760 Fatty (change of) liver, not elsewhere classified: Secondary | ICD-10-CM

## 2018-12-15 DIAGNOSIS — E8881 Metabolic syndrome: Secondary | ICD-10-CM | POA: Diagnosis not present

## 2018-12-15 DIAGNOSIS — E782 Mixed hyperlipidemia: Secondary | ICD-10-CM

## 2018-12-15 DIAGNOSIS — Z6841 Body Mass Index (BMI) 40.0 and over, adult: Secondary | ICD-10-CM

## 2018-12-15 DIAGNOSIS — F325 Major depressive disorder, single episode, in full remission: Secondary | ICD-10-CM

## 2018-12-15 MED ORDER — CITALOPRAM HYDROBROMIDE 40 MG PO TABS: ORAL_TABLET | ORAL | 1 refills | Status: DC

## 2018-12-15 MED ORDER — ATORVASTATIN CALCIUM 80 MG PO TABS: 80.0000 mg | ORAL_TABLET | Freq: Every day | ORAL | 1 refills | Status: DC

## 2018-12-15 MED ORDER — METFORMIN HCL ER 500 MG PO TB24: ORAL_TABLET | ORAL | 5 refills | Status: DC

## 2018-12-15 MED ORDER — BUPROPION HCL ER (XL) 300 MG PO TB24: 300.0000 mg | ORAL_TABLET | Freq: Every day | ORAL | 1 refills | Status: DC

## 2018-12-15 MED ORDER — ALPRAZOLAM 0.5 MG PO TABS: ORAL_TABLET | ORAL | 0 refills | Status: AC

## 2018-12-15 NOTE — Patient Instructions (Addendum)
I would still recommend considering seeing a psychiatrist for medication management . I'm glad to see that you have improved some with counseling, but am not sure that we should wait a very long time before making adjustments, if you're still having a lot of emotional eating.  Consider calling [facility] to get on their waiting list--as it may be long, so that if the intermittent fasting isn't helping as much as you'd like, you have another option available.  Please try and monitor your blood pressure at home (it was a little high today), and send [place] a message with your values in the next few weeks (goal is under 130/80).  We discussed that your liver tests were higher (possibly related to weight gain and persistent elevated triglycerides). CT was a year ago.  I recommend discussing this with your gastroenterologist. We will send copies of your labs to Dr. Oletta Lamas.  Likely due to have repeat imaging. Cutting out the sweets, fatty/fried foods, and losing weight will help your liver. Increase your fish oil to 4/day (we can consider changing to a prescription such as Vascepa or Lovaza if you can't tolerate the higher dose of fish oil--recommending 4048m daily).

## 2018-12-16 DIAGNOSIS — F3289 Other specified depressive episodes: Secondary | ICD-10-CM | POA: Diagnosis not present

## 2018-12-16 DIAGNOSIS — F419 Anxiety disorder, unspecified: Secondary | ICD-10-CM | POA: Diagnosis not present

## 2018-12-23 DIAGNOSIS — F419 Anxiety disorder, unspecified: Secondary | ICD-10-CM | POA: Diagnosis not present

## 2018-12-23 DIAGNOSIS — F3289 Other specified depressive episodes: Secondary | ICD-10-CM | POA: Diagnosis not present

## 2018-12-26 DIAGNOSIS — M47816 Spondylosis without myelopathy or radiculopathy, lumbar region: Secondary | ICD-10-CM | POA: Diagnosis not present

## 2018-12-26 DIAGNOSIS — M545 Low back pain: Secondary | ICD-10-CM | POA: Diagnosis not present

## 2018-12-30 DIAGNOSIS — F419 Anxiety disorder, unspecified: Secondary | ICD-10-CM | POA: Diagnosis not present

## 2018-12-30 DIAGNOSIS — F3289 Other specified depressive episodes: Secondary | ICD-10-CM | POA: Diagnosis not present

## 2019-01-04 ENCOUNTER — Ambulatory Visit: Payer: BC Managed Care – PPO | Admitting: Family Medicine

## 2019-01-04 ENCOUNTER — Encounter: Payer: Self-pay | Admitting: Family Medicine

## 2019-01-04 ENCOUNTER — Other Ambulatory Visit: Payer: Self-pay

## 2019-01-04 VITALS — BP 114/79 | HR 80 | Ht 64.0 in | Wt 269.5 lb

## 2019-01-04 DIAGNOSIS — R197 Diarrhea, unspecified: Secondary | ICD-10-CM

## 2019-01-04 NOTE — Patient Instructions (Signed)
Please come pick up the collection containers for stool exams.  We will check 3 specimens for ova and parasite, and one also for C. Difficile and stool culture.  Return these to the lab in our office.  Please avoid all dairy products for the next 5-7 days. Stick to a [employer] (bananas, rice, applesauce and toast), bland diet, no fatty or greasy foods. You can add in some baked chicken if needed.  If we end up treating you with metronidazole (flagyl) for a parasitic infection, we discussed the need to avoid all alcohol while taking it).  If you have ongoing diarrhea after meals (especially after fatty foods), this can be related to not having your gall bladder, and we briefly discussed using bile acid sequestrants with meals (ie Questran).  This may be an issue in the long run, but I don't think this is causing your current episode of frequent watery diarrhea.   Diarrhea, Adult Diarrhea is when you pass loose and watery poop (stool) often. Diarrhea can make you feel weak and cause you to lose water in your body (get dehydrated). Losing water in your body can cause you to:  Feel tired and thirsty.  Have a dry mouth.  Go pee (urinate) less often. Diarrhea often lasts 2-3 days. However, it can last longer if it is a sign of something more serious. It is important to treat your diarrhea as told by your doctor. Follow these instructions at home: Eating and drinking     Follow these instructions as told by your doctor:  Take an ORS (oral rehydration solution). This is a drink that helps you replace fluids and minerals your body lost. It is sold at pharmacies and stores.  Drink plenty of fluids, such as: ? Water. ? Ice chips. ? Diluted fruit juice. ? Low-calorie sports drinks. ? Milk, if you want.  Avoid drinking fluids that have a lot of sugar or caffeine in them.  Eat bland, easy-to-digest foods in small amounts as you are able. These foods include: ? Bananas. ? Applesauce. ? Rice.  ? Low-fat (lean) meats. ? Toast. ? Crackers.  Avoid alcohol.  Avoid spicy or fatty foods.  Medicines  Take over-the-counter and prescription medicines only as told by your doctor.  If you were prescribed an antibiotic medicine, take it as told by your doctor. Do not stop using the antibiotic even if you start to feel better. General instructions   Wash your hands often using soap and water. If soap and water are not available, use a hand sanitizer. Others in your home should wash their hands as well. Hands should be washed: ? After using the toilet or changing a diaper. ? Before preparing, cooking, or serving food. ? While caring for a sick person. ? While visiting someone in a hospital.  Drink enough fluid to keep your pee (urine) pale yellow.  Rest at home while you get better.  Watch your condition for any changes.  Take a warm bath to help with any burning or pain from having diarrhea.  Keep all follow-up visits as told by your doctor. This is important. Contact a doctor if:  You have a fever.  Your diarrhea gets worse.  You have new symptoms.  You cannot keep fluids down.  You feel light-headed or dizzy.  You have a headache.  You have muscle cramps. Get help right away if:  You have chest pain.  You feel very weak or you pass out (faint).  You have bloody  or black poop or poop that looks like tar.  You have very bad pain, cramping, or bloating in your belly (abdomen).  You have trouble breathing or you are breathing very quickly.  Your heart is beating very quickly.  Your skin feels cold and clammy.  You feel confused.  You have signs of losing too much water in your body, such as: ? Dark pee, very little pee, or no pee. ? Cracked lips. ? Dry mouth. ? Sunken eyes. ? Sleepiness. ? Weakness. Summary  Diarrhea is when you pass loose and watery poop (stool) often.  Diarrhea can make you feel weak and cause you to lose water in your body  (get dehydrated).  Take an ORS (oral rehydration solution). This is a drink that is sold at pharmacies and stores.  Eat bland, easy-to-digest foods in small amounts as you are able.  Contact a doctor if your condition gets worse. Get help right away if you have signs that you have lost too much water in your body. This information is not intended to replace advice given to you by your health care provider. Make sure you discuss any questions you have with your health care provider. Document Released: 12/02/2007 Document Revised: 11/19/2017 Document Reviewed: 11/19/2017 Elsevier Patient Education  2020 [employer].

## 2019-01-04 NOTE — Progress Notes (Signed)
Start time: 11:13 End time: 11:31  Virtual Visit via Video Note  I connected with Susan Gould on 01/04/19 at 10:45 AM EDT by a video enabled telemedicine application and verified that I am speaking with the correct person using two identifiers.  Location: Patient: home Provider: office   I discussed the limitations of evaluation and management by telemedicine and the availability of in person appointments. The patient expressed understanding and agreed to proceed. Consents to insurance being filed for this visit.  History of Present Illness:  Chief Complaint  Patient presents with  . Diarrhea    x 74month When she was last here she mentioned this, it was off and on. Now over the last week it is just constant. Has some nausea, but no vomiting. Has been taking immodium, which helps but then goes back to diarrhea.    She had mentioned some loose stools at her last visit.  Over the last few days, diarrhea has worsened/changed.  On Saturday (4 days ago), she had a completely watery stool. She took Imodium, and didn't have a bowel movement again until Monday, when it was slightly formed. Today she had completely watery stool, went 3 times so far today. Slight cramping today, for the first time, otherwise no cramping.  On "diarrhea days", can occur at least twice, not as frequent as the diarrhea she is having today.  No recent ABX, no travel, undercooked foods or spoiled foods. One of her cats is diagnosed with Giardia, "unable to be treated" refusing to take the flagyl twice daily as directed. She is wondering if she could get this from her cat.  Still doing intermittent fasting, window is 12-6. Takes metformin around 9am.  Didn't notice any particular change in stools related to taking the metformin earlier.  S/p cholecystectomy Diet is slightly improved compared to her last visit, "not great" No change in diarrhea related to her meals. (hadn't eaten yet today and had 3 watery  stools).  PMH, PSH, SH reviewed  Outpatient Encounter Medications as of 01/04/2019  Medication Sig Note  . ALPRAZolam (XANAX) 0.5 MG tablet TAKE 1/2 -1 TABLET BY MOUTH 3 TIMES DAILY AS NEEDED FOR ANXIETY   . atorvastatin (LIPITOR) 80 MG tablet Take 1 tablet (80 mg total) by mouth daily.   .Marland KitchenbuPROPion (WELLBUTRIN XL) 300 MG 24 hr tablet Take 1 tablet (300 mg total) by mouth daily.   . Calcium Carbonate-Vitamin D (CALCIUM + D) 600-200 MG-UNIT TABS Take 2 tablets by mouth daily.     . citalopram (CELEXA) 40 MG tablet TAKE 1 TABLET(40 MG) BY MOUTH DAILY   . diclofenac (VOLTAREN) 75 MG EC tablet TAKE 1 TABLET(75 MG) BY MOUTH TWICE DAILY   . Ferrous Sulfate (IRON) 325 (65 Fe) MG TABS Take 1 tablet by mouth daily.   . flintstones complete (FLINTSTONES) 60 MG chewable tablet Chew 1 tablet by mouth daily.   . fluticasone (FLONASE) 50 MCG/ACT nasal spray INSTILL 1 TO 2 SPRAYS INTO EACH NOSTRIL ONCE A DAY   . Fluticasone Furoate (ARNUITY ELLIPTA) 100 MCG/ACT AEPB Inhale 1 puff into the lungs daily.   .Marland Kitchengabapentin (NEURONTIN) 300 MG capsule TK ONE C PO BID   . hydrochlorothiazide (MICROZIDE) 12.5 MG capsule TAKE 1 CAPSULE(12.5 MG) BY MOUTH DAILY   . loratadine (CLARITIN) 10 MG tablet Take 10 mg by mouth daily.   .Marland Kitchenlosartan (COZAAR) 25 MG tablet TAKE 1 TABLET(25 MG) BY MOUTH DAILY   . metFORMIN (GLUCOPHAGE-XR) 500 MG 24 hr tablet  TAKE 2 TABLETS BY MOUTH EVERY MORNING AND 1 TABLET EVERY EVENING   . Omega-3 Fatty Acids (FISH OIL) 1200 MG CAPS Take 3 capsules by mouth daily.   Marland Kitchen omeprazole (PRILOSEC) 20 MG capsule Take 20 mg by mouth daily.   03/09/2017: Takes about qod  . ondansetron (ZOFRAN) 4 MG tablet Take 4 mg by mouth every 8 (eight) hours as needed for nausea or vomiting.   . vitamin B-12 (CYANOCOBALAMIN) 1000 MCG tablet Take 1,000 mcg by mouth daily.     . vitamin C (ASCORBIC ACID) 500 MG tablet Take 500 mg by mouth daily.    No facility-administered encounter medications on file as of 01/04/2019.     Allergies  Allergen Reactions  . Meloxicam Itching   ROS:  No fever or chills, GI complaints per HPI. No vomiting. No urinary complaints. No blood in the stool. No URI complaints.     Observations/Objective:  BP 114/79   Pulse 80   Ht 5' 4"  (1.626 m)   Wt 269 lb 8 oz (122.2 kg)   LMP 02/09/2009 (Exact Date)   BMI 46.26 kg/m  Wt Readings from Last 3 Encounters:  01/04/19 269 lb 8 oz (122.2 kg)  12/15/18 267 lb 6.4 oz (121.3 kg)  12/06/18 267 lb 12.8 oz (121.5 kg)   Well appearing, alert female, in no distress Normal eye contact, speech, in good spirits. Exam limited due to virtual nature of the visit.  Assessment and Plan:  Diarrhea, unspecified type - Plan: Cdiff NAA+O+P+Stool Culture, Ova and parasite examination, Ova and parasite examination   BRAT diet Dairy-free  Stool studies (check O+P x 3, plus others) Discussed flagyl side effects, avoidance of alcohol, in case rx is needed upon results.  Discussed potential impact of lack of gallbladder in contributing to diarrhea after fatty foods.  If ongoing diarrhea after eating, consider Questran trial.  Discussed metformin ER recall (and if needed to change to regular metformin, can also have GI effects).   Follow Up Instructions:    I discussed the assessment and treatment plan with the patient. The patient was provided an opportunity to ask questions and all were answered. The patient agreed with the plan and demonstrated an understanding of the instructions.   The patient was advised to call back or seek an in-person evaluation if the symptoms worsen or if the condition fails to improve as anticipated.  I provided 18 minutes of non-face-to-face time during this encounter.   Vikki Ports, MD

## 2019-01-06 DIAGNOSIS — F3289 Other specified depressive episodes: Secondary | ICD-10-CM | POA: Diagnosis not present

## 2019-01-06 DIAGNOSIS — F419 Anxiety disorder, unspecified: Secondary | ICD-10-CM | POA: Diagnosis not present

## 2019-01-09 DIAGNOSIS — R197 Diarrhea, unspecified: Secondary | ICD-10-CM | POA: Diagnosis not present

## 2019-01-12 ENCOUNTER — Encounter: Payer: Self-pay | Admitting: Family Medicine

## 2019-01-13 DIAGNOSIS — F419 Anxiety disorder, unspecified: Secondary | ICD-10-CM | POA: Diagnosis not present

## 2019-01-13 DIAGNOSIS — F3289 Other specified depressive episodes: Secondary | ICD-10-CM | POA: Diagnosis not present

## 2019-01-13 LAB — CDIFF NAA+O+P+STOOL CULTURE
E coli, Shiga toxin Assay: NEGATIVE
Toxigenic C. Difficile by PCR: NEGATIVE

## 2019-01-13 LAB — OVA AND PARASITE EXAMINATION

## 2019-01-15 ENCOUNTER — Encounter: Payer: Self-pay | Admitting: Family Medicine

## 2019-01-15 MED ORDER — GABAPENTIN 300 MG PO CAPS: ORAL_CAPSULE | ORAL | 0 refills | Status: DC

## 2019-01-17 ENCOUNTER — Other Ambulatory Visit: Payer: Self-pay | Admitting: Family Medicine

## 2019-01-17 NOTE — Telephone Encounter (Signed)
Is this okay to refill? Last refilled in 03/2018

## 2019-01-17 NOTE — Telephone Encounter (Signed)
This is prescribed by her pulmonary doc Chase Caller), not [place].  Needs to go to him for refills (they were last prescriber)

## 2019-01-20 DIAGNOSIS — F3289 Other specified depressive episodes: Secondary | ICD-10-CM | POA: Diagnosis not present

## 2019-01-20 DIAGNOSIS — F419 Anxiety disorder, unspecified: Secondary | ICD-10-CM | POA: Diagnosis not present

## 2019-01-23 ENCOUNTER — Telehealth: Payer: Self-pay | Admitting: Internal Medicine

## 2019-01-23 MED ORDER — ARNUITY ELLIPTA 100 MCG/ACT IN AEPB: 1.0000 | INHALATION_SPRAY | Freq: Every day | RESPIRATORY_TRACT | 3 refills | Status: DC

## 2019-01-23 NOTE — Telephone Encounter (Signed)
Called and spoke to pt. Pt states she is needing her Arnuity refilled but it was recently denied. Advised pt that it has been over a year which is likely why it was denied. Pt verbalized understanding. RX refill sent to preferred pharmacy and appt made with Lazaro Arms, NP, for 01/25/2019. Pt verbalized understanding and denied any further questions or concerns at this time.

## 2019-01-23 NOTE — Telephone Encounter (Signed)
Please see other message. Will sign off.

## 2019-01-25 ENCOUNTER — Other Ambulatory Visit: Payer: Self-pay

## 2019-01-25 ENCOUNTER — Encounter: Payer: Self-pay | Admitting: Nurse Practitioner

## 2019-01-25 ENCOUNTER — Ambulatory Visit: Payer: BC Managed Care – PPO | Admitting: Nurse Practitioner

## 2019-01-25 ENCOUNTER — Other Ambulatory Visit: Payer: Self-pay | Admitting: General Surgery

## 2019-01-25 DIAGNOSIS — G4733 Obstructive sleep apnea (adult) (pediatric): Secondary | ICD-10-CM | POA: Diagnosis not present

## 2019-01-25 DIAGNOSIS — J4540 Moderate persistent asthma, uncomplicated: Secondary | ICD-10-CM | POA: Diagnosis not present

## 2019-01-25 NOTE — Assessment & Plan Note (Signed)
Patient states that she uses her CPAP nightly.  She states that she does benefit from use of CPAP.  She feels much less drowsy during the day.  She does complain of issues with pressure settings.  She states the pressure may be too high.  CPAP compliance report shows 100% compliance with AHI at 2.8.  Patient CPAP is currently on AutoSet 5-20 cm H2O.  Patient Instructions  Asthma: Continue Arnuity Continue albuterol if needed  OSA: Patient continues to benefit from CPAP with good compliance and control documented Will change pressure to 5-15 cmH20 Will need to check download in 1 month Continue CPAP at current settings Continue current medications Goal of 4 hours or more usage per night Maintain healthy weight Do not drive if drowsy   Follow up with Dr. [name]  in 1 year or sooner if needed

## 2019-01-25 NOTE — Progress Notes (Addendum)
@Patient  ID: Susan Gould, female    DOB: Feb 03, 1966, 53 y.o.   MRN: [id number]  Chief Complaint  Patient presents with  . Follow-up    Referring provider: Rita Ohara, MD   HPI 53 year old female never smoker with OSA who is followed by Dr. [name] Patient is followed by Dr. Chase Caller for asthma Maintenance: Arnuity, albuterol as needed CPAP AutoSet 5-20 cm H2O  Tests: NPSG 04/23/06- AHI 42/ hr, desat to 89%  OV 01/25/19 - follow up  Patient presents today for follow-up visit.  History of OSA and is on CPAP.  She states that she uses her CPAP nightly.  She states that she does benefit from use of CPAP.  She feels much less drowsy during the day.  She does complain of issues with pressure settings.  She states the pressure may be too high.  CPAP compliance report shows 100% compliance with AHI at 2.8.  Patient CPAP is currently on AutoSet 5-20 cm H2O.  Patient does have a history of asthma.  She is on Arnuity and is compliant.  She states that she has not had to use her albuterol inhaler in the past several months.  She last saw Dr. Chase Caller over 1 year ago.  She states this is been a stable interval for her and that she has not had any recent flares or respiratory infections. Denies f/c/s, n/v/d, hemoptysis, PND, leg swelling.       Allergies  Allergen Reactions  . Meloxicam Itching    Immunization History  Administered Date(s) Administered  . Influenza Split 04/17/2012  . Influenza Whole 03/29/2009, 03/17/2010, 04/23/2011  . Influenza,inj,Quad PF,6+ Mos 03/07/2013, 02/14/2014, 02/21/2015, 03/03/2016, 03/09/2017, 03/07/2018  . Influenza,inj,quad, With Preservative 03/26/2018  . Pneumococcal Polysaccharide-23 03/07/2013  . Td 08/14/2004  . Tdap 07/29/2011  . Zoster Recombinat (Shingrix) 10/28/2016, 03/09/2017    Past Medical History:  Diagnosis Date  . Allergic rhinitis, cause unspecified    cat/dog/ragweed/mold/wool, s/p immunotherapy x 3-4 yrs  . Anxiety  2006  . Asthma    (Dr. Joya Gaskins)  . Colon polyps 2007   Dr. Oletta Lamas (she was told repeat in 10 years)  . Depression 2006  . Endometriosis    Dr. Leo Grosser  . Family history of breast cancer in female   . GERD (gastroesophageal reflux disease)   . Hemorrhoid    internal and external  . Hyperlipidemia   . Hypertension   . Insulin resistance    Dr. Leo Grosser  . Medial meniscus tear    Left. Surgery [name].  NOT seen on MRI 06/2013  . Obesity   . OSA (obstructive sleep apnea)    on CPAP (Dr. Gwenette Greet)    Tobacco History: Social History   Tobacco Use  Smoking Status Never Smoker  Smokeless Tobacco Never Used   Counseling given: Not Answered   Outpatient Encounter Medications as of 01/25/2019  Medication Sig  . ALPRAZolam (XANAX) 0.5 MG tablet TAKE 1/2 -1 TABLET BY MOUTH 3 TIMES DAILY AS NEEDED FOR ANXIETY  . atorvastatin (LIPITOR) 80 MG tablet Take 1 tablet (80 mg total) by mouth daily.  Marland Kitchen buPROPion (WELLBUTRIN XL) 300 MG 24 hr tablet Take 1 tablet (300 mg total) by mouth daily.  . Calcium Carbonate-Vitamin D (CALCIUM + D) 600-200 MG-UNIT TABS Take 2 tablets by mouth daily.    . citalopram (CELEXA) 40 MG tablet TAKE 1 TABLET(40 MG) BY MOUTH DAILY  . diclofenac (VOLTAREN) 75 MG EC tablet TAKE 1 TABLET(75 MG) BY MOUTH TWICE DAILY  .  Ferrous Sulfate (IRON) 325 (65 Fe) MG TABS Take 1 tablet by mouth daily.  . flintstones complete (FLINTSTONES) 60 MG chewable tablet Chew 1 tablet by mouth daily.  . fluticasone (FLONASE) 50 MCG/ACT nasal spray INSTILL 1 TO 2 SPRAYS INTO EACH NOSTRIL ONCE A DAY  . Fluticasone Furoate (ARNUITY ELLIPTA) 100 MCG/ACT AEPB Inhale 1 puff into the lungs daily.  Marland Kitchen gabapentin (NEURONTIN) 300 MG capsule TAKE ONE CAPSULE BY MOUTH TWICE DAILY  . hydrochlorothiazide (MICROZIDE) 12.5 MG capsule TAKE 1 CAPSULE(12.5 MG) BY MOUTH DAILY  . loratadine (CLARITIN) 10 MG tablet Take 10 mg by mouth daily.  Marland Kitchen losartan (COZAAR) 25 MG tablet TAKE 1 TABLET(25 MG) BY MOUTH DAILY   . metFORMIN (GLUCOPHAGE-XR) 500 MG 24 hr tablet TAKE 2 TABLETS BY MOUTH EVERY MORNING AND 1 TABLET EVERY EVENING  . Omega-3 Fatty Acids (FISH OIL) 1200 MG CAPS Take 3 capsules by mouth daily.  Marland Kitchen omeprazole (PRILOSEC) 20 MG capsule Take 20 mg by mouth daily.    . ondansetron (ZOFRAN) 4 MG tablet Take 4 mg by mouth every 8 (eight) hours as needed for nausea or vomiting.  . vitamin B-12 (CYANOCOBALAMIN) 1000 MCG tablet Take 1,000 mcg by mouth daily.    . vitamin C (ASCORBIC ACID) 500 MG tablet Take 500 mg by mouth daily.   No facility-administered encounter medications on file as of 01/25/2019.      Review of Systems  Review of Systems  Constitutional: Negative.  Negative for chills and fever.  HENT: Negative.   Respiratory: Negative for cough, shortness of breath and wheezing.   Cardiovascular: Negative.  Negative for chest pain, palpitations and leg swelling.  Gastrointestinal: Negative.   Allergic/Immunologic: Negative.   Neurological: Negative.   Psychiatric/Behavioral: Negative.        Physical Exam  BP 130/78 (BP Location: Left Arm, Patient Position: Sitting, Cuff Size: Large)   Pulse 76   Temp 98.3 F (36.8 C)   Ht 5' 4"  (1.626 m)   Wt 268 lb (121.6 kg)   LMP 02/09/2009 (Exact Date)   SpO2 97%   BMI 46.00 kg/m   Wt Readings from Last 5 Encounters:  01/25/19 268 lb (121.6 kg)  01/04/19 269 lb 8 oz (122.2 kg)  12/15/18 267 lb 6.4 oz (121.3 kg)  12/06/18 267 lb 12.8 oz (121.5 kg)  09/14/18 268 lb 9.6 oz (121.8 kg)     Physical Exam Vitals signs and nursing note reviewed.  Constitutional:      General: She is not in acute distress.    Appearance: She is well-developed.  Cardiovascular:     Rate and Rhythm: Normal rate and regular rhythm.  Pulmonary:     Effort: Pulmonary effort is normal. No respiratory distress.     Breath sounds: Normal breath sounds. No wheezing or rhonchi.  Musculoskeletal:        General: No swelling.  Neurological:     Mental Status:  She is alert and oriented to person, place, and time.     Assessment & Plan:   OSA (obstructive sleep apnea) Patient states that she uses her CPAP nightly.  She states that she does benefit from use of CPAP.  She feels much less drowsy during the day.  She does complain of issues with pressure settings.  She states the pressure may be too high.  CPAP compliance report shows 100% compliance with AHI at 2.8.  Patient CPAP is currently on AutoSet 5-20 cm H2O.  Patient Instructions  Asthma: Continue Arnuity Continue  albuterol if needed  OSA: Patient continues to benefit from CPAP with good compliance and control documented Will change pressure to 5-15 cmH20 Will need to check download in 1 month Continue CPAP at current settings Continue current medications Goal of 4 hours or more usage per night Maintain healthy weight Do not drive if drowsy   Follow up with Dr. [name]  in 1 year or sooner if needed       Moderate persistent asthma in adult without complication Patient does have a history of asthma.  She is on Arnuity and is compliant.  She states that she has not had to use her albuterol inhaler in the past several months.  She last saw Dr. Chase Caller over 1 year ago.  She states this is been a stable interval for her and that she has not had any recent flares or respiratory infections.   Patient Instructions  Asthma: Continue Arnuity Continue albuterol if needed  OSA: Patient continues to benefit from CPAP with good compliance and control documented Will change pressure to 5-15 cmH20 Will need to check download in 1 month Continue CPAP at current settings Continue current medications Goal of 4 hours or more usage per night Maintain healthy weight Do not drive if drowsy   Follow up with Dr. [name]  in 1 year or sooner if needed         Fenton Foy, NP 01/25/2019

## 2019-01-25 NOTE — Progress Notes (Addendum)
Will need CPAP download in 1 month

## 2019-01-25 NOTE — Addendum Note (Signed)
Addended by: Nena Polio on: 01/25/2019 12:26 PM   Modules accepted: Orders

## 2019-01-25 NOTE — Patient Instructions (Addendum)
Asthma: Continue Arnuity Continue albuterol if needed  OSA: Patient continues to benefit from CPAP with good compliance and control documented Will change pressure to 5-15 cmH20 Will need to check download in 1 month Continue CPAP at current settings Continue current medications Goal of 4 hours or more usage per night Maintain healthy weight Do not drive if drowsy   Follow up with Dr. [name]  in 1 year or sooner if needed

## 2019-01-25 NOTE — Assessment & Plan Note (Signed)
Patient does have a history of asthma.  She is on Arnuity and is compliant.  She states that she has not had to use her albuterol inhaler in the past several months.  She last saw Dr. Chase Caller over 1 year ago.  She states this is been a stable interval for her and that she has not had any recent flares or respiratory infections.   Patient Instructions  Asthma: Continue Arnuity Continue albuterol if needed  OSA: Patient continues to benefit from CPAP with good compliance and control documented Will change pressure to 5-15 cmH20 Will need to check download in 1 month Continue CPAP at current settings Continue current medications Goal of 4 hours or more usage per night Maintain healthy weight Do not drive if drowsy   Follow up with Dr. [name]  in 1 year or sooner if needed

## 2019-01-27 DIAGNOSIS — F3289 Other specified depressive episodes: Secondary | ICD-10-CM | POA: Diagnosis not present

## 2019-01-27 DIAGNOSIS — F419 Anxiety disorder, unspecified: Secondary | ICD-10-CM | POA: Diagnosis not present

## 2019-01-28 DIAGNOSIS — F332 Major depressive disorder, recurrent severe without psychotic features: Secondary | ICD-10-CM | POA: Diagnosis not present

## 2019-02-10 DIAGNOSIS — F419 Anxiety disorder, unspecified: Secondary | ICD-10-CM | POA: Diagnosis not present

## 2019-02-10 DIAGNOSIS — F3289 Other specified depressive episodes: Secondary | ICD-10-CM | POA: Diagnosis not present

## 2019-02-17 DIAGNOSIS — F3289 Other specified depressive episodes: Secondary | ICD-10-CM | POA: Diagnosis not present

## 2019-02-17 DIAGNOSIS — F419 Anxiety disorder, unspecified: Secondary | ICD-10-CM | POA: Diagnosis not present

## 2019-02-27 DIAGNOSIS — F419 Anxiety disorder, unspecified: Secondary | ICD-10-CM | POA: Diagnosis not present

## 2019-02-27 DIAGNOSIS — F3289 Other specified depressive episodes: Secondary | ICD-10-CM | POA: Diagnosis not present

## 2019-03-06 DIAGNOSIS — F419 Anxiety disorder, unspecified: Secondary | ICD-10-CM | POA: Diagnosis not present

## 2019-03-06 DIAGNOSIS — F3289 Other specified depressive episodes: Secondary | ICD-10-CM | POA: Diagnosis not present

## 2019-03-11 DIAGNOSIS — F331 Major depressive disorder, recurrent, moderate: Secondary | ICD-10-CM | POA: Diagnosis not present

## 2019-03-12 ENCOUNTER — Other Ambulatory Visit: Payer: Self-pay | Admitting: Medical

## 2019-03-12 DIAGNOSIS — I10 Essential (primary) hypertension: Secondary | ICD-10-CM

## 2019-03-13 DIAGNOSIS — F419 Anxiety disorder, unspecified: Secondary | ICD-10-CM | POA: Diagnosis not present

## 2019-03-13 DIAGNOSIS — F3289 Other specified depressive episodes: Secondary | ICD-10-CM | POA: Diagnosis not present

## 2019-03-14 ENCOUNTER — Encounter: Payer: Self-pay | Admitting: Family Medicine

## 2019-03-14 DIAGNOSIS — I10 Essential (primary) hypertension: Secondary | ICD-10-CM

## 2019-03-14 MED ORDER — HYDROCHLOROTHIAZIDE 12.5 MG PO CAPS: 12.5000 mg | ORAL_CAPSULE | Freq: Every day | ORAL | 1 refills | Status: DC

## 2019-03-20 DIAGNOSIS — F3289 Other specified depressive episodes: Secondary | ICD-10-CM | POA: Diagnosis not present

## 2019-03-20 DIAGNOSIS — F419 Anxiety disorder, unspecified: Secondary | ICD-10-CM | POA: Diagnosis not present

## 2019-03-21 ENCOUNTER — Encounter: Payer: Self-pay | Admitting: Family Medicine

## 2019-03-21 NOTE — Progress Notes (Signed)
Chief Complaint  Patient presents with  . Hypertension    nonfasting (coffee with cream and sugar) med check. Has some places on her arms she would like you to look at-somewhat itchy, not painful and been there about a month. Also has a mole or two that she would like you to look at in between her breasts.    Patient presents for 3 month f/u on chronic problems.  Itching of right anterior forearm.  A few itchy bumps noted in the last few weeks. She reports a few scattered bumps and a small cluster, and reports that they started like blisters. They are itchy. She also has one on the left arm near the lateral elbow. She has not tried any medication for them yet.  No known new contacts/allergic exposures.  Obesity: Weight was 249# 9.6 oz at her 05/2018 visit.  She gained to 268# 0.6 oz by her March 2020 visit, and has stayed at this weight. At her last visit she reported she had been buying/eating a lot of junk food, had been more depressed and has a lot of emotional eating.  We had discussed getting counseling, possibly seeing psychiatrist, and [facility] was recommended. She did see therapist and psychiatrist (see below).  She is no longer doing intermittent fasting (had tried it for a while, eating 12-6 pm, with a longer window on the weekends; had some diarrhea during the weekdays).  Diarrhea: seen a couple of months ago with diarrhea--seemed to be worse on the weekdays (she ?related to intermittent fasting). Stools studies were normal in July.  Diarrhea still comes and goes, less often and severe than at last visit, no longer following pattern of weekdays.  Wt Readings from Last 3 Encounters:  01/25/19 268 lb (121.6 kg)  01/04/19 269 lb 8 oz (122.2 kg)  12/15/18 267 lb 6.4 oz (121.3 kg)   Fatty liver--lab results were sent to her GI (Dr. Oletta Lamas at [facility]), and she was to f/u to discuss.  She did not schedule OV there yet. Lab Results  Component Value Date   ALT 97 (H) 12/06/2018   AST 63 (H)  12/06/2018   ALKPHOS 92 12/06/2018   BILITOT 0.3 12/06/2018   Depression/anxiety: She saw Dr. Toy Care in 01/2019 and was started on Trintellix 59m, tapering off wellbutrin and celexa. She has been having a lot of nausea, being treated with zofran (needing about 3x/week).  Has some moments of hopefulness, fleeting.  Last saw her last week.  Elected to continue at same dose for another month. She continues to see Dr. OBrent Bulla "[facility]immie Patricia, at [facility] She sees her weekly.  She is being taught coping skills.  Dealing with anniversary of dad's death, recent loss of her 172year old cat, ETerrence Dupont last week. Has been irritable, short-tempered, feeling somewhat burned out related to work. Plans to go to the beach in October. She reports that [facility] does CBT.  Feels like she needs to guide her sessions more to help with emotional eating.  Deals with whatever has come up that week.  Chronic back pain, with bilateral sciatica.  She is under the care of Dr. IRon Agee She had 2nd medial branch block in her back, bilateral, without any pain relief.  She then had radiofrequency ablation bilaterally in April, at 3 levels (per pt).  She has noted benefit.  Still has muscular pain, but the nerve pain is reduced.  Still has some sciatica (reportedly "a different issue").  Pain is better, able to walk  a little more.  She is taking 332m BID of gabapentin. She denies sedation.  Numbness in feet is a little better, hasn't noticed much benefit on her back. Taking diclofenac just once daily in evening, taking tylenol in the morning. (to cut back on NSAIDs to protect her kidneys).  Working okay like this for pain control. This remains unchanged from her last visit, and this regimen remains effective.  Hypertension: compliant with taking 12.560mof HCTZ and losartan. Doesn't check BP elsewhere.  Denies side effects, headaches, dizziness, chest pain palpitations, edema.  BP Readings from Last 3 Encounters:   01/25/19 130/78  01/04/19 114/79  12/15/18 138/80   Not getting any regular exercise.  Did walk yesterday, felt much better afterwards.  Hyperlipidemia follow-up: Patient is reportedly following a low cholesterol diet. Compliant [id number] without side effects. She was asked to increase fish oil to 4/d after TG were high on last check.  At that time, she reported poor diet (fast foods, frozen pizza, sweets). She states diet has improved some.  Changed back to good fats (avocado) and egg whites, trying to make healthier choices. She is currently taking 2000m45mD of fish oil.  Lab Results  Component Value Date   CHOL 161 12/06/2018   HDL 47 12/06/2018   LDLCALC 68 12/06/2018   TRIG 231 (H) 12/06/2018   CHOLHDL 3.4 12/06/2018    PCOS/insulin resistance/suspected DM: Compliant with metformin.  Denies polydipsia, polyuria. Lab Results  Component Value Date   HGBA1C 5.8 (H) 12/06/2018   Asthma:  Doing well on Arnuity.  Hasn't needed any albuterol OSA:  Compliant with CPAP. Denies feeling unrefreshed, and is less sleepy during the day since on CPAP.  She last saw pulmonary in July.   PMH, PSH, SH reviewed  Outpatient Encounter Medications as of 03/22/2019  Medication Sig Note  . ALPRAZolam (XANAX) 0.5 MG tablet TAKE 1/2 -1 TABLET BY MOUTH 3 TIMES DAILY AS NEEDED FOR ANXIETY   . atorvastatin (LIPITOR) 80 MG tablet Take 1 tablet (80 mg total) by mouth daily.   . Calcium Carbonate-Vitamin D (CALCIUM + D) 600-200 MG-UNIT TABS Take 2 tablets by mouth daily.     . diclofenac (VOLTAREN) 75 MG EC tablet TAKE 1 TABLET(75 MG) BY MOUTH TWICE DAILY 03/22/2019: Takes once daily  . Ferrous Sulfate (IRON) 325 (65 Fe) MG TABS Take 1 tablet by mouth daily.   . flintstones complete (FLINTSTONES) 60 MG chewable tablet Chew 1 tablet by mouth daily.   . fluticasone (FLONASE) 50 MCG/ACT nasal spray INSTILL 1 TO 2 SPRAYS INTO EACH NOSTRIL ONCE A DAY   . Fluticasone Furoate  (ARNUITY ELLIPTA) 100 MCG/ACT AEPB Inhale 1 puff into the lungs daily.   . gaMarland Kitchenapentin (NEURONTIN) 300 MG capsule TAKE ONE CAPSULE BY MOUTH TWICE DAILY   . hydrochlorothiazide (MICROZIDE) 12.5 MG capsule Take 1 capsule (12.5 mg total) by mouth daily.   . loMarland Kitchenatadine (CLARITIN) 10 MG tablet Take 10 mg by mouth daily.   . loMarland Kitchenartan (COZAAR) 25 MG tablet TAKE 1 TABLET(25 MG) BY MOUTH DAILY   . metFORMIN (GLUCOPHAGE-XR) 500 MG 24 hr tablet TAKE 2 TABLETS BY MOUTH EVERY MORNING AND 1 TABLET EVERY EVENING   . Multiple Vitamins-Minerals (MULTIVITAMIN WITH MINERALS) tablet Take 1 tablet by mouth daily.   . Omega-3 Fatty Acids (FISH OIL) 1200 MG CAPS Take 3 capsules by mouth daily.   . omMarland Kitchenprazole (PRILOSEC) 20 MG capsule Take 20 mg by mouth daily.     . vitamin B-12 (CYANOCOBALAMIN)  1000 MCG tablet Take 1,000 mcg by mouth daily.     Marland Kitchen vortioxetine HBr (TRINTELLIX) 20 MG TABS tablet Take 20 mg by mouth daily.   . [DISCONTINUED] citalopram (CELEXA) 40 MG tablet TAKE 1 TABLET(40 MG) BY MOUTH DAILY   . ondansetron (ZOFRAN) 4 MG tablet Take 4 mg by mouth every 8 (eight) hours as needed for nausea or vomiting.   . vitamin C (ASCORBIC ACID) 500 MG tablet Take 500 mg by mouth daily.   . [DISCONTINUED] buPROPion (WELLBUTRIN XL) 300 MG 24 hr tablet Take 1 tablet (300 mg total) by mouth daily.    No facility-administered encounter medications on file as of 03/22/2019.    Allergies  Allergen Reactions  . Meloxicam Itching    ROS:  No fever, chills, headaches, dizziness, chest pain, shortness of breath.  No abdominal pain. Diarrhea is less often, intermittent.  Has nausea from her new anti-depressant, zofran is effective.  +depression/grief, getting counseling and med changes.  Has some "moments of clarity" where she feels better.   PHYSICAL EXAM:  BP (!) 150/10   Pulse 84   Temp (!) 97.3 F (36.3 C) (Other (Comment))   Ht 5' 4"  (1.626 m)   Wt 272 lb 12.8 oz (123.7 kg)   LMP 02/09/2009 (Exact Date)   BMI  46.83 kg/m   170/94 on repeat by MD  Wt Readings from Last 3 Encounters:  03/22/19 272 lb 12.8 oz (123.7 kg)  01/25/19 268 lb (121.6 kg)  01/04/19 269 lb 8 oz (122.2 kg)   Pleasant, obese female.  During the visit she became tearful and upset.  She is frustrated and upset with herself for knowing what she should be doing and eating, but cannot do it. HEENT: conjunctiva and sclera are clear, EOMI.  Wearing mask Neck: no lymphadenopathy, thyromegaly or mass Heart: regular rate and rhythm, no murmur Lungs: clear, no wheezing Back: no spinal or CVA tenderness Abdomen: soft, obese, nontender Extremities: no edema Skin: Lighter colored SK's on breasts--the one at the medial right breast is inflamed/irritated.  No surrounding erythema or drainage Papules on forearms--there are 2 areas on the right anterior forearm, one with a cluster of 3 bumps, and the other a single papule, somewhat scarred. She also has one on the left near the lateral elbow. Neuro: alert and oriented, normal gait Psych: depressed, full range of affect.  Tearful at one point during visit. Normal eye contact, speech, hygiene and grooming   ASSESSMENT/PLAN:  Mixed hyperlipidemia - reviewed proper diet . recheck in 3 mos, cont current meds  Hepatic steatosis - encouraged weight loss, lowfat diet.  Recommended she schedule visit with Dr. Oletta Lamas (prior labs sent 3 mos ago)  Depression, recurrent ([place]) - Under care of psychiatrist and therapist. Encouraged her to discuss emotional eating at a visit  Chronic bilateral low back pain with bilateral sciatica - stable  Need for influenza vaccination - Plan: Flu Vaccine QUAD 6+ mos PF IM (Fluarix Quad PF)  Chronic low back pain - Plan: diclofenac (VOLTAREN) 75 MG EC tablet  Essential hypertension - very high today, not monitored elsewhere.  Definitely upset today.  Has monitor--to check BP's at home, exercise, low Na diet. Send list of BP's - Plan: losartan (COZAAR) 25 MG  tablet  Counseled extensively about diet, exercise, not beating herself up, setting small goals, weekly, not trying to make all changes at once.  Encouraged her to address emotional eating with her therapist. Needs to call Dr. Oletta Lamas to schedule appt  to discuss fatty liver/LFT's.  Visit was 40-45 mins, more than 1/2 spent counseling.  F/u 3  Mos with fasting labs prior Lipids, A1c, c-met

## 2019-03-22 ENCOUNTER — Encounter: Payer: Self-pay | Admitting: Family Medicine

## 2019-03-22 ENCOUNTER — Ambulatory Visit: Payer: BC Managed Care – PPO | Admitting: Family Medicine

## 2019-03-22 ENCOUNTER — Other Ambulatory Visit: Payer: Self-pay

## 2019-03-22 VITALS — BP 150/100 | HR 84 | Temp 97.3°F | Ht 64.0 in | Wt 272.8 lb

## 2019-03-22 DIAGNOSIS — E782 Mixed hyperlipidemia: Secondary | ICD-10-CM | POA: Diagnosis not present

## 2019-03-22 DIAGNOSIS — I10 Essential (primary) hypertension: Secondary | ICD-10-CM

## 2019-03-22 DIAGNOSIS — Z5181 Encounter for therapeutic drug level monitoring: Secondary | ICD-10-CM

## 2019-03-22 DIAGNOSIS — K760 Fatty (change of) liver, not elsewhere classified: Secondary | ICD-10-CM

## 2019-03-22 DIAGNOSIS — F339 Major depressive disorder, recurrent, unspecified: Secondary | ICD-10-CM | POA: Diagnosis not present

## 2019-03-22 DIAGNOSIS — M5441 Lumbago with sciatica, right side: Secondary | ICD-10-CM

## 2019-03-22 DIAGNOSIS — E8881 Metabolic syndrome: Secondary | ICD-10-CM

## 2019-03-22 DIAGNOSIS — Z23 Encounter for immunization: Secondary | ICD-10-CM

## 2019-03-22 DIAGNOSIS — M5442 Lumbago with sciatica, left side: Secondary | ICD-10-CM | POA: Diagnosis not present

## 2019-03-22 DIAGNOSIS — M545 Low back pain, unspecified: Secondary | ICD-10-CM

## 2019-03-22 DIAGNOSIS — G8929 Other chronic pain: Secondary | ICD-10-CM

## 2019-03-22 MED ORDER — DICLOFENAC SODIUM 75 MG PO TBEC: DELAYED_RELEASE_TABLET | ORAL | 1 refills | Status: DC

## 2019-03-22 MED ORDER — LOSARTAN POTASSIUM 25 MG PO TABS: ORAL_TABLET | ORAL | 1 refills | Status: DC

## 2019-03-22 NOTE — Patient Instructions (Addendum)
Please schedule an appointment with [facility] (Dr. Oletta Lamas) to discuss your diarrhea, as well as the elevated liver tests and fatty liver.  Try to work on addressing emotional eating with your therapist. You beat yourself up over your ability to not make good choices, and this needs to be addressed. Consider looking into Overeater's Anonymous.  Periodically try and check your blood pressure at home, even if just prior to virtual appointments with the psychiatrist or others. Cut back on the sodium in the diet and try and get regular exercise, as this helps lower blood pressure (and stress).  Email a list of your blood pressure sometime in the next month.  Normal is <130/80.  Please try and get regular exercise, even if just 10-15 minutes at a time.  Use hydrocortisone 1% twice daily to the itchy bumps on your arms. Use antibacterial ointment to the inflamed/irritated lesion on the right breast. These are seborrheic keratosis and other than getting irritated occasionally, shouldn't be a problem (don't turn into cancer).   Seborrheic Keratosis A seborrheic keratosis is a common, noncancerous (benign) skin growth. These growths are velvety, waxy, rough, tan, brown, or black spots that appear on the skin. These skin growths can be flat or raised, and scaly. What are the causes? The cause of this condition is not known. What increases the risk? You are more likely to develop this condition if you:  Have a family history of seborrheic keratosis.  Are 50 or older.  Are pregnant.  Have had estrogen replacement therapy. What are the signs or symptoms? Symptoms of this condition include growths on the face, chest, shoulders, back, or other areas. These growths:  Are usually painless, but may become irritated and itchy.  Can be yellow, brown, black, or other colors.  Are slightly raised or have a flat surface.  Are sometimes rough or wart-like in texture.  Are often velvety or waxy on the  surface.  Are round or oval-shaped.  Often occur in groups, but may occur as a single growth. How is this diagnosed? This condition is diagnosed with a medical history and physical exam.  A sample of the growth may be tested (skin biopsy).  You may need to see a skin specialist (dermatologist). How is this treated? Treatment is not usually needed for this condition, unless the growths are irritated or bleed often.  You may also choose to have the growths removed if you do not like their appearance. ? Most commonly, these growths are treated with a procedure in which liquid nitrogen is applied to "freeze" off the growth (cryosurgery). ? They may also be burned off with electricity (electrocautery) or removed by scraping (curettage). Follow these instructions at home:  Watch your growth for any changes.  Keep all follow-up visits as told by your health care provider. This is important.  Do not scratch or pick at the growth or growths. This can cause them to become irritated or infected. Contact a health care provider if:  You suddenly have many new growths.  Your growth bleeds, itches, or hurts.  Your growth suddenly becomes larger or changes color. Summary  A seborrheic keratosis is a common, noncancerous (benign) skin growth.  Treatment is not usually needed for this condition, unless the growths are irritated or bleed often.  Watch your growth for any changes.  Contact a health care provider if you suddenly have many new growths or your growth suddenly becomes larger or changes color.  Keep all follow-up visits as told  by your health care provider. This is important. This information is not intended to replace advice given to you by your health care provider. Make sure you discuss any questions you have with your health care provider. Document Released: 07/18/2010 Document Revised: 10/28/2017 Document Reviewed: 10/28/2017 Elsevier Patient Education  2020 [employer].

## 2019-03-27 DIAGNOSIS — F419 Anxiety disorder, unspecified: Secondary | ICD-10-CM | POA: Diagnosis not present

## 2019-03-27 DIAGNOSIS — F3289 Other specified depressive episodes: Secondary | ICD-10-CM | POA: Diagnosis not present

## 2019-04-10 ENCOUNTER — Other Ambulatory Visit: Payer: Self-pay | Admitting: Family Medicine

## 2019-04-10 DIAGNOSIS — F3289 Other specified depressive episodes: Secondary | ICD-10-CM | POA: Diagnosis not present

## 2019-04-10 DIAGNOSIS — F419 Anxiety disorder, unspecified: Secondary | ICD-10-CM | POA: Diagnosis not present

## 2019-04-10 NOTE — Telephone Encounter (Signed)
Is this okay refill?

## 2019-04-13 ENCOUNTER — Other Ambulatory Visit: Payer: Self-pay | Admitting: Family Medicine

## 2019-04-13 DIAGNOSIS — R945 Abnormal results of liver function studies: Secondary | ICD-10-CM | POA: Diagnosis not present

## 2019-04-13 DIAGNOSIS — E119 Type 2 diabetes mellitus without complications: Secondary | ICD-10-CM | POA: Diagnosis not present

## 2019-04-13 DIAGNOSIS — R197 Diarrhea, unspecified: Secondary | ICD-10-CM | POA: Diagnosis not present

## 2019-04-13 DIAGNOSIS — Z1231 Encounter for screening mammogram for malignant neoplasm of breast: Secondary | ICD-10-CM

## 2019-04-13 DIAGNOSIS — Z8371 Family history of colonic polyps: Secondary | ICD-10-CM | POA: Diagnosis not present

## 2019-04-15 ENCOUNTER — Other Ambulatory Visit: Payer: Self-pay

## 2019-04-15 ENCOUNTER — Ambulatory Visit (HOSPITAL_COMMUNITY)
Admission: EM | Admit: 2019-04-15 | Discharge: 2019-04-15 | Disposition: A | Discharge status: Home / Self Care | Payer: BC Managed Care – PPO | Attending: Family Medicine | Admitting: Family Medicine

## 2019-04-15 ENCOUNTER — Encounter (HOSPITAL_COMMUNITY): Payer: Self-pay | Admitting: *Deleted

## 2019-04-15 DIAGNOSIS — H60332 Swimmer's ear, left ear: Secondary | ICD-10-CM

## 2019-04-15 MED ORDER — NEOMYCIN-POLYMYXIN-HC 3.5-10000-1 OT SUSP: 3.0000 [drp] | Freq: Three times a day (TID) | OTIC | 0 refills | Status: DC

## 2019-04-15 NOTE — Discharge Instructions (Signed)
Please use Polytrim 3 drops every 8 hours into left ear  Use anti-inflammatories for pain/swelling. You may take up to 800 mg Ibuprofen every 8 hours with food. You may supplement Ibuprofen with Tylenol [phone] mg every 8 hours.   Follow up if symptoms not resolving or worsening

## 2019-04-15 NOTE — ED Triage Notes (Signed)
C/O left earache x 2 days with HA and pain radiating down into left jaw.  Denies any fever, cough, or other sxs.

## 2019-04-15 NOTE — ED Provider Notes (Signed)
[facility]    CSN: [id number] Arrival date & time: 04/15/19  1001      History   Chief Complaint Chief Complaint  Patient presents with  . Otalgia    HPI Susan Gould is a 53 y.o. female history of hypertension, hyperlipidemia, GERD, OSA, obesity presenting today for evaluation of left ear pain.  Patient states that approximately 3 days ago her ear was itchy related to her allergies and she stuck something similar to a Bobby pin into her ear to scratch.  She believes she scratched too hard as over the subsequent days she has developed increased pain and muffled hearing out of this ear.  She denies any drainage.  Denies symptoms on right.  Denies associated fevers chills body aches.  Denies associated cough congestion or sore throat.  HPI  Past Medical History:  Diagnosis Date  . Allergic rhinitis, cause unspecified    cat/dog/ragweed/mold/wool, s/p immunotherapy x 3-4 yrs  . Anxiety 2006  . Asthma    (Dr. Joya Gaskins)  . Colon polyps 2007   Dr. Oletta Lamas (she was told repeat in 10 years)  . Depression 2006  . Endometriosis    Dr. Leo Grosser  . Family history of breast cancer in female   . GERD (gastroesophageal reflux disease)   . Hemorrhoid    internal and external  . Hyperlipidemia   . Hypertension   . Insulin resistance    Dr. Leo Grosser  . Medial meniscus tear    Left. Surgery [name].  NOT seen on MRI 06/2013  . Obesity   . OSA (obstructive sleep apnea)    on CPAP (Dr. Gwenette Greet)    Patient Active Problem List   Diagnosis Date Noted  . Acute exacerbation of moderate persistent extrinsic asthma 06/02/2016  . Class 3 obesity due to excess calories with serious comorbidity and body mass index (BMI) of 45.0 to 49.9 in adult 04/23/2016  . Pre-operative clearance 09/23/2015  . Mixed hyperlipidemia 07/29/2011  . OA (osteoarthritis) of knee 07/29/2011  . Morbid obesity with body mass index of 45.0-49.9 in adult [facility]) 03/17/2010  . Insulin resistance syndrome  11/13/2009  . HYPERLIPIDEMIA 11/13/2009  . Anxiety state 11/13/2009  . Essential hypertension 11/13/2009  . Moderate persistent asthma in adult without complication 09/38/1829  . GERD 11/13/2009  . ENDOMETRIOSIS 11/13/2009  . OSA (obstructive sleep apnea) 11/13/2009    Past Surgical History:  Procedure Laterality Date  . CHOLECYSTECTOMY  1986  . FINGER GANGLION CYST EXCISION  2003   RIGHT 3rd finger  . KNEE SURGERY  '95 and '97   BILATERAL KNEE  . LAPAROSCOPY  '98, '01   endometriosis (Dr. Leo Grosser)  . LASIK  2000  . tibial tuberoplasty  90's   left    OB History    Gravida  0   Para  0   Term  0   Preterm  0   AB  0   Living  0     SAB  0   TAB  0   Ectopic  0   Multiple  0   Live Births               Home Medications    Prior to Admission medications   Medication Sig Start Date End Date Taking? Authorizing Provider  atorvastatin (LIPITOR) 80 MG tablet Take 1 tablet (80 mg total) by mouth daily. 12/15/18  Yes Rita Ohara, MD  Calcium Carbonate-Vitamin D (CALCIUM + D) 600-200 MG-UNIT TABS Take 2 tablets  by mouth daily.     Yes [provider]  Desvenlafaxine Succinate (PRISTIQ PO) Take 100 mg by mouth daily.   Yes [provider]  diclofenac (VOLTAREN) 75 MG EC tablet TAKE 1 TABLET(75 MG) BY MOUTH TWICE DAILY 03/22/19  Yes Rita Ohara, MD  Ferrous Sulfate (IRON) 325 (65 Fe) MG TABS Take 1 tablet by mouth daily.   Yes [provider]  flintstones complete (FLINTSTONES) 60 MG chewable tablet Chew 1 tablet by mouth daily.   Yes [provider]  fluticasone (FLONASE) 50 MCG/ACT nasal spray INSTILL 1 TO 2 SPRAYS INTO EACH NOSTRIL ONCE A DAY 03/07/13  Yes Elsie Stain, MD  Fluticasone Furoate (ARNUITY ELLIPTA) 100 MCG/ACT AEPB Inhale 1 puff into the lungs daily. 01/23/19  Yes Brand Males, MD  gabapentin (NEURONTIN) 300 MG capsule TAKE 1 CAPSULE BY MOUTH TWICE DAILY 04/10/19  Yes Rita Ohara, MD  hydrochlorothiazide  (MICROZIDE) 12.5 MG capsule Take 1 capsule (12.5 mg total) by mouth daily. 03/14/19  Yes Rita Ohara, MD  loratadine (CLARITIN) 10 MG tablet Take 10 mg by mouth daily.   Yes [provider]  losartan (COZAAR) 25 MG tablet TAKE 1 TABLET(25 MG) BY MOUTH DAILY 03/22/19  Yes Rita Ohara, MD  metFORMIN (GLUCOPHAGE-XR) 500 MG 24 hr tablet TAKE 2 TABLETS BY MOUTH EVERY MORNING AND 1 TABLET EVERY EVENING 12/15/18  Yes Rita Ohara, MD  Multiple Vitamins-Minerals (MULTIVITAMIN WITH MINERALS) tablet Take 1 tablet by mouth daily.   Yes [provider]  Omega-3 Fatty Acids (FISH OIL) 1200 MG CAPS Take 3 capsules by mouth daily.   Yes [provider]  omeprazole (PRILOSEC) 20 MG capsule Take 20 mg by mouth daily.     Yes [provider]  vitamin B-12 (CYANOCOBALAMIN) 1000 MCG tablet Take 1,000 mcg by mouth daily.     Yes [provider]  vitamin C (ASCORBIC ACID) 500 MG tablet Take 500 mg by mouth daily.   Yes [provider]  ALPRAZolam (XANAX) 0.5 MG tablet TAKE 1/2 -1 TABLET BY MOUTH 3 TIMES DAILY AS NEEDED FOR ANXIETY 12/15/18   Rita Ohara, MD  neomycin-polymyxin-hydrocortisone (CORTISPORIN) 3.5-10000-1 OTIC suspension Place 3 drops into the left ear 3 (three) times daily. 04/15/19   Aarushi Hemric C, PA-C  ondansetron (ZOFRAN) 4 MG tablet Take 4 mg by mouth every 8 (eight) hours as needed for nausea or vomiting.    [provider]  vortioxetine HBr (TRINTELLIX) 20 MG TABS tablet Take 20 mg by mouth daily.    [provider]    Family History Family History  Problem Relation Age of Onset  . Diabetes Mother   . Hypertension Mother   . Hyperlipidemia Mother   . Arthritis Mother   . Cancer Mother 78       breast cancer  . Breast cancer Mother 109  . Diabetes Father   . Hyperlipidemia Father   . Hypertension Father   . Asthma Father   . Arthritis Father   . Heart disease Father        CHF, hypokinesis  . COPD Father        due to  asbestos exposure; former smoker  . Pulmonary embolism Father   . Parkinson's disease Father   . Hypertension Sister   . Colon cancer Maternal Grandmother        late 27's  . Cancer Maternal Grandmother        colon  . Heart disease Maternal Grandmother  MI in 92's  . Breast cancer Paternal Aunt        2's  . Cancer Paternal Aunt        breast  . Heart disease Paternal Uncle   . Diabetes Paternal Uncle   . Kidney disease Paternal Uncle     Social History Social History   Tobacco Use  . Smoking status: Never Smoker  . Smokeless tobacco: Never Used  Substance Use Topics  . Alcohol use: Yes    Comment: occasional  . Drug use: No     Allergies   Meloxicam   Review of Systems Review of Systems  Constitutional: Negative for activity change, appetite change, chills, fatigue and fever.  HENT: Positive for ear pain. Negative for congestion, rhinorrhea, sinus pressure, sore throat and trouble swallowing.   Eyes: Negative for discharge and redness.  Respiratory: Negative for cough, chest tightness and shortness of breath.   Cardiovascular: Negative for chest pain.  Gastrointestinal: Negative for abdominal pain, diarrhea, nausea and vomiting.  Musculoskeletal: Negative for myalgias.  Skin: Negative for rash.  Neurological: Negative for dizziness, light-headedness and headaches.     Physical Exam Triage Vital Signs ED Triage Vitals  Enc Vitals Group     BP 04/15/19 1023 (!) 160/80     Pulse Rate 04/15/19 1022 81     Resp 04/15/19 1022 18     Temp 04/15/19 1022 99.7 F (37.6 C)     Temp Source 04/15/19 1022 Other     SpO2 04/15/19 1022 97 %     Weight --      Height --      Head Circumference --      Peak Flow --      Pain Score 04/15/19 1024 3     Pain Loc --      Pain Edu? --      Excl. in [place]? --    No data found.  Updated Vital Signs BP (!) 160/80 Comment: Has taken HTN med today; states PCP aware BP has been elevated lately  Pulse 81   Temp 99.7  F (37.6 C) (Other (Comment))   Resp 18   LMP 02/09/2009 (Exact Date)   SpO2 97%   Visual Acuity Right Eye Distance:   Left Eye Distance:   Bilateral Distance:    Right Eye Near:   Left Eye Near:    Bilateral Near:     Physical Exam Vitals signs and nursing note reviewed.  Constitutional:      General: She is not in acute distress.    Appearance: She is well-developed.  HENT:     Head: Normocephalic and atraumatic.     Ears:     Comments: Left ear: Nontender to palpation of external auricle, tender to palpation over tragus, left EAC edematous and swollen with pustular drainage noted within canal, TM was partially visualized and appears gray, good cone of light, no erythema noted    Mouth/Throat:     Comments: Oral mucosa pink and moist, no tonsillar enlargement or exudate. Posterior pharynx patent and nonerythematous, no uvula deviation or swelling. Normal phonation.  Eyes:     Conjunctiva/sclera: Conjunctivae normal.  Neck:     Musculoskeletal: Neck supple.     Comments: No lymphadenopathy palpated Cardiovascular:     Rate and Rhythm: Normal rate and regular rhythm.     Heart sounds: No murmur.  Pulmonary:     Effort: Pulmonary effort is normal. No respiratory distress.     Breath sounds:  Normal breath sounds.  Abdominal:     Palpations: Abdomen is soft.     Tenderness: There is no abdominal tenderness.  Skin:    General: Skin is warm and dry.  Neurological:     Mental Status: She is alert.      UC Treatments / Results  Labs (all labs ordered are listed, but only abnormal results are displayed) Labs Reviewed - No data to display  EKG   Radiology No results found.  Procedures Procedures (including critical care time)  Medications Ordered in UC Medications - No data to display  Initial Impression / Assessment and Plan / UC Course  I have reviewed the triage vital signs and the nursing notes.  Pertinent labs & imaging results that were available  during my care of the patient were reviewed by me and considered in my medical decision making (see chart for details).     Patient appears to have otitis externa, likely secondary to abrasion caused by scratching in ear.  No signs of otitis media at this time.  Does have low-grade fever.  Will provide Polytrim drops to use over the next week.  Tylenol and ibuprofen for pain.  Discussed elevated blood pressure with patient, she plans to follow-up with primary care.  She is monitoring at home.  Discussed strict return precautions. Patient verbalized understanding and is agreeable with plan.  Final Clinical Impressions(s) / UC Diagnoses   Final diagnoses:  Acute swimmer's ear of left side     Discharge Instructions     Please use Polytrim 3 drops every 8 hours into left ear  Use anti-inflammatories for pain/swelling. You may take up to 800 mg Ibuprofen every 8 hours with food. You may supplement Ibuprofen with Tylenol [phone] mg every 8 hours.   Follow up if symptoms not resolving or worsening   ED Prescriptions    Medication Sig Dispense Auth. Provider   neomycin-polymyxin-hydrocortisone (CORTISPORIN) 3.5-10000-1 OTIC suspension Place 3 drops into the left ear 3 (three) times daily. 10 mL Joyelle Siedlecki, [place] C, PA-C     PDMP not reviewed this encounter.   Orian Figueira, [place] C, PA-C 04/15/19 1046

## 2019-04-17 DIAGNOSIS — F3289 Other specified depressive episodes: Secondary | ICD-10-CM | POA: Diagnosis not present

## 2019-04-17 DIAGNOSIS — F419 Anxiety disorder, unspecified: Secondary | ICD-10-CM | POA: Diagnosis not present

## 2019-04-19 ENCOUNTER — Encounter: Payer: Self-pay | Admitting: Family Medicine

## 2019-04-19 DIAGNOSIS — Z5181 Encounter for therapeutic drug level monitoring: Secondary | ICD-10-CM

## 2019-04-19 DIAGNOSIS — I10 Essential (primary) hypertension: Secondary | ICD-10-CM

## 2019-04-19 MED ORDER — LOSARTAN POTASSIUM 50 MG PO TABS: ORAL_TABLET | ORAL | 0 refills | Status: DC

## 2019-04-19 NOTE — Telephone Encounter (Signed)
Please contact pt to set up lab/nurse visit.  She needs her BP monitor checked to see if accurate, and needs b-met, which has been ordered. Pt advised through [place].

## 2019-04-21 DIAGNOSIS — F332 Major depressive disorder, recurrent severe without psychotic features: Secondary | ICD-10-CM | POA: Diagnosis not present

## 2019-04-24 DIAGNOSIS — F419 Anxiety disorder, unspecified: Secondary | ICD-10-CM | POA: Diagnosis not present

## 2019-04-24 DIAGNOSIS — F3289 Other specified depressive episodes: Secondary | ICD-10-CM | POA: Diagnosis not present

## 2019-04-26 ENCOUNTER — Other Ambulatory Visit: Payer: Self-pay | Admitting: *Deleted

## 2019-04-27 ENCOUNTER — Other Ambulatory Visit: Payer: Self-pay | Admitting: *Deleted

## 2019-04-27 DIAGNOSIS — R945 Abnormal results of liver function studies: Secondary | ICD-10-CM

## 2019-04-27 DIAGNOSIS — R7989 Other specified abnormal findings of blood chemistry: Secondary | ICD-10-CM

## 2019-04-28 ENCOUNTER — Encounter: Payer: Self-pay | Admitting: Internal Medicine

## 2019-04-28 ENCOUNTER — Other Ambulatory Visit: Payer: Self-pay

## 2019-04-28 ENCOUNTER — Ambulatory Visit: Payer: BLUE CROSS/BLUE SHIELD | Admitting: Internal Medicine

## 2019-04-28 VITALS — BP 132/82 | HR 83 | Temp 97.7°F | Ht 64.0 in | Wt 268.0 lb

## 2019-04-28 DIAGNOSIS — G4733 Obstructive sleep apnea (adult) (pediatric): Secondary | ICD-10-CM

## 2019-04-28 DIAGNOSIS — Z6841 Body Mass Index (BMI) 40.0 and over, adult: Secondary | ICD-10-CM

## 2019-04-28 NOTE — Progress Notes (Signed)
HPI female never smoker followed for OSA, complicated by obesity, GERD, HBP Followed by Dr. Chase Caller for asthma NPSG 04/23/06- AHI 42/ hr, desat to 89% ---------------------------------------------------------------------------   04/20/2018- 53 year old female never smoker followed for OSA, complicated by obesity, GERD, HBP Followed by Dr. Chase Caller for asthma CPAP auto 5-20/ [place] supplies on line. Arnuity Download 100% compliance AHI 1.5/hour.  She does not sleep without CPAP and feels that it improves her sleep quality significantly.  Machine is about 86 or 55 years old.  She had questions about CPAP cleaning devices.  04/28/2019- 53 year old female never smoker followed for OSA, complicated by obesity, GERD, HBP Followed by Dr. Chase Caller for moderate persistent asthma, on Arnuity CPAP auto 5-15/ [place] supplies on line. Body weight today 268 lbs Download compliance 100%, AHI 2.2/ hr -----OSA on CPAP, DME: [facility]; pt reports recent pressure decrease ( to 5-15) d/t bloating - been much better since "Loves" her CPAP which is now 55 years old.  Asthma doing well. Has old rescue inhaler of her fathers's she never needs to use.  Covid discussion- continues careful.   ROS-see HPI   + = positive Constitutional:    weight loss, night sweats, fevers, chills, fatigue, lassitude. HEENT:    headaches, difficulty swallowing, tooth/dental problems, sore throat,       sneezing, itching, ear ache, nasal congestion, post nasal drip, snoring CV:    chest pain, orthopnea, PND, swelling in lower extremities, anasarca,                                                       dizziness, palpitations Resp:   shortness of breath with exertion or at rest.                productive cough,   non-productive cough, coughing up of blood.              change in color of mucus.  wheezing.   Skin:    rash or lesions. GI:  No-   heartburn, indigestion, abdominal pain, nausea, vomiting, diarrhea,          change in bowel habits, loss of appetite GU: dysuria, change in color of urine, no urgency or frequency.   flank pain. MS:   joint pain, stiffness, decreased range of motion, back pain. Neuro-     nothing unusual Psych:  change in mood or affect.  depression or anxiety.   memory loss.  OBJ- Physical Exam General- Alert, Oriented, Affect-appropriate, Distress- none acute + obese Skin- rash-none, lesions- none, excoriation- none Lymphadenopathy- none Head- atraumatic            Eyes- Gross vision intact, PERRLA, conjunctivae and secretions clear            Ears- Hearing, canals-normal            Nose- Clear, no-Septal dev, mucus, polyps, erosion, perforation             Throat- Mallampati III-IV , mucosa clear , drainage- none, tonsils- atrophic Neck- flexible , trachea midline, no stridor , thyroid nl, carotid no bruit Chest - symmetrical excursion , unlabored           Heart/CV- RRR , no murmur , no gallop  , no rub, nl s1 s2                           -  JVD- none , edema- none, stasis changes- none, varices- none           Lung- clear to P&A, wheeze- none, cough- none , dullness-none, rub- none           Chest wall-  Abd-  Br/ Gen/ Rectal- Not done, not indicated Extrem- cyanosis- none, clubbing, none, atrophy- none, strength- nl Neuro- grossly intact to observation

## 2019-04-28 NOTE — Assessment & Plan Note (Signed)
Serious effort at long term weight loss recommended.

## 2019-04-28 NOTE — Patient Instructions (Signed)
Order- DME [facility]- please replace old CPAP machine, auto 5-15, mask of choice, humidifier, supplies, AirView/ card  Please call if we can help

## 2019-04-28 NOTE — Assessment & Plan Note (Signed)
Continues to benefit from CPAP, confirmed by download. Plan- replace old machine if eligible, auto 5-15, mask of choice, supplies, AirView/ card

## 2019-05-01 DIAGNOSIS — F419 Anxiety disorder, unspecified: Secondary | ICD-10-CM | POA: Diagnosis not present

## 2019-05-01 DIAGNOSIS — F3289 Other specified depressive episodes: Secondary | ICD-10-CM | POA: Diagnosis not present

## 2019-05-03 ENCOUNTER — Other Ambulatory Visit: Payer: BC Managed Care – PPO

## 2019-05-03 ENCOUNTER — Other Ambulatory Visit: Payer: Self-pay

## 2019-05-03 DIAGNOSIS — R760 Raised antibody titer: Secondary | ICD-10-CM | POA: Diagnosis not present

## 2019-05-03 DIAGNOSIS — I10 Essential (primary) hypertension: Secondary | ICD-10-CM

## 2019-05-03 DIAGNOSIS — R7989 Other specified abnormal findings of blood chemistry: Secondary | ICD-10-CM

## 2019-05-03 DIAGNOSIS — Z5181 Encounter for therapeutic drug level monitoring: Secondary | ICD-10-CM

## 2019-05-03 DIAGNOSIS — R945 Abnormal results of liver function studies: Secondary | ICD-10-CM | POA: Diagnosis not present

## 2019-05-04 ENCOUNTER — Encounter: Payer: Self-pay | Admitting: *Deleted

## 2019-05-04 LAB — BASIC METABOLIC PANEL
BUN/Creatinine Ratio: 15 (ref 9–23)
BUN: 15 mg/dL (ref 6–24)
CO2: 22 mmol/L (ref 20–29)
Calcium: 9.9 mg/dL (ref 8.7–10.2)
Chloride: 103 mmol/L (ref 96–106)
Creatinine, Ser: 1.01 mg/dL — ABNORMAL HIGH (ref 0.57–1.00)
GFR calc Af Amer: 73 mL/min/{1.73_m2} (ref >59)
GFR calc non Af Amer: 63 mL/min/{1.73_m2} (ref >59)
Glucose: 92 mg/dL (ref 65–99)
Potassium: 4.2 mmol/L (ref 3.5–5.2)
Sodium: 143 mmol/L (ref 134–144)

## 2019-05-04 LAB — IRON,TIBC AND FERRITIN PANEL
Ferritin: 42 ng/mL (ref 15–150)
Iron Saturation: 19 % (ref 15–55)
Iron: 60 ug/dL (ref 27–159)
Total Iron Binding Capacity: 316 ug/dL (ref 250–450)
UIBC: 256 ug/dL (ref 131–425)

## 2019-05-04 LAB — MITOCHONDRIAL/SMOOTH MUSCLE AB PNL
Mitochondrial Ab: <20 Units (ref 0.0–20.0)
Smooth Muscle Ab: 41 Units — ABNORMAL HIGH (ref 0–19)

## 2019-05-04 LAB — ANA: Anti Nuclear Antibody (ANA): POSITIVE — AB

## 2019-05-04 LAB — HEPATITIS B SURFACE ANTIGEN: Hepatitis B Surface Ag: NEGATIVE

## 2019-05-04 LAB — CERULOPLASMIN: Ceruloplasmin: 23.4 mg/dL (ref 19.0–39.0)

## 2019-05-08 DIAGNOSIS — F3289 Other specified depressive episodes: Secondary | ICD-10-CM | POA: Diagnosis not present

## 2019-05-08 DIAGNOSIS — F419 Anxiety disorder, unspecified: Secondary | ICD-10-CM | POA: Diagnosis not present

## 2019-05-10 LAB — ANA COMPREHENSIVE PLUS PROFILE
Anti JO-1: <0.2 AI (ref 0.0–0.9)
Antiribosomal P Antibodies: <0.2 AI (ref 0.0–0.9)
Centromere Ab Screen: <0.2 AI (ref 0.0–0.9)
Chromatin Ab SerPl-aCnc: <0.2 AI (ref 0.0–0.9)
ENA RNP Ab: 0.9 AI (ref 0.0–0.9)
ENA SM Ab Ser-aCnc: <0.2 AI (ref 0.0–0.9)
ENA SSA (RO) Ab: <0.2 AI (ref 0.0–0.9)
ENA SSB (LA) Ab: <0.2 AI (ref 0.0–0.9)
Scleroderma (Scl-70) (ENA) Antibody, IgG: 0.2 AI (ref 0.0–0.9)
Smith/RNP Antibodies: <0.2 AI (ref 0.0–0.9)
dsDNA Ab: <1 IU/mL (ref 0–9)

## 2019-05-10 LAB — SPECIMEN STATUS REPORT

## 2019-05-15 DIAGNOSIS — F3289 Other specified depressive episodes: Secondary | ICD-10-CM | POA: Diagnosis not present

## 2019-05-15 DIAGNOSIS — F419 Anxiety disorder, unspecified: Secondary | ICD-10-CM | POA: Diagnosis not present

## 2019-05-22 DIAGNOSIS — F3289 Other specified depressive episodes: Secondary | ICD-10-CM | POA: Diagnosis not present

## 2019-05-22 DIAGNOSIS — F419 Anxiety disorder, unspecified: Secondary | ICD-10-CM | POA: Diagnosis not present

## 2019-05-29 DIAGNOSIS — F419 Anxiety disorder, unspecified: Secondary | ICD-10-CM | POA: Diagnosis not present

## 2019-05-29 DIAGNOSIS — F3289 Other specified depressive episodes: Secondary | ICD-10-CM | POA: Diagnosis not present

## 2019-05-31 ENCOUNTER — Ambulatory Visit
Admission: RE | Admit: 2019-05-31 | Discharge: 2019-05-31 | Disposition: A | Discharge status: Home / Self Care | Payer: BC Managed Care – PPO | Source: Ambulatory Visit | Attending: Family Medicine | Admitting: Family Medicine

## 2019-05-31 ENCOUNTER — Other Ambulatory Visit: Payer: Self-pay

## 2019-05-31 DIAGNOSIS — Z1231 Encounter for screening mammogram for malignant neoplasm of breast: Secondary | ICD-10-CM | POA: Diagnosis not present

## 2019-06-05 DIAGNOSIS — F3289 Other specified depressive episodes: Secondary | ICD-10-CM | POA: Diagnosis not present

## 2019-06-05 DIAGNOSIS — F419 Anxiety disorder, unspecified: Secondary | ICD-10-CM | POA: Diagnosis not present

## 2019-06-07 DIAGNOSIS — Z01419 Encounter for gynecological examination (general) (routine) without abnormal findings: Secondary | ICD-10-CM | POA: Diagnosis not present

## 2019-06-07 DIAGNOSIS — N898 Other specified noninflammatory disorders of vagina: Secondary | ICD-10-CM | POA: Diagnosis not present

## 2019-06-12 DIAGNOSIS — R748 Abnormal levels of other serum enzymes: Secondary | ICD-10-CM | POA: Diagnosis not present

## 2019-06-12 DIAGNOSIS — F419 Anxiety disorder, unspecified: Secondary | ICD-10-CM | POA: Diagnosis not present

## 2019-06-12 DIAGNOSIS — F3289 Other specified depressive episodes: Secondary | ICD-10-CM | POA: Diagnosis not present

## 2019-06-19 DIAGNOSIS — F419 Anxiety disorder, unspecified: Secondary | ICD-10-CM | POA: Diagnosis not present

## 2019-06-19 DIAGNOSIS — F3289 Other specified depressive episodes: Secondary | ICD-10-CM | POA: Diagnosis not present

## 2019-06-20 DIAGNOSIS — R748 Abnormal levels of other serum enzymes: Secondary | ICD-10-CM | POA: Diagnosis not present

## 2019-06-21 DIAGNOSIS — F331 Major depressive disorder, recurrent, moderate: Secondary | ICD-10-CM | POA: Diagnosis not present

## 2019-06-25 ENCOUNTER — Other Ambulatory Visit: Payer: Self-pay | Admitting: Family Medicine

## 2019-06-25 DIAGNOSIS — E8881 Metabolic syndrome: Secondary | ICD-10-CM

## 2019-06-26 DIAGNOSIS — F3289 Other specified depressive episodes: Secondary | ICD-10-CM | POA: Diagnosis not present

## 2019-06-26 DIAGNOSIS — F419 Anxiety disorder, unspecified: Secondary | ICD-10-CM | POA: Diagnosis not present

## 2019-06-27 ENCOUNTER — Other Ambulatory Visit (HOSPITAL_COMMUNITY): Payer: Self-pay | Admitting: Gastroenterology

## 2019-06-27 DIAGNOSIS — R748 Abnormal levels of other serum enzymes: Secondary | ICD-10-CM

## 2019-06-27 DIAGNOSIS — R7989 Other specified abnormal findings of blood chemistry: Secondary | ICD-10-CM

## 2019-07-03 DIAGNOSIS — F3289 Other specified depressive episodes: Secondary | ICD-10-CM | POA: Diagnosis not present

## 2019-07-03 DIAGNOSIS — F419 Anxiety disorder, unspecified: Secondary | ICD-10-CM | POA: Diagnosis not present

## 2019-07-06 ENCOUNTER — Other Ambulatory Visit: Payer: Self-pay | Admitting: Radiology

## 2019-07-10 ENCOUNTER — Other Ambulatory Visit: Payer: Self-pay | Admitting: Radiology

## 2019-07-10 ENCOUNTER — Ambulatory Visit (HOSPITAL_COMMUNITY): Payer: BC Managed Care – PPO

## 2019-07-10 DIAGNOSIS — F419 Anxiety disorder, unspecified: Secondary | ICD-10-CM | POA: Diagnosis not present

## 2019-07-10 DIAGNOSIS — F3289 Other specified depressive episodes: Secondary | ICD-10-CM | POA: Diagnosis not present

## 2019-07-11 ENCOUNTER — Other Ambulatory Visit: Payer: Self-pay

## 2019-07-11 ENCOUNTER — Ambulatory Visit (HOSPITAL_COMMUNITY)
Admission: RE | Admit: 2019-07-11 | Discharge: 2019-07-11 | Disposition: A | Discharge status: Home / Self Care | Payer: BC Managed Care – PPO | Source: Ambulatory Visit | Attending: Gastroenterology | Admitting: Gastroenterology

## 2019-07-11 DIAGNOSIS — R748 Abnormal levels of other serum enzymes: Secondary | ICD-10-CM | POA: Insufficient documentation

## 2019-07-11 DIAGNOSIS — R7989 Other specified abnormal findings of blood chemistry: Secondary | ICD-10-CM | POA: Insufficient documentation

## 2019-07-11 DIAGNOSIS — K7581 Nonalcoholic steatohepatitis (NASH): Secondary | ICD-10-CM | POA: Diagnosis not present

## 2019-07-11 LAB — CBC
HCT: 36.6 % (ref 36.0–46.0)
Hemoglobin: 12 g/dL (ref 12.0–15.0)
MCH: 30.2 pg (ref 26.0–34.0)
MCHC: 32.8 g/dL (ref 30.0–36.0)
MCV: 92.2 fL (ref 80.0–100.0)
Platelets: 262 10*3/uL (ref 150–400)
RBC: 3.97 MIL/uL (ref 3.87–5.11)
RDW: 13 % (ref 11.5–15.5)
WBC: 7.8 10*3/uL (ref 4.0–10.5)
nRBC: 0 % (ref 0.0–0.2)

## 2019-07-11 LAB — PROTIME-INR
INR: 1 (ref 0.8–1.2)
Prothrombin Time: 12.6 seconds (ref 11.4–15.2)

## 2019-07-11 LAB — GLUCOSE, CAPILLARY: Glucose-Capillary: 94 mg/dL (ref 70–99)

## 2019-07-11 MED ORDER — FENTANYL CITRATE (PF) 100 MCG/2ML IJ SOLN
INTRAMUSCULAR | Status: AC
  Filled 2019-07-11: qty 2

## 2019-07-11 MED ORDER — GELATIN ABSORBABLE 12-7 MM EX MISC
CUTANEOUS | Status: AC
  Filled 2019-07-11: qty 1

## 2019-07-11 MED ORDER — SODIUM CHLORIDE 0.9 % IV SOLN: INTRAVENOUS | Status: DC

## 2019-07-11 MED ORDER — FENTANYL CITRATE (PF) 100 MCG/2ML IJ SOLN
INTRAMUSCULAR | Status: DC | PRN
  Administered 2019-07-11 (×2): 50 ug via INTRAVENOUS

## 2019-07-11 MED ORDER — SODIUM CHLORIDE 0.9 % IV SOLN
INTRAVENOUS | Status: DC | PRN
  Administered 2019-07-11: 10 mL/h via INTRAVENOUS

## 2019-07-11 MED ORDER — MIDAZOLAM HCL 2 MG/2ML IJ SOLN
INTRAMUSCULAR | Status: AC
  Filled 2019-07-11: qty 2

## 2019-07-11 MED ORDER — LIDOCAINE-EPINEPHRINE 1 %-1:100000 IJ SOLN
INTRAMUSCULAR | Status: AC
  Filled 2019-07-11: qty 1

## 2019-07-11 MED ORDER — MIDAZOLAM HCL 2 MG/2ML IJ SOLN
INTRAMUSCULAR | Status: DC | PRN
  Administered 2019-07-11 (×2): 1 mg via INTRAVENOUS

## 2019-07-11 NOTE — H&P (Signed)
Chief Complaint: Patient was seen in consultation today for liver biopsy.  Referring Physician(s): Arta Silence  Supervising Physician: Sandi Mariscal  Patient Status: [facility] - Out-pt  History of Present Illness: Susan Gould is a 54 y.o. female being worked up for elevated LFTs and fatty liver disease. She is referred for image guided random liver core biopsy. PMHx, meds, labs, imaging, allergies reviewed. Feels well, no recent fevers, chills, illness. Has been NPO today as directed.   Past Medical History:  Diagnosis Date  . Allergic rhinitis, cause unspecified    cat/dog/ragweed/mold/wool, s/p immunotherapy x 3-4 yrs  . Anxiety 2006  . Asthma    (Dr. Joya Gaskins)  . Colon polyps 2007   Dr. Oletta Lamas (she was told repeat in 10 years)  . Depression 2006  . Endometriosis    Dr. Leo Grosser  . Family history of breast cancer in female   . GERD (gastroesophageal reflux disease)   . Hemorrhoid    internal and external  . Hyperlipidemia   . Hypertension   . Insulin resistance    Dr. Leo Grosser  . Medial meniscus tear    Left. Surgery [name].  NOT seen on MRI 06/2013  . Obesity   . OSA (obstructive sleep apnea)    on CPAP (Dr. Gwenette Greet)    Past Surgical History:  Procedure Laterality Date  . CHOLECYSTECTOMY  1986  . FINGER GANGLION CYST EXCISION  2003   RIGHT 3rd finger  . KNEE SURGERY  '95 and '97   BILATERAL KNEE  . LAPAROSCOPY  '98, '01   endometriosis (Dr. Leo Grosser)  . LASIK  2000  . tibial tuberoplasty  90's   left    Allergies: Meloxicam  Medications: Prior to Admission medications   Medication Sig Start Date End Date Taking? Authorizing Provider  ALPRAZolam (XANAX) 0.5 MG tablet TAKE 1/2 -1 TABLET BY MOUTH 3 TIMES DAILY AS NEEDED FOR ANXIETY 12/15/18  Yes Rita Ohara, MD  atorvastatin (LIPITOR) 80 MG tablet Take 1 tablet (80 mg total) by mouth daily. 12/15/18  Yes Rita Ohara, MD  Calcium Carbonate-Vitamin D (CALCIUM + D) 600-200 MG-UNIT TABS Take 2 tablets  by mouth daily.     Yes [provider]  Desvenlafaxine Succinate (PRISTIQ PO) Take 100 mg by mouth daily.   Yes [provider]  diclofenac (VOLTAREN) 75 MG EC tablet TAKE 1 TABLET(75 MG) BY MOUTH TWICE DAILY 03/22/19  Yes Rita Ohara, MD  Ferrous Sulfate (IRON) 325 (65 Fe) MG TABS Take 1 tablet by mouth daily.   Yes [provider]  flintstones complete (FLINTSTONES) 60 MG chewable tablet Chew 1 tablet by mouth daily.   Yes [provider]  fluticasone (FLONASE) 50 MCG/ACT nasal spray INSTILL 1 TO 2 SPRAYS INTO EACH NOSTRIL ONCE A DAY 03/07/13  Yes Elsie Stain, MD  Fluticasone Furoate (ARNUITY ELLIPTA) 100 MCG/ACT AEPB Inhale 1 puff into the lungs daily. 01/23/19  Yes Brand Males, MD  gabapentin (NEURONTIN) 300 MG capsule TAKE 1 CAPSULE BY MOUTH TWICE DAILY 04/10/19  Yes Rita Ohara, MD  hydrochlorothiazide (MICROZIDE) 12.5 MG capsule Take 1 capsule (12.5 mg total) by mouth daily. 03/14/19  Yes Rita Ohara, MD  levothyroxine (SYNTHROID) 25 MCG tablet Take 25 mcg by mouth daily before breakfast.   Yes [provider]  lithium carbonate 300 MG capsule Take 300 mg by mouth daily.   Yes [provider]  loratadine (CLARITIN) 10 MG tablet Take 10 mg by mouth daily.   Yes [provider]  losartan (COZAAR) 50 MG tablet TAKE 1 TABLET(25 MG) BY MOUTH DAILY 04/19/19  Yes Rita Ohara, MD  metFORMIN (GLUCOPHAGE-XR) 500 MG 24 hr tablet TAKE 2 TABLETS BY MOUTH EVERY MORNING AND 1 TABLET EVERY EVENING 06/26/19  Yes Rita Ohara, MD  Multiple Vitamins-Minerals (MULTIVITAMIN WITH MINERALS) tablet Take 1 tablet by mouth daily.   Yes [provider]  Omega-3 Fatty Acids (FISH OIL) 1200 MG CAPS Take 3 capsules by mouth daily.   Yes [provider]  omeprazole (PRILOSEC) 20 MG capsule Take 20 mg by mouth daily.     Yes [provider]  ondansetron (ZOFRAN) 4 MG tablet Take 4 mg by mouth every 8 (eight) hours as needed for  nausea or vomiting.   Yes [provider]  vitamin B-12 (CYANOCOBALAMIN) 1000 MCG tablet Take 1,000 mcg by mouth daily.     Yes [provider]  vitamin C (ASCORBIC ACID) 500 MG tablet Take 500 mg by mouth daily.   Yes [provider]     Family History  Problem Relation Age of Onset  . Diabetes Mother   . Hypertension Mother   . Hyperlipidemia Mother   . Arthritis Mother   . Cancer Mother 2       breast cancer  . Breast cancer Mother 35  . Diabetes Father   . Hyperlipidemia Father   . Hypertension Father   . Asthma Father   . Arthritis Father   . Heart disease Father        CHF, hypokinesis  . COPD Father        due to asbestos exposure; former smoker  . Pulmonary embolism Father   . Parkinson's disease Father   . Hypertension Sister   . Colon cancer Maternal Grandmother        late 47's  . Cancer Maternal Grandmother        colon  . Heart disease Maternal Grandmother        MI in 73's  . Breast cancer Paternal Aunt        79's  . Cancer Paternal Aunt        breast  . Heart disease Paternal Uncle   . Diabetes Paternal Uncle   . Kidney disease Paternal Uncle     Social History   Socioeconomic History  . Marital status: Single    Spouse name: Not on file  . Number of children: 0  . Years of education: Not on file  . Highest education level: Not on file  Occupational History  . Occupation: Medical Transcription    Employer: [employer]  Tobacco Use  . Smoking status: Never Smoker  . Smokeless tobacco: Never Used  Substance and Sexual Activity  . Alcohol use: Yes    Comment: occasional  . Drug use: No  . Sexual activity: Not on file  Other Topics Concern  . Not on file  Social History Narrative   Lives alone, 1 cat (1 passed away 04/23/2019 at the age of 41).  Sister and father live in [place] Determinants of Health   Financial Resource Strain:   . Difficulty of Paying Living Expenses: Not on file  Food  Insecurity:   . Worried About Charity fundraiser in the Last Year: Not on file  . Ran Out of Food in the Last Year: Not on file  Transportation Needs:   . Lack of Transportation (Medical): Not on file  . Lack of Transportation (Non-Medical): Not on file  Physical Activity:   . Days of Exercise per Week: Not on file  . Minutes of Exercise per Session: Not on file  Stress:   . Feeling of Stress : Not on file  Social Connections:   . Frequency of Communication with Friends and Family: Not on file  . Frequency of Social Gatherings with Friends and Family: Not on file  . Attends Religious Services: Not on file  . Active Member of Clubs or Organizations: Not on file  . Attends Archivist Meetings: Not on file  . Marital Status: Not on file    Review of Systems: A 12 point ROS discussed and pertinent positives are indicated in the HPI above.  All other systems are negative.  Review of Systems  Vital Signs: BP 140/81   Pulse 80   Temp 97.9 F (36.6 C) (Temporal)   Ht 5' 4"  (1.626 m)   Wt 117.9 kg   LMP 02/09/2009 (Exact Date)   SpO2 99%   BMI 44.63 kg/m   Physical Exam Constitutional:      Appearance: Normal appearance. She is obese.  HENT:     Head: Normocephalic.     Mouth/Throat:     Mouth: Mucous membranes are moist.     Pharynx: Oropharynx is clear.  Cardiovascular:     Rate and Rhythm: Normal rate and regular rhythm.     Heart sounds: Normal heart sounds.  Pulmonary:     Effort: Pulmonary effort is normal. No respiratory distress.     Breath sounds: Normal breath sounds.  Abdominal:     General: Abdomen is flat. There is no distension.     Palpations: Abdomen is soft.     Tenderness: There is no abdominal tenderness.  Skin:    General: Skin is warm and dry.     Coloration: Skin is not jaundiced.  Neurological:     General: No focal deficit present.     Mental Status: She is alert and oriented to person, place, and time.  Psychiatric:        Mood  and Affect: Mood normal.        Thought Content: Thought content normal.        Judgment: Judgment normal.       Imaging: No results found.  Labs:  CBC: Recent Labs    12/06/18 0834 07/11/19 0624  WBC 6.3 7.8  HGB 11.8 12.0  HCT 36.1 36.6  PLT 253 262    COAGS: Recent Labs    07/11/19 0624  INR 1.0    BMP: Recent Labs    12/06/18 0834 05/03/19 0831  NA 142 143  K 4.1 4.2  CL 102 103  CO2 24 22  GLUCOSE 94 92  BUN 13 15  CALCIUM 9.8 9.9  CREATININE 1.08* 1.01*  GFRNONAA 58* 63  GFRAA 67 73    LIVER FUNCTION TESTS: Recent Labs    12/06/18 0834  BILITOT 0.3  AST 63*  ALT 97*  ALKPHOS 92  PROT 7.0  ALBUMIN 4.5    TUMOR MARKERS: No results for input(s): AFPTM, CEA, CA199, CHROMGRNA in the last 8760 hours.  Assessment and Plan: Elevated LFTs For image guided random liver core biopsy Labs pending Risks and benefits of liver biopsy was discussed with the patient and/or patient's family including, but not limited to bleeding, infection, damage to adjacent structures or low yield requiring additional tests.  All of the questions were answered and there is agreement to proceed.  Consent signed and  in chart.    Thank you for this interesting consult.  I greatly enjoyed meeting Susan Gould and look forward to participating in their care.  A copy of this report was sent to the requesting provider on this date.  Electronically Signed: Ascencion Dike, PA-C 07/11/2019, 7:28 AM   I spent a total of 20 minutes in face to face in clinical consultation, greater than 50% of which was counseling/coordinating care for liver biopsy

## 2019-07-11 NOTE — Procedures (Signed)
Pre Procedure Dx: Elevated LFTs Post Procedural Dx: Same  Technically successful US guided biopsy of right lobe of the liver.  EBL: None  No immediate complications.   Jay Romani Wilbon, MD Pager #: [phone]    

## 2019-07-11 NOTE — Discharge Instructions (Signed)
Liver Biopsy, Care After These instructions give you information on caring for yourself after your procedure. Your doctor may also give you more specific instructions. Call your doctor if you have any problems or questions after your procedure. What can I expect after the procedure? After the procedure, it is common to have:  Pain and soreness where the biopsy was done.  Bruising around the area where the biopsy was done.  Sleepiness and be tired for a few days. Follow these instructions at home: Medicines  Take over-the-counter and prescription medicines only as told by your doctor.  If you were prescribed an antibiotic medicine, take it as told by your doctor. Do not stop taking the antibiotic even if you start to feel better.  Do not take medicines such as aspirin and ibuprofen. These medicines can thin your blood. Do not take these medicines unless your doctor tells you to take them.  If you are taking prescription pain medicine, take actions to prevent or treat constipation. Your doctor may recommend that you: ? Drink enough fluid to keep your pee (urine) clear or pale yellow. ? Take over-the-counter or prescription medicines. ? Eat foods that are high in fiber, such as fresh fruits and vegetables, whole grains, and beans. ? Limit foods that are high in fat and processed sugars, such as fried and sweet foods. Caring for your cut  Follow instructions from your doctor about how to take care of your cuts from surgery (incisions). Make sure you: ? Wash your hands with soap and water before you change your bandage (dressing). If you cannot use soap and water, use hand sanitizer. ? Change your bandage as told by your doctor. ? Leave stitches (sutures), skin glue, or skin tape (adhesive) strips in place. They may need to stay in place for 2 weeks or longer. If tape strips get loose and curl up, you may trim the loose edges. Do not remove tape strips completely unless your doctor says it is  okay.  Check your cuts every day for signs of infection. Check for: ? Redness, swelling, or more pain. ? Fluid or blood. ? Pus or a bad smell. ? Warmth.  Do not take baths, swim, or use a hot tub until your doctor says it is okay to do so. Activity   Rest at home for 1-2 days or as told by your doctor. ? Avoid sitting for a long time without moving. Get up to take short walks every 1-2 hours.  Return to your normal activities as told by your doctor. Ask what activities are safe for you.  Do not do these things in the first 24 hours: ? Drive. ? Use machinery. ? Take a bath or shower.  Do not lift more than 10 pounds (4.5 kg) or play contact sports for the first 2 weeks. General instructions   Do not drink alcohol in the first week after the procedure.  Have someone stay with you for at least 24 hours after the procedure.  Get your test results. Ask your doctor or the department that is doing the test: ? When will my results be ready? ? How will I get my results? ? What are my treatment options? ? What other tests do I need? ? What are my next steps?  Keep all follow-up visits as told by your doctor. This is important. Contact a doctor if:  A cut bleeds and leaves more than just a small spot of blood.  A cut is red, puffs up (  swells), or hurts more than before.  Fluid or something else comes from a cut.  A cut smells bad.  You have a fever or chills. Get help right away if:  You have swelling, bloating, or pain in your belly (abdomen).  You get dizzy or faint.  You have a rash.  You feel sick to your stomach (nauseous) or throw up (vomit).  You have trouble breathing, feel short of breath, or feel faint.  Your chest hurts.  You have problems talking or seeing.  You have trouble with your balance or moving your arms or legs. Summary  After the procedure, it is common to have pain, soreness, bruising, and tiredness.  Your doctor will tell you how to  take care of yourself at home. Change your bandage, take your medicines, and limit your activities as told by your doctor.  Call your doctor if you have symptoms of infection. Get help right away if your belly swells, your cut bleeds a lot, or you have trouble talking or breathing. This information is not intended to replace advice given to you by your health care provider. Make sure you discuss any questions you have with your health care provider. Document Revised: 06/25/2017 Document Reviewed: 06/25/2017 Elsevier Patient Education  2020 [employer].

## 2019-07-12 ENCOUNTER — Other Ambulatory Visit: Payer: Self-pay | Admitting: Physician Assistant

## 2019-07-13 ENCOUNTER — Encounter: Payer: Self-pay | Admitting: Family Medicine

## 2019-07-13 LAB — SURGICAL PATHOLOGY

## 2019-07-14 ENCOUNTER — Other Ambulatory Visit: Payer: Self-pay | Admitting: Family Medicine

## 2019-07-14 ENCOUNTER — Encounter: Payer: Self-pay | Admitting: Family Medicine

## 2019-07-14 DIAGNOSIS — R635 Abnormal weight gain: Secondary | ICD-10-CM

## 2019-07-14 DIAGNOSIS — Z5181 Encounter for therapeutic drug level monitoring: Secondary | ICD-10-CM

## 2019-07-14 DIAGNOSIS — F339 Major depressive disorder, recurrent, unspecified: Secondary | ICD-10-CM

## 2019-07-17 DIAGNOSIS — F3289 Other specified depressive episodes: Secondary | ICD-10-CM | POA: Diagnosis not present

## 2019-07-17 DIAGNOSIS — F419 Anxiety disorder, unspecified: Secondary | ICD-10-CM | POA: Diagnosis not present

## 2019-07-20 ENCOUNTER — Other Ambulatory Visit: Payer: Self-pay

## 2019-07-20 ENCOUNTER — Other Ambulatory Visit: Payer: BC Managed Care – PPO

## 2019-07-20 DIAGNOSIS — R635 Abnormal weight gain: Secondary | ICD-10-CM | POA: Diagnosis not present

## 2019-07-20 DIAGNOSIS — E782 Mixed hyperlipidemia: Secondary | ICD-10-CM

## 2019-07-20 DIAGNOSIS — Z5181 Encounter for therapeutic drug level monitoring: Secondary | ICD-10-CM

## 2019-07-20 DIAGNOSIS — F339 Major depressive disorder, recurrent, unspecified: Secondary | ICD-10-CM

## 2019-07-20 DIAGNOSIS — K760 Fatty (change of) liver, not elsewhere classified: Secondary | ICD-10-CM

## 2019-07-20 DIAGNOSIS — I10 Essential (primary) hypertension: Secondary | ICD-10-CM | POA: Diagnosis not present

## 2019-07-20 DIAGNOSIS — E8881 Metabolic syndrome: Secondary | ICD-10-CM

## 2019-07-21 LAB — HEMOGLOBIN A1C
Est. average glucose Bld gHb Est-mCnc: 114 mg/dL
Hgb A1c MFr Bld: 5.6 % (ref 4.8–5.6)

## 2019-07-21 LAB — COMPREHENSIVE METABOLIC PANEL
ALT: 79 IU/L — ABNORMAL HIGH (ref 0–32)
AST: 58 IU/L — ABNORMAL HIGH (ref 0–40)
Albumin/Globulin Ratio: 1.7 (ref 1.2–2.2)
Albumin: 4.8 g/dL (ref 3.8–4.9)
Alkaline Phosphatase: 96 IU/L (ref 39–117)
BUN/Creatinine Ratio: 16 (ref 9–23)
BUN: 17 mg/dL (ref 6–24)
Bilirubin Total: 0.3 mg/dL (ref 0.0–1.2)
CO2: 22 mmol/L (ref 20–29)
Calcium: 10.2 mg/dL (ref 8.7–10.2)
Chloride: 101 mmol/L (ref 96–106)
Creatinine, Ser: 1.05 mg/dL — ABNORMAL HIGH (ref 0.57–1.00)
GFR calc Af Amer: 69 mL/min/{1.73_m2} (ref >59)
GFR calc non Af Amer: 60 mL/min/{1.73_m2} (ref >59)
Globulin, Total: 2.9 g/dL (ref 1.5–4.5)
Glucose: 92 mg/dL (ref 65–99)
Potassium: 4.4 mmol/L (ref 3.5–5.2)
Sodium: 141 mmol/L (ref 134–144)
Total Protein: 7.7 g/dL (ref 6.0–8.5)

## 2019-07-21 LAB — TSH: TSH: 1.47 u[IU]/mL (ref 0.450–4.500)

## 2019-07-21 LAB — LIPID PANEL
Chol/HDL Ratio: 3.6 ratio (ref 0.0–4.4)
Cholesterol, Total: 146 mg/dL (ref 100–199)
HDL: 41 mg/dL (ref >39)
LDL Chol Calc (NIH): 61 mg/dL (ref 0–99)
Triglycerides: 282 mg/dL — ABNORMAL HIGH (ref 0–149)
VLDL Cholesterol Cal: 44 mg/dL — ABNORMAL HIGH (ref 5–40)

## 2019-07-24 ENCOUNTER — Other Ambulatory Visit: Payer: Self-pay | Admitting: Family Medicine

## 2019-07-24 DIAGNOSIS — E8881 Metabolic syndrome: Secondary | ICD-10-CM

## 2019-07-24 DIAGNOSIS — F419 Anxiety disorder, unspecified: Secondary | ICD-10-CM | POA: Diagnosis not present

## 2019-07-24 DIAGNOSIS — F3289 Other specified depressive episodes: Secondary | ICD-10-CM | POA: Diagnosis not present

## 2019-07-25 DIAGNOSIS — K7581 Nonalcoholic steatohepatitis (NASH): Secondary | ICD-10-CM | POA: Insufficient documentation

## 2019-07-25 NOTE — Progress Notes (Signed)
Start time: 11:17 End time: 11:53  Virtual Visit via Video Note  I connected with Susan Gould on 07/26/2019 by a video enabled telemedicine application and verified that I am speaking with the correct person using two identifiers.  Location: Patient: home, alone Provider: office   I discussed the limitations of evaluation and management by telemedicine and the availability of in person appointments. The patient expressed understanding and agreed to proceed.  History of Present Illness:  Chief Complaint  Patient presents with  . Hyperlipidemia    VIRTUAL med check. No new concerns.     Patient presents for follow-up on chronic problems.  She had labs done prior to her visit.  Hypertension:compliant with taking12.63m of HCTZ and losartan 578m BP's were better after starting losartan, always 130/80, so stopped checking as regularly.  Admits to not being as careful with her salt intake and exercise. Denies side effects, headaches, dizziness, chest pain palpitations, edema.  BP Readings from Last 3 Encounters:  07/11/19 112/68  04/28/19 132/82  04/15/19 (!) 160/80   Hyperlipidemia follow-up: Patient is reportedly following a low cholesterol diet. Compliant withatorvastatin8025mand is currently taking fish oil 3000m102mily, tolerating without side effects.  She continues to do intermittent fasting.  She cut out all sugar starting in December, and trying to eliminate flour (which she has struggled with). No longer snacking.  Eats 2 meals in 6 hours. Eliminated red meat, doing more ground [place] chicken. +tortilla chips. Admits she could eat more vegetables.  Sister took her on as a client (cerMuseum/gallery exhibitions officerncouraging her to set her protocols/intentions, including for meals the day prior. She has not been getting any regular exercise.  PCOS/insulin resistance/suspected DM: Compliant with metformin.Denies polydipsia, polyuria.  Fatty liver--LFT's have  remained elevated. See below for recent labs.  She had f/u with Dr. EdwaOletta Lamasd underwent liver biopsy on 07/11/19.  Path results showed: LIVER, NEEDLE CORE BIOPSY:  - Mildly active steatohepatitis (grade 1 of 3; if determined to be  NAFLD, then the NAS = 4 of 8).  - No pathologic fibrosis   COMMENT:  The biopsy is adequate for review and consists of several cores of liver with preserved architecture. There is moderate micro- and macrovesicular steatosis (approximately 40%) with focal ballooning degeneration. Hepatic lobules show rare foci of necroinflammation. Numerous glycogenated nuclei are identified, suggestive of insulin resistance. Portal tracts show minimal to mild mixed inflammation composed of lymphocytes, few plasma cells and scattered eosinophils. There is no significant interface hepatitis. Bile ducts are unremarkable. Trichrome and reticulin stains do not show evidence of pathologic fibrosis. Iron stain shows no abnormal iron accumulation. PASD stain shows few ceroid-laden macrophages within sinusoids and portal tracts but does not show any globular inclusions in hepatocytes. The findings are that of mildly active steatohepatitis, alcoholic and/or non-alcoholic, without fibrosis. Patient's positive ANA test is noted but histologic features of autoimmune hepatitis are not present. Overlapping mild drug/toxin-induced liver injury may need to be considered.   Depression/anxiety: She is now under the care of Dr. KaurToy Careeds were changed to Trintellix in 01/2019 (She has been having a lot of nausea, being treated with zofran, needing about 3x/week). It didn't seem to help much, so she was then changed to PrisDougherty11/2020.  Nausea is much better. Dr. KaurToy Caren added Lithium and levothyroxine 25mc19m 05/2019.  She has f/u next week.   She continues to see Dr. OchenBrent BulladelLimmie Patricia CarolArcadiasees her weekly.  It is  helpful, and consistent with what her sister  has been telling her.  She reports that Madelina does CBT.   Obesity:Weight was 249# 9.6 oz at her 05/2018 visit.  She gained to 268# 0.6 oz by her March 2020 visit, and had stayed at that weight (or higher) through her last visit. Not currently getting any regular exercise. Wt Readings from Last 3 Encounters:  07/11/19 260 lb (117.9 kg)  04/28/19 268 lb (121.6 kg)  03/22/19 272 lb 12.8 oz (123.7 kg)   Chronic back pain, with bilateral sciatica. She is under the care of Dr. Ron Agee. This has been stable, managing with Tylenol in the morning, diclofenac once daily in the evening, 358m BID of gabapentin.  Nerve pain improved after radiofrequency ablation (09/2018), though still has muscular pain and some sciatica.   Numbness in feet remains improved.  She hasn't needed to f/u with Dr. IRon Agee stable overall.  Asthma:  Doing well on Arnuity. Needed albuterol just once, when she walked outside and had allergy flare.  Only needed once in the last year.  OSA:  Compliant with CPAP. Denies feeling unrefreshed, and is less sleepy during the day since on CPAP.  She last saw pulmonary in July.   PMH, PSH, SH reviewed  Outpatient Encounter Medications as of 07/26/2019  Medication Sig Note  . ALPRAZolam (XANAX) 0.5 MG tablet TAKE 1/2 -1 TABLET BY MOUTH 3 TIMES DAILY AS NEEDED FOR ANXIETY   . atorvastatin (LIPITOR) 80 MG tablet Take 1 tablet (80 mg total) by mouth daily.   . Calcium Carbonate-Vitamin D (CALCIUM + D) 600-200 MG-UNIT TABS Take 2 tablets by mouth daily.     .Marland KitchenDesvenlafaxine Succinate (PRISTIQ PO) Take 100 mg by mouth daily.   . diclofenac (VOLTAREN) 75 MG EC tablet TAKE 1 TABLET(75 MG) BY MOUTH TWICE DAILY 07/26/2019: Just taking once daily (evening)  . Ferrous Sulfate (IRON) 325 (65 Fe) MG TABS Take 1 tablet by mouth daily.   . flintstones complete (FLINTSTONES) 60 MG chewable tablet Chew 1 tablet by mouth daily.   . fluticasone (FLONASE) 50 MCG/ACT nasal spray INSTILL 1 TO 2 SPRAYS  INTO EACH NOSTRIL ONCE A DAY   . Fluticasone Furoate (ARNUITY ELLIPTA) 100 MCG/ACT AEPB Inhale 1 puff into the lungs daily.   .Marland Kitchengabapentin (NEURONTIN) 300 MG capsule TAKE 1 CAPSULE BY MOUTH TWICE DAILY   . hydrochlorothiazide (MICROZIDE) 12.5 MG capsule Take 1 capsule (12.5 mg total) by mouth daily.   .Marland Kitchenlevothyroxine (SYNTHROID) 25 MCG tablet Take 25 mcg by mouth daily before breakfast.   . lithium carbonate 300 MG capsule Take 300 mg by mouth daily.   .Marland Kitchenloratadine (CLARITIN) 10 MG tablet Take 10 mg by mouth daily.   .Marland Kitchenlosartan (COZAAR) 50 MG tablet TAKE 1 TABLET(25 MG) BY MOUTH DAILY (Patient taking differently: Take 50 mg by mouth daily. TAKE 1 TABLET(25 MG) BY MOUTH DAILY)   . metFORMIN (GLUCOPHAGE-XR) 500 MG 24 hr tablet TAKE 2 TABLETS BY MOUTH EVERY MORNING AND 1 TABLET EVERY EVENING   . Multiple Vitamins-Minerals (MULTIVITAMIN WITH MINERALS) tablet Take 1 tablet by mouth daily.   . Omega-3 Fatty Acids (FISH OIL) 1200 MG CAPS Take 3 capsules by mouth daily.   .Marland Kitchenomeprazole (PRILOSEC) 20 MG capsule Take 20 mg by mouth daily.     . vitamin B-12 (CYANOCOBALAMIN) 1000 MCG tablet Take 1,000 mcg by mouth daily.     . vitamin C (ASCORBIC ACID) 500 MG tablet Take 500 mg by mouth daily.   .Marland Kitchen  ondansetron (ZOFRAN) 4 MG tablet Take 4 mg by mouth every 8 (eight) hours as needed for nausea or vomiting.   . [DISCONTINUED] metFORMIN (GLUCOPHAGE-XR) 500 MG 24 hr tablet TAKE 2 TABLETS BY MOUTH EVERY MORNING AND 1 TABLET EVERY EVENING    No facility-administered encounter medications on file as of 07/26/2019.   ROS:  No fever, chills, headaches, dizziness, chest pain, shortness of breath.  No abdominal pain. Nausea resolve.  Breathing is at baseline, doing well. Moods--slightly better, but concerned that the medications aren't working like they should be.     Observations/Objective:  BP (!) 143/76   Ht 5' 4"  (1.626 m)   Wt 257 lb 12.8 oz (116.9 kg)   LMP 02/09/2009 (Exact Date)   BMI 44.25 kg/m     Weight was on home scale  Wt Readings from Last 3 Encounters:  07/11/19 260 lb (117.9 kg)  04/28/19 268 lb (121.6 kg)  03/22/19 272 lb 12.8 oz (123.7 kg)   Pleasant, well-appearing female, in good spirits. She is alert, oriented, cranial nerves grossly intact. Normal eye contact, speech, grooming. Exam is limited due to virtual nature of the visit  Lab Results  Component Value Date   HGBA1C 5.6 07/20/2019   Fasting glucose 92    Chemistry      Component Value Date/Time   NA 141 07/20/2019 0830   K 4.4 07/20/2019 0830   CL 101 07/20/2019 0830   CO2 22 07/20/2019 0830   BUN 17 07/20/2019 0830   CREATININE 1.05 (H) 07/20/2019 0830   CREATININE 0.97 03/09/2017 1110      Component Value Date/Time   CALCIUM 10.2 07/20/2019 0830   ALKPHOS 96 07/20/2019 0830   AST 58 (H) 07/20/2019 0830   ALT 79 (H) 07/20/2019 0830   BILITOT 0.3 07/20/2019 0830     Lab Results  Component Value Date   CHOL 146 07/20/2019   HDL 41 07/20/2019   LDLCALC 61 07/20/2019   TRIG 282 (H) 07/20/2019   CHOLHDL 3.6 07/20/2019   Lab Results  Component Value Date   TSH 1.470 07/20/2019   Had recent CBC (related to biopsy): Lab Results  Component Value Date   WBC 7.8 07/11/2019   HGB 12.0 07/11/2019   HCT 36.6 07/11/2019   MCV 92.2 07/11/2019   PLT 262 07/11/2019     Assessment and Plan:   Mixed hyperlipidemia - TG remains elevated, LDL at goal.  Reviewed diet, increase fish oil to 4051m daily. Consider fenofibrate (low dose) if remains elevated  Class 3 severe obesity due to excess calories with serious comorbidity and body mass index (BMI) of 40.0 to 44.9 in adult ([facility] - counseled re: diet and weight loss, risks of obesity  Insulin resistance syndrome - continue metformin 15062mdaily  Essential hypertension - BP elevated.  Cut back on sodium, exercise daily, monitor BP more frequently, f/u sooner if remains >135/85  Steatohepatitis - no fibrosis seen on recent biopsy. Weight  loss and lowering TG encouraged.  Cut back on salt in your diet. Get regular exercise (goal is 150 minutes of cardio each week). Monitor BP regularly.  Hopefully you will see these come back down to the 130/80 (or lower) range.  If it is persistently over 135/85, please follow-up sooner than 6 months so we can adjust your medications. Increase fish oil to 4/d and work on diet (limiting sweets, fried/greasy foods), to get the triglycerides down.  We may need to start low dose fenofibrate if stays >250 Include  exercise in your intention, starting at 15 minutes/day and working your way up, or doing short intervals more than once in a day.   6 months. Fasting labs prior--A1c, lipids, c-met Not ordering thyroid, as Dr. Toy Care should be monitoring since she is prescribing lithium and levothyroxine (and likely will have checked levels again prior to then, may not be due).    Follow Up Instructions:    I discussed the assessment and treatment plan with the patient. The patient was provided an opportunity to ask questions and all were answered. The patient agreed with the plan and demonstrated an understanding of the instructions.   The patient was advised to call back or seek an in-person evaluation if the symptoms worsen or if the condition fails to improve as anticipated.  I provided 36 minutes of video face-to-face time during this encounter.  More than 1/2 spent counseling. Spent 10 minutes of chart review (other providers, lab results) and documentation.    Vikki Ports, MD

## 2019-07-26 ENCOUNTER — Ambulatory Visit (INDEPENDENT_AMBULATORY_CARE_PROVIDER_SITE_OTHER): Payer: BC Managed Care – PPO | Admitting: Family Medicine

## 2019-07-26 ENCOUNTER — Other Ambulatory Visit: Payer: Self-pay

## 2019-07-26 ENCOUNTER — Encounter: Payer: Self-pay | Admitting: Family Medicine

## 2019-07-26 VITALS — BP 143/76 | Ht 64.0 in | Wt 257.8 lb

## 2019-07-26 DIAGNOSIS — E66813 Obesity, class 3: Secondary | ICD-10-CM

## 2019-07-26 DIAGNOSIS — E8881 Metabolic syndrome: Secondary | ICD-10-CM

## 2019-07-26 DIAGNOSIS — E782 Mixed hyperlipidemia: Secondary | ICD-10-CM

## 2019-07-26 DIAGNOSIS — I10 Essential (primary) hypertension: Secondary | ICD-10-CM

## 2019-07-26 DIAGNOSIS — Z6841 Body Mass Index (BMI) 40.0 and over, adult: Secondary | ICD-10-CM

## 2019-07-26 DIAGNOSIS — K7581 Nonalcoholic steatohepatitis (NASH): Secondary | ICD-10-CM

## 2019-07-26 DIAGNOSIS — Z5181 Encounter for therapeutic drug level monitoring: Secondary | ICD-10-CM

## 2019-07-26 NOTE — Patient Instructions (Signed)
Cut back on salt in your diet. Get regular exercise (goal is 150 minutes of cardio each week). Monitor BP regularly.  Hopefully you will see these come back down to the 130/80 (or lower) range.  If it is persistently over 135/85, please follow-up sooner than 6 months so we can adjust your medications. Increase fish oil to 4/d and work on diet (limiting sweets, fried/greasy foods), to get the triglycerides down.  We may need to start low dose fenofibrate if stays >250 Include exercise in your intention, starting at 15 minutes/day and working your way up, or doing short intervals more than once in a day.   DASH Eating Plan DASH stands for "Dietary Approaches to Stop Hypertension." The DASH eating plan is a healthy eating plan that has been shown to reduce high blood pressure (hypertension). It may also reduce your risk for type 2 diabetes, heart disease, and stroke. The DASH eating plan may also help with weight loss. What are tips for following this plan?  General guidelines  Avoid eating more than 2,300 mg (milligrams) of salt (sodium) a day. If you have hypertension, you may need to reduce your sodium intake to 1,500 mg a day.  Limit alcohol intake to no more than 1 drink a day for nonpregnant women and 2 drinks a day for men. One drink equals 12 oz of beer, 5 oz of wine, or 1 oz of hard liquor.  Work with your health care provider to maintain a healthy body weight or to lose weight. Ask what an ideal weight is for you.  Get at least 30 minutes of exercise that causes your heart to beat faster (aerobic exercise) most days of the week. Activities may include walking, swimming, or biking.  Work with your health care provider or diet and nutrition specialist (dietitian) to adjust your eating plan to your individual calorie needs. Reading food labels   Check food labels for the amount of sodium per serving. Choose foods with less than 5 percent of the Daily Value of sodium. Generally, foods  with less than 300 mg of sodium per serving fit into this eating plan.  To find whole grains, look for the word "whole" as the first word in the ingredient list. Shopping  Buy products labeled as "low-sodium" or "no salt added."  Buy fresh foods. Avoid canned foods and premade or frozen meals. Cooking  Avoid adding salt when cooking. Use salt-free seasonings or herbs instead of table salt or sea salt. Check with your health care provider or pharmacist before using salt substitutes.  Do not fry foods. Cook foods using healthy methods such as baking, boiling, grilling, and broiling instead.  Cook with heart-healthy oils, such as olive, canola, soybean, or sunflower oil. Meal planning  Eat a balanced diet that includes: ? 5 or more servings of fruits and vegetables each day. At each meal, try to fill half of your plate with fruits and vegetables. ? Up to 6-8 servings of whole grains each day. ? Less than 6 oz of lean meat, poultry, or fish each day. A 3-oz serving of meat is about the same size as a deck of cards. One egg equals 1 oz. ? 2 servings of low-fat dairy each day. ? A serving of nuts, seeds, or beans 5 times each week. ? Heart-healthy fats. Healthy fats called Omega-3 fatty acids are found in foods such as flaxseeds and coldwater fish, like sardines, salmon, and mackerel.  Limit how much you eat of the following: ?  Canned or prepackaged foods. ? Food that is high in trans fat, such as fried foods. ? Food that is high in saturated fat, such as fatty meat. ? Sweets, desserts, sugary drinks, and other foods with added sugar. ? Full-fat dairy products.  Do not salt foods before eating.  Try to eat at least 2 vegetarian meals each week.  Eat more home-cooked food and less restaurant, buffet, and fast food.  When eating at a restaurant, ask that your food be prepared with less salt or no salt, if possible. What foods are recommended? The items listed may not be a complete  list. Talk with your dietitian about what dietary choices are best for you. Grains Whole-grain or whole-wheat bread. Whole-grain or whole-wheat pasta. Brown rice. [place]. Bulgur. Whole-grain and low-sodium cereals. Pita bread. Low-fat, low-sodium crackers. Whole-wheat flour tortillas. Vegetables Fresh or frozen vegetables (raw, steamed, roasted, or grilled). Low-sodium or reduced-sodium tomato and vegetable juice. Low-sodium or reduced-sodium tomato sauce and tomato paste. Low-sodium or reduced-sodium canned vegetables. Fruits All fresh, dried, or frozen fruit. Canned fruit in natural juice (without added sugar). Meat and other protein foods Skinless chicken or [place]. Ground chicken or [place]. Pork with fat trimmed off. Fish and seafood. Egg whites. Dried beans, peas, or lentils. Unsalted nuts, nut butters, and seeds. Unsalted canned beans. Lean cuts of beef with fat trimmed off. Low-sodium, lean deli meat. Dairy Low-fat (1%) or fat-free (skim) milk. Fat-free, low-fat, or reduced-fat cheeses. Nonfat, low-sodium ricotta or cottage cheese. Low-fat or nonfat yogurt. Low-fat, low-sodium cheese. Fats and oils Soft margarine without trans fats. Vegetable oil. Low-fat, reduced-fat, or light mayonnaise and salad dressings (reduced-sodium). Canola, safflower, olive, soybean, and sunflower oils. Avocado. Seasoning and other foods Herbs. Spices. Seasoning mixes without salt. Unsalted popcorn and pretzels. Fat-free sweets. What foods are not recommended? The items listed may not be a complete list. Talk with your dietitian about what dietary choices are best for you. Grains Baked goods made with fat, such as croissants, muffins, or some breads. Dry pasta or rice meal packs. Vegetables Creamed or fried vegetables. Vegetables in a cheese sauce. Regular canned vegetables (not low-sodium or reduced-sodium). Regular canned tomato sauce and paste (not low-sodium or reduced-sodium). Regular tomato and  vegetable juice (not low-sodium or reduced-sodium). Angie Fava. Olives. Fruits Canned fruit in a light or heavy syrup. Fried fruit. Fruit in cream or butter sauce. Meat and other protein foods Fatty cuts of meat. Ribs. Fried meat. Berniece Salines. Sausage. Bologna and other processed lunch meats. Salami. Fatback. Hotdogs. Bratwurst. Salted nuts and seeds. Canned beans with added salt. Canned or smoked fish. Whole eggs or egg yolks. Chicken or [place] with skin. Dairy Whole or 2% milk, cream, and half-and-half. Whole or full-fat cream cheese. Whole-fat or sweetened yogurt. Full-fat cheese. Nondairy creamers. Whipped toppings. Processed cheese and cheese spreads. Fats and oils Butter. Stick margarine. Lard. Shortening. Ghee. Bacon fat. Tropical oils, such as coconut, palm kernel, or palm oil. Seasoning and other foods Salted popcorn and pretzels. Onion salt, garlic salt, seasoned salt, table salt, and sea salt. Worcestershire sauce. Tartar sauce. Barbecue sauce. Teriyaki sauce. Soy sauce, including reduced-sodium. Steak sauce. Canned and packaged gravies. Fish sauce. Oyster sauce. Cocktail sauce. Horseradish that you find on the shelf. Ketchup. Mustard. Meat flavorings and tenderizers. Bouillon cubes. Hot sauce and Tabasco sauce. Premade or packaged marinades. Premade or packaged taco seasonings. Relishes. Regular salad dressings. Where to find more information:  National Heart, Lung, and [place]: https://wilson-eaton.com/  American Heart Association: www.heart.org Summary  The DASH eating plan is a healthy eating plan that has been shown to reduce high blood pressure (hypertension). It may also reduce your risk for type 2 diabetes, heart disease, and stroke.  With the DASH eating plan, you should limit salt (sodium) intake to 2,300 mg a day. If you have hypertension, you may need to reduce your sodium intake to 1,500 mg a day.  When on the DASH eating plan, aim to eat more fresh fruits and vegetables, whole  grains, lean proteins, low-fat dairy, and heart-healthy fats.  Work with your health care provider or diet and nutrition specialist (dietitian) to adjust your eating plan to your individual calorie needs. This information is not intended to replace advice given to you by your health care provider. Make sure you discuss any questions you have with your health care provider. Document Revised: 05/28/2017 Document Reviewed: 06/08/2016 Elsevier Patient Education  2020 [employer].

## 2019-07-31 ENCOUNTER — Other Ambulatory Visit: Payer: Self-pay | Admitting: Family Medicine

## 2019-07-31 DIAGNOSIS — G8929 Other chronic pain: Secondary | ICD-10-CM

## 2019-07-31 DIAGNOSIS — F3289 Other specified depressive episodes: Secondary | ICD-10-CM | POA: Diagnosis not present

## 2019-07-31 DIAGNOSIS — M545 Low back pain, unspecified: Secondary | ICD-10-CM

## 2019-07-31 DIAGNOSIS — F419 Anxiety disorder, unspecified: Secondary | ICD-10-CM | POA: Diagnosis not present

## 2019-07-31 NOTE — Telephone Encounter (Signed)
Is this okay to refill? 

## 2019-08-03 DIAGNOSIS — F331 Major depressive disorder, recurrent, moderate: Secondary | ICD-10-CM | POA: Diagnosis not present

## 2019-08-07 DIAGNOSIS — F3289 Other specified depressive episodes: Secondary | ICD-10-CM | POA: Diagnosis not present

## 2019-08-07 DIAGNOSIS — F419 Anxiety disorder, unspecified: Secondary | ICD-10-CM | POA: Diagnosis not present

## 2019-08-14 DIAGNOSIS — F419 Anxiety disorder, unspecified: Secondary | ICD-10-CM | POA: Diagnosis not present

## 2019-08-14 DIAGNOSIS — F3289 Other specified depressive episodes: Secondary | ICD-10-CM | POA: Diagnosis not present

## 2019-08-15 NOTE — Progress Notes (Signed)
Done

## 2019-08-23 ENCOUNTER — Other Ambulatory Visit: Payer: Self-pay | Admitting: Family Medicine

## 2019-08-23 DIAGNOSIS — E8881 Metabolic syndrome: Secondary | ICD-10-CM

## 2019-08-23 DIAGNOSIS — I10 Essential (primary) hypertension: Secondary | ICD-10-CM

## 2019-08-28 DIAGNOSIS — F419 Anxiety disorder, unspecified: Secondary | ICD-10-CM | POA: Diagnosis not present

## 2019-08-28 DIAGNOSIS — F3289 Other specified depressive episodes: Secondary | ICD-10-CM | POA: Diagnosis not present

## 2019-09-03 ENCOUNTER — Other Ambulatory Visit: Payer: Self-pay | Admitting: Family Medicine

## 2019-09-03 DIAGNOSIS — I10 Essential (primary) hypertension: Secondary | ICD-10-CM

## 2019-09-04 DIAGNOSIS — F3289 Other specified depressive episodes: Secondary | ICD-10-CM | POA: Diagnosis not present

## 2019-09-04 DIAGNOSIS — F419 Anxiety disorder, unspecified: Secondary | ICD-10-CM | POA: Diagnosis not present

## 2019-09-11 DIAGNOSIS — F3289 Other specified depressive episodes: Secondary | ICD-10-CM | POA: Diagnosis not present

## 2019-09-11 DIAGNOSIS — F419 Anxiety disorder, unspecified: Secondary | ICD-10-CM | POA: Diagnosis not present

## 2019-09-13 DIAGNOSIS — F3341 Major depressive disorder, recurrent, in partial remission: Secondary | ICD-10-CM | POA: Diagnosis not present

## 2019-09-15 ENCOUNTER — Ambulatory Visit: Payer: BC Managed Care – PPO | Attending: Internal Medicine

## 2019-09-15 DIAGNOSIS — Z23 Encounter for immunization: Secondary | ICD-10-CM

## 2019-09-15 NOTE — Progress Notes (Signed)
   Covid-19 Vaccination Clinic  Name:  Susan Gould    MRN: [id number] DOB: 06-Nov-1965  09/15/2019  [name] was observed post Covid-19 immunization for 15 minutes without incident. She was provided with Vaccine Information Sheet and instruction to access the V-Safe system.   [name] was instructed to call 911 with any severe reactions post vaccine: Marland Kitchen Difficulty breathing  . Swelling of face and throat  . A fast heartbeat  . A bad rash all over body  . Dizziness and weakness   Immunizations Administered    Name Date Dose VIS Date Route   Pfizer COVID-19 Vaccine 09/15/2019  1:15 PM 0.3 mL 06/09/2019 Intramuscular   Manufacturer: [employer]   Lot: [id number]   [place]: [id number]

## 2019-09-18 DIAGNOSIS — H43813 Vitreous degeneration, bilateral: Secondary | ICD-10-CM | POA: Diagnosis not present

## 2019-09-18 DIAGNOSIS — F3289 Other specified depressive episodes: Secondary | ICD-10-CM | POA: Diagnosis not present

## 2019-09-18 DIAGNOSIS — E119 Type 2 diabetes mellitus without complications: Secondary | ICD-10-CM | POA: Diagnosis not present

## 2019-09-18 DIAGNOSIS — F419 Anxiety disorder, unspecified: Secondary | ICD-10-CM | POA: Diagnosis not present

## 2019-09-18 LAB — HM DIABETES EYE EXAM

## 2019-09-21 ENCOUNTER — Encounter: Payer: Self-pay | Admitting: *Deleted

## 2019-09-21 DIAGNOSIS — G5603 Carpal tunnel syndrome, bilateral upper limbs: Secondary | ICD-10-CM | POA: Diagnosis not present

## 2019-09-25 DIAGNOSIS — F3289 Other specified depressive episodes: Secondary | ICD-10-CM | POA: Diagnosis not present

## 2019-09-25 DIAGNOSIS — F419 Anxiety disorder, unspecified: Secondary | ICD-10-CM | POA: Diagnosis not present

## 2019-10-02 DIAGNOSIS — F419 Anxiety disorder, unspecified: Secondary | ICD-10-CM | POA: Diagnosis not present

## 2019-10-02 DIAGNOSIS — F3289 Other specified depressive episodes: Secondary | ICD-10-CM | POA: Diagnosis not present

## 2019-10-09 ENCOUNTER — Other Ambulatory Visit: Payer: Self-pay | Admitting: Family Medicine

## 2019-10-09 DIAGNOSIS — F419 Anxiety disorder, unspecified: Secondary | ICD-10-CM | POA: Diagnosis not present

## 2019-10-09 DIAGNOSIS — F3289 Other specified depressive episodes: Secondary | ICD-10-CM | POA: Diagnosis not present

## 2019-10-09 DIAGNOSIS — E782 Mixed hyperlipidemia: Secondary | ICD-10-CM

## 2019-10-10 ENCOUNTER — Ambulatory Visit: Payer: BC Managed Care – PPO | Attending: Internal Medicine

## 2019-10-10 DIAGNOSIS — Z23 Encounter for immunization: Secondary | ICD-10-CM

## 2019-10-10 NOTE — Telephone Encounter (Signed)
[facility] is requesting to fill pt gabapentin. Please advise [facility]

## 2019-10-10 NOTE — Progress Notes (Signed)
   Covid-19 Vaccination Clinic  Name:  Susan Gould    MRN: [id number] DOB: 11-Jan-1966  10/10/2019  [name] was observed post Covid-19 immunization for 15 minutes without incident. She was provided with Vaccine Information Sheet and instruction to access the V-Safe system.   [name] was instructed to call 911 with any severe reactions post vaccine: Marland Kitchen Difficulty breathing  . Swelling of face and throat  . A fast heartbeat  . A bad rash all over body  . Dizziness and weakness   Immunizations Administered    Name Date Dose VIS Date Route   Pfizer COVID-19 Vaccine 10/10/2019  2:11 PM 0.3 mL 06/09/2019 Intramuscular   Manufacturer: [place]   Lot: [id number]   [place]: [id number]

## 2019-10-16 DIAGNOSIS — F3289 Other specified depressive episodes: Secondary | ICD-10-CM | POA: Diagnosis not present

## 2019-10-16 DIAGNOSIS — F419 Anxiety disorder, unspecified: Secondary | ICD-10-CM | POA: Diagnosis not present

## 2019-10-20 ENCOUNTER — Other Ambulatory Visit: Payer: Self-pay | Admitting: Family Medicine

## 2019-10-20 DIAGNOSIS — E8881 Metabolic syndrome: Secondary | ICD-10-CM

## 2019-10-23 DIAGNOSIS — F3289 Other specified depressive episodes: Secondary | ICD-10-CM | POA: Diagnosis not present

## 2019-10-23 DIAGNOSIS — F419 Anxiety disorder, unspecified: Secondary | ICD-10-CM | POA: Diagnosis not present

## 2019-10-27 ENCOUNTER — Encounter: Payer: Self-pay | Admitting: Family Medicine

## 2019-10-27 ENCOUNTER — Telehealth: Payer: Self-pay

## 2019-10-27 MED ORDER — KETOROLAC TROMETHAMINE 15.75 MG/SPRAY NA SOLN: 1.0000 | Freq: Four times a day (QID) | NASAL | 0 refills | Status: DC | PRN

## 2019-10-27 NOTE — Telephone Encounter (Signed)
Please advise pt of this. I was not aware this was no longer available.

## 2019-10-27 NOTE — Telephone Encounter (Signed)
I received a fax form [facility] stating that the pts. Sprix 15.91m nasal spray is no longer made and it would need to be changed to a different prescription. Pt. Last apt was 07/26/19.

## 2019-10-27 NOTE — Telephone Encounter (Signed)
Pt was advised and informed of new script. Lillie

## 2019-10-30 DIAGNOSIS — F419 Anxiety disorder, unspecified: Secondary | ICD-10-CM | POA: Diagnosis not present

## 2019-10-30 DIAGNOSIS — F3289 Other specified depressive episodes: Secondary | ICD-10-CM | POA: Diagnosis not present

## 2019-11-01 ENCOUNTER — Other Ambulatory Visit: Payer: Self-pay | Admitting: Family Medicine

## 2019-11-01 DIAGNOSIS — G8929 Other chronic pain: Secondary | ICD-10-CM

## 2019-11-01 NOTE — Telephone Encounter (Signed)
Is this okay to refill? 

## 2019-11-06 DIAGNOSIS — F3289 Other specified depressive episodes: Secondary | ICD-10-CM | POA: Diagnosis not present

## 2019-11-06 DIAGNOSIS — F419 Anxiety disorder, unspecified: Secondary | ICD-10-CM | POA: Diagnosis not present

## 2019-11-13 DIAGNOSIS — F3289 Other specified depressive episodes: Secondary | ICD-10-CM | POA: Diagnosis not present

## 2019-11-13 DIAGNOSIS — F419 Anxiety disorder, unspecified: Secondary | ICD-10-CM | POA: Diagnosis not present

## 2019-11-15 DIAGNOSIS — M545 Low back pain: Secondary | ICD-10-CM | POA: Diagnosis not present

## 2019-11-15 DIAGNOSIS — M47816 Spondylosis without myelopathy or radiculopathy, lumbar region: Secondary | ICD-10-CM | POA: Diagnosis not present

## 2019-11-20 DIAGNOSIS — K7581 Nonalcoholic steatohepatitis (NASH): Secondary | ICD-10-CM | POA: Diagnosis not present

## 2019-11-20 DIAGNOSIS — G5603 Carpal tunnel syndrome, bilateral upper limbs: Secondary | ICD-10-CM | POA: Diagnosis not present

## 2019-11-20 DIAGNOSIS — R197 Diarrhea, unspecified: Secondary | ICD-10-CM | POA: Diagnosis not present

## 2019-11-21 ENCOUNTER — Other Ambulatory Visit: Payer: Self-pay | Admitting: Family Medicine

## 2019-11-21 DIAGNOSIS — E8881 Metabolic syndrome: Secondary | ICD-10-CM

## 2019-11-21 DIAGNOSIS — F419 Anxiety disorder, unspecified: Secondary | ICD-10-CM | POA: Diagnosis not present

## 2019-11-21 DIAGNOSIS — F3289 Other specified depressive episodes: Secondary | ICD-10-CM | POA: Diagnosis not present

## 2019-11-22 DIAGNOSIS — R197 Diarrhea, unspecified: Secondary | ICD-10-CM | POA: Diagnosis not present

## 2019-11-26 ENCOUNTER — Other Ambulatory Visit: Payer: Self-pay | Admitting: Family Medicine

## 2019-11-26 DIAGNOSIS — E8881 Metabolic syndrome: Secondary | ICD-10-CM

## 2019-11-28 DIAGNOSIS — F3289 Other specified depressive episodes: Secondary | ICD-10-CM | POA: Diagnosis not present

## 2019-11-28 DIAGNOSIS — F419 Anxiety disorder, unspecified: Secondary | ICD-10-CM | POA: Diagnosis not present

## 2019-12-01 DIAGNOSIS — G5603 Carpal tunnel syndrome, bilateral upper limbs: Secondary | ICD-10-CM | POA: Diagnosis not present

## 2019-12-04 DIAGNOSIS — F3289 Other specified depressive episodes: Secondary | ICD-10-CM | POA: Diagnosis not present

## 2019-12-04 DIAGNOSIS — F419 Anxiety disorder, unspecified: Secondary | ICD-10-CM | POA: Diagnosis not present

## 2019-12-06 ENCOUNTER — Encounter: Payer: Self-pay | Admitting: Family Medicine

## 2019-12-09 ENCOUNTER — Other Ambulatory Visit: Payer: Self-pay | Admitting: Family Medicine

## 2019-12-09 DIAGNOSIS — I10 Essential (primary) hypertension: Secondary | ICD-10-CM

## 2019-12-11 DIAGNOSIS — F419 Anxiety disorder, unspecified: Secondary | ICD-10-CM | POA: Diagnosis not present

## 2019-12-11 DIAGNOSIS — F3289 Other specified depressive episodes: Secondary | ICD-10-CM | POA: Diagnosis not present

## 2019-12-11 NOTE — Telephone Encounter (Signed)
I sent it through and we will have to see what happens.  The tablets are 79m, and she would have to cut them in half.  She has been on the capsules for a while.  If they are too expensive, then she needs to be told that we have to switch back to the tablets and she will have to cut them in half.  Let's see what happens.

## 2019-12-11 NOTE — Telephone Encounter (Signed)
I am sending this you because when I went to refill it, it looks like the tablet would be preferred. Not sure if you are okay with that. Thanks.

## 2019-12-17 ENCOUNTER — Encounter: Payer: Self-pay | Admitting: Family Medicine

## 2019-12-18 DIAGNOSIS — F419 Anxiety disorder, unspecified: Secondary | ICD-10-CM | POA: Diagnosis not present

## 2019-12-18 DIAGNOSIS — F3289 Other specified depressive episodes: Secondary | ICD-10-CM | POA: Diagnosis not present

## 2019-12-24 ENCOUNTER — Other Ambulatory Visit: Payer: Self-pay | Admitting: Family Medicine

## 2019-12-24 DIAGNOSIS — E8881 Metabolic syndrome: Secondary | ICD-10-CM

## 2019-12-25 ENCOUNTER — Other Ambulatory Visit: Payer: Self-pay | Admitting: Physical Medicine and Rehabilitation

## 2019-12-25 DIAGNOSIS — M5416 Radiculopathy, lumbar region: Secondary | ICD-10-CM | POA: Diagnosis not present

## 2019-12-25 DIAGNOSIS — F419 Anxiety disorder, unspecified: Secondary | ICD-10-CM | POA: Diagnosis not present

## 2019-12-25 DIAGNOSIS — M545 Low back pain, unspecified: Secondary | ICD-10-CM

## 2019-12-25 DIAGNOSIS — F3289 Other specified depressive episodes: Secondary | ICD-10-CM | POA: Diagnosis not present

## 2019-12-25 DIAGNOSIS — M47816 Spondylosis without myelopathy or radiculopathy, lumbar region: Secondary | ICD-10-CM | POA: Diagnosis not present

## 2019-12-28 DIAGNOSIS — G5601 Carpal tunnel syndrome, right upper limb: Secondary | ICD-10-CM | POA: Diagnosis not present

## 2019-12-28 HISTORY — PX: CARPAL TUNNEL RELEASE: SHX101

## 2020-01-08 DIAGNOSIS — G5601 Carpal tunnel syndrome, right upper limb: Secondary | ICD-10-CM | POA: Diagnosis not present

## 2020-01-09 DIAGNOSIS — F4321 Adjustment disorder with depressed mood: Secondary | ICD-10-CM | POA: Diagnosis not present

## 2020-01-09 DIAGNOSIS — F3342 Major depressive disorder, recurrent, in full remission: Secondary | ICD-10-CM | POA: Diagnosis not present

## 2020-01-10 DIAGNOSIS — F3289 Other specified depressive episodes: Secondary | ICD-10-CM | POA: Diagnosis not present

## 2020-01-10 DIAGNOSIS — F419 Anxiety disorder, unspecified: Secondary | ICD-10-CM | POA: Diagnosis not present

## 2020-01-15 DIAGNOSIS — F3289 Other specified depressive episodes: Secondary | ICD-10-CM | POA: Diagnosis not present

## 2020-01-15 DIAGNOSIS — F419 Anxiety disorder, unspecified: Secondary | ICD-10-CM | POA: Diagnosis not present

## 2020-01-22 ENCOUNTER — Other Ambulatory Visit: Payer: BC Managed Care – PPO

## 2020-01-22 ENCOUNTER — Other Ambulatory Visit: Payer: Self-pay

## 2020-01-22 DIAGNOSIS — F3289 Other specified depressive episodes: Secondary | ICD-10-CM | POA: Diagnosis not present

## 2020-01-22 DIAGNOSIS — F419 Anxiety disorder, unspecified: Secondary | ICD-10-CM | POA: Diagnosis not present

## 2020-01-22 DIAGNOSIS — Z5181 Encounter for therapeutic drug level monitoring: Secondary | ICD-10-CM | POA: Diagnosis not present

## 2020-01-22 DIAGNOSIS — K7581 Nonalcoholic steatohepatitis (NASH): Secondary | ICD-10-CM

## 2020-01-22 DIAGNOSIS — E8881 Metabolic syndrome: Secondary | ICD-10-CM

## 2020-01-22 DIAGNOSIS — E782 Mixed hyperlipidemia: Secondary | ICD-10-CM | POA: Diagnosis not present

## 2020-01-22 DIAGNOSIS — I10 Essential (primary) hypertension: Secondary | ICD-10-CM

## 2020-01-23 LAB — COMPREHENSIVE METABOLIC PANEL
ALT: 58 IU/L — ABNORMAL HIGH (ref 0–32)
AST: 49 IU/L — ABNORMAL HIGH (ref 0–40)
Albumin/Globulin Ratio: 1.9 (ref 1.2–2.2)
Albumin: 4.8 g/dL (ref 3.8–4.9)
Alkaline Phosphatase: 82 IU/L (ref 48–121)
BUN/Creatinine Ratio: 15 (ref 9–23)
BUN: 13 mg/dL (ref 6–24)
Bilirubin Total: 0.3 mg/dL (ref 0.0–1.2)
CO2: 31 mmol/L — ABNORMAL HIGH (ref 20–29)
Calcium: 9.9 mg/dL (ref 8.7–10.2)
Chloride: 95 mmol/L — ABNORMAL LOW (ref 96–106)
Creatinine, Ser: 0.84 mg/dL (ref 0.57–1.00)
GFR calc Af Amer: 90 mL/min/{1.73_m2} (ref >59)
GFR calc non Af Amer: 78 mL/min/{1.73_m2} (ref >59)
Globulin, Total: 2.5 g/dL (ref 1.5–4.5)
Glucose: 94 mg/dL (ref 65–99)
Potassium: 3.1 mmol/L — ABNORMAL LOW (ref 3.5–5.2)
Sodium: 146 mmol/L — ABNORMAL HIGH (ref 134–144)
Total Protein: 7.3 g/dL (ref 6.0–8.5)

## 2020-01-23 LAB — LIPID PANEL
Chol/HDL Ratio: 3.6 ratio (ref 0.0–4.4)
Cholesterol, Total: 134 mg/dL (ref 100–199)
HDL: 37 mg/dL — ABNORMAL LOW (ref >39)
LDL Chol Calc (NIH): 58 mg/dL (ref 0–99)
Triglycerides: 243 mg/dL — ABNORMAL HIGH (ref 0–149)
VLDL Cholesterol Cal: 39 mg/dL (ref 5–40)

## 2020-01-23 LAB — HEMOGLOBIN A1C
Est. average glucose Bld gHb Est-mCnc: 120 mg/dL
Hgb A1c MFr Bld: 5.8 % — ABNORMAL HIGH (ref 4.8–5.6)

## 2020-01-23 NOTE — Progress Notes (Signed)
Chief Complaint  Patient presents with  . Hypertension    nonfasting med check. No new concerns. She thinks Dr. Paulita Fujita may be checking TSH next week, but not sure. Did have carpal tunnel release (R)July 1st-Dr.Murphy and having (L) done August 26th. Patient wants to know if pristiq could cause her bp to be high.     Patient presents for follow-up on chronic problems.  She had labs done prior to her visit, see below.  She saw Dr. Fredonia Highland in June for carpal tunnel syndrome (noted on EMG, bilaterally).  7/1 had R CTR, not back to normal yet. Has some pain at the anterior wrist, tender to touch.  No numbness, tingling or weakness.  Planning for L CTR next month.  Hypertension:Losartan dose was increased from 50 to 58m in June, due to persistently elevated BP's. She continues on HCTZ 12.550mas well.  BP's have been running 150-170/80-90's since raising the dose.  Sometime sees upper 130's/80's, but mostly >140-150. She recalls that her BP was 150/90 even during her conscious sedation. Blood pressures have been up since starting Pristiq. She is doing well on Pristiq. She has been cutting back on her sodium in her diet (salty snacks, not adding salt).  Denies side effects, headaches (only at night related to CPAP head gear occasionally), dizziness, chest pain, palpitations (only related to excess caffeine, improved since cutting back), edema. BP Readings from Last 3 Encounters:  07/26/19 (!) 143/76  07/11/19 112/68  04/28/19 132/82   Hyperlipidemia follow-up: Patient is reportedly following a low cholesterol diet. Compliant withatorvastatin8046mand is currently taking fish oil 3000m30mily, tolerating without side effects. We did get a note from her insurance questioning compliance to statin, as was noted that she fills it late.  She reports she has gotten better about missing any pills since last visit.  She continues to do intermittent fasting.  Eating 2 meals/day, little red meat (mostly  turk[place]icken), eating more salads and vegetables. Cut out ice cream. Fewer days of emotional eating, has better coping skills. Still working with her sister(certified life coach).  PCOS/insulin resistance/suspected DM: Compliant with metformin.Denies polydipsia, polyuria or side effects of medication.  Fatty liver--LFT's have remained elevated. See below for recent labs.  She underwent liver biopsy on 07/11/19, showing mildly active steatohepatitis, no fibrosis. Last saw Dr. OutlPaulita Fujita5/2021.  Noted that LFT's had improved with weight loss and cutting back on alcohol. She shows me on her phone that cortisol (PM) was low (1.0--collected 10am). TSH was also suppressed (see below).  She states that he referred her to endocrinologist--she realizes she forgot to tell him that she was on Synthroid. She has f/u with him in August.  Depression/anxiety:She is now under the care of Dr. KaurToy Careeds were changed to Trintellix in 01/2019, which she had trouble tolerating due to nausea, and didn't see much benefit, so was then changed to [place]11/2020. Dr. KaurToy Caren added Lithium and levothyroxine 25mc49m 05/2019.  TSH was normal on last check done here.  Low at 0.29 on 11/20/19 with Dr. OutlaPaulita Fujitathroid was subsequently stopped. Lab Results  Component Value Date   TSH 1.470 07/20/2019   She saw Dr. Kaur Toy Caree then--and she agreed with stopping the Synthroid. She hasn't noticed any difference in how she feels since stopping it.   Shecontinues to seeDr. OchenBrent BulladelLimmie Patricia CarolAnitaly. Coping skills are better. Emotional eating is less often.  Exercise "not enough". She has been walking--only able  to get out 2 days/week, for at least 30 minutes, including hills. Has a partner to walk with sometimes, which helps.  Chronic back pain, with bilateral sciatica. She is under the care of Dr. Ron Agee.  At her last visit she reported pain was doing okay (managed with  tylenol in AM, diclofenac in PM, and 344m BID of gabapentin).  She had some improvement in nerve pain after RF ablation 09/2018, but had persistent muscular pain and sciatica.  She developed worsening sciatica (R side) in late April.  She was treated with prednisone taper and tizanidine, which helped. Concern for issues at L3-4. Last MRI 3 years ago. MRI scheduled for Saturday. Currently denies pain.  Asthma: Doing well on Arnuity. Needing albuterol just 1-2x/month.   OSA: Compliant with CPAP. Denies feeling unrefreshed, and is less sleepy during the day since on CPAP (only tired mid-afternoon if she eats too much at lunch). She last saw pulmonary in 03/2019, and has an appt scheduled in November.   PMH, PSH, SH reviewed  Outpatient Encounter Medications as of 01/24/2020  Medication Sig  . ALPRAZolam (XANAX) 0.5 MG tablet TAKE 1/2 -1 TABLET BY MOUTH 3 TIMES DAILY AS NEEDED FOR ANXIETY  . atorvastatin (LIPITOR) 80 MG tablet TAKE 1 TABLET(80 MG) BY MOUTH DAILY  . CALCIUM PO Take 1,000 mg by mouth daily.  . cholecalciferol (VITAMIN D3) 25 MCG (1000 UNIT) tablet Take 1,000 Units by mouth daily.  .Marland KitchenDesvenlafaxine Succinate (PRISTIQ PO) Take 100 mg by mouth daily.  . diclofenac (VOLTAREN) 75 MG EC tablet TAKE 1 TABLET(75 MG) BY MOUTH TWICE DAILY  . Ferrous Sulfate (IRON) 325 (65 Fe) MG TABS Take 1 tablet by mouth daily.  . fluticasone (FLONASE) 50 MCG/ACT nasal spray INSTILL 1 TO 2 SPRAYS INTO EACH NOSTRIL ONCE A DAY  . Fluticasone Furoate (ARNUITY ELLIPTA) 100 MCG/ACT AEPB Inhale 1 puff into the lungs daily.  .Marland Kitchengabapentin (NEURONTIN) 300 MG capsule TAKE 1 CAPSULE BY MOUTH TWICE DAILY  . hydrochlorothiazide (MICROZIDE) 12.5 MG capsule TAKE 1 CAPSULE(12.5 MG) BY MOUTH DAILY  . L-Methylfolate-Algae (DEPLIN 15 PO) Take 1 capsule by mouth daily.  .Marland Kitchenloratadine (CLARITIN) 10 MG tablet Take 10 mg by mouth daily.  .Marland Kitchenlosartan (COZAAR) 50 MG tablet TAKE 1 TABLET BY MOUTH DAILY  . metFORMIN  (GLUCOPHAGE-XR) 500 MG 24 hr tablet Take 2 tablets in the morning, 1 in the evening  . Multiple Vitamins-Minerals (ONE-A-DAY MENS 50+ PO) Take 1 tablet by mouth daily.  . Omega-3 Fatty Acids (FISH OIL) 1200 MG CAPS Take 3 capsules by mouth daily.  .Marland Kitchenomeprazole (PRILOSEC) 20 MG capsule Take 20 mg by mouth daily.    . ondansetron (ZOFRAN) 4 MG tablet Take 4 mg by mouth every 8 (eight) hours as needed for nausea or vomiting.  .Marland KitchentiZANidine (ZANAFLEX) 4 MG tablet Take 4 mg by mouth as needed for muscle spasms.  . vitamin B-12 (CYANOCOBALAMIN) 1000 MCG tablet Take 1,000 mcg by mouth daily.    . vitamin C (ASCORBIC ACID) 500 MG tablet Take 500 mg by mouth daily.  . [DISCONTINUED] losartan (COZAAR) 50 MG tablet TAKE 1 TABLET BY MOUTH DAILY (Patient taking differently: Take 75 mg by mouth daily. TAKE 1 TABLET BY MOUTH DAILY)  . [DISCONTINUED] metFORMIN (GLUCOPHAGE-XR) 500 MG 24 hr tablet TAKE 2 TABLETS BY MOUTH EVERY MORNING AND 1 TABLET EVERY EVENING  . amLODipine (NORVASC) 5 MG tablet Take 1 tablet (5 mg total) by mouth daily.  .Marland Kitchenicosapent Ethyl (VASCEPA) 1 g  capsule Take 2 capsules (2 g total) by mouth 2 (two) times daily.  . [DISCONTINUED] Calcium Carbonate-Vitamin D (CALCIUM + D) 600-200 MG-UNIT TABS Take 2 tablets by mouth daily.    . [DISCONTINUED] flintstones complete (FLINTSTONES) 60 MG chewable tablet Chew 1 tablet by mouth daily.  . [DISCONTINUED] Ketorolac Tromethamine 15.75 MG/SPRAY SOLN Place 1 spray into the nose every 6 (six) hours as needed (pain).  . [DISCONTINUED] levothyroxine (SYNTHROID) 25 MCG tablet Take 25 mcg by mouth daily before breakfast.  . [DISCONTINUED] lithium carbonate 300 MG capsule Take 300 mg by mouth daily.  . [DISCONTINUED] Multiple Vitamins-Minerals (MULTIVITAMIN WITH MINERALS) tablet Take 1 tablet by mouth daily.   No facility-administered encounter medications on file as of 01/24/2020.   Allergies  Allergen Reactions  . Meloxicam Itching    ROS: no fever,  chills, URI symptoms. No bleeding, bruising, rash. No nausea, vomiting. Ongoing diarrhea. No chest pain, shortness of breath.  Denies edema. Hasn't needed much albuterol Moods are improved. Back pain per HPI, MRI scheduled.   PHYSICAL EXAM:  BP (!) 150/100   Pulse 80   Ht 5' 5"  (1.651 m)   Wt (!) 245 lb (111.1 kg)   LMP 02/09/2009 (Exact Date)   BMI 40.77 kg/m   Wt Readings from Last 3 Encounters:  01/24/20 (!) 245 lb (111.1 kg)  07/26/19 257 lb 12.8 oz (116.9 kg)  07/11/19 260 lb (117.9 kg)   02/2019 Wt 272# 12.8 oz  Pleasant female in no distress HEENT: conjunctiva and sclera are clear, EOMI.  Wearing mask Neck: no lymphadenopathy, thyromegaly or mass Heart: regular rate and rhythm, no murmur Lungs: clear, no wheezing Back: no spinal or CVA tenderness Abdomen: soft, obese, nontender Extremities: no edema Skin: normal turgor, no visible rash Neuro: alert and oriented, normal gait Psych:  Normal eye contact, speech, hygiene and grooming  Lab Results  Component Value Date   HGBA1C 5.8 (H) 01/22/2020   Fasting glucose 94    Chemistry      Component Value Date/Time   NA 146 (H) 01/22/2020 0829   K 3.1 (L) 01/22/2020 0829   CL 95 (L) 01/22/2020 0829   CO2 31 (H) 01/22/2020 0829   BUN 13 01/22/2020 0829   CREATININE 0.84 01/22/2020 0829   CREATININE 0.97 03/09/2017 1110      Component Value Date/Time   CALCIUM 9.9 01/22/2020 0829   ALKPHOS 82 01/22/2020 0829   AST 49 (H) 01/22/2020 0829   ALT 58 (H) 01/22/2020 0829   BILITOT 0.3 01/22/2020 0829     Lab Results  Component Value Date   CHOL 134 01/22/2020   HDL 37 (L) 01/22/2020   LDLCALC 58 01/22/2020   TRIG 243 (H) 01/22/2020   CHOLHDL 3.6 01/22/2020    ASSESSMENT/PLAN:  Essential hypertension - poorly controlled. Cont losartan and HCTZ, and add amlodipine 49m daily, titrate up if needed. Low Na diet, exercise, wt loss - Plan: losartan (COZAAR) 50 MG tablet, amLODipine (NORVASC) 5 MG tablet  Mixed  hyperlipidemia - change to Vascepa. Cont lowfat diet. Recheck lipids at f/u visit - Plan: icosapent Ethyl (VASCEPA) 1 g capsule  Steatohepatitis - monitored by GI. Cont weight loss  Insulin resistance syndrome - continue metformin.  Weight loss encouraged. Can try stopping metformin as trial for diarrhea (and if A1c rises, start another med) - Plan: metFORMIN (GLUCOPHAGE-XR) 500 MG 24 hr tablet  Hypokalemia - suspect related to diarrhea and HCTZ. Reviewed potassium-rich diet.  Diarrhea, unspecified type - has d/w GI.  disc poss SE to metformin--can try different type, vs trial off, and can try fibeer supplement daily  Depression, recurrent ([place]) - improved on current regimen. Synthroid stopped due to low TSH. Under care of therapist and psychiatrist   Amlodipine 82m, added to losartan and HCTZ Ultiately, to streamline meds, can change to higher amlodipine and cut out HCTZ if no swelling develops, esp if ongoing potassium issues.  Fu 4-6 weeks on HTN Fasting.  Repeat lipids at that visit, and other labs depending on what is done at GI  Over 60 min FTF spent with pt, more than 1/2 spent counseling. Additional time spent in chart review and documentation.

## 2020-01-24 ENCOUNTER — Other Ambulatory Visit: Payer: Self-pay

## 2020-01-24 ENCOUNTER — Encounter: Payer: Self-pay | Admitting: Family Medicine

## 2020-01-24 ENCOUNTER — Ambulatory Visit (INDEPENDENT_AMBULATORY_CARE_PROVIDER_SITE_OTHER): Payer: BC Managed Care – PPO | Admitting: Family Medicine

## 2020-01-24 ENCOUNTER — Other Ambulatory Visit: Payer: Self-pay | Admitting: Physical Medicine and Rehabilitation

## 2020-01-24 VITALS — BP 150/100 | HR 80 | Ht 65.0 in | Wt 245.0 lb

## 2020-01-24 DIAGNOSIS — F339 Major depressive disorder, recurrent, unspecified: Secondary | ICD-10-CM

## 2020-01-24 DIAGNOSIS — K7581 Nonalcoholic steatohepatitis (NASH): Secondary | ICD-10-CM | POA: Diagnosis not present

## 2020-01-24 DIAGNOSIS — E8881 Metabolic syndrome: Secondary | ICD-10-CM

## 2020-01-24 DIAGNOSIS — I10 Essential (primary) hypertension: Secondary | ICD-10-CM | POA: Diagnosis not present

## 2020-01-24 DIAGNOSIS — R197 Diarrhea, unspecified: Secondary | ICD-10-CM

## 2020-01-24 DIAGNOSIS — E876 Hypokalemia: Secondary | ICD-10-CM

## 2020-01-24 DIAGNOSIS — E782 Mixed hyperlipidemia: Secondary | ICD-10-CM | POA: Diagnosis not present

## 2020-01-24 MED ORDER — AMLODIPINE BESYLATE 5 MG PO TABS: 5.0000 mg | ORAL_TABLET | Freq: Every day | ORAL | 1 refills | Status: DC

## 2020-01-24 MED ORDER — LOSARTAN POTASSIUM 50 MG PO TABS: ORAL_TABLET | ORAL | 1 refills | Status: DC

## 2020-01-24 MED ORDER — METFORMIN HCL ER 500 MG PO TB24: ORAL_TABLET | ORAL | 5 refills | Status: DC

## 2020-01-24 MED ORDER — ICOSAPENT ETHYL 1 G PO CAPS: 2.0000 g | ORAL_CAPSULE | Freq: Two times a day (BID) | ORAL | 2 refills | Status: DC

## 2020-01-24 NOTE — Patient Instructions (Addendum)
Consider trial off of metformin to see if diarrhea improves. If it does, options include changing type of metformin, vs if sugars go up, then we can use other treatments (if diagnosed with DM off meds--A1c of 6.5, or fasting sugars 26 or higher more than once), such as [place], Jardiance or injectables such as Ozempic, Trulicity or Bydureon B-cise.  Try eating bananas to replace potassium and bulk up stools. Sometimes fiber supplement can bulk up the stools.  The potassium may be low due to both the diarrhea and the diuretic.  Have Dr. Paulita Fujita recheck electrolytes (this was the first time I've seen low potassium), as well as the thyroid and cortisol (now that you are OFF synthroid since April).  We are going to try a prescription fish oil in place of your over-the-counter. Take 2 pills twice daily.  Start amlodipine once daily.  After 7-10 days if your blood pressure is consistently 140 or higher still, then increase to 1.5 tablets (dose of 7.68m). Monitor your blood pressure and record on th paper provided.  Bring this paper to your visit.

## 2020-01-26 ENCOUNTER — Encounter: Payer: Self-pay | Admitting: Family Medicine

## 2020-01-27 ENCOUNTER — Other Ambulatory Visit: Payer: Self-pay

## 2020-01-27 ENCOUNTER — Ambulatory Visit
Admission: RE | Admit: 2020-01-27 | Discharge: 2020-01-27 | Disposition: A | Discharge status: Home / Self Care | Payer: BC Managed Care – PPO | Source: Ambulatory Visit | Attending: Physical Medicine and Rehabilitation | Admitting: Physical Medicine and Rehabilitation

## 2020-01-27 DIAGNOSIS — M48061 Spinal stenosis, lumbar region without neurogenic claudication: Secondary | ICD-10-CM | POA: Diagnosis not present

## 2020-01-27 DIAGNOSIS — M545 Low back pain, unspecified: Secondary | ICD-10-CM

## 2020-01-29 DIAGNOSIS — F419 Anxiety disorder, unspecified: Secondary | ICD-10-CM | POA: Diagnosis not present

## 2020-01-29 DIAGNOSIS — M5416 Radiculopathy, lumbar region: Secondary | ICD-10-CM | POA: Diagnosis not present

## 2020-01-29 DIAGNOSIS — F3289 Other specified depressive episodes: Secondary | ICD-10-CM | POA: Diagnosis not present

## 2020-01-29 DIAGNOSIS — M47816 Spondylosis without myelopathy or radiculopathy, lumbar region: Secondary | ICD-10-CM | POA: Diagnosis not present

## 2020-01-29 DIAGNOSIS — M545 Low back pain: Secondary | ICD-10-CM | POA: Diagnosis not present

## 2020-01-31 ENCOUNTER — Telehealth: Payer: Self-pay | Admitting: Family Medicine

## 2020-01-31 NOTE — Telephone Encounter (Signed)
P.A. VASCEPA  °

## 2020-02-01 DIAGNOSIS — K760 Fatty (change of) liver, not elsewhere classified: Secondary | ICD-10-CM | POA: Diagnosis not present

## 2020-02-02 DIAGNOSIS — M5416 Radiculopathy, lumbar region: Secondary | ICD-10-CM | POA: Diagnosis not present

## 2020-02-02 DIAGNOSIS — M545 Low back pain: Secondary | ICD-10-CM | POA: Diagnosis not present

## 2020-02-03 NOTE — Telephone Encounter (Signed)
PA approved.

## 2020-02-06 NOTE — Telephone Encounter (Signed)
Pt informed

## 2020-02-07 DIAGNOSIS — K7581 Nonalcoholic steatohepatitis (NASH): Secondary | ICD-10-CM | POA: Diagnosis not present

## 2020-02-07 DIAGNOSIS — R197 Diarrhea, unspecified: Secondary | ICD-10-CM | POA: Diagnosis not present

## 2020-02-12 DIAGNOSIS — F3289 Other specified depressive episodes: Secondary | ICD-10-CM | POA: Diagnosis not present

## 2020-02-12 DIAGNOSIS — F419 Anxiety disorder, unspecified: Secondary | ICD-10-CM | POA: Diagnosis not present

## 2020-02-19 DIAGNOSIS — F3289 Other specified depressive episodes: Secondary | ICD-10-CM | POA: Diagnosis not present

## 2020-02-19 DIAGNOSIS — F419 Anxiety disorder, unspecified: Secondary | ICD-10-CM | POA: Diagnosis not present

## 2020-02-22 DIAGNOSIS — G5602 Carpal tunnel syndrome, left upper limb: Secondary | ICD-10-CM | POA: Diagnosis not present

## 2020-02-22 HISTORY — PX: CARPAL TUNNEL RELEASE: SHX101

## 2020-02-26 DIAGNOSIS — F419 Anxiety disorder, unspecified: Secondary | ICD-10-CM | POA: Diagnosis not present

## 2020-02-26 DIAGNOSIS — F3289 Other specified depressive episodes: Secondary | ICD-10-CM | POA: Diagnosis not present

## 2020-02-28 DIAGNOSIS — R946 Abnormal results of thyroid function studies: Secondary | ICD-10-CM | POA: Diagnosis not present

## 2020-02-28 DIAGNOSIS — R197 Diarrhea, unspecified: Secondary | ICD-10-CM | POA: Diagnosis not present

## 2020-02-28 DIAGNOSIS — R110 Nausea: Secondary | ICD-10-CM | POA: Diagnosis not present

## 2020-02-28 DIAGNOSIS — Z92241 Personal history of systemic steroid therapy: Secondary | ICD-10-CM | POA: Diagnosis not present

## 2020-02-29 DIAGNOSIS — M5416 Radiculopathy, lumbar region: Secondary | ICD-10-CM | POA: Diagnosis not present

## 2020-02-29 DIAGNOSIS — M47816 Spondylosis without myelopathy or radiculopathy, lumbar region: Secondary | ICD-10-CM | POA: Diagnosis not present

## 2020-02-29 DIAGNOSIS — M545 Low back pain: Secondary | ICD-10-CM | POA: Diagnosis not present

## 2020-03-05 ENCOUNTER — Encounter: Payer: Self-pay | Admitting: Family Medicine

## 2020-03-05 DIAGNOSIS — F3289 Other specified depressive episodes: Secondary | ICD-10-CM | POA: Diagnosis not present

## 2020-03-05 DIAGNOSIS — I10 Essential (primary) hypertension: Secondary | ICD-10-CM

## 2020-03-05 DIAGNOSIS — F419 Anxiety disorder, unspecified: Secondary | ICD-10-CM | POA: Diagnosis not present

## 2020-03-05 MED ORDER — AMLODIPINE BESYLATE 5 MG PO TABS: 7.5000 mg | ORAL_TABLET | Freq: Every day | ORAL | 0 refills | Status: DC

## 2020-03-06 DIAGNOSIS — R110 Nausea: Secondary | ICD-10-CM | POA: Diagnosis not present

## 2020-03-06 DIAGNOSIS — R946 Abnormal results of thyroid function studies: Secondary | ICD-10-CM | POA: Diagnosis not present

## 2020-03-06 DIAGNOSIS — Z92241 Personal history of systemic steroid therapy: Secondary | ICD-10-CM | POA: Diagnosis not present

## 2020-03-06 DIAGNOSIS — R197 Diarrhea, unspecified: Secondary | ICD-10-CM | POA: Diagnosis not present

## 2020-03-10 NOTE — Progress Notes (Signed)
Chief Complaint  Patient presents with  . Hypertension    fasting med check. No new concerns.     Patient presents for follow-up on hypertension.  BP was inadequately controlled on losartan and HCTZ, so amlodipine was added. It was felt that Pristiq may be a factor contributing to the higher blood pressures (but was helping). She is currently taking 51m losartan, 12.537mHCTZ and amlodipine 7.65m56mhad titrated up from 65mg34me to BP's remaining above goal). She is tolerating this without side effects.   BP's are running 124-149/75-85 on the 7.65mg 37me (had been up to 140's-160/80's-93 on the 65mg d13m) She denies headaches, dizziness, chest pain, edema.  She was noted to have hypokalemia--felt to be related to issues with diarrhea and also diuretic use. She was instructed on potassium-rich foods. Lab Results  Component Value Date   K 3.1 (L) 01/22/2020  8/26 she had potassium rechecked at surgical center prior to her L CTR, and was told it was okay. Diarrhea improved when off metformin (see below).  Mixed hyperlipidemia--she was started on Vascepa at last visit (in place of fish oil, in addition to lipitor), and is due for recheck of triglycerides.  She has been trying to follow lowfat diet. She reports that she had to stop it for her surgery, and took a week to get the medication.  Has only been back on it for 16 days Lab Results  Component Value Date   CHOL 134 01/22/2020   HDL 37 (L) 01/22/2020   LDLCALC 58 01/22/2020   TRIG 243 (H) 01/22/2020   CHOLHDL 3.6 01/22/2020   Diarrhea--she did a trial off metformin and stools improved.  She has been on metformin for a long time, for insulin resistance, PCOS, pre-diabetes.   The trial off the metformin was for 1 week. Restarted and diarrhea recurred. Last took it 9/10. Admits to not drinking enough water.  She has had some mild constipation since being off.  Plans to try and drink more water and restart metamucil.  Chart reviewed--A1c hasn't  been above 5.8 (ranging 5.2-5.8 over the last few years, max of 6.2 once back in 2014).  Had ACTH stimulation test through Dr. Kerr. Buddy Dutyreports it was normal. TSH was also checked, normal at 1.5. (had been low related to taking synthroid from psych)  Chronic back pain: She had another steroid injection on the right "at two levels", which helped, and she feels she is now at her baseline. MRI of back in 12/2019: IMPRESSION: Lumbar spondylosis as outlined and having progressed at several levels as compared to the MRI of 11/18/2016. Findings are most notably as follows.  At L5-S1, redemonstrated advanced disc degeneration. As before, a broad-based central disc protrusion eccentric to the right contributes to right greater than left subarticular stenosis, encroaching upon the descending right S1 nerve root. Correlate for right S1 radiculopathy. Unchanged moderate right neural foraminal narrowing at this level.  At L4-L5, progressive moderate/advanced disc degeneration. Progressive multifactorial bilateral subarticular stenosis (moderate/severe right, severe left) with potential to affect either descending L5 nerve root. Progressive bilateral neural foraminal narrowing (moderate right, moderate/severe left).  At L3-L4, progressive multifactorial mild left greater than right subarticular stenosis and central canal narrowing. Unchanged borderline mild right neural foraminal narrowing.  She reports she had labs done for Dr. OutlawPaulita Fujita5, hepatic panel: AST 40 (39 nl) ALT 50 (up to 52 nl) Rest normal (only AST 1 point above normal).   PMH, PSH, SH reviewed  Outpatient Encounter Medications as  of 03/11/2020  Medication Sig Note  . ALPRAZolam (XANAX) 0.5 MG tablet TAKE 1/2 -1 TABLET BY MOUTH 3 TIMES DAILY AS NEEDED FOR ANXIETY   . amLODipine (NORVASC) 5 MG tablet Take 1.5 tablets (7.5 mg total) by mouth daily.   Marland Kitchen atorvastatin (LIPITOR) 80 MG tablet TAKE 1 TABLET(80 MG) BY MOUTH DAILY    . CALCIUM PO Take 1,000 mg by mouth daily.   . cholecalciferol (VITAMIN D3) 25 MCG (1000 UNIT) tablet Take 1,000 Units by mouth daily.   Marland Kitchen Desvenlafaxine Succinate (PRISTIQ PO) Take 100 mg by mouth daily.   . diclofenac (VOLTAREN) 75 MG EC tablet Take 1 tablet up to twice daily as needed for pain.  Take with food   . Ferrous Sulfate (IRON) 325 (65 Fe) MG TABS Take 1 tablet by mouth daily.   . fluticasone (FLONASE) 50 MCG/ACT nasal spray INSTILL 1 TO 2 SPRAYS INTO EACH NOSTRIL ONCE A DAY   . Fluticasone Furoate (ARNUITY ELLIPTA) 100 MCG/ACT AEPB Inhale 1 puff into the lungs daily.   Marland Kitchen gabapentin (NEURONTIN) 300 MG capsule TAKE 1 CAPSULE BY MOUTH TWICE DAILY   . hydrochlorothiazide (MICROZIDE) 12.5 MG capsule TAKE 1 CAPSULE(12.5 MG) BY MOUTH DAILY   . icosapent Ethyl (VASCEPA) 1 g capsule Take 2 capsules (2 g total) by mouth 2 (two) times daily.   Marland Kitchen L-Methylfolate-Algae (DEPLIN 15 PO) Take 1 capsule by mouth daily.   Marland Kitchen loratadine (CLARITIN) 10 MG tablet Take 10 mg by mouth daily.   Marland Kitchen losartan (COZAAR) 50 MG tablet TAKE 1 TABLET BY MOUTH DAILY   . Multiple Vitamins-Minerals (ONE-A-DAY MENS 50+ PO) Take 1 tablet by mouth daily.   Marland Kitchen omeprazole (PRILOSEC) 20 MG capsule Take 20 mg by mouth daily.     . vitamin B-12 (CYANOCOBALAMIN) 1000 MCG tablet Take 1,000 mcg by mouth daily.     . vitamin C (ASCORBIC ACID) 500 MG tablet Take 500 mg by mouth daily.   . [DISCONTINUED] diclofenac (VOLTAREN) 75 MG EC tablet TAKE 1 TABLET(75 MG) BY MOUTH TWICE DAILY   . [DISCONTINUED] Omega-3 Fatty Acids (FISH OIL) 1200 MG CAPS Take 3 capsules by mouth daily.   . metFORMIN (GLUCOPHAGE-XR) 500 MG 24 hr tablet Take 2 tablets in the morning, 1 in the evening (Patient not taking: Reported on 03/11/2020) 03/11/2020: Stopped taking 9/11  . ondansetron (ZOFRAN) 4 MG tablet Take 4 mg by mouth every 8 (eight) hours as needed for nausea or vomiting. (Patient not taking: Reported on 03/11/2020)   . tiZANidine (ZANAFLEX) 4 MG tablet  Take 4 mg by mouth as needed for muscle spasms. (Patient not taking: Reported on 03/11/2020)    No facility-administered encounter medications on file as of 03/11/2020.   Allergies  Allergen Reactions  . Meloxicam Itching   ROS: no fever, chills, headaches, dizziness, chest pain, shortness of breath, edema.  Mild constipation occasionally; diarrhea resolved since off metformin (and recurred when was back on it).  No blood in stool. Back pain improved, see HPI. No bleeding, bruising, rash.   PHYSICAL EXAM:  BP 130/76   Pulse 76   Ht 5' 4"  (1.626 m)   Wt 248 lb (112.5 kg)   LMP 02/09/2009 (Exact Date)   BMI 42.57 kg/m   Wt Readings from Last 3 Encounters:  03/11/20 248 lb (112.5 kg)  01/24/20 (!) 245 lb (111.1 kg)  07/26/19 257 lb 12.8 oz (116.9 kg)    Pleasant female in no distress HEENT: conjunctiva and sclera are clear, EOMI. Wearing  mask Neck: no lymphadenopathy, thyromegaly or mass Heart: regular rate and rhythm, no murmur Lungs: clear, no wheezing Back: no spinal or CVA tenderness, no SI tenderness. Abdomen: soft, obese, nontender Extremities: no edema Skin: normal turgor, no visible rash Neuro: alert and oriented, normal gait Psych:  Normal eye contact, speech, hygiene and grooming  ASSESSMENT/PLAN:  Essential hypertension - well controlled on current regimen, with newest med being 7.8m amlodipine. Cont (refilled the other day)  Mixed hyperlipidemia - Will hold off on labs since hasn't been on the Vascepa for long.  Recheck with labs at next visit (3 mos)  Insulin resistance syndrome - intolerant of metformin due to diarrhea.  Doubt that she will need.  If A1c 6.5 or higher, can start new med (to help with wt loss, CV protection)  Hypokalemia - resolved per check prior to surgery; suspect related to diarrhea which improved  Diarrhea, unspecified type - related to metformin, improved on trial off and recurred with restart. Stay off metformin. Recheck A1c in 3  mos  Medication monitoring encounter  Chronic low back pain - improved s/p injection - Plan: diclofenac (VOLTAREN) 75 MG EC tablet  Need for influenza vaccination - Plan: Flu Vaccine QUAD 6+ mos PF IM (Fluarix Quad PF)   F/u 3 months A1c at visit Will come fasting for labs (due for recheck lipids)

## 2020-03-11 ENCOUNTER — Other Ambulatory Visit: Payer: Self-pay

## 2020-03-11 ENCOUNTER — Ambulatory Visit (INDEPENDENT_AMBULATORY_CARE_PROVIDER_SITE_OTHER): Payer: BC Managed Care – PPO | Admitting: Family Medicine

## 2020-03-11 ENCOUNTER — Encounter: Payer: Self-pay | Admitting: Family Medicine

## 2020-03-11 VITALS — BP 130/76 | HR 76 | Ht 64.0 in | Wt 248.0 lb

## 2020-03-11 DIAGNOSIS — G8929 Other chronic pain: Secondary | ICD-10-CM

## 2020-03-11 DIAGNOSIS — Z23 Encounter for immunization: Secondary | ICD-10-CM | POA: Diagnosis not present

## 2020-03-11 DIAGNOSIS — E8881 Metabolic syndrome: Secondary | ICD-10-CM | POA: Diagnosis not present

## 2020-03-11 DIAGNOSIS — E782 Mixed hyperlipidemia: Secondary | ICD-10-CM

## 2020-03-11 DIAGNOSIS — E876 Hypokalemia: Secondary | ICD-10-CM | POA: Diagnosis not present

## 2020-03-11 DIAGNOSIS — Z5181 Encounter for therapeutic drug level monitoring: Secondary | ICD-10-CM

## 2020-03-11 DIAGNOSIS — I10 Essential (primary) hypertension: Secondary | ICD-10-CM

## 2020-03-11 DIAGNOSIS — M545 Low back pain: Secondary | ICD-10-CM

## 2020-03-11 DIAGNOSIS — R197 Diarrhea, unspecified: Secondary | ICD-10-CM

## 2020-03-11 MED ORDER — DICLOFENAC SODIUM 75 MG PO TBEC: DELAYED_RELEASE_TABLET | ORAL | 1 refills | Status: DC

## 2020-03-11 NOTE — Patient Instructions (Signed)
Continue the 1.5 tablets of amlodipine. Stay off the metformin. Continue the Vascepa and lowfat diet.  Come fasting for your next visit.

## 2020-03-12 DIAGNOSIS — F3289 Other specified depressive episodes: Secondary | ICD-10-CM | POA: Diagnosis not present

## 2020-03-12 DIAGNOSIS — F419 Anxiety disorder, unspecified: Secondary | ICD-10-CM | POA: Diagnosis not present

## 2020-03-18 ENCOUNTER — Other Ambulatory Visit: Payer: Self-pay | Admitting: Family Medicine

## 2020-03-18 DIAGNOSIS — F3289 Other specified depressive episodes: Secondary | ICD-10-CM | POA: Diagnosis not present

## 2020-03-18 DIAGNOSIS — I10 Essential (primary) hypertension: Secondary | ICD-10-CM

## 2020-03-18 DIAGNOSIS — F419 Anxiety disorder, unspecified: Secondary | ICD-10-CM | POA: Diagnosis not present

## 2020-03-25 DIAGNOSIS — F419 Anxiety disorder, unspecified: Secondary | ICD-10-CM | POA: Diagnosis not present

## 2020-03-25 DIAGNOSIS — F3289 Other specified depressive episodes: Secondary | ICD-10-CM | POA: Diagnosis not present

## 2020-04-01 DIAGNOSIS — F419 Anxiety disorder, unspecified: Secondary | ICD-10-CM | POA: Diagnosis not present

## 2020-04-01 DIAGNOSIS — F3289 Other specified depressive episodes: Secondary | ICD-10-CM | POA: Diagnosis not present

## 2020-04-14 ENCOUNTER — Other Ambulatory Visit: Payer: Self-pay | Admitting: Internal Medicine

## 2020-04-14 ENCOUNTER — Other Ambulatory Visit: Payer: Self-pay | Admitting: Family Medicine

## 2020-04-15 DIAGNOSIS — F419 Anxiety disorder, unspecified: Secondary | ICD-10-CM | POA: Diagnosis not present

## 2020-04-15 DIAGNOSIS — F3289 Other specified depressive episodes: Secondary | ICD-10-CM | POA: Diagnosis not present

## 2020-04-16 ENCOUNTER — Other Ambulatory Visit: Payer: Self-pay | Admitting: Family Medicine

## 2020-04-16 DIAGNOSIS — Z1231 Encounter for screening mammogram for malignant neoplasm of breast: Secondary | ICD-10-CM

## 2020-04-17 ENCOUNTER — Other Ambulatory Visit: Payer: Self-pay | Admitting: Family Medicine

## 2020-04-17 DIAGNOSIS — E782 Mixed hyperlipidemia: Secondary | ICD-10-CM

## 2020-04-22 ENCOUNTER — Other Ambulatory Visit: Payer: Self-pay | Admitting: Family Medicine

## 2020-04-22 DIAGNOSIS — E782 Mixed hyperlipidemia: Secondary | ICD-10-CM

## 2020-04-22 DIAGNOSIS — F3289 Other specified depressive episodes: Secondary | ICD-10-CM | POA: Diagnosis not present

## 2020-04-22 DIAGNOSIS — F419 Anxiety disorder, unspecified: Secondary | ICD-10-CM | POA: Diagnosis not present

## 2020-04-23 DIAGNOSIS — R197 Diarrhea, unspecified: Secondary | ICD-10-CM | POA: Diagnosis not present

## 2020-04-23 DIAGNOSIS — K7581 Nonalcoholic steatohepatitis (NASH): Secondary | ICD-10-CM | POA: Diagnosis not present

## 2020-04-23 DIAGNOSIS — Z8371 Family history of colonic polyps: Secondary | ICD-10-CM | POA: Diagnosis not present

## 2020-04-29 ENCOUNTER — Ambulatory Visit: Payer: BC Managed Care – PPO | Admitting: Internal Medicine

## 2020-04-29 ENCOUNTER — Other Ambulatory Visit: Payer: Self-pay

## 2020-04-29 ENCOUNTER — Encounter: Payer: Self-pay | Admitting: Internal Medicine

## 2020-04-29 VITALS — BP 130/72 | HR 116 | Temp 98.2°F | Ht 64.0 in | Wt 249.2 lb

## 2020-04-29 DIAGNOSIS — J4540 Moderate persistent asthma, uncomplicated: Secondary | ICD-10-CM

## 2020-04-29 DIAGNOSIS — G4733 Obstructive sleep apnea (adult) (pediatric): Secondary | ICD-10-CM | POA: Diagnosis not present

## 2020-04-29 DIAGNOSIS — Z23 Encounter for immunization: Secondary | ICD-10-CM

## 2020-04-29 DIAGNOSIS — F3289 Other specified depressive episodes: Secondary | ICD-10-CM | POA: Diagnosis not present

## 2020-04-29 DIAGNOSIS — F419 Anxiety disorder, unspecified: Secondary | ICD-10-CM | POA: Diagnosis not present

## 2020-04-29 NOTE — Progress Notes (Signed)
HPI female never smoker followed for OSA, complicated by obesity, GERD, HBP Followed by Dr. Chase Caller for asthma NPSG 04/23/06- AHI 42/ hr, desat to 89% ---------------------------------------------------------------------------   04/28/2019- 54 year old female never smoker followed for OSA, complicated by obesity, GERD, HBP Followed by Dr. Chase Caller for moderate persistent asthma, on Arnuity CPAP auto 5-15/ [employer] supplies on line. Body weight today 268 lbs Download compliance 100%, AHI 2.2/ hr -----OSA on CPAP, DME: [facility]; pt reports recent pressure decrease ( to 5-15) d/t bloating - been much better since "Loves" her CPAP which is now 56 years old.  Asthma doing well. Has old rescue inhaler of her fathers's she never needs to use.  Covid discussion- continues careful.   04/29/20- 54 year old female never smoker followed for OSA, complicated by obesity, GERD, HBP, Asthma, Hyperlipidemia, Depression,  Followed by Dr. Chase Caller for moderate persistent asthma, on Arnuity CPAP auto 5-15/ [facility]- gets supplies on-line Download compliance- 100%, AHI 1.2/ hr Body weight today- 249 lbs Covid vax- 3 Phizer Flu vax- had Asks about pneumonia vaccine- discussed. Does well with CPAP- old machine now. Ready to replace.   ROS-see HPI   + = positive Constitutional:    weight loss, night sweats, fevers, chills, fatigue, lassitude. HEENT:    headaches, difficulty swallowing, tooth/dental problems, sore throat,       sneezing, itching, ear ache, nasal congestion, post nasal drip, snoring CV:    chest pain, orthopnea, PND, swelling in lower extremities, anasarca,                                                       dizziness, palpitations Resp:   shortness of breath with exertion or at rest.                productive cough,   non-productive cough, coughing up of blood.              change in color of mucus.  wheezing.   Skin:    rash or lesions. GI:  No-   heartburn, indigestion, abdominal  pain, nausea, vomiting, diarrhea,                 change in bowel habits, loss of appetite GU: dysuria, change in color of urine, no urgency or frequency.   flank pain. MS:   joint pain, stiffness, decreased range of motion, back pain. Neuro-     nothing unusual Psych:  change in mood or affect.  depression or anxiety.   memory loss.  OBJ- Physical Exam General- Alert, Oriented, Affect-appropriate, Distress- none acute + obese Skin- rash-none, lesions- none, excoriation- none Lymphadenopathy- none Head- atraumatic            Eyes- Gross vision intact, PERRLA, conjunctivae and secretions clear            Ears- Hearing, canals-normal            Nose- Clear, no-Septal dev, mucus, polyps, erosion, perforation             Throat- Mallampati III-IV , mucosa clear , drainage- none, tonsils- atrophic Neck- flexible , trachea midline, no stridor , thyroid nl, carotid no bruit Chest - symmetrical excursion , unlabored           Heart/CV- RRR , no murmur , no gallop  , no rub, nl  s1 s2                           - JVD- none , edema- none, stasis changes- none, varices- none           Lung- clear to P&A, wheeze- none, cough- none , dullness-none, rub- none           Chest wall-  Abd-  Br/ Gen/ Rectal- Not done, not indicated Extrem- cyanosis- none, clubbing, none, atrophy- none, strength- nl Neuro- grossly intact to observation

## 2020-04-29 NOTE — Patient Instructions (Signed)
Order- DME [facility]- please replace old CPAP machine auto 5-15, mask of choice, humidifier, supplies, Airview/card  Order- Pneumovax 23 pneumonia vaccine  Please call if we can help

## 2020-05-06 DIAGNOSIS — F3289 Other specified depressive episodes: Secondary | ICD-10-CM | POA: Diagnosis not present

## 2020-05-06 DIAGNOSIS — F419 Anxiety disorder, unspecified: Secondary | ICD-10-CM | POA: Diagnosis not present

## 2020-05-13 DIAGNOSIS — F419 Anxiety disorder, unspecified: Secondary | ICD-10-CM | POA: Diagnosis not present

## 2020-05-13 DIAGNOSIS — F3289 Other specified depressive episodes: Secondary | ICD-10-CM | POA: Diagnosis not present

## 2020-05-14 NOTE — Assessment & Plan Note (Signed)
Pending f/u with Dr Chase Caller. She feels well-controlled with no concerns at this visit.  Plan- pneumovax-23 given

## 2020-05-14 NOTE — Assessment & Plan Note (Signed)
Benefits from CPAP with good compliance and control Plan- replace old machine auto 5-15

## 2020-05-15 ENCOUNTER — Encounter: Payer: Self-pay | Admitting: Family Medicine

## 2020-05-20 DIAGNOSIS — F419 Anxiety disorder, unspecified: Secondary | ICD-10-CM | POA: Diagnosis not present

## 2020-05-20 DIAGNOSIS — F3289 Other specified depressive episodes: Secondary | ICD-10-CM | POA: Diagnosis not present

## 2020-05-27 ENCOUNTER — Other Ambulatory Visit: Payer: Self-pay | Admitting: Family Medicine

## 2020-05-27 DIAGNOSIS — F419 Anxiety disorder, unspecified: Secondary | ICD-10-CM | POA: Diagnosis not present

## 2020-05-27 DIAGNOSIS — F3289 Other specified depressive episodes: Secondary | ICD-10-CM | POA: Diagnosis not present

## 2020-05-27 DIAGNOSIS — I10 Essential (primary) hypertension: Secondary | ICD-10-CM

## 2020-05-31 ENCOUNTER — Other Ambulatory Visit: Payer: Self-pay

## 2020-05-31 ENCOUNTER — Ambulatory Visit
Admission: RE | Admit: 2020-05-31 | Discharge: 2020-05-31 | Disposition: A | Discharge status: Home / Self Care | Payer: BC Managed Care – PPO | Source: Ambulatory Visit | Attending: Family Medicine | Admitting: Family Medicine

## 2020-05-31 DIAGNOSIS — Z1231 Encounter for screening mammogram for malignant neoplasm of breast: Secondary | ICD-10-CM | POA: Diagnosis not present

## 2020-06-03 DIAGNOSIS — F419 Anxiety disorder, unspecified: Secondary | ICD-10-CM | POA: Diagnosis not present

## 2020-06-03 DIAGNOSIS — F3289 Other specified depressive episodes: Secondary | ICD-10-CM | POA: Diagnosis not present

## 2020-06-06 NOTE — Telephone Encounter (Signed)
Forgot to notate that P.A. was already completed & I'd called pharmacy and Vascepa went thru for $25 & pt picked up

## 2020-06-10 DIAGNOSIS — F419 Anxiety disorder, unspecified: Secondary | ICD-10-CM | POA: Diagnosis not present

## 2020-06-10 DIAGNOSIS — F3289 Other specified depressive episodes: Secondary | ICD-10-CM | POA: Diagnosis not present

## 2020-06-12 ENCOUNTER — Other Ambulatory Visit: Payer: Self-pay | Admitting: Family Medicine

## 2020-06-12 DIAGNOSIS — I10 Essential (primary) hypertension: Secondary | ICD-10-CM

## 2020-06-18 DIAGNOSIS — M47816 Spondylosis without myelopathy or radiculopathy, lumbar region: Secondary | ICD-10-CM | POA: Diagnosis not present

## 2020-06-18 DIAGNOSIS — M545 Low back pain, unspecified: Secondary | ICD-10-CM | POA: Diagnosis not present

## 2020-06-19 NOTE — Progress Notes (Signed)
Chief Complaint  Patient presents with  . Hypertension    Fasting med check. No new concerns.     Patient presents for 3 month follow-up.  She reports that her left ear feels plugged. Chronic congestion, unchanged. Hearing feels muffled, denies pain.  No popping.  Hypertension.  She is currently taking 73m losartan, 12.527mHCTZ and amlodipine 7.31m29mShe is tolerating this without side effects.   BP's are running 130/80--checked only once when she felt a flutter in her heart (was worried it was high, but it was okay--realized she had to much caffeine). She denies headaches, dizziness, chest pain, edema. She previously had hypokalemia (3.1 in July 2021), related to diarrhea and diuretic.  Recheck was normal (at surgical center, per patient)  Mixed hyperlipidemia--she is taking Vascepa 2 BID and atorvastatin 55m21mDenies side effects.  Some stress-eating recently related to work in October, improved some now.  +chips/pizza, not much sweets. Eating more greens.  Lab Results  Component Value Date   CHOL 134 01/22/2020   HDL 37 (L) 01/22/2020   LDLCALC 58 01/22/2020   TRIG 243 (H) 01/22/2020   CHOLHDL 3.6 01/22/2020   Diarrhea--she did a trial off metformin (1 week), and stools improved. Diarrhea recurred when she restarted.  She stopped taking it mid-September. Due for recheck A1c.  She has been on metformin for a long time, for insulin resistance, PCOS, pre-diabetes.   At her last visit she reported having mild constipation since being off metformin.  Plans to try and drink more water and restart metamucil. She hasn't started metamucil, but is eating more greens.  Still not drinking enough water.   Chronic back pain: scheduled for another ablation in January.  Pain has limited her walking.  They want her to take diclofenac BID x 7 days. Tizanidine continues to help.  Exercise--doing home exercises (core, stretching).  Likes to walk 2 miles/day, but limited due to pain. Hasn't been able  to walk a mile.  PMH, PSH, SH reviewed  Outpatient Encounter Medications as of 06/20/2020  Medication Sig  . amLODipine (NORVASC) 5 MG tablet TAKE 1 AND 1/2 TABLETS(7.5 MG) BY MOUTH DAILY  . ARNUITY ELLIPTA 100 MCG/ACT AEPB INHALE 1 PUFF INTO THE LUNGS DAILY  . atorvastatin (LIPITOR) 80 MG tablet TAKE 1 TABLET(80 MG) BY MOUTH DAILY  . CALCIUM PO Take 1,000 mg by mouth daily.  . cholecalciferol (VITAMIN D3) 25 MCG (1000 UNIT) tablet Take 1,000 Units by mouth daily.  . DeMarland Kitchenvenlafaxine Succinate (PRISTIQ PO) Take 100 mg by mouth daily.  . diclofenac (VOLTAREN) 75 MG EC tablet Take 1 tablet up to twice daily as needed for pain.  Take with food  . Ferrous Sulfate (IRON) 325 (65 Fe) MG TABS Take 1 tablet by mouth daily.  . fluticasone (FLONASE) 50 MCG/ACT nasal spray INSTILL 1 TO 2 SPRAYS INTO EACH NOSTRIL ONCE A DAY  . gabapentin (NEURONTIN) 300 MG capsule TAKE 1 CAPSULE BY MOUTH TWICE DAILY  . hydrochlorothiazide (MICROZIDE) 12.5 MG capsule TAKE 1 CAPSULE(12.5 MG) BY MOUTH DAILY  . L-Methylfolate-Algae (DEPLIN 15 PO) Take 1 capsule by mouth daily.  . loMarland Kitchenatadine (CLARITIN) 10 MG tablet Take 10 mg by mouth daily.  . loMarland Kitchenartan (COZAAR) 50 MG tablet TAKE 1 TABLET BY MOUTH DAILY  . Multiple Vitamins-Minerals (ONE-A-DAY MENS 50+ PO) Take 1 tablet by mouth daily.  . omMarland Kitchenprazole (PRILOSEC) 20 MG capsule Take 20 mg by mouth daily.  . tiMarland KitchenANidine (ZANAFLEX) 4 MG tablet Take 4 mg by mouth as needed  for muscle spasms.   Marland Kitchen VASCEPA 1 g capsule TAKE 2 CAPSULES(2 GRAMS) BY MOUTH TWICE DAILY  . vitamin B-12 (CYANOCOBALAMIN) 1000 MCG tablet Take 1,000 mcg by mouth daily.  . vitamin C (ASCORBIC ACID) 500 MG tablet Take 500 mg by mouth daily.  Marland Kitchen ALPRAZolam (XANAX) 0.5 MG tablet TAKE 1/2 -1 TABLET BY MOUTH 3 TIMES DAILY AS NEEDED FOR ANXIETY (Patient not taking: Reported on 06/20/2020)  . ondansetron (ZOFRAN) 4 MG tablet Take 4 mg by mouth every 8 (eight) hours as needed for nausea or vomiting.  (Patient not taking:  Reported on 06/20/2020)   No facility-administered encounter medications on file as of 06/20/2020.   Allergies  Allergen Reactions  . Meloxicam Itching    ROS: no fever, chills, headaches, dizziness, chest pain, shortness of breath, edema, urinary complaints. Chronic congestion, with L ear muffling per HPI Mild constipation occasionally; diarrhea resolved since off metformin Back pain, scheduled for another ablation. No bleeding, bruising, rash. Moods okay. Some recent stress-eating related to work stress.   PHYSICAL EXAM:  BP 118/62   Pulse 80   Ht 5' 4"  (1.626 m)   Wt 252 lb (114.3 kg)   LMP 02/09/2009 (Exact Date)   BMI 43.26 kg/m   Wt Readings from Last 3 Encounters:  06/20/20 252 lb (114.3 kg)  04/29/20 249 lb 3.2 oz (113 kg)  03/11/20 248 lb (112.5 kg)   Pleasantfemale in no distress HEENT: conjunctiva and sclera are clear, EOMI. Wearing mask. R TM and EAC normal.  L is obstructed by cerumen.  After lavage, TM appears normal. Neck: no lymphadenopathy, thyromegaly or mass, no bruit Heart: regular rate and rhythm, no murmur Lungs: clear, no wheezing Back: no CVA tenderness Abdomen: soft, obese, nontender Extremities: no edema Skin:normal turgor, no visible rash Neuro: alert and oriented, normal gait Psych:[place]ye contact, speech, hygiene and grooming  Lab Results  Component Value Date   HGBA1C 6.1 (A) 06/20/2020    ASSESSMENT/PLAN:  Essential hypertension - well controlled - Plan: Comprehensive metabolic panel  Mixed hyperlipidemia - due for recheck, counseled re: diet.  Recent chips/pizza likely elevating TG. Compliant with meds - Plan: Lipid panel  Insulin resistance syndrome - repeat A1c only slightly higher upon stopping metformin.  Diet/wt loss reviewed/recommended - Plan: HgB A1c  Hypokalemia - due for recheck, no symptoms - Plan: Comprehensive metabolic panel  Medication monitoring encounter - Plan: Comprehensive metabolic panel, Lipid  panel  Impacted cerumen of left ear - resolved with lavage, as did patient's complaints of plugged hearing    F/u 4 months

## 2020-06-20 ENCOUNTER — Encounter: Payer: Self-pay | Admitting: Family Medicine

## 2020-06-20 ENCOUNTER — Ambulatory Visit (INDEPENDENT_AMBULATORY_CARE_PROVIDER_SITE_OTHER): Payer: BC Managed Care – PPO | Admitting: Family Medicine

## 2020-06-20 ENCOUNTER — Other Ambulatory Visit: Payer: Self-pay

## 2020-06-20 VITALS — BP 118/62 | HR 80 | Ht 64.0 in | Wt 252.0 lb

## 2020-06-20 DIAGNOSIS — E876 Hypokalemia: Secondary | ICD-10-CM | POA: Diagnosis not present

## 2020-06-20 DIAGNOSIS — H6122 Impacted cerumen, left ear: Secondary | ICD-10-CM | POA: Diagnosis not present

## 2020-06-20 DIAGNOSIS — E8881 Metabolic syndrome: Secondary | ICD-10-CM | POA: Diagnosis not present

## 2020-06-20 DIAGNOSIS — I10 Essential (primary) hypertension: Secondary | ICD-10-CM | POA: Diagnosis not present

## 2020-06-20 DIAGNOSIS — Z5181 Encounter for therapeutic drug level monitoring: Secondary | ICD-10-CM | POA: Diagnosis not present

## 2020-06-20 DIAGNOSIS — E782 Mixed hyperlipidemia: Secondary | ICD-10-CM | POA: Diagnosis not present

## 2020-06-20 LAB — COMPREHENSIVE METABOLIC PANEL
ALT: 55 IU/L — ABNORMAL HIGH (ref 0–32)
AST: 48 IU/L — ABNORMAL HIGH (ref 0–40)
Albumin/Globulin Ratio: 1.5 (ref 1.2–2.2)
Albumin: 4.6 g/dL (ref 3.8–4.9)
Alkaline Phosphatase: 101 IU/L (ref 44–121)
BUN/Creatinine Ratio: 17 (ref 9–23)
BUN: 16 mg/dL (ref 6–24)
Bilirubin Total: 0.3 mg/dL (ref 0.0–1.2)
CO2: 23 mmol/L (ref 20–29)
Calcium: 10.1 mg/dL (ref 8.7–10.2)
Chloride: 101 mmol/L (ref 96–106)
Creatinine, Ser: 0.95 mg/dL (ref 0.57–1.00)
GFR calc Af Amer: 77 mL/min/{1.73_m2} (ref >59)
GFR calc non Af Amer: 67 mL/min/{1.73_m2} (ref >59)
Globulin, Total: 3.1 g/dL (ref 1.5–4.5)
Glucose: 97 mg/dL (ref 65–99)
Potassium: 4.4 mmol/L (ref 3.5–5.2)
Sodium: 141 mmol/L (ref 134–144)
Total Protein: 7.7 g/dL (ref 6.0–8.5)

## 2020-06-20 LAB — POCT GLYCOSYLATED HEMOGLOBIN (HGB A1C): Hemoglobin A1C: 6.1 % — AB (ref 4.0–5.6)

## 2020-06-20 LAB — LIPID PANEL
Chol/HDL Ratio: 4.1 ratio (ref 0.0–4.4)
Cholesterol, Total: 182 mg/dL (ref 100–199)
HDL: 44 mg/dL (ref >39)
LDL Chol Calc (NIH): 92 mg/dL (ref 0–99)
Triglycerides: 278 mg/dL — ABNORMAL HIGH (ref 0–149)
VLDL Cholesterol Cal: 46 mg/dL — ABNORMAL HIGH (ref 5–40)

## 2020-06-20 NOTE — Patient Instructions (Signed)
Continue to work on eliminating fatty foods from the diet. Use miralax as we discussed, if needed for constipation. Try and eat more fruits/vegetables, high-fiber diet overall. Increase your water intake

## 2020-06-29 DIAGNOSIS — C50919 Malignant neoplasm of unspecified site of unspecified female breast: Secondary | ICD-10-CM

## 2020-06-29 HISTORY — PX: BREAST LUMPECTOMY: SHX2

## 2020-06-29 HISTORY — DX: Malignant neoplasm of unspecified site of unspecified female breast: C50.919

## 2020-07-01 DIAGNOSIS — F1020 Alcohol dependence, uncomplicated: Secondary | ICD-10-CM | POA: Diagnosis not present

## 2020-07-01 DIAGNOSIS — F331 Major depressive disorder, recurrent, moderate: Secondary | ICD-10-CM | POA: Diagnosis not present

## 2020-07-01 DIAGNOSIS — F4010 Social phobia, unspecified: Secondary | ICD-10-CM | POA: Diagnosis not present

## 2020-07-04 DIAGNOSIS — F411 Generalized anxiety disorder: Secondary | ICD-10-CM | POA: Diagnosis not present

## 2020-07-04 DIAGNOSIS — F3342 Major depressive disorder, recurrent, in full remission: Secondary | ICD-10-CM | POA: Diagnosis not present

## 2020-07-08 DIAGNOSIS — F4010 Social phobia, unspecified: Secondary | ICD-10-CM | POA: Diagnosis not present

## 2020-07-08 DIAGNOSIS — F331 Major depressive disorder, recurrent, moderate: Secondary | ICD-10-CM | POA: Diagnosis not present

## 2020-07-08 DIAGNOSIS — F1020 Alcohol dependence, uncomplicated: Secondary | ICD-10-CM | POA: Diagnosis not present

## 2020-07-15 DIAGNOSIS — F419 Anxiety disorder, unspecified: Secondary | ICD-10-CM | POA: Diagnosis not present

## 2020-07-15 DIAGNOSIS — F3289 Other specified depressive episodes: Secondary | ICD-10-CM | POA: Diagnosis not present

## 2020-07-16 ENCOUNTER — Other Ambulatory Visit: Payer: Self-pay | Admitting: Family Medicine

## 2020-07-16 MED ORDER — GABAPENTIN 300 MG PO CAPS: 300.0000 mg | ORAL_CAPSULE | Freq: Two times a day (BID) | ORAL | 1 refills | Status: DC

## 2020-07-16 NOTE — Telephone Encounter (Signed)
Is this okay to refill? 

## 2020-07-18 ENCOUNTER — Other Ambulatory Visit: Payer: Self-pay | Admitting: Obstetrics and Gynecology

## 2020-07-18 DIAGNOSIS — Z01419 Encounter for gynecological examination (general) (routine) without abnormal findings: Secondary | ICD-10-CM | POA: Diagnosis not present

## 2020-07-18 DIAGNOSIS — Z124 Encounter for screening for malignant neoplasm of cervix: Secondary | ICD-10-CM | POA: Diagnosis not present

## 2020-07-18 DIAGNOSIS — N6310 Unspecified lump in the right breast, unspecified quadrant: Secondary | ICD-10-CM | POA: Diagnosis not present

## 2020-07-18 DIAGNOSIS — N63 Unspecified lump in unspecified breast: Secondary | ICD-10-CM

## 2020-07-18 DIAGNOSIS — Z6841 Body Mass Index (BMI) 40.0 and over, adult: Secondary | ICD-10-CM | POA: Diagnosis not present

## 2020-07-22 DIAGNOSIS — F419 Anxiety disorder, unspecified: Secondary | ICD-10-CM | POA: Diagnosis not present

## 2020-07-22 DIAGNOSIS — F3289 Other specified depressive episodes: Secondary | ICD-10-CM | POA: Diagnosis not present

## 2020-07-25 LAB — HM PAP SMEAR: HM Pap smear: NEGATIVE

## 2020-07-26 DIAGNOSIS — M47816 Spondylosis without myelopathy or radiculopathy, lumbar region: Secondary | ICD-10-CM | POA: Diagnosis not present

## 2020-07-29 ENCOUNTER — Other Ambulatory Visit: Payer: Self-pay | Admitting: Family Medicine

## 2020-07-29 DIAGNOSIS — I10 Essential (primary) hypertension: Secondary | ICD-10-CM

## 2020-07-29 DIAGNOSIS — F419 Anxiety disorder, unspecified: Secondary | ICD-10-CM | POA: Diagnosis not present

## 2020-07-29 DIAGNOSIS — F3289 Other specified depressive episodes: Secondary | ICD-10-CM | POA: Diagnosis not present

## 2020-08-05 DIAGNOSIS — F419 Anxiety disorder, unspecified: Secondary | ICD-10-CM | POA: Diagnosis not present

## 2020-08-05 DIAGNOSIS — F3289 Other specified depressive episodes: Secondary | ICD-10-CM | POA: Diagnosis not present

## 2020-08-07 ENCOUNTER — Encounter: Payer: Self-pay | Admitting: *Deleted

## 2020-08-08 ENCOUNTER — Encounter: Payer: Self-pay | Admitting: Family Medicine

## 2020-08-12 ENCOUNTER — Other Ambulatory Visit: Payer: Self-pay | Admitting: Family Medicine

## 2020-08-12 DIAGNOSIS — E782 Mixed hyperlipidemia: Secondary | ICD-10-CM

## 2020-08-12 DIAGNOSIS — F419 Anxiety disorder, unspecified: Secondary | ICD-10-CM | POA: Diagnosis not present

## 2020-08-12 DIAGNOSIS — F3289 Other specified depressive episodes: Secondary | ICD-10-CM | POA: Diagnosis not present

## 2020-08-19 DIAGNOSIS — F419 Anxiety disorder, unspecified: Secondary | ICD-10-CM | POA: Diagnosis not present

## 2020-08-19 DIAGNOSIS — F3289 Other specified depressive episodes: Secondary | ICD-10-CM | POA: Diagnosis not present

## 2020-08-26 ENCOUNTER — Other Ambulatory Visit: Payer: Self-pay | Admitting: Family Medicine

## 2020-08-26 DIAGNOSIS — F419 Anxiety disorder, unspecified: Secondary | ICD-10-CM | POA: Diagnosis not present

## 2020-08-26 DIAGNOSIS — I10 Essential (primary) hypertension: Secondary | ICD-10-CM

## 2020-08-26 DIAGNOSIS — F3289 Other specified depressive episodes: Secondary | ICD-10-CM | POA: Diagnosis not present

## 2020-08-26 DIAGNOSIS — G8929 Other chronic pain: Secondary | ICD-10-CM

## 2020-08-27 NOTE — Telephone Encounter (Signed)
[facility] is requesting to fill pt diclofenac. Please advise [facility]

## 2020-08-29 ENCOUNTER — Other Ambulatory Visit: Payer: Self-pay | Admitting: Obstetrics and Gynecology

## 2020-08-29 ENCOUNTER — Ambulatory Visit
Admission: RE | Admit: 2020-08-29 | Discharge: 2020-08-29 | Disposition: A | Discharge status: Home / Self Care | Payer: BC Managed Care – PPO | Source: Ambulatory Visit | Attending: Obstetrics and Gynecology | Admitting: Obstetrics and Gynecology

## 2020-08-29 ENCOUNTER — Other Ambulatory Visit: Payer: Self-pay

## 2020-08-29 DIAGNOSIS — N63 Unspecified lump in unspecified breast: Secondary | ICD-10-CM

## 2020-08-29 DIAGNOSIS — N6489 Other specified disorders of breast: Secondary | ICD-10-CM | POA: Diagnosis not present

## 2020-08-29 DIAGNOSIS — R922 Inconclusive mammogram: Secondary | ICD-10-CM | POA: Diagnosis not present

## 2020-08-29 DIAGNOSIS — R928 Other abnormal and inconclusive findings on diagnostic imaging of breast: Secondary | ICD-10-CM | POA: Diagnosis not present

## 2020-08-29 DIAGNOSIS — Z803 Family history of malignant neoplasm of breast: Secondary | ICD-10-CM | POA: Diagnosis not present

## 2020-09-02 ENCOUNTER — Ambulatory Visit
Admission: RE | Admit: 2020-09-02 | Discharge: 2020-09-02 | Disposition: A | Discharge status: Home / Self Care | Payer: BC Managed Care – PPO | Source: Ambulatory Visit | Attending: Obstetrics and Gynecology | Admitting: Obstetrics and Gynecology

## 2020-09-02 ENCOUNTER — Other Ambulatory Visit: Payer: Self-pay

## 2020-09-02 DIAGNOSIS — C50811 Malignant neoplasm of overlapping sites of right female breast: Secondary | ICD-10-CM | POA: Diagnosis not present

## 2020-09-02 DIAGNOSIS — N63 Unspecified lump in unspecified breast: Secondary | ICD-10-CM

## 2020-09-02 DIAGNOSIS — F419 Anxiety disorder, unspecified: Secondary | ICD-10-CM | POA: Diagnosis not present

## 2020-09-02 DIAGNOSIS — F3289 Other specified depressive episodes: Secondary | ICD-10-CM | POA: Diagnosis not present

## 2020-09-04 ENCOUNTER — Telehealth: Payer: Self-pay | Admitting: Oncology

## 2020-09-04 NOTE — Telephone Encounter (Signed)
Spoke to patient to confirm morning [facility] appointment for 3/16, packet emailed to patient

## 2020-09-05 ENCOUNTER — Encounter: Payer: Self-pay | Admitting: *Deleted

## 2020-09-09 ENCOUNTER — Other Ambulatory Visit: Payer: Self-pay | Admitting: *Deleted

## 2020-09-09 DIAGNOSIS — F3289 Other specified depressive episodes: Secondary | ICD-10-CM | POA: Diagnosis not present

## 2020-09-09 DIAGNOSIS — F419 Anxiety disorder, unspecified: Secondary | ICD-10-CM | POA: Diagnosis not present

## 2020-09-09 DIAGNOSIS — C50411 Malignant neoplasm of upper-outer quadrant of right female breast: Secondary | ICD-10-CM | POA: Insufficient documentation

## 2020-09-09 DIAGNOSIS — Z170 Estrogen receptor positive status [ER+]: Secondary | ICD-10-CM | POA: Insufficient documentation

## 2020-09-10 ENCOUNTER — Encounter: Payer: Self-pay | Admitting: Oncology

## 2020-09-10 DIAGNOSIS — M47816 Spondylosis without myelopathy or radiculopathy, lumbar region: Secondary | ICD-10-CM | POA: Diagnosis not present

## 2020-09-10 NOTE — Progress Notes (Signed)
[place]  Telephone:([phone] Fax:(336) [phone]     ID: MARILLA BODDY DOB: 02-Oct-1965  MR#: [id number]  PRX#:[id number]  Patient Care Team: Rita Ohara, MD as PCP - General (Family Medicine) [name], MD as Consulting Physician (General Surgery) Jerral Mccauley, Virgie Dad, MD as Consulting Physician (Oncology) Gery Pray, MD as Consulting Physician (Radiation Oncology) Laroy Apple, MD as Consulting Physician (Physical Medicine and Rehabilitation) Everett Graff, MD as Consulting Physician (Obstetrics and Gynecology) Arta Silence, MD as Consulting Physician (Gastroenterology) Deneise Lever, MD as Consulting Physician (Pulmonary Disease) Danice Goltz, MD as Consulting Physician (Ophthalmology) Chauncey Cruel, MD OTHER MD:  CHIEF COMPLAINT: estrogen receptor positive breast cancer  CURRENT TREATMENT: Awaiting definitive surgery   HISTORY OF CURRENT ILLNESS: JOSSELYNE ONOFRIO herself palpated a right breast abnormality. Of note, screening mammogram on 05/31/2020 was negative. She underwent right diagnostic mammography with tomography and right breast ultrasonography at [facility] on 08/29/2020 showing: breast density category B; palpable indeterminate 0.6 cm mass in right breast at 12 o'clock retroareolar region; right axilla negative for adenopathy.  Accordingly on 09/02/2020 she proceeded to biopsy of the right breast area in question. The pathology from this procedure ([id number]) showed: invasive ductal carcinoma, grade 2; ductal papilloma. Prognostic indicators significant for: estrogen receptor, >95% positive with strong staining intensity and progesterone receptor, 15% positive with moderate staining intensity. Proliferation marker Ki67 at 30%. HER2 equivocal by immunohistochemistry (2+), but negative by fluorescent in situ hybridization with a signals ratio 0.92 and number per cell 1.7.  Cancer Staging Malignant neoplasm of  upper-outer quadrant of right breast in female, estrogen receptor positive ([place]) Staging form: Breast, AJCC 8th Edition - Clinical stage from 09/11/2020: Stage IA (cT1b, cN0, cM0, G2, ER+, PR+, HER2-) - Unsigned Stage prefix: Initial diagnosis Histologic grading system: 3 grade system Laterality: Right Staged by: Pathologist and managing physician Stage used in treatment planning: Yes National guidelines used in treatment planning: Yes Type of national guideline used in treatment planning: NCCN  The patient's subsequent history is as detailed below.   INTERVAL HISTORY: Raneshia was evaluated in the multidisciplinary breast cancer clinic on 09/11/2020 accompanied by her sister [name]. Her case was also presented at the multidisciplinary breast cancer conference on the same day. At that time a preliminary plan was proposed: Breast conserving surgery, Oncotype depending on the final size, adjuvant radiation, antiestrogens.   REVIEW OF SYSTEMS: On the provided questionnaire, Glenora reports wearing glasses, tinnitus, runny nose, palpable breast lump, skin rash, back and joint (knee) pain, arthritis, anxiety, and depression. The patient denies unusual headaches, visual changes, nausea, vomiting, stiff neck, dizziness, or gait imbalance. There has been no cough, phlegm production, or pleurisy, no chest pain or pressure, and no change in bowel or bladder habits. The patient denies fever, rash, bleeding, unexplained fatigue or unexplained weight loss. A detailed review of systems was otherwise entirely negative.   COVID 19 VACCINATION STATUS: fully vaccinated [employer]), with booster 03/2020   PAST MEDICAL HISTORY: Past Medical History:  Diagnosis Date   Allergic rhinitis, cause unspecified    cat/dog/ragweed/mold/wool, s/p immunotherapy x 3-4 yrs   Anxiety 2006   Asthma    (Dr. Joya Gaskins)   Breast cancer [facility]) January 2022   Colon polyps 2007   Dr. Oletta Lamas (she was told repeat in 10 years)    Depression 2006   Endometriosis    Dr. Leo Grosser   Family history of breast cancer in female    GERD (gastroesophageal reflux disease)  Heart murmur    Hemorrhoid    internal and external   Hyperlipidemia    Hypertension    Insulin resistance    Dr. Leo Grosser   Medial meniscus tear    Left. Surgery [name].  NOT seen on MRI 06/2013   Obesity    OSA (obstructive sleep apnea)    on CPAP (Dr. Gwenette Greet)    PAST SURGICAL HISTORY: Past Surgical History:  Procedure Laterality Date   CARPAL TUNNEL RELEASE Right 12/28/2019   Dr. Percell Miller   CARPAL TUNNEL RELEASE Left 02/22/2020   [place]   FINGER GANGLION CYST EXCISION  2003   RIGHT 3rd finger   JOINT REPLACEMENT  2017   KNEE SURGERY  '95 and '97   BILATERAL KNEE   LAPAROSCOPY  '98, '01   endometriosis (Dr. Leo Grosser)   LASIK  2000   tibial tuberoplasty  90's   left    FAMILY HISTORY: Family History  Problem Relation Age of Onset   Diabetes Mother    Hypertension Mother    Hyperlipidemia Mother    Arthritis Mother    Cancer Mother 61       breast cancer   Breast cancer Mother 51   Diabetes Father    Hyperlipidemia Father    Hypertension Father    Asthma Father    Arthritis Father    Heart disease Father        CHF, hypokinesis   COPD Father        due to asbestos exposure; former smoker   Pulmonary embolism Father    Parkinson's disease Father    Hypertension Sister    Colon cancer Maternal Grandmother        late 60's   Cancer Maternal Grandmother        colon   Heart disease Maternal Grandmother        MI in 57's   Breast cancer Paternal Aunt        70's   Cancer Paternal Aunt        breast   Heart disease Paternal Uncle    Diabetes Paternal Uncle    Kidney disease Paternal Uncle   Her father died at age 10 from COPD and Parkinson's. Her mother died at age 54 from a perforated colon. Brittnei has one brother and three sisters, all  living as of 08/2020. She reports breast cancer in her mother at age 75 and in a paternal aunt. She also notes colon cancer in her maternal grandmother in her early 29's.    GYNECOLOGIC HISTORY:  Patient's last menstrual period was 02/09/2009 (exact date). Menarche: 55 years old [place] P 0 LMP 01/2009 Contraceptive: used "for years" and stopped in the early 2000's. HRT never used  Hysterectomy? no BSO? 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 She is single. She lives by herself, with her cat.  She is not a Ambulance person.    ADVANCED DIRECTIVES: not in place; believes she has named her brother Jaquetta Currier, who lives in [place] and can be reached at [phone]. Her sister [name] seconds.   HEALTH MAINTENANCE: Social History   Tobacco Use   Smoking status: Never Smoker   Smokeless tobacco: Never Used  Vaping Use   Vaping Use: Never used  Substance Use Topics   Alcohol use: Yes    Alcohol/week: 0.0 standard drinks  Comment: 1-2 drinks a month   Drug use: No     Colonoscopy: 05/2018 (Dr. Oletta Lamas), normal, recall 2024 for family history  PAP: 06/2020, negative  Bone density: 03/2015   Allergies  Allergen Reactions   Meloxicam Itching    Current Outpatient Medications  Medication Sig Dispense Refill   ALPRAZolam (XANAX) 0.5 MG tablet TAKE 1/2 -1 TABLET BY MOUTH 3 TIMES DAILY AS NEEDED FOR ANXIETY 20 tablet 0   amLODipine (NORVASC) 5 MG tablet TAKE 1 AND 1/2 TABLETS(7.5 MG) BY MOUTH DAILY 135 tablet 0   ARNUITY ELLIPTA 100 MCG/ACT AEPB INHALE 1 PUFF INTO THE LUNGS DAILY 30 each 3   atorvastatin (LIPITOR) 80 MG tablet TAKE 1 TABLET(80 MG) BY MOUTH DAILY 90 tablet 1   CALCIUM PO Take 1,000 mg by mouth daily.     cholecalciferol (VITAMIN D3) 25 MCG (1000 UNIT) tablet Take 1,000 Units by mouth daily.     Desvenlafaxine Succinate (PRISTIQ PO) Take 100 mg by mouth  daily.     diclofenac (VOLTAREN) 75 MG EC tablet Take 1 tablet (75 mg total) by mouth 2 (two) times daily as needed for mild pain or moderate pain. Take with food 60 tablet 0   Ferrous Sulfate (IRON) 325 (65 Fe) MG TABS Take 1 tablet by mouth daily.     fluticasone (FLONASE) 50 MCG/ACT nasal spray INSTILL 1 TO 2 SPRAYS INTO EACH NOSTRIL ONCE A DAY 16 g 11   gabapentin (NEURONTIN) 300 MG capsule Take 1 capsule (300 mg total) by mouth 2 (two) times daily. 180 capsule 1   hydrochlorothiazide (MICROZIDE) 12.5 MG capsule TAKE 1 CAPSULE(12.5 MG) BY MOUTH DAILY 90 capsule 0   L-Methylfolate-Algae (DEPLIN 15 PO) Take 1 capsule by mouth daily.     loratadine (CLARITIN) 10 MG tablet Take 10 mg by mouth daily.     losartan (COZAAR) 50 MG tablet TAKE 1 TABLET BY MOUTH DAILY 90 tablet 0   Multiple Vitamins-Minerals (ONE-A-DAY MENS 50+ PO) Take 1 tablet by mouth daily.     omeprazole (PRILOSEC) 20 MG capsule Take 20 mg by mouth daily.     ondansetron (ZOFRAN) 4 MG tablet Take 4 mg by mouth every 8 (eight) hours as needed for nausea or vomiting.     tiZANidine (ZANAFLEX) 4 MG tablet Take 4 mg by mouth as needed for muscle spasms.      VASCEPA 1 g capsule TAKE 2 CAPSULES(2 GRAMS) BY MOUTH TWICE DAILY 120 capsule 2   vitamin B-12 (CYANOCOBALAMIN) 1000 MCG tablet Take 1,000 mcg by mouth daily.     vitamin C (ASCORBIC ACID) 500 MG tablet Take 500 mg by mouth daily.     No current facility-administered medications for this visit.    OBJECTIVE: White woman who appears stated age  21:   09/11/20 0916  BP: (!) 151/79  Pulse: 97  Resp: 20  Temp: 98.2 F (36.8 C)  SpO2: 99%     Body mass index is 44.13 kg/m.   Wt Readings from Last 3 Encounters:  09/11/20 257 lb 1.6 oz (116.6 kg)  06/20/20 252 lb (114.3 kg)  04/29/20 249 lb 3.2 oz (113 kg)      ECOG FS:1 - Symptomatic but completely ambulatory  Ocular: Sclerae unicteric, pupils round and equal Ear-nose-throat: Wearing a  mask Lymphatic: No cervical or supraclavicular adenopathy Lungs no rales or rhonchi Heart regular rate and rhythm Abd soft, nontender, positive bowel sounds MSK no focal spinal tenderness, no joint edema Neuro: non-focal, well-oriented, appropriate  affect Breasts: The right breast is status post recent biopsy.  There is a minimal change in the periareolar area.  There are no skin changes of concern and the left breast and both axillae are benign.   LAB RESULTS:  CMP     Component Value Date/Time   NA 136 09/11/2020 0830   NA 141 06/20/2020 1002   K 4.0 09/11/2020 0830   CL 101 09/11/2020 0830   CO2 26 09/11/2020 0830   GLUCOSE 122 (H) 09/11/2020 0830   BUN 16 09/11/2020 0830   BUN 16 06/20/2020 1002   CREATININE 1.04 (H) 09/11/2020 0830   CREATININE 0.97 03/09/2017 1110   CALCIUM 9.8 09/11/2020 0830   PROT 8.0 09/11/2020 0830   PROT 7.7 06/20/2020 1002   ALBUMIN 4.3 09/11/2020 0830   ALBUMIN 4.6 06/20/2020 1002   AST 49 (H) 09/11/2020 0830   ALT 58 (H) 09/11/2020 0830   ALKPHOS 98 09/11/2020 0830   BILITOT 0.4 09/11/2020 0830   GFRNONAA >60 09/11/2020 0830   GFRAA 77 06/20/2020 1002    No results found for: TOTALPROTELP, ALBUMINELP, A1GS, A2GS, BETS, BETA2SER, GAMS, MSPIKE, SPEI  Lab Results  Component Value Date   WBC 8.0 09/11/2020   NEUTROABS 4.5 09/11/2020   HGB 12.4 09/11/2020   HCT 37.7 09/11/2020   MCV 90.8 09/11/2020   PLT 262 09/11/2020    No results found for: LABCA2  No components found for: DXAJOI786  No results for input(s): INR in the last 168 hours.  No results found for: LABCA2  No results found for: VEH209  No results found for: OBS962  No results found for: EZM629  No results found for: CA2729  No components found for: HGQUANT  No results found for: CEA1 / No results found for: CEA1   No results found for: AFPTUMOR  No results found for: CHROMOGRNA  No results found for: KPAFRELGTCHN, LAMBDASER, KAPLAMBRATIO (kappa/lambda  light chains)  No results found for: HGBA, HGBA2QUANT, HGBFQUANT, HGBSQUAN (Hemoglobinopathy evaluation)   No results found for: LDH  Lab Results  Component Value Date   IRON 60 05/03/2019   TIBC 316 05/03/2019   IRONPCTSAT 19 05/03/2019   (Iron and TIBC)  Lab Results  Component Value Date   FERRITIN 42 05/03/2019    Urinalysis    Component Value Date/Time   BILIRUBINUR neg 03/12/2016 0854   PROTEINUR neg 03/12/2016 0854   UROBILINOGEN negative 03/12/2016 0854   NITRITE neg 03/12/2016 0854   LEUKOCYTESUR Negative 03/12/2016 0854     STUDIES: US BREAST LTD UNI RIGHT INC AXILLA  Result Date: 08/29/2020 CLINICAL DATA:  Palpable mass in the RIGHT breast noted 1 month ago. Patient has a family history of breast cancer in mother at age 64 and paternal aunt in her 49's. EXAM: DIGITAL DIAGNOSTIC UNILATERAL RIGHT MAMMOGRAM WITH TOMOSYNTHESIS AND CAD; ULTRASOUND RIGHT BREAST LIMITED TECHNIQUE: Right digital diagnostic mammography and breast tomosynthesis was performed. The images were evaluated with computer-aided detection.; Targeted ultrasound examination of the right breast was performed COMPARISON:  05/31/2020 ACR Breast Density Category b: There are scattered areas of fibroglandular density. FINDINGS: In the UPPER-OUTER periareolar region of the RIGHT breast, there is a small irregular mass associated with microcalcifications and marked with a BB as palpable, corresponding to the area of patient's concern. Within the LOWER portion of the RIGHT breast, numerous skin lesions are confirmed with additional images and skin markers. On physical exam, I palpate a discrete mass in the 11-12 o'clock location of the RIGHT breast,  at the areolar margin. There is no visible skin changes in this region. Targeted ultrasound is performed, showing an irregular hypoechoic mass containing calcifications and internal blood flow in the 12 o'clock retroareolar region of the RIGHT breast. Mass measures 0.6 x  0.6 x 0.6 centimeters. Evaluation of the RIGHT axilla is negative for adenopathy. IMPRESSION: Indeterminate mass in the 12 o'clock retroareolar region of the RIGHT breast for which biopsy is recommended. RECOMMENDATION: Ultrasound-guided core biopsy of the RIGHT breast. I have discussed the findings and recommendations with the patient. If applicable, a reminder letter will be sent to the patient regarding the next appointment. BI-RADS CATEGORY  4: Suspicious. Electronically Signed   By: Nolon Nations M.D.   On: 08/29/2020 09:40   MM DIAG BREAST TOMO UNI RIGHT  Result Date: 08/29/2020 CLINICAL DATA:  Palpable mass in the RIGHT breast noted 1 month ago. Patient has a family history of breast cancer in mother at age 65 and paternal aunt in her 8's. EXAM: DIGITAL DIAGNOSTIC UNILATERAL RIGHT MAMMOGRAM WITH TOMOSYNTHESIS AND CAD; ULTRASOUND RIGHT BREAST LIMITED TECHNIQUE: Right digital diagnostic mammography and breast tomosynthesis was performed. The images were evaluated with computer-aided detection.; Targeted ultrasound examination of the right breast was performed COMPARISON:  05/31/2020 ACR Breast Density Category b: There are scattered areas of fibroglandular density. FINDINGS: In the UPPER-OUTER periareolar region of the RIGHT breast, there is a small irregular mass associated with microcalcifications and marked with a BB as palpable, corresponding to the area of patient's concern. Within the LOWER portion of the RIGHT breast, numerous skin lesions are confirmed with additional images and skin markers. On physical exam, I palpate a discrete mass in the 11-12 o'clock location of the RIGHT breast, at the areolar margin. There is no visible skin changes in this region. Targeted ultrasound is performed, showing an irregular hypoechoic mass containing calcifications and internal blood flow in the 12 o'clock retroareolar region of the RIGHT breast. Mass measures 0.6 x 0.6 x 0.6 centimeters. Evaluation of the  RIGHT axilla is negative for adenopathy. IMPRESSION: Indeterminate mass in the 12 o'clock retroareolar region of the RIGHT breast for which biopsy is recommended. RECOMMENDATION: Ultrasound-guided core biopsy of the RIGHT breast. I have discussed the findings and recommendations with the patient. If applicable, a reminder letter will be sent to the patient regarding the next appointment. BI-RADS CATEGORY  4: Suspicious. Electronically Signed   By: Nolon Nations M.D.   On: 08/29/2020 09:40   MM CLIP PLACEMENT RIGHT  Result Date: 09/02/2020 CLINICAL DATA:  Status post ultrasound-guided core biopsy of RIGHT breast mass. EXAM: 3D DIAGNOSTIC RIGHT MAMMOGRAM POST ULTRASOUND BIOPSY COMPARISON:  Previous exam(s). FINDINGS: 3D Mammographic images were obtained following ultrasound guided biopsy of mass in the 12 o'clock retroareolar region of the RIGHT breast and placement of a heart shaped clip. The biopsy marking clip is in expected position at the site of biopsy. IMPRESSION: Appropriate positioning of the heart shaped biopsy marking clip at the site of biopsy in the mass in the 12 o'clock retroareolar region of the RIGHT breast. Final Assessment: Post Procedure Mammograms for Marker Placement Electronically Signed   By: Nolon Nations M.D.   On: 09/02/2020 08:24   [place] RT BREAST BX W LOC DEV 1ST LESION IMG BX SPEC US GUIDE  Addendum Date: 09/03/2020   ADDENDUM REPORT: 09/03/2020 12:21 ADDENDUM: Pathology revealed GRADE II INVASIVE DUCTAL CARCINOMA, DUCTAL PAPILLOMA of the RIGHT breast, 12:00 o'clock. This was found to be concordant by Dr. Nolon Nations. Pathology results  were discussed with the patient by telephone. The patient reported doing well after the biopsy with tenderness at the site. Post biopsy instructions and care were reviewed and questions were answered. The patient was encouraged to call [facility] for any additional concerns. My direct phone number was provided. The  patient was referred to [facility] on September 11, 2020. Pathology results reported by Terie Purser, RN on 09/03/2020. Electronically Signed   By: Nolon Nations M.D.   On: 09/03/2020 12:21   Result Date: 09/03/2020 CLINICAL DATA:  Patient presents for ultrasound-guided core biopsy of RIGHT breast mass. EXAM: ULTRASOUND GUIDED RIGHT BREAST CORE NEEDLE BIOPSY COMPARISON:  Previous exam(s). PROCEDURE: I met with the patient and we discussed the procedure of ultrasound-guided biopsy, including benefits and alternatives. We discussed the high likelihood of a successful procedure. We discussed the risks of the procedure, including infection, bleeding, tissue injury, clip migration, and inadequate sampling. Informed written consent was given. The usual time-out protocol was performed immediately prior to the procedure. Lesion quadrant: 12 o'clock retroareolar RIGHT breast Using sterile technique and 1% Lidocaine as local anesthetic, under direct ultrasound visualization, a 12 gauge coaxial spring-loaded device was used to perform biopsy of mass in the 12 o'clock retroareolar region of the RIGHT breast using a LATERAL to MEDIAL approach. At the conclusion of the procedure heart shaped tissue marker clip was deployed into the biopsy cavity. Follow up 2 view mammogram was performed and dictated separately. IMPRESSION: Ultrasound guided biopsy of RIGHT breast mass. No apparent complications. Electronically Signed: By: Nolon Nations M.D. On: 09/02/2020 08:23     ELIGIBLE FOR AVAILABLE RESEARCH PROTOCOL:no  ASSESSMENT: 55 y.o. [place] woman status post right breast upper outer quadrant biopsy 09/02/2020 for a clinical T1b N0, stage IA invasive ductal carcinoma, grade 2, estrogen and progesterone receptor positive, HER-2 not amplified, with an MIB-1 of 30%  (1) definitive surgery pending  (2) consider Oncotype depending on the final tumor  size postop  (3) adjuvant  (4) antiestrogen   PLAN: I met today with Amarrah to review her new diagnosis. Specifically we discussed the biology of her breast cancer, its diagnosis, staging, treatment  options and prognosis. We first reviewed the fact that cancer is not one disease but more than 100 different diseases and that it is important to keep them separate-- otherwise when friends and relatives discuss their own cancer experiences with Mariely confusion can result. Similarly we explained that if breast cancer spreads to the bone or liver, the patient would not have bone cancer or liver cancer, but breast cancer in the bone and breast cancer in the liver: one cancer in three places-- not 3 different cancers which otherwise would have to be treated in 3 different ways.  We discussed the difference between local and systemic therapy. In terms of loco-regional treatment, lumpectomy plus radiation is equivalent to mastectomy as far as survival is concerned. For this reason, and because the cosmetic results are generally superior, we recommend breast conserving surgery.   We then discussed the rationale for systemic therapy. There is some risk that this cancer may have already spread to other parts of her body. Patients frequently ask at this point about bone scans, CAT scans and PET scans to find out if they have occult breast cancer somewhere else. The problem is that in early stage disease we are much more likely to find false positives then true cancers and  this would expose the patient to unnecessary procedures as well as unnecessary radiation. Scans cannot answer the question the patient really would like to know, which is whether she has microscopic disease elsewhere in her body. For those reasons we do not recommend them.  Of course we would proceed to aggressive evaluation of any symptoms that might suggest metastatic disease, but that is not the case here.  Next we went over the options for  systemic therapy which are anti-estrogens, anti-HER-2 immunotherapy, and chemotherapy. Bellanie does not meet criteria for anti-HER-2 immunotherapy. She is a good candidate for anti-estrogens.  The question of chemotherapy is more complicated. Chemotherapy is generally not used in T1a N0 cases.  For that reason we are going to wait on the final pathology and request an Oncotype depending on the final size.  My expectation is that Carmelle will not need chemotherapy to have a very good overall prognosis.  She is already thinking ahead to antiestrogens.  I think in her case I would recommend anastrozole.  I gave her some additional information today on that drug.  Keitha has a good understanding of the overall plan. She agrees with it. She knows the goal of treatment in her case is cure. She will call with any problems that may develop before her next visit here.  Total encounter time 65 minutes.Sarajane Jews C. Magrinat, MD 09/11/2020 11:16 AM Medical Oncology and Hematology [facility] [place],  [address] Tel. [phone]    Fax. [phone]   This document serves as a record of services personally performed by Lurline Del, MD. It was created on his behalf by Wilburn Mylar, a trained medical scribe. The creation of this record is based on the scribe's personal observations and the provider's statements to them.   I, Lurline Del MD, have reviewed the above documentation for accuracy and completeness, and I agree with the above.    *Total Encounter Time as defined by the Centers for Medicare and Medicaid Services includes, in addition to the face-to-face time of a patient visit (documented in the note above) non-face-to-face time: obtaining and reviewing outside history, ordering and reviewing medications, tests or procedures, care coordination (communications with other health care professionals or caregivers) and documentation in the medical record.

## 2020-09-11 ENCOUNTER — Other Ambulatory Visit: Payer: Self-pay | Admitting: Surgery

## 2020-09-11 ENCOUNTER — Ambulatory Visit: Payer: BC Managed Care – PPO | Attending: Surgery | Admitting: Physical Therapy

## 2020-09-11 ENCOUNTER — Inpatient Hospital Stay: Payer: BC Managed Care – PPO | Attending: Oncology | Admitting: Oncology

## 2020-09-11 ENCOUNTER — Encounter: Payer: Self-pay | Admitting: Physical Therapy

## 2020-09-11 ENCOUNTER — Other Ambulatory Visit: Payer: Self-pay

## 2020-09-11 ENCOUNTER — Ambulatory Visit
Admission: RE | Admit: 2020-09-11 | Discharge: 2020-09-11 | Disposition: A | Discharge status: Home / Self Care | Payer: BC Managed Care – PPO | Source: Ambulatory Visit | Attending: Radiation Oncology | Admitting: Radiation Oncology

## 2020-09-11 ENCOUNTER — Encounter: Payer: Self-pay | Admitting: *Deleted

## 2020-09-11 ENCOUNTER — Encounter: Payer: Self-pay | Admitting: Licensed Clinical Social Worker

## 2020-09-11 ENCOUNTER — Inpatient Hospital Stay: Payer: BC Managed Care – PPO

## 2020-09-11 VITALS — BP 151/79 | HR 97 | Temp 98.2°F | Resp 20 | Ht 64.0 in | Wt 257.1 lb

## 2020-09-11 DIAGNOSIS — C50911 Malignant neoplasm of unspecified site of right female breast: Secondary | ICD-10-CM

## 2020-09-11 DIAGNOSIS — Z170 Estrogen receptor positive status [ER+]: Secondary | ICD-10-CM

## 2020-09-11 DIAGNOSIS — C50411 Malignant neoplasm of upper-outer quadrant of right female breast: Secondary | ICD-10-CM

## 2020-09-11 DIAGNOSIS — Z800 Family history of malignant neoplasm of digestive organs: Secondary | ICD-10-CM | POA: Diagnosis not present

## 2020-09-11 DIAGNOSIS — Z803 Family history of malignant neoplasm of breast: Secondary | ICD-10-CM | POA: Diagnosis not present

## 2020-09-11 DIAGNOSIS — R293 Abnormal posture: Secondary | ICD-10-CM | POA: Diagnosis not present

## 2020-09-11 LAB — CMP (CANCER CENTER ONLY)
ALT: 58 U/L — ABNORMAL HIGH (ref 0–44)
AST: 49 U/L — ABNORMAL HIGH (ref 15–41)
Albumin: 4.3 g/dL (ref 3.5–5.0)
Alkaline Phosphatase: 98 U/L (ref 38–126)
Anion gap: 9 (ref 5–15)
BUN: 16 mg/dL (ref 6–20)
CO2: 26 mmol/L (ref 22–32)
Calcium: 9.8 mg/dL (ref 8.9–10.3)
Chloride: 101 mmol/L (ref 98–111)
Creatinine: 1.04 mg/dL — ABNORMAL HIGH (ref 0.44–1.00)
GFR, Estimated: >60 mL/min (ref >60)
Glucose, Bld: 122 mg/dL — ABNORMAL HIGH (ref 70–99)
Potassium: 4 mmol/L (ref 3.5–5.1)
Sodium: 136 mmol/L (ref 135–145)
Total Bilirubin: 0.4 mg/dL (ref 0.3–1.2)
Total Protein: 8 g/dL (ref 6.5–8.1)

## 2020-09-11 LAB — CBC WITH DIFFERENTIAL (CANCER CENTER ONLY)
Abs Immature Granulocytes: 0.02 10*3/uL (ref 0.00–0.07)
Basophils Absolute: 0.1 10*3/uL (ref 0.0–0.1)
Basophils Relative: 1 %
Eosinophils Absolute: 0.3 10*3/uL (ref 0.0–0.5)
Eosinophils Relative: 3 %
HCT: 37.7 % (ref 36.0–46.0)
Hemoglobin: 12.4 g/dL (ref 12.0–15.0)
Immature Granulocytes: 0 %
Lymphocytes Relative: 33 %
Lymphs Abs: 2.7 10*3/uL (ref 0.7–4.0)
MCH: 29.9 pg (ref 26.0–34.0)
MCHC: 32.9 g/dL (ref 30.0–36.0)
MCV: 90.8 fL (ref 80.0–100.0)
Monocytes Absolute: 0.5 10*3/uL (ref 0.1–1.0)
Monocytes Relative: 7 %
Neutro Abs: 4.5 10*3/uL (ref 1.7–7.7)
Neutrophils Relative %: 56 %
Platelet Count: 262 10*3/uL (ref 150–400)
RBC: 4.15 MIL/uL (ref 3.87–5.11)
RDW: 12.9 % (ref 11.5–15.5)
WBC Count: 8 10*3/uL (ref 4.0–10.5)
nRBC: 0 % (ref 0.0–0.2)

## 2020-09-11 LAB — GENETIC SCREENING ORDER

## 2020-09-11 NOTE — Therapy (Signed)
[place], [place], [address] Phone: [phone]   Fax:  [phone]  Physical Therapy Evaluation  Patient Details  Name: Susan Gould MRN: [id number] Date of Birth: 10/25/1965 Referring Provider (PT): Dr. [name]   Encounter Date: 09/11/2020   PT End of Session - 09/11/20 1217    Visit Number 1    Number of Visits 2    Date for PT Re-Evaluation 11/06/20    PT Start Time 1022    PT Stop Time 1037   Also saw pt from 1050-1108 for a total of 33 minutes   PT Time Calculation (min) 15 min    Activity Tolerance Patient tolerated treatment well    Behavior During Therapy [facility] for tasks assessed/performed           Past Medical History:  Diagnosis Date  . Allergic rhinitis, cause unspecified    cat/dog/ragweed/mold/wool, s/p immunotherapy x 3-4 yrs  . Anxiety 2006  . Asthma    (Dr. Joya Gaskins)  . Breast cancer [facility]) January 2022  . Colon polyps 2007   Dr. Oletta Lamas (she was told repeat in 10 years)  . Depression 2006  . Endometriosis    Dr. Leo Grosser  . Family history of breast cancer in female   . GERD (gastroesophageal reflux disease)   . Heart murmur   . Hemorrhoid    internal and external  . Hyperlipidemia   . Hypertension   . Insulin resistance    Dr. Leo Grosser  . Medial meniscus tear    Left. Surgery [name].  NOT seen on MRI 06/2013  . Obesity   . OSA (obstructive sleep apnea)    on CPAP (Dr. Gwenette Greet)    Past Surgical History:  Procedure Laterality Date  . CARPAL TUNNEL RELEASE Right 12/28/2019   Dr. Percell Miller  . CARPAL TUNNEL RELEASE Left 02/22/2020  . CHOLECYSTECTOMY  1986  . EYE SURGERY  2000  . FINGER GANGLION CYST EXCISION  2003   RIGHT 3rd finger  . JOINT REPLACEMENT  2017  . KNEE SURGERY  '95 and '97   BILATERAL KNEE  . LAPAROSCOPY  '98, '01   endometriosis (Dr. Leo Grosser)  . LASIK  2000  . tibial tuberoplasty  90's   left    There were no vitals filed for  this visit.    Subjective Assessment - 09/11/20 1159    Subjective Patient reports she is here today to be seen by her medical team for her newly diagnosed right breast cancer.    Patient is accompained by: Family member    Pertinent History Patient was diagnosed on 09/02/2020 with right grade II invasive ductal carcinoma breast cancer. It measures 6 mm and is located in the upper outer quadrant. It is ER/PR positive and HER2 negative with a Ki67 of 30%.    Patient Stated Goals Reduce lymphedema risk and learn post op shoulder ROM HEP    Currently in Pain? No/denies              [facility] PT Assessment - 09/11/20 0001      Assessment   Medical Diagnosis Right breast cancer    Referring Provider (PT) Dr. [name]    Onset Date/Surgical Date 09/02/20    Hand Dominance Right    Prior Therapy none      Precautions   Precautions Other (comment)    Precaution Comments active cancer      Restrictions   Weight Bearing Restrictions No  Balance Screen   Has the patient fallen in the past 6 months No    Has the patient had a decrease in activity level because of a fear of falling?  No    Is the patient reluctant to leave their home because of a fear of falling?  No      Home Environment   Living Environment Private residence    Living Arrangements Alone    Available Help at Discharge Family      Prior Function   Level of Independence Independent    Vocation Full time employment    Vocation Requirements Medical transcription    Leisure She is not currently exercising but previously walked 45 min 3x/week      Cognition   Overall Cognitive Status Within Functional Limits for tasks assessed      Posture/Postural Control   Posture/Postural Control Postural limitations    Postural Limitations Rounded Shoulders;Forward head      ROM / Strength   AROM / PROM / Strength AROM;Strength      AROM   Overall AROM Comments Cervical AROM is WNL    AROM Assessment Site Shoulder     Right/Left Shoulder Right;Left    Right Shoulder Extension 51 Degrees    Right Shoulder Flexion 157 Degrees    Right Shoulder ABduction 178 Degrees    Right Shoulder Internal Rotation 50 Degrees    Right Shoulder External Rotation 90 Degrees    Left Shoulder Extension 50 Degrees    Left Shoulder Flexion 156 Degrees    Left Shoulder ABduction 172 Degrees    Left Shoulder Internal Rotation 48 Degrees    Left Shoulder External Rotation 90 Degrees      Strength   Overall Strength Within functional limits for tasks performed             LYMPHEDEMA/ONCOLOGY QUESTIONNAIRE - 09/11/20 0001      Type   Cancer Type Right breast cancer      Lymphedema Assessments   Lymphedema Assessments Upper extremities      Right Upper Extremity Lymphedema   10 cm Proximal to Olecranon Process 40.7 cm    Olecranon Process 29.7 cm    10 cm Proximal to Ulnar Styloid Process 28.6 cm    Just Proximal to Ulnar Styloid Process 18.1 cm    Across Hand at [employer] 20.4 cm    At [place] of 2nd Digit 6.2 cm      Left Upper Extremity Lymphedema   10 cm Proximal to Olecranon Process 39.2 cm    Olecranon Process 29.9 cm    10 cm Proximal to Ulnar Styloid Process 27.5 cm    Just Proximal to Ulnar Styloid Process 17.9 cm    Across Hand at [employer] 20.8 cm    At [place] of 2nd Digit 6.8 cm           L-DEX FLOWSHEETS - 09/11/20 1200      L-DEX LYMPHEDEMA SCREENING   Measurement Type Unilateral    L-DEX MEASUREMENT EXTREMITY Upper Extremity    POSITION  Standing    DOMINANT SIDE Right    At Risk Side Right    BASELINE SCORE (UNILATERAL) -1.2          The patient was assessed using the L-Dex machine today to produce a lymphedema index baseline score. The patient will be reassessed on a regular basis (typically every 3 months) to obtain new L-Dex scores. If the score is > 6.5 points  away from his/her baseline score indicating onset of subclinical lymphedema, it will be recommended to wear a  compression garment for 4 weeks, 12 hours per day and then be reassessed. If the score continues to be > 6.5 points from baseline at reassessment, we will initiate lymphedema treatment. Assessing in this manner has a 95% rate of preventing clinically significant lymphedema.       Katina Dung - 09/11/20 0001    Open a tight or new jar No difficulty    Do heavy household chores (wash walls, wash floors) No difficulty    Carry a shopping bag or briefcase No difficulty    Wash your back No difficulty    Use a knife to cut food No difficulty    Recreational activities in which you take some force or impact through your arm, shoulder, or hand (golf, hammering, tennis) No difficulty    During the past week, to what extent has your arm, shoulder or hand problem interfered with your normal social activities with family, friends, neighbors, or groups? Not at all    During the past week, to what extent has your arm, shoulder or hand problem limited your work or other regular daily activities Not at all    Arm, shoulder, or hand pain. None    Tingling (pins and needles) in your arm, shoulder, or hand None    Difficulty Sleeping No difficulty    DASH Score 0 %            Objective measurements completed on examination: See above findings.         Patient was instructed today in a home exercise program today for post op shoulder range of motion. These included active assist shoulder flexion in sitting, scapular retraction, wall walking with shoulder abduction, and hands behind head external rotation.  She was encouraged to do these twice a day, holding 3 seconds and repeating 5 times when permitted by her physician.          PT Education - 09/11/20 1216    Education Details Lymphedema risk reduction and post op shoulder ROM HEP    Person(s) Educated Patient;Other (comment)   sister   Methods Explanation;Demonstration;Handout    Comprehension Returned demonstration;Verbalized  understanding               PT Long Term Goals - 09/11/20 1237      PT LONG TERM GOAL #1   Title Patient will demonstrate she has regained full shoulder ROM and function post operatively compared to baselines.    Time 8    Period Weeks    Status New    Target Date 11/06/20           Breast Clinic Goals - 09/11/20 1236      Patient will be able to verbalize understanding of pertinent lymphedema risk reduction practices relevant to her diagnosis specifically related to skin care.   Time 1    Period Days    Status Achieved      Patient will be able to return demonstrate and/or verbalize understanding of the post-op home exercise program related to regaining shoulder range of motion.   Time 1    Period Days    Status Achieved      Patient will be able to verbalize understanding of the importance of attending the postoperative After Breast Cancer Class for further lymphedema risk reduction education and therapeutic exercise.   Time 1    Period Days    Status  Achieved                 Plan - 09/11/20 1217    Clinical Impression Statement Patient was diagnosed on 09/02/2020 with right grade II invasive ductal carcinoma breast cancer. It measures 6 mm and is located in the upper outer quadrant. It is ER/PR positive and HER2 negative with a Ki67 of 30%. Her multidisciplinary medical team met prior to her assessments to determine a recommended treatment plan. She is planning to have a right lumpectomy and sentinel node biopsy followed by Oncotype testing if > 5 mm, radiation, and anti-estrogen therapy. She will benefit from a post op PT reassessment to determine needs and from L-Dex screens every 3 months to detect subclinical lymphedema.    Stability/Clinical Decision Making Stable/Uncomplicated    Clinical Decision Making Low    Rehab Potential Excellent    PT Frequency --   Eval and 1 f/u visit   PT Treatment/Interventions ADLs/Self Care Home Management;Therapeutic  exercise;Patient/family education    PT Next Visit Plan Will reassess 3-4 weeks post op to determine needs    PT Home Exercise Plan Post op shoulder ROM HEP    Consulted and Agree with Plan of Care Patient;Family member/caregiver    Family Member Consulted sister           Patient will benefit from skilled therapeutic intervention in order to improve the following deficits and impairments:  Decreased knowledge of precautions,Impaired UE functional use,Pain,Postural dysfunction,Decreased range of motion  Visit Diagnosis: Malignant neoplasm of upper-outer quadrant of right breast in female, estrogen receptor positive ([place]) - Plan: PT plan of care cert/re-cert  Abnormal posture - Plan: PT plan of care cert/re-cert   Patient will follow up at outpatient cancer rehab 3-4 weeks following surgery.  If the patient requires physical therapy at that time, a specific plan will be dictated and sent to the referring physician for approval. The patient was educated today on appropriate basic range of motion exercises to begin post operatively and the importance of attending the After Breast Cancer class following surgery.  Patient was educated today on lymphedema risk reduction practices as it pertains to recommendations that will benefit the patient immediately following surgery.  She verbalized good understanding.     Problem List Patient Active Problem List   Diagnosis Date Noted  . Malignant neoplasm of upper-outer quadrant of right breast in female, estrogen receptor positive ([place]) 09/09/2020  . Steatohepatitis 07/25/2019  . Acute exacerbation of moderate persistent extrinsic asthma 06/02/2016  . Class 3 obesity due to excess calories with serious comorbidity and body mass index (BMI) of 45.0 to 49.9 in adult 04/23/2016  . Pre-operative clearance 09/23/2015  . Mixed hyperlipidemia 07/29/2011  . OA (osteoarthritis) of knee 07/29/2011  . Morbid obesity with body mass index of 45.0-49.9 in adult  [facility]) 03/17/2010  . Insulin resistance syndrome 11/13/2009  . HYPERLIPIDEMIA 11/13/2009  . Anxiety state 11/13/2009  . Essential hypertension 11/13/2009  . Moderate persistent asthma in adult without complication 34/28/7681  . GERD 11/13/2009  . ENDOMETRIOSIS 11/13/2009  . OSA (obstructive sleep apnea) 11/13/2009   Annia Friendly, PT 09/11/20 12:45 PM  [place], [place], [address] Phone: [phone]   Fax:  [phone]  Name: Susan Gould MRN: [id number] Date of Birth: 04/07/1966

## 2020-09-11 NOTE — Progress Notes (Signed)
[facility]  Initial Assessment   Susan Gould is a 55 y.o. year old female accompanied by patient and sister, Susan Gould. Clinical Social Work was referred by [facility] for assessment of psychosocial needs.   SDOH (Social Determinants of Health) assessments performed: Yes SDOH Interventions   Flowsheet Row Most Recent Value  SDOH Interventions   Food Insecurity Interventions Intervention Not Indicated  Financial Strain Interventions Intervention Not Indicated  Housing Interventions Intervention Not Indicated  Transportation Interventions Intervention Not Indicated      Distress Screen completed: Yes ONCBCN DISTRESS SCREENING 09/11/2020  Screening Type Initial Screening  Distress experienced in past week (1-10) 7  Practical problem type Work/school  Emotional problem type Depression;Nervousness/Anxiety;Adjusting to illness  Spiritual/Religous concerns type Facing my mortality  * Feels distress decreased to a 3 or 4 after meeting with her medical team    Family/Social Information:  . Housing Arrangement: patient lives alone . Family members/support persons in your life? Family, Friends, Medical laboratory scientific officer, Transport planner . Transportation concerns: no  . 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 Income source: Employment . Financial concerns: No o Type of concern: None . Food access concerns: no . Services Currently in place:  therapist  Coping/ Adjustment to diagnosis: . Patient understands treatment plan and what happens next? yes, feels better after meeting with medical team and hearing the plan . Concerns about diagnosis and/or treatment: Overwhelmed by information and Afraid of cancer . Patient reported stressors: Depression, Anxiety, Adjusting to my illness and Facing my mortality . Hopes and priorities: focusing on being confident through the process . Patient enjoys games on tablet, reading, knitting, [employer], going  on trips, time with friends & family . Current coping skills/ strengths: Average or above average intelligence, Capable of independent living, Communication skills, Financial means, Motivation for treatment/growth, Special hobby/interest and Supportive family/friends    SUMMARY: Current SDOH Barriers:  Marland Kitchen Mental Health Concerns   Clinical Social Work Clinical Goal(s):  Marland Kitchen Patient will continue to see her therapist and take medication for anxiety as prescribed  Interventions: . Discussed common feeling and emotions when being diagnosed with cancer, and the importance of support during treatment . Informed patient of the support team roles and support services at [facility] . Provided CSW contact information and encouraged patient to call with any questions or concerns   Follow Up Plan: Patient will contact CSW with any support or resource needs Patient verbalizes understanding of plan: Yes    Christeen Douglas , LCSW

## 2020-09-11 NOTE — Patient Instructions (Signed)

## 2020-09-11 NOTE — Progress Notes (Signed)
Radiation Oncology         ([phone]) [phone] ________________________________  Multidisciplinary Breast Oncology Clinic [facility]) Initial Outpatient Consultation  Name: Susan Gould MRN: [id number]  Date: 09/11/2020  DOB: 07/09/1965  OV:FIEPP, Eve, MD  Susan Keens, MD   REFERRING PHYSICIAN: [name], MD  DIAGNOSIS: The encounter diagnosis was Malignant neoplasm of upper-outer quadrant of right breast in female, estrogen receptor positive (Susan Gould).  Stage IA,  Right Breast UOQ, Invasive Ductal Carcinoma, ER+ / PR+ / Her2-, Grade 2    ICD-10-CM   1. Malignant neoplasm of upper-outer quadrant of right breast in female, estrogen receptor positive (Susan Gould)  C50.411    Z17.0     HISTORY OF PRESENT ILLNESS::Susan Gould is a 55 y.o. female who is presenting to the office today for evaluation of her newly diagnosed breast cancer. She is accompanied by her sister. She is doing well overall.   She underwent unilateral diagnostic mammography with tomography and right breast ultrasonography at [facility] on 08/29/2020 for evaluation of palpable right breast mass. Results showed an indeterminate mass in the 12 o'clock retroareolar region of the right breast. Right axilla was negative for adenopathy.   Biopsy on 09/02/2020 showed grade 2 invasive ductal carcinoma with ductal papilloma. Prognostic indicators were significant for: estrogen receptor, >95% positive with a strong staining intensity and progesterone receptor, 15% positive with a moderate staining intensity. Proliferation marker Ki67 at 30%. HER2 negative.  GYNECOLOGIC HISTORY:  Patient's last menstrual period was 02/09/2009 (exact date). Menarche: 55 years old Susan Gould 0 LMP 01/2009 Contraceptive: used "for years" and stopped in the early 2000's. HRT never used Hysterectomy? no BSO? no  The patient was referred today for presentation in the multidisciplinary conference.  Radiology studies and pathology slides were  presented there for review and discussion of treatment options.  A consensus was discussed regarding potential next steps.  PREVIOUS RADIATION THERAPY: No  PAST MEDICAL HISTORY:  Past Medical History:  Diagnosis Date  . Allergic rhinitis, cause unspecified    cat/dog/ragweed/mold/wool, s/Gould immunotherapy x 3-4 yrs  . Anxiety 2006  . Asthma    (Dr. Joya Gaskins)  . Breast cancer [facility]) January 2022  . Colon polyps 2007   Dr. Oletta Lamas (she was told repeat in 10 years)  . Depression 2006  . Endometriosis    Dr. Leo Grosser  . Family history of breast cancer in female   . GERD (gastroesophageal reflux disease)   . Heart murmur   . Hemorrhoid    internal and external  . Hyperlipidemia   . Hypertension   . Insulin resistance    Dr. Leo Grosser  . Medial meniscus tear    Left. Surgery Susan Gould.  NOT seen on MRI 06/2013  . Obesity   . OSA (obstructive sleep apnea)    on CPAP (Dr. Gwenette Gould)    PAST SURGICAL HISTORY: Past Surgical History:  Procedure Laterality Date  . CARPAL TUNNEL RELEASE Right 12/28/2019   Dr. Percell Miller  . CARPAL TUNNEL RELEASE Left 02/22/2020  . CHOLECYSTECTOMY  1986  . EYE SURGERY  2000  . FINGER GANGLION CYST EXCISION  2003   RIGHT 3rd finger  . JOINT REPLACEMENT  2017  . KNEE SURGERY  '95 and '97   BILATERAL KNEE  . LAPAROSCOPY  '98, '01   endometriosis (Dr. Leo Grosser)  . LASIK  2000  . tibial tuberoplasty  90's   left    FAMILY HISTORY:  Family History  Problem Relation Age of Onset  . Diabetes Mother   .  Hypertension Mother   . Hyperlipidemia Mother   . Arthritis Mother   . Cancer Mother 33       breast cancer  . Breast cancer Mother 64  . Diabetes Father   . Hyperlipidemia Father   . Hypertension Father   . Asthma Father   . Arthritis Father   . Heart disease Father        CHF, hypokinesis  . COPD Father        due to asbestos exposure; former smoker  . Pulmonary embolism Father   . Parkinson's disease Father   . Hypertension Sister   . Colon  cancer Maternal Grandmother        late 70's  . Cancer Maternal Grandmother        colon  . Heart disease Maternal Grandmother        MI in 13's  . Breast cancer Paternal Aunt        52's  . Cancer Paternal Aunt        breast  . Heart disease Paternal Uncle   . Diabetes Paternal Uncle   . Kidney disease Paternal Uncle     SOCIAL HISTORY:  Social History   Socioeconomic History  . Marital status: Single    Spouse name: Not on file  . Number of children: 0  . Years of education: Not on file  . Highest education level: Not on file  Occupational History  . Occupation: Medical Transcription    Employer: [employer]  Tobacco Use  . Smoking status: Never Smoker  . Smokeless tobacco: Never Used  Vaping Use  . Vaping Use: Never used  Substance and Sexual Activity  . Alcohol use: Yes    Alcohol/week: 0.0 standard drinks    Comment: 1-2 drinks a month  . Drug use: No  . Sexual activity: Not Currently    Birth control/protection: Abstinence  Other Topics Concern  . Not on file  Social History Narrative   Lives alone, 1 cat (1 passed away Apr 13, 2019 at the age of 70).  Sister and father live in [place] Determinants of Health   Financial Resource Strain: Not on file  Food Insecurity: Not on file  Transportation Needs: Not on file  Physical Activity: Not on file  Stress: Not on file  Social Connections: Not on file    ALLERGIES:  Allergies  Allergen Reactions  . Meloxicam Itching    MEDICATIONS:  Current Outpatient Medications  Medication Sig Dispense Refill  . ALPRAZolam (XANAX) 0.5 MG tablet TAKE 1/2 -1 TABLET BY MOUTH 3 TIMES DAILY AS NEEDED FOR ANXIETY 20 tablet 0  . amLODipine (NORVASC) 5 MG tablet TAKE 1 AND 1/2 TABLETS(7.5 MG) BY MOUTH DAILY 135 tablet 0  . ARNUITY ELLIPTA 100 MCG/ACT AEPB INHALE 1 PUFF INTO THE LUNGS DAILY 30 each 3  . atorvastatin (LIPITOR) 80 MG tablet TAKE 1 TABLET(80 MG) BY MOUTH DAILY 90 tablet 1  . CALCIUM PO Take 1,000 mg  by mouth daily.    . cholecalciferol (VITAMIN D3) 25 MCG (1000 UNIT) tablet Take 1,000 Units by mouth daily.    Marland Kitchen Desvenlafaxine Succinate (PRISTIQ PO) Take 100 mg by mouth daily.    . diclofenac (VOLTAREN) 75 MG EC tablet Take 1 tablet (75 mg total) by mouth 2 (two) times daily as needed for mild pain or moderate pain. Take with food 60 tablet 0  . Ferrous Sulfate (IRON) 325 (65 Fe) MG TABS Take 1 tablet by mouth daily.    Marland Kitchen  fluticasone (FLONASE) 50 MCG/ACT nasal spray INSTILL 1 TO 2 SPRAYS INTO EACH NOSTRIL ONCE A DAY 16 g 11  . gabapentin (NEURONTIN) 300 MG capsule Take 1 capsule (300 mg total) by mouth 2 (two) times daily. 180 capsule 1  . hydrochlorothiazide (MICROZIDE) 12.5 MG capsule TAKE 1 CAPSULE(12.5 MG) BY MOUTH DAILY 90 capsule 0  . L-Methylfolate-Algae (DEPLIN 15 PO) Take 1 capsule by mouth daily.    Marland Kitchen loratadine (CLARITIN) 10 MG tablet Take 10 mg by mouth daily.    Marland Kitchen losartan (COZAAR) 50 MG tablet TAKE 1 TABLET BY MOUTH DAILY 90 tablet 0  . Multiple Vitamins-Minerals (ONE-A-DAY MENS 50+ PO) Take 1 tablet by mouth daily.    Marland Kitchen omeprazole (PRILOSEC) 20 MG capsule Take 20 mg by mouth daily.    . ondansetron (ZOFRAN) 4 MG tablet Take 4 mg by mouth every 8 (eight) hours as needed for nausea or vomiting.    Marland Kitchen tiZANidine (ZANAFLEX) 4 MG tablet Take 4 mg by mouth as needed for muscle spasms.     Marland Kitchen VASCEPA 1 g capsule TAKE 2 CAPSULES(2 GRAMS) BY MOUTH TWICE DAILY 120 capsule 2  . vitamin B-12 (CYANOCOBALAMIN) 1000 MCG tablet Take 1,000 mcg by mouth daily.    . vitamin C (ASCORBIC ACID) 500 MG tablet Take 500 mg by mouth daily.     No current facility-administered medications for this encounter.    REVIEW OF SYSTEMS: A 10+ POINT REVIEW OF SYSTEMS WAS OBTAINED including neurology, dermatology, psychiatry, cardiac, respiratory, lymph, extremities, GI, GU, musculoskeletal, constitutional, reproductive, HEENT.  Patient papillated the lump along the superior areolar border.  She denies any pain  in this area nipple discharge or bleeding.  She denies any new bony pain headaches or blurred vision.  PHYSICAL EXAM:  Vitals - 1 value per visit 4/97/0263  SYSTOLIC 785  DIASTOLIC 79  Pulse 97  Temperature 98.2  Respirations 20  Weight (lb) 257.1  Height 5' 4"  BMI 44.13  VISIT REPORT     Lungs are clear to auscultation bilaterally. Heart has regular rate and rhythm. No palpable cervical, supraclavicular, or axillary adenopathy. Abdomen soft, non-tender, normal bowel sounds. Left breast with no palpable mass, nipple discharge, or bleeding.  Right breast with palpable pea-sized nodule along the upper portion of the areolar border at the 12 o'clock position.  No nipple discharge or bleeding.  No other palpable nodules within the breast..   KPS = 90  100 - Normal; no complaints; no evidence of disease. 90   - Able to carry on normal activity; minor signs or symptoms of disease. 80   - Normal activity with effort; some signs or symptoms of disease. 94   - Cares for self; unable to carry on normal activity or to do active work. 60   - Requires occasional assistance, but is able to care for most of his personal needs. 50   - Requires considerable assistance and frequent medical care. 58   - Disabled; requires special care and assistance. 41   - Severely disabled; hospital admission is indicated although death not imminent. 51   - Very sick; hospital admission necessary; active supportive treatment necessary. 10   - Moribund; fatal processes progressing rapidly. 0     - Dead  Karnofsky DA, Abelmann WH, Craver LS and Burchenal [facility] [phone]) The use of the nitrogen mustards in the palliative treatment of carcinoma: with particular reference to bronchogenic carcinoma Cancer 1 634-56  LABORATORY DATA:  Lab Results  Component Value Date  WBC 8.0 09/11/2020   HGB 12.4 09/11/2020   HCT 37.7 09/11/2020   MCV 90.8 09/11/2020   PLT 262 09/11/2020   Lab Results  Component Value Date   NA  136 09/11/2020   K 4.0 09/11/2020   CL 101 09/11/2020   CO2 26 09/11/2020   Lab Results  Component Value Date   ALT 58 (H) 09/11/2020   AST 49 (H) 09/11/2020   ALKPHOS 98 09/11/2020   BILITOT 0.4 09/11/2020    PULMONARY FUNCTION TEST:   Recent Review Flowsheet Data   There is no flowsheet data to display.     RADIOGRAPHY: US BREAST LTD UNI RIGHT INC AXILLA  Result Date: 08/29/2020 CLINICAL DATA:  Palpable mass in the RIGHT breast noted 1 month ago. Patient has a family history of breast cancer in mother at age 53 and paternal aunt in her 14's. EXAM: DIGITAL DIAGNOSTIC UNILATERAL RIGHT MAMMOGRAM WITH TOMOSYNTHESIS AND CAD; ULTRASOUND RIGHT BREAST LIMITED TECHNIQUE: Right digital diagnostic mammography and breast tomosynthesis was performed. The images were evaluated with computer-aided detection.; Targeted ultrasound examination of the right breast was performed COMPARISON:  05/31/2020 ACR Breast Density Category b: There are scattered areas of fibroglandular density. FINDINGS: In the UPPER-OUTER periareolar region of the RIGHT breast, there is a small irregular mass associated with microcalcifications and marked with a BB as palpable, corresponding to the area of patient's concern. Within the LOWER portion of the RIGHT breast, numerous skin lesions are confirmed with additional images and skin markers. On physical exam, I palpate a discrete mass in the 11-12 o'clock location of the RIGHT breast, at the areolar margin. There is no visible skin changes in this region. Targeted ultrasound is performed, showing an irregular hypoechoic mass containing calcifications and internal blood flow in the 12 o'clock retroareolar region of the RIGHT breast. Mass measures 0.6 x 0.6 x 0.6 centimeters. Evaluation of the RIGHT axilla is negative for adenopathy. IMPRESSION: Indeterminate mass in the 12 o'clock retroareolar region of the RIGHT breast for which biopsy is recommended. RECOMMENDATION: Ultrasound-guided  core biopsy of the RIGHT breast. I have discussed the findings and recommendations with the patient. If applicable, a reminder letter will be sent to the patient regarding the next appointment. BI-RADS CATEGORY  4: Suspicious. Electronically Signed   By: Nolon Nations M.D.   On: 08/29/2020 09:40   MM DIAG BREAST TOMO UNI RIGHT  Result Date: 08/29/2020 CLINICAL DATA:  Palpable mass in the RIGHT breast noted 1 month ago. Patient has a family history of breast cancer in mother at age 53 and paternal aunt in her 67's. EXAM: DIGITAL DIAGNOSTIC UNILATERAL RIGHT MAMMOGRAM WITH TOMOSYNTHESIS AND CAD; ULTRASOUND RIGHT BREAST LIMITED TECHNIQUE: Right digital diagnostic mammography and breast tomosynthesis was performed. The images were evaluated with computer-aided detection.; Targeted ultrasound examination of the right breast was performed COMPARISON:  05/31/2020 ACR Breast Density Category b: There are scattered areas of fibroglandular density. FINDINGS: In the UPPER-OUTER periareolar region of the RIGHT breast, there is a small irregular mass associated with microcalcifications and marked with a BB as palpable, corresponding to the area of patient's concern. Within the LOWER portion of the RIGHT breast, numerous skin lesions are confirmed with additional images and skin markers. On physical exam, I palpate a discrete mass in the 11-12 o'clock location of the RIGHT breast, at the areolar margin. There is no visible skin changes in this region. Targeted ultrasound is performed, showing an irregular hypoechoic mass containing calcifications and internal blood flow in the 12  o'clock retroareolar region of the RIGHT breast. Mass measures 0.6 x 0.6 x 0.6 centimeters. Evaluation of the RIGHT axilla is negative for adenopathy. IMPRESSION: Indeterminate mass in the 12 o'clock retroareolar region of the RIGHT breast for which biopsy is recommended. RECOMMENDATION: Ultrasound-guided core biopsy of the RIGHT breast. I have  discussed the findings and recommendations with the patient. If applicable, a reminder letter will be sent to the patient regarding the next appointment. BI-RADS CATEGORY  4: Suspicious. Electronically Signed   By: Nolon Nations M.D.   On: 08/29/2020 09:40   MM CLIP PLACEMENT RIGHT  Result Date: 09/02/2020 CLINICAL DATA:  Status post ultrasound-guided core biopsy of RIGHT breast mass. EXAM: 3D DIAGNOSTIC RIGHT MAMMOGRAM POST ULTRASOUND BIOPSY COMPARISON:  Previous exam(s). FINDINGS: 3D Mammographic images were obtained following ultrasound guided biopsy of mass in the 12 o'clock retroareolar region of the RIGHT breast and placement of a heart shaped clip. The biopsy marking clip is in expected position at the site of biopsy. IMPRESSION: Appropriate positioning of the heart shaped biopsy marking clip at the site of biopsy in the mass in the 12 o'clock retroareolar region of the RIGHT breast. Final Assessment: Post Procedure Mammograms for Marker Placement Electronically Signed   By: Nolon Nations M.D.   On: 09/02/2020 08:24   [place] RT BREAST BX W LOC DEV 1ST LESION IMG BX SPEC US GUIDE  Addendum Date: 09/03/2020   ADDENDUM REPORT: 09/03/2020 12:21 ADDENDUM: Pathology revealed GRADE II INVASIVE DUCTAL CARCINOMA, DUCTAL PAPILLOMA of the RIGHT breast, 12:00 o'clock. This was found to be concordant by Dr. Nolon Nations. Pathology results were discussed with the patient by telephone. The patient reported doing well after the biopsy with tenderness at the site. Post biopsy instructions and care were reviewed and questions were answered. The patient was encouraged to call [facility] for any additional concerns. My direct phone number was provided. The patient was referred to [facility] on September 11, 2020. Pathology results reported by Terie Purser, RN on 09/03/2020. Electronically Signed   By: Nolon Nations  M.D.   On: 09/03/2020 12:21   Result Date: 09/03/2020 CLINICAL DATA:  Patient presents for ultrasound-guided core biopsy of RIGHT breast mass. EXAM: ULTRASOUND GUIDED RIGHT BREAST CORE NEEDLE BIOPSY COMPARISON:  Previous exam(s). PROCEDURE: I met with the patient and we discussed the procedure of ultrasound-guided biopsy, including benefits and alternatives. We discussed the high likelihood of a successful procedure. We discussed the risks of the procedure, including infection, bleeding, tissue injury, clip migration, and inadequate sampling. Informed written consent was given. The usual time-out protocol was performed immediately prior to the procedure. Lesion quadrant: 12 o'clock retroareolar RIGHT breast Using sterile technique and 1% Lidocaine as local anesthetic, under direct ultrasound visualization, a 12 gauge coaxial spring-loaded device was used to perform biopsy of mass in the 12 o'clock retroareolar region of the RIGHT breast using a LATERAL to MEDIAL approach. At the conclusion of the procedure heart shaped tissue marker clip was deployed into the biopsy cavity. Follow up 2 view mammogram was performed and dictated separately. IMPRESSION: Ultrasound guided biopsy of RIGHT breast mass. No apparent complications. Electronically Signed: By: Nolon Nations M.D. On: 09/02/2020 08:23      IMPRESSION: Stage IA,  Right Breast UOQ, Invasive Ductal Carcinoma, ER+ / PR+ / Her2-, Grade 2  The patient will be a good candidate for breast conservation with radiotherapy to the right breast. We  discussed the general course of radiation, potential side effects, and toxicities with radiation and the patient is interested in this approach.   The patient's tumor is along the areolar border and it is possible that she may require removal of the nipple areolar complex area.  At this point patient still would wish to proceed with lumpectomy and adjuvant radiation therapy rather than mastectomy.   PLAN:  1. Right  lumpectomy and sentinel node procedure, Oncotype possible depending on final size of the tumor. 2. Adjuvant radiation therapy 3. Adjuvant hormonal therapy with an aromatase inhibitor.   ------------------------------------------------  [name], PhD, MD  This document serves as a record of services personally performed by Gery Pray, MD. It was created on his behalf by Clerance Lav, a trained medical scribe. The creation of this record is based on the scribe's personal observations and the provider's statements to them. This document has been checked and approved by the attending provider.

## 2020-09-12 ENCOUNTER — Telehealth: Payer: Self-pay | Admitting: Oncology

## 2020-09-12 NOTE — Telephone Encounter (Signed)
Scheduled appointment per 3/16 los. Pt aware.

## 2020-09-16 DIAGNOSIS — F419 Anxiety disorder, unspecified: Secondary | ICD-10-CM | POA: Diagnosis not present

## 2020-09-16 DIAGNOSIS — F3289 Other specified depressive episodes: Secondary | ICD-10-CM | POA: Diagnosis not present

## 2020-09-18 DIAGNOSIS — H35373 Puckering of macula, bilateral: Secondary | ICD-10-CM | POA: Diagnosis not present

## 2020-09-18 DIAGNOSIS — E119 Type 2 diabetes mellitus without complications: Secondary | ICD-10-CM | POA: Diagnosis not present

## 2020-09-19 ENCOUNTER — Telehealth: Payer: Self-pay | Admitting: *Deleted

## 2020-09-19 ENCOUNTER — Encounter: Payer: Self-pay | Admitting: Dietician

## 2020-09-19 ENCOUNTER — Encounter: Payer: Self-pay | Admitting: *Deleted

## 2020-09-19 LAB — HM DIABETES EYE EXAM

## 2020-09-19 NOTE — Telephone Encounter (Signed)
Spoke with patient to follow up from [facility] 3/16 and assess navigation needs. She is interested in the [employer] and I informed I would send her information so that get started. Patient denies any other concerns at this time. Encouraged her to call should anything arise.  Patient verbalized understanding.

## 2020-09-19 NOTE — Progress Notes (Signed)
Nutrition  Patient identified after attending Breast Clinic on 09/11/2020. Patient given nutrition packet with RD contact information by nurse navigator at that time.  Chart reviewed. Patient with newly diagnosed right breast cancer. Treatment plan awaiting definitive surgery. She is scheduled for right breast lumpectomy with Dr. Ninfa Linden on 4/07.   Patient is not currently at nutrition risk. Please consult RD if nutrition issues arise.  Lajuan Lines, RD, LDN Clinical Nutrition After Hours/Weekend Pager # in [place]

## 2020-09-23 DIAGNOSIS — F419 Anxiety disorder, unspecified: Secondary | ICD-10-CM | POA: Diagnosis not present

## 2020-09-23 DIAGNOSIS — F3289 Other specified depressive episodes: Secondary | ICD-10-CM | POA: Diagnosis not present

## 2020-09-24 ENCOUNTER — Encounter: Payer: Self-pay | Admitting: Family Medicine

## 2020-09-26 ENCOUNTER — Encounter (HOSPITAL_BASED_OUTPATIENT_CLINIC_OR_DEPARTMENT_OTHER): Payer: Self-pay | Admitting: Surgery

## 2020-09-26 ENCOUNTER — Other Ambulatory Visit: Payer: Self-pay

## 2020-09-27 ENCOUNTER — Encounter (HOSPITAL_BASED_OUTPATIENT_CLINIC_OR_DEPARTMENT_OTHER)
Admission: RE | Admit: 2020-09-27 | Discharge: 2020-09-27 | Disposition: A | Discharge status: Home / Self Care | Payer: BC Managed Care – PPO | Source: Ambulatory Visit | Attending: Surgery | Admitting: Surgery

## 2020-09-27 DIAGNOSIS — Z01810 Encounter for preprocedural cardiovascular examination: Secondary | ICD-10-CM | POA: Diagnosis not present

## 2020-09-27 MED ORDER — ENSURE PRE-SURGERY PO LIQD: 296.0000 mL | Freq: Once | ORAL | Status: DC

## 2020-09-27 NOTE — Progress Notes (Signed)

## 2020-09-29 ENCOUNTER — Other Ambulatory Visit: Payer: Self-pay | Admitting: Family Medicine

## 2020-09-29 DIAGNOSIS — I10 Essential (primary) hypertension: Secondary | ICD-10-CM

## 2020-09-30 ENCOUNTER — Other Ambulatory Visit (HOSPITAL_COMMUNITY)
Admission: RE | Admit: 2020-09-30 | Discharge: 2020-09-30 | Disposition: A | Discharge status: Home / Self Care | Payer: BC Managed Care – PPO | Source: Ambulatory Visit | Attending: Surgery | Admitting: Surgery

## 2020-09-30 DIAGNOSIS — Z20822 Contact with and (suspected) exposure to covid-19: Secondary | ICD-10-CM | POA: Insufficient documentation

## 2020-09-30 DIAGNOSIS — Z01812 Encounter for preprocedural laboratory examination: Secondary | ICD-10-CM | POA: Insufficient documentation

## 2020-09-30 DIAGNOSIS — C50811 Malignant neoplasm of overlapping sites of right female breast: Secondary | ICD-10-CM | POA: Diagnosis not present

## 2020-09-30 DIAGNOSIS — Z79899 Other long term (current) drug therapy: Secondary | ICD-10-CM | POA: Diagnosis not present

## 2020-09-30 LAB — SARS CORONAVIRUS 2 (TAT 6-24 HRS): SARS Coronavirus 2: NEGATIVE

## 2020-10-02 DIAGNOSIS — F419 Anxiety disorder, unspecified: Secondary | ICD-10-CM | POA: Diagnosis not present

## 2020-10-02 DIAGNOSIS — F3289 Other specified depressive episodes: Secondary | ICD-10-CM | POA: Diagnosis not present

## 2020-10-02 NOTE — H&P (Signed)
Courtney Heys Appointment: 09/11/2020 9:00 AM Location: [facility] Patient #: [id number] DOB: [birth year] Single / Language: Cleophus Gould / Race: White Female   History of Present Illness ([name] Ninfa Linden MD; 09/11/2020 11:03 AM) The patient is a 55 year old female who presents with breast cancer. Chief complaint: Right breast cancer  This is a 55 year old female who presents with a right breast mass. She presents herself concerning to the nipple areolar complex in the 12 o'clock position. She has had unremarkable mammograms in December. She underwent repeat imaging showing a 0.6 cm mass. Her ultrasound of the axilla is negative. 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 It was greater than 95% ER positive, 15% PR positive, HER-2 negative, Ki-67 30%. She has had no problems regarding her breast. She denies nipple discharge. She does have a history of breast cancer. 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 She has no cardiopulmonary issues other than sleep apnea and she wears her CPAP regularly    Past Surgical History Susan Slipper, RN; 09/11/2020 8:09 AM) Gallbladder Surgery - Laparoscopic  Knee Surgery  Bilateral.  Diagnostic Studies History Susan Slipper, RN; 09/11/2020 8:09 AM) Colonoscopy  1-5 years ago >10 years ago Mammogram  within last year 1-3 years ago Pap Smear  1-5 years ago  Medication History Susan Slipper, RN; 09/11/2020 8:08 AM) Medications Reconciled  Social History Susan Slipper, RN; 09/11/2020 8:09 AM) Alcohol use  Moderate alcohol use, Occasional alcohol use. Caffeine use  Carbonated beverages, Coffee, Tea. No drug use  Tobacco use  Never smoker.  Family History Susan Slipper, RN; 09/11/2020 8:09 AM) Arthritis  Father, Mother. Breast Cancer  Mother. Diabetes Mellitus  Father, Mother. Heart disease in female family member before age 68  Hypertension  Father, Mother, Sister. Ischemic Bowel Disease   Mother. Respiratory Condition  Father.  Pregnancy / Birth History Susan Slipper, RN; 09/11/2020 8:09 AM) Age at menarche  66 years. Age of menopause  11-55 Gravida  0 Irregular periods  Para  0  Other Problems Susan Slipper, RN; 09/11/2020 8:09 AM) Anxiety Disorder  Back Pain  Depression  Gastroesophageal Reflux Disease  Heart murmur  High blood pressure  Hypercholesterolemia  Lump In Breast  Sleep Apnea     Review of Systems Susan Slipper RN; 09/11/2020 8:09 AM) General Not Present- Appetite Loss, Chills, Fatigue, Fever, Night Sweats, Weight Gain and Weight Loss. Skin Present- Rash. Not Present- Change in Wart/Mole, Dryness, Hives, Jaundice, New Lesions, Non-Healing Wounds and Ulcer. HEENT Present- Ringing in the Ears and Seasonal Allergies. Not Present- Earache, Hearing Loss, Hoarseness, Nose Bleed, Oral Ulcers, Sinus Pain, Sore Throat, Visual Disturbances, Wears glasses/contact lenses and Yellow Eyes. Respiratory Not Present- Bloody sputum, Chronic Cough, Difficulty Breathing, Snoring and Wheezing. Breast Present- Breast Mass. Not Present- Breast Pain, Nipple Discharge and Skin Changes. Cardiovascular Not Present- Chest Pain, Difficulty Breathing Lying Down, Leg Cramps, Palpitations, Rapid Heart Rate, Shortness of Breath and Swelling of Extremities. Gastrointestinal Present- Indigestion. Not Present- Abdominal Pain, Bloating, Bloody Stool, Change in Bowel Habits, Chronic diarrhea, Constipation, Difficulty Swallowing, Excessive gas, Gets full quickly at meals, Hemorrhoids, Nausea, Rectal Pain and Vomiting. Female Genitourinary Not Present- Frequency, Nocturia, Painful Urination, Pelvic Pain and Urgency. Musculoskeletal Present- Back Pain and Joint Pain. Not Present- Joint Stiffness, Muscle Pain, Muscle Weakness and Swelling of Extremities. Neurological Not Present- Decreased Memory, Fainting, Headaches, Numbness, Seizures, Tingling, Tremor, Trouble walking and  Weakness. Psychiatric Present- Anxiety and Depression. Not Present- Bipolar, Change in Sleep Pattern, Fearful and Frequent crying.  Endocrine Not Present- Cold Intolerance, Excessive Hunger, Hair Changes, Heat Intolerance, Hot flashes and New Diabetes. Hematology Not Present- Blood Thinners, Easy Bruising, Excessive bleeding, Gland problems, HIV and Persistent Infections.   Physical Exam ([name] A. Ninfa Linden MD; 09/11/2020 11:04 AM) The physical exam findings are as follows: Note: She appears well on exam  There is a small less than 1 cm mass at 12 o'clock position of the right breast just underneath the edge of the nipple areolar complex. There are no other abnormalities. There is no inversion of the nipple.  There is no axillary adenopathy    Assessment & Plan (Shany Marinez A. Ninfa Linden MD; 09/11/2020 11:06 AM) INVASIVE DUCTAL CARCINOMA OF BREAST, RIGHT (C50.911) Impression: I have reviewed the notes in the electronic medical records. I reviewed her mammograms, ultrasound, and pathology results. We also discussed her in our multidisciplinary breast cancer conference this morning.  She has a small right breast invasive ductal carcinoma. I discussed the diagnosis with family in detail. We discussed surgical treatment of breast cancers. We discussed breast conservation as well as mastectomy and the long-term results of both. This is a very small mass so  she wishes to proceed with breast conservation. I then discussed proceeding with a right breast lumpectomy and sentinel lymph node biopsy. I discussed the procedure in detail. We discussed the risks which includes but not limited to bleeding, infection, the need for further surgery if margins or lymph nodes positive, cardiopulmonary issues, postoperative recovery, etc. She understands and wished to proceed with surgery which will be scheduled as soon as possible.

## 2020-10-03 ENCOUNTER — Ambulatory Visit (HOSPITAL_BASED_OUTPATIENT_CLINIC_OR_DEPARTMENT_OTHER): Payer: BC Managed Care – PPO | Admitting: Anesthesiology

## 2020-10-03 ENCOUNTER — Ambulatory Visit (HOSPITAL_COMMUNITY)
Admission: RE | Admit: 2020-10-03 | Discharge: 2020-10-03 | Disposition: A | Discharge status: Home / Self Care | Payer: BC Managed Care – PPO | Source: Ambulatory Visit | Attending: Surgery | Admitting: Surgery

## 2020-10-03 ENCOUNTER — Encounter (HOSPITAL_BASED_OUTPATIENT_CLINIC_OR_DEPARTMENT_OTHER)
Admission: RE | Disposition: A | Discharge status: Home / Self Care | Payer: Self-pay | Source: Home / Self Care | Attending: Surgery

## 2020-10-03 ENCOUNTER — Ambulatory Visit (HOSPITAL_BASED_OUTPATIENT_CLINIC_OR_DEPARTMENT_OTHER)
Admission: RE | Admit: 2020-10-03 | Discharge: 2020-10-03 | Disposition: A | Discharge status: Home / Self Care | Payer: BC Managed Care – PPO | Attending: Surgery | Admitting: Surgery

## 2020-10-03 ENCOUNTER — Encounter (HOSPITAL_BASED_OUTPATIENT_CLINIC_OR_DEPARTMENT_OTHER): Payer: Self-pay | Admitting: Surgery

## 2020-10-03 ENCOUNTER — Other Ambulatory Visit: Payer: Self-pay

## 2020-10-03 DIAGNOSIS — C50811 Malignant neoplasm of overlapping sites of right female breast: Secondary | ICD-10-CM | POA: Insufficient documentation

## 2020-10-03 DIAGNOSIS — C50911 Malignant neoplasm of unspecified site of right female breast: Secondary | ICD-10-CM | POA: Diagnosis not present

## 2020-10-03 DIAGNOSIS — Z170 Estrogen receptor positive status [ER+]: Secondary | ICD-10-CM | POA: Diagnosis not present

## 2020-10-03 DIAGNOSIS — C50411 Malignant neoplasm of upper-outer quadrant of right female breast: Secondary | ICD-10-CM | POA: Diagnosis not present

## 2020-10-03 DIAGNOSIS — Z79899 Other long term (current) drug therapy: Secondary | ICD-10-CM | POA: Diagnosis not present

## 2020-10-03 DIAGNOSIS — G4733 Obstructive sleep apnea (adult) (pediatric): Secondary | ICD-10-CM | POA: Diagnosis not present

## 2020-10-03 DIAGNOSIS — Z20822 Contact with and (suspected) exposure to covid-19: Secondary | ICD-10-CM | POA: Insufficient documentation

## 2020-10-03 DIAGNOSIS — G8918 Other acute postprocedural pain: Secondary | ICD-10-CM | POA: Diagnosis not present

## 2020-10-03 HISTORY — PX: BREAST LUMPECTOMY WITH SENTINEL LYMPH NODE BIOPSY: SHX5597

## 2020-10-03 SURGERY — BREAST LUMPECTOMY WITH SENTINEL LYMPH NODE BX
Anesthesia: General | Site: Breast | Laterality: Right

## 2020-10-03 MED ORDER — CEFAZOLIN SODIUM-DEXTROSE 2-4 GM/100ML-% IV SOLN
2.0000 g | INTRAVENOUS | Status: AC
  Administered 2020-10-03: 2 g via INTRAVENOUS

## 2020-10-03 MED ORDER — CHLORHEXIDINE GLUCONATE CLOTH 2 % EX PADS: 6.0000 | MEDICATED_PAD | Freq: Once | CUTANEOUS | Status: DC

## 2020-10-03 MED ORDER — HYDROMORPHONE HCL 1 MG/ML IJ SOLN
INTRAMUSCULAR | Status: AC
  Filled 2020-10-03: qty 0.5

## 2020-10-03 MED ORDER — FENTANYL CITRATE (PF) 100 MCG/2ML IJ SOLN
INTRAMUSCULAR | Status: AC
  Filled 2020-10-03: qty 2

## 2020-10-03 MED ORDER — FENTANYL CITRATE (PF) 100 MCG/2ML IJ SOLN
100.0000 ug | Freq: Once | INTRAMUSCULAR | Status: AC
  Administered 2020-10-03: 100 ug via INTRAVENOUS

## 2020-10-03 MED ORDER — PROPOFOL 10 MG/ML IV BOLUS
INTRAVENOUS | Status: DC | PRN
  Administered 2020-10-03: 200 mg via INTRAVENOUS

## 2020-10-03 MED ORDER — ROPIVACAINE HCL 5 MG/ML IJ SOLN
INTRAMUSCULAR | Status: DC | PRN
  Administered 2020-10-03: 30 mL via PERINEURAL

## 2020-10-03 MED ORDER — MIDAZOLAM HCL 2 MG/2ML IJ SOLN
2.0000 mg | Freq: Once | INTRAMUSCULAR | Status: AC
  Administered 2020-10-03: 2 mg via INTRAVENOUS

## 2020-10-03 MED ORDER — CEFAZOLIN SODIUM-DEXTROSE 2-4 GM/100ML-% IV SOLN
INTRAVENOUS | Status: AC
  Filled 2020-10-03: qty 100

## 2020-10-03 MED ORDER — OXYCODONE HCL 5 MG PO TABS
ORAL_TABLET | ORAL | Status: AC
  Filled 2020-10-03: qty 1

## 2020-10-03 MED ORDER — OXYCODONE HCL 5 MG PO TABS: 5.0000 mg | ORAL_TABLET | Freq: Four times a day (QID) | ORAL | 0 refills | Status: DC | PRN

## 2020-10-03 MED ORDER — ACETAMINOPHEN 500 MG PO TABS
ORAL_TABLET | ORAL | Status: AC
  Filled 2020-10-03: qty 2

## 2020-10-03 MED ORDER — OXYCODONE HCL 5 MG/5ML PO SOLN: 5.0000 mg | Freq: Once | ORAL | Status: AC | PRN

## 2020-10-03 MED ORDER — AMISULPRIDE (ANTIEMETIC) 5 MG/2ML IV SOLN
10.0000 mg | Freq: Once | INTRAVENOUS | Status: DC | PRN
Start: 2020-10-03 — End: 2020-10-03

## 2020-10-03 MED ORDER — LIDOCAINE HCL (CARDIAC) PF 100 MG/5ML IV SOSY
PREFILLED_SYRINGE | INTRAVENOUS | Status: DC | PRN
  Administered 2020-10-03: 60 mg via INTRATRACHEAL

## 2020-10-03 MED ORDER — PROPOFOL 10 MG/ML IV BOLUS
INTRAVENOUS | Status: AC
  Filled 2020-10-03: qty 20

## 2020-10-03 MED ORDER — FENTANYL CITRATE (PF) 100 MCG/2ML IJ SOLN
INTRAMUSCULAR | Status: DC | PRN
  Administered 2020-10-03: 50 ug via INTRAVENOUS

## 2020-10-03 MED ORDER — OXYCODONE HCL 5 MG PO TABS
5.0000 mg | ORAL_TABLET | Freq: Once | ORAL | Status: AC | PRN
  Administered 2020-10-03: 5 mg via ORAL

## 2020-10-03 MED ORDER — MEPERIDINE HCL 25 MG/ML IJ SOLN: 6.2500 mg | INTRAMUSCULAR | Status: DC | PRN

## 2020-10-03 MED ORDER — DEXAMETHASONE SODIUM PHOSPHATE 10 MG/ML IJ SOLN
INTRAMUSCULAR | Status: DC | PRN
  Administered 2020-10-03: 5 mg via INTRAVENOUS

## 2020-10-03 MED ORDER — LACTATED RINGERS IV SOLN: INTRAVENOUS | Status: DC

## 2020-10-03 MED ORDER — ONDANSETRON HCL 4 MG/2ML IJ SOLN
INTRAMUSCULAR | Status: DC | PRN
  Administered 2020-10-03: 4 mg via INTRAVENOUS

## 2020-10-03 MED ORDER — PROMETHAZINE HCL 25 MG/ML IJ SOLN: 6.2500 mg | INTRAMUSCULAR | Status: DC | PRN

## 2020-10-03 MED ORDER — MIDAZOLAM HCL 2 MG/2ML IJ SOLN
INTRAMUSCULAR | Status: AC
  Filled 2020-10-03: qty 2

## 2020-10-03 MED ORDER — ONDANSETRON HCL 4 MG/2ML IJ SOLN
INTRAMUSCULAR | Status: AC
  Filled 2020-10-03: qty 2

## 2020-10-03 MED ORDER — TECHNETIUM TC 99M TILMANOCEPT KIT
1.0000 | PACK | Freq: Once | INTRAVENOUS | Status: AC | PRN
  Administered 2020-10-03: 1 via INTRADERMAL

## 2020-10-03 MED ORDER — HYDROMORPHONE HCL 1 MG/ML IJ SOLN
0.2500 mg | INTRAMUSCULAR | Status: DC | PRN
  Administered 2020-10-03: 0.5 mg via INTRAVENOUS

## 2020-10-03 MED ORDER — BUPIVACAINE-EPINEPHRINE 0.5% -1:200000 IJ SOLN
INTRAMUSCULAR | Status: DC | PRN
  Administered 2020-10-03: 16 mL

## 2020-10-03 MED ORDER — ACETAMINOPHEN 500 MG PO TABS
1000.0000 mg | ORAL_TABLET | ORAL | Status: AC
  Administered 2020-10-03: 1000 mg via ORAL

## 2020-10-03 SURGICAL SUPPLY — 53 items
ADH SKN CLS APL DERMABOND .7 (GAUZE/BANDAGES/DRESSINGS) ×1
APL PRP STRL LF DISP 70% ISPRP (MISCELLANEOUS) ×1
APPLIER CLIP 9.375 MED OPEN (MISCELLANEOUS)
APR CLP MED 9.3 20 MLT OPN (MISCELLANEOUS)
BINDER BREAST 3XL (GAUZE/BANDAGES/DRESSINGS) ×2 IMPLANT
BLADE CLIPPER SURG (BLADE) IMPLANT
BLADE SURG 15 STRL LF DISP TIS (BLADE) ×2 IMPLANT
BLADE SURG 15 STRL SS (BLADE) ×4
CANISTER SUCT 1200ML W/VALVE (MISCELLANEOUS) IMPLANT
CHLORAPREP W/TINT 26 (MISCELLANEOUS) ×2 IMPLANT
CLIP APPLIE 9.375 MED OPEN (MISCELLANEOUS) IMPLANT
COVER BACK TABLE 60X90IN (DRAPES) ×4 IMPLANT
COVER MAYO STAND STRL (DRAPES) ×2 IMPLANT
COVER PROBE W GEL 5X96 (DRAPES) ×2 IMPLANT
COVER WAND RF STERILE (DRAPES) IMPLANT
DECANTER SPIKE VIAL GLASS SM (MISCELLANEOUS) IMPLANT
DERMABOND ADVANCED (GAUZE/BANDAGES/DRESSINGS) ×1
DERMABOND ADVANCED .7 DNX12 (GAUZE/BANDAGES/DRESSINGS) ×1 IMPLANT
DRAIN CHANNEL 19F RND (DRAIN) IMPLANT
DRAIN HEMOVAC 1/8 X 5 (WOUND CARE) IMPLANT
DRAPE LAPAROSCOPIC ABDOMINAL (DRAPES) ×2 IMPLANT
DRAPE UTILITY XL STRL (DRAPES) ×2 IMPLANT
ELECT REM PT RETURN 9FT ADLT (ELECTROSURGICAL) ×2
ELECTRODE REM PT RTRN 9FT ADLT (ELECTROSURGICAL) ×1 IMPLANT
EVACUATOR SILICONE 100CC (DRAIN) IMPLANT
GAUZE SPONGE 4X4 12PLY STRL LF (GAUZE/BANDAGES/DRESSINGS) IMPLANT
GLOVE SURG SIGNA 7.5 PF LTX (GLOVE) ×2 IMPLANT
GOWN STRL REUS W/ TWL LRG LVL3 (GOWN DISPOSABLE) ×1 IMPLANT
GOWN STRL REUS W/ TWL XL LVL3 (GOWN DISPOSABLE) ×1 IMPLANT
GOWN STRL REUS W/TWL LRG LVL3 (GOWN DISPOSABLE) ×2
GOWN STRL REUS W/TWL XL LVL3 (GOWN DISPOSABLE) ×2
HEMOSTAT SURGICEL 2X14 (HEMOSTASIS) IMPLANT
KIT MARKER MARGIN INK (KITS) ×2 IMPLANT
NDL SAFETY ECLIPSE 18X1.5 (NEEDLE) ×1 IMPLANT
NEEDLE HYPO 18GX1.5 SHARP (NEEDLE) ×2
NEEDLE HYPO 25X1 1.5 SAFETY (NEEDLE) ×4 IMPLANT
NS IRRIG 1000ML POUR BTL (IV SOLUTION) IMPLANT
PACK BASIN DAY SURGERY FS (CUSTOM PROCEDURE TRAY) ×2 IMPLANT
PENCIL SMOKE EVACUATOR (MISCELLANEOUS) ×2 IMPLANT
PIN SAFETY STERILE (MISCELLANEOUS) IMPLANT
SLEEVE SCD COMPRESS KNEE MED (STOCKING) ×2 IMPLANT
SPONGE LAP 18X18 RF (DISPOSABLE) IMPLANT
SPONGE LAP 4X18 RFD (DISPOSABLE) ×2 IMPLANT
SUT ETHILON 3 0 PS 1 (SUTURE) IMPLANT
SUT MNCRL AB 4-0 PS2 18 (SUTURE) ×2 IMPLANT
SUT VIC AB 3-0 SH 27 (SUTURE) ×2
SUT VIC AB 3-0 SH 27X BRD (SUTURE) ×1 IMPLANT
SYR BULB EAR ULCER 3OZ GRN STR (SYRINGE) IMPLANT
SYR CONTROL 10ML LL (SYRINGE) ×4 IMPLANT
TOWEL GREEN STERILE FF (TOWEL DISPOSABLE) ×2 IMPLANT
TRAY FAXITRON CT DISP (TRAY / TRAY PROCEDURE) IMPLANT
TUBE CONNECTING 20X1/4 (TUBING) ×2 IMPLANT
YANKAUER SUCT BULB TIP NO VENT (SUCTIONS) IMPLANT

## 2020-10-03 NOTE — Progress Notes (Signed)
Assisted Dr. Sabra Heck with right, ultrasound guided, pectoralis block. Side rails up, monitors on throughout procedure. See vital signs in flow sheet. Tolerated Procedure well.

## 2020-10-03 NOTE — Transfer of Care (Signed)
Immediate Anesthesia Transfer of Care Note  Patient: Susan Gould  Procedure(s) Performed: RIGHT BREAST LUMPECTOMY WITH SENTINEL LYMPH NODE BIOPSY (Right Breast)  Patient Location: PACU  Anesthesia Type:General  Level of Consciousness: drowsy, patient cooperative and responds to stimulation  Airway & Oxygen Therapy: Patient Spontanous Breathing and Patient connected to face mask oxygen  Post-op Assessment: Report given to RN and Post -op Vital signs reviewed and stable  Post vital signs: Reviewed and stable  Last Vitals:  Vitals Value Taken Time  BP    Temp    Pulse    Resp 16 10/03/20 1200  SpO2    Vitals shown include unvalidated device data.  Last Pain:  Vitals:   10/03/20 0935  TempSrc: Oral  PainSc: 0-No pain         Complications: No complications documented.

## 2020-10-03 NOTE — Discharge Instructions (Signed)
Pt took tylenol po at 0930. No tylenol products till after 3:30 pm today     [employer] Phone Number [phone]  BREAST BIOPSY/ PARTIAL MASTECTOMY: POST OP INSTRUCTIONS  Always review your discharge instruction sheet given to you by the facility where your surgery was performed.  IF YOU HAVE DISABILITY OR FAMILY LEAVE FORMS, YOU MUST BRING THEM TO THE OFFICE FOR PROCESSING.  DO NOT GIVE THEM TO YOUR DOCTOR.  1. A prescription for pain medication may be given to you upon discharge.  Take your pain medication as prescribed, if needed.  If narcotic pain medicine is not needed, then you may take acetaminophen (Tylenol) or ibuprofen (Advil) as needed. 2. Take your usually prescribed medications unless otherwise directed 3. If you need a refill on your pain medication, please contact your pharmacy.  They will contact our office to request authorization.  Prescriptions will not be filled after 5pm or on week-ends. 4. You should eat very light the first 24 hours after surgery, such as soup, crackers, pudding, etc.  Resume your normal diet the day after surgery. 5. Most patients will experience some swelling and bruising in the breast.  Ice packs and a good support bra will help.  Swelling and bruising can take several days to resolve.  6. It is common to experience some constipation if taking pain medication after surgery.  Increasing fluid intake and taking a stool softener will usually help or prevent this problem from occurring.  A mild laxative (Milk of Magnesia or Miralax) should be taken according to package directions if there are no bowel movements after 48 hours. 7. Unless discharge instructions indicate otherwise, you may remove your bandages 24-48 hours after surgery, and you may shower at that time.  You may have steri-strips (small skin tapes) in place directly over the incision.  These strips should be left on the skin for 7-10 days.  If your surgeon used skin glue  on the incision, you may shower in 24 hours.  The glue will flake off over the next 2-3 weeks.  Any sutures or staples will be removed at the office during your follow-up visit. 8. ACTIVITIES:  You may resume regular daily activities (gradually increasing) beginning the next day.  Wearing a good support bra or sports bra minimizes pain and swelling.  You may have sexual intercourse when it is comfortable. a. You may drive when you no longer are taking prescription pain medication, you can comfortably wear a seatbelt, and you can safely maneuver your car and apply brakes. b. RETURN TO WORK:  ______________________________________________________________________________________ 9. You should see your doctor in the office for a follow-up appointment approximately two weeks after your surgery.  Your doctor's nurse will typically make your follow-up appointment when she calls you with your pathology report.  Expect your pathology report 2-3 business days after your surgery.  You may call to check if you do not hear from [place] after three days. 10. OTHER INSTRUCTIONS:OK TO SHOWER STARTING TOMORROW 11. ICE PACK, TYLENOL, AND IBUPROFEN ALSO FOR PAIN 12. NO VIGOROUS ACTIVITY FOR ONE WEEK 13.  _______________________________________________________________________________________________ _____________________________________________________________________________________________________________________________________ _____________________________________________________________________________________________________________________________________ _____________________________________________________________________________________________________________________________________  WHEN TO CALL YOUR DOCTOR: 1. Fever over 101.0 2. Nausea and/or vomiting. 3. Extreme swelling or bruising. 4. Continued bleeding from incision. 5. Increased pain, redness, or drainage from the incision.  The clinic staff is  available to answer your questions during regular business hours.  Please don't hesitate to call and ask to speak to one of the nurses for  clinical concerns.  If you have a medical emergency, go to the nearest emergency room or call 911.  A surgeon from [facility] is always on call at the hospital.  For further questions, please visit [email or web address]    Post Anesthesia Home Care Instructions  Activity: Get plenty of rest for the remainder of the day. A responsible individual must stay with you for 24 hours following the procedure.  For the next 24 hours, DO NOT: -Drive a car -Paediatric nurse -Drink alcoholic beverages -Take any medication unless instructed by your physician -Make any legal decisions or sign important papers.  Meals: Start with liquid foods such as gelatin or soup. Progress to regular foods as tolerated. Avoid greasy, spicy, heavy foods. If nausea and/or vomiting occur, drink only clear liquids until the nausea and/or vomiting subsides. Call your physician if vomiting continues.  Special Instructions/Symptoms: Your throat may feel dry or sore from the anesthesia or the breathing tube placed in your throat during surgery. If this causes discomfort, gargle with warm salt water. The discomfort should disappear within 24 hours.  If you had a scopolamine patch placed behind your ear for the management of post- operative nausea and/or vomiting:  1. The medication in the patch is effective for 72 hours, after which it should be removed.  Wrap patch in a tissue and discard in the trash. Wash hands thoroughly with soap and water. 2. You may remove the patch earlier than 72 hours if you experience unpleasant side effects which may include dry mouth, dizziness or visual disturbances. 3. Avoid touching the patch. Wash your hands with soap and water after contact with the patch.    Call your surgeon if you experience:   1.  Fever over 101.0. 2.  Inability  to urinate. 3.  Nausea and/or vomiting. 4.  Extreme swelling or bruising at the surgical site. 5.  Continued bleeding from the incision. 6.  Increased pain, redness or drainage from the incision. 7.  Problems related to your pain medication. 8.  Any problems and/or concerns

## 2020-10-03 NOTE — Anesthesia Preprocedure Evaluation (Addendum)
Anesthesia Evaluation  Patient identified by MRN, date of birth, ID band Patient awake    Reviewed: Allergy & Precautions, NPO status , Patient's Chart, lab work & pertinent test results  Airway Mallampati: II  TM Distance: >3 FB Neck ROM: Full    Dental no notable dental hx.    Pulmonary asthma , sleep apnea ,    Pulmonary exam normal breath sounds clear to auscultation       Cardiovascular hypertension, Pt. on medications negative cardio ROS Normal cardiovascular exam Rhythm:Regular Rate:Normal     Neuro/Psych Anxiety Depression negative neurological ROS  negative psych ROS   GI/Hepatic Neg liver ROS, GERD  ,  Endo/Other  Morbid obesity  Renal/GU negative Renal ROS  negative genitourinary   Musculoskeletal  (+) Arthritis , Osteoarthritis,    Abdominal (+) + obese,   Peds negative pediatric ROS (+)  Hematology negative hematology ROS (+)   Anesthesia Other Findings   Reproductive/Obstetrics negative OB ROS                            Anesthesia Physical Anesthesia Plan  ASA: III  Anesthesia Plan: General   Post-op Pain Management:  Regional for Post-op pain   Induction: Intravenous  PONV Risk Score and Plan: 3 and Ondansetron, Dexamethasone and Midazolam  Airway Management Planned: LMA  Additional Equipment:   Intra-op Plan:   Post-operative Plan: Extubation in OR  Informed Consent: I have reviewed the patients History and Physical, chart, labs and discussed the procedure including the risks, benefits and alternatives for the proposed anesthesia with the patient or authorized representative who has indicated his/her understanding and acceptance.     Dental advisory given  Plan Discussed with: CRNA  Anesthesia Plan Comments:         Anesthesia Quick Evaluation

## 2020-10-03 NOTE — Interval H&P Note (Signed)
History and Physical Interval Note:j no change in H and P  10/03/2020 9:36 AM  Susan Gould  has presented today for surgery, with the diagnosis of RIGHT BREAST CANCER.  The various methods of treatment have been discussed with the patient and family. After consideration of risks, benefits and other options for treatment, the patient has consented to  Procedure(s): RIGHT BREAST LUMPECTOMY WITH SENTINEL LYMPH NODE BX (Right) as a surgical intervention.  The patient's history has been reviewed, patient examined, no change in status, stable for surgery.  I have reviewed the patient's chart and labs.  Questions were answered to the patient's satisfaction.     [name]

## 2020-10-03 NOTE — Anesthesia Postprocedure Evaluation (Signed)
Anesthesia Post Note  Patient: Susan Gould  Procedure(s) Performed: RIGHT BREAST LUMPECTOMY WITH SENTINEL LYMPH NODE BIOPSY (Right Breast)     Patient location during evaluation: PACU Anesthesia Type: General Level of consciousness: awake and alert Pain management: pain level controlled Vital Signs Assessment: post-procedure vital signs reviewed and stable Respiratory status: spontaneous breathing, nonlabored ventilation and respiratory function stable Cardiovascular status: blood pressure returned to baseline and stable Postop Assessment: no apparent nausea or vomiting Anesthetic complications: no   No complications documented.  Last Vitals:  Vitals:   10/03/20 1245 10/03/20 1300  BP: 117/62 126/66  Pulse: 71 86  Resp: 11 15  Temp:    SpO2: 100% 95%    Last Pain:  Vitals:   10/03/20 1300  TempSrc:   PainSc: 2                  Lynda Rainwater

## 2020-10-03 NOTE — Progress Notes (Signed)
Emotional support provided through nuclear medicine breast injections. Vital signs stable. Patient tolerated well.

## 2020-10-03 NOTE — Op Note (Signed)
   TENEISHA GIGNAC 10/03/2020   Pre-op Diagnosis: RIGHT BREAST CANCER     Post-op Diagnosis: same  Procedure(s): RIGHT BREAST LUMPECTOMY WITH DEEP RIGHT AXILLARY SENTINEL LYMPH NODE BIOPSY  Surgeon(s): [name], MD  Anesthesia: General  Staff:  Circulator: Maurene Capes, RN Scrub Person: Charisse March, RN  Estimated Blood Loss: Minimal               Specimens: SENT TO PATH  Indications: This is a 55 year old female who presented with a small palpable mass at the 12 o'clock position of the right breast.  She underwent an ultrasound showing the mass.  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  The decision was made to proceed with a lumpectomy and axillary sentinel lymph node biopsy on the right side  Procedure: The patient was brought to operating identifies correct patient.  She is placed upon the operating table general anesthesia was induced.  She had already had radioactive isotope injected around the areola of the right breast.  Her right breast and chest were prepped and draped including the axilla in the normal sterile fashion.  Again the mass was palpable underneath the areola in the upper edge.  I anesthetized skin with Marcaine and then made an elliptical incision including the overlying skin and areola with a scalpel.  I then took this down to the breast tissue with electrocautery.  I was able to dissect deep to the mass and completely excised the likely specimen in its entirety staying widely around it.  I then marked all margins with paint and then sent the specimen to pathology for evaluation.  Hemostasis was achieved with the cautery.  I next identified an area of increased uptake of radioactive isotope in the right axilla with the neoprobe.  I anesthetized the overlying skin with Marcaine and made a small incision with a scalpel.  I then with the aid of the cautery dissected down to the deep right axillary tissue.  I  identified 2 lymph nodes with the neoprobe which I excised with the surrounding fat with electrocautery.  Once these nodes were removed there is no other increased uptake of the radioactive isotope.  Hemostasis appeared to be achieved.  I next paced a single surgical clip at the lumpectomy site of the right breast.  I then closed both incisions with interrupted 3-0 Vicryl sutures and running 4-0 Monocryl sutures.  Dermabond was then applied.  The patient tolerated the procedure well.  All the counts were correct at the end of the procedure.  The patient was then extubated in the operating room and taken in a stable condition to the recovery room.          [name]   Date: 10/03/2020  Time: 11:51 AM

## 2020-10-03 NOTE — Anesthesia Procedure Notes (Signed)
Procedure Name: LMA Insertion Date/Time: 10/03/2020 11:11 AM Performed by: Glory Buff, CRNA Pre-anesthesia Checklist: Patient identified, Emergency Drugs available, Suction available and Patient being monitored Patient Re-evaluated:Patient Re-evaluated prior to induction Oxygen Delivery Method: Circle system utilized Preoxygenation: Pre-oxygenation with 100% oxygen Induction Type: IV induction Ventilation: Mask ventilation without difficulty LMA: LMA inserted LMA Size: 4.0 Number of attempts: 1 Placement Confirmation: positive ETCO2 Tube secured with: Tape Dental Injury: Teeth and Oropharynx as per pre-operative assessment

## 2020-10-03 NOTE — Anesthesia Procedure Notes (Signed)
Anesthesia Regional Block: Pectoralis block   Pre-Anesthetic Checklist: ,, timeout performed, Correct Patient, Correct Site, Correct Laterality, Correct Procedure, Correct Position, site marked, Risks and benefits discussed,  Surgical consent,  Pre-op evaluation,  At surgeon's request and post-op pain management  Laterality: Right  Prep: chloraprep       Needles:  Injection technique: Single-shot  Needle Type: Stimiplex     Needle Length: 9cm  Needle Gauge: 21     Additional Needles:   Narrative:  Start time: 10/03/2020 10:19 AM End time: 10/03/2020 10:24 AM Injection made incrementally with aspirations every 5 mL.  Performed by: Personally  Anesthesiologist: Lynda Rainwater, MD

## 2020-10-04 ENCOUNTER — Encounter (HOSPITAL_BASED_OUTPATIENT_CLINIC_OR_DEPARTMENT_OTHER): Payer: Self-pay | Admitting: Surgery

## 2020-10-04 LAB — SURGICAL PATHOLOGY

## 2020-10-07 DIAGNOSIS — F419 Anxiety disorder, unspecified: Secondary | ICD-10-CM | POA: Diagnosis not present

## 2020-10-07 DIAGNOSIS — F3289 Other specified depressive episodes: Secondary | ICD-10-CM | POA: Diagnosis not present

## 2020-10-08 ENCOUNTER — Encounter: Payer: Self-pay | Admitting: *Deleted

## 2020-10-08 ENCOUNTER — Telehealth: Payer: Self-pay | Admitting: *Deleted

## 2020-10-08 NOTE — Telephone Encounter (Signed)
Received order for oncotype testing. Requisition faxed to pathology and [facility] °

## 2020-10-14 DIAGNOSIS — F3289 Other specified depressive episodes: Secondary | ICD-10-CM | POA: Diagnosis not present

## 2020-10-14 DIAGNOSIS — F419 Anxiety disorder, unspecified: Secondary | ICD-10-CM | POA: Diagnosis not present

## 2020-10-14 DIAGNOSIS — M25561 Pain in right knee: Secondary | ICD-10-CM | POA: Diagnosis not present

## 2020-10-18 DIAGNOSIS — Z170 Estrogen receptor positive status [ER+]: Secondary | ICD-10-CM | POA: Diagnosis not present

## 2020-10-18 DIAGNOSIS — C50411 Malignant neoplasm of upper-outer quadrant of right female breast: Secondary | ICD-10-CM | POA: Diagnosis not present

## 2020-10-21 DIAGNOSIS — F3289 Other specified depressive episodes: Secondary | ICD-10-CM | POA: Diagnosis not present

## 2020-10-21 DIAGNOSIS — F419 Anxiety disorder, unspecified: Secondary | ICD-10-CM | POA: Diagnosis not present

## 2020-10-22 ENCOUNTER — Telehealth: Payer: Self-pay | Admitting: *Deleted

## 2020-10-22 ENCOUNTER — Encounter: Payer: Self-pay | Admitting: *Deleted

## 2020-10-22 NOTE — Telephone Encounter (Signed)
Received oncotype results of 25/12%. Patient is aware. Referral has been placed for DR. Kinard appt. Team notified.

## 2020-10-22 NOTE — Progress Notes (Signed)
Chief Complaint  Patient presents with  . Hypertension    Nonfasting med check. Has a rash on the back of her head that has been there a while x 6 months. Tried cort-aid, GoldBond cream-not helping. Had an EKG pre-op and there were some changes and would like to know if you want her to follow up with cardiology.    Patient presents for 4 month follow-up.  She is complaining of itching/scaling/rash on the back of her head.  Gold Bond cream, cortaid Selsun Blue wasn't helpful. Rash is always present, she scratches at it a lot. First noted in winter 2 years ago, resolved in warmer weather.  Recurred this past winter, hasn't resolved yet. No new products.  Since her last visit here, she has been diagnosed with R breast cancer. She underwent R lumpectomy with sentinel LN biopsy by Dr. Ninfa Linden on 10/03/20. Based on oncocyte results, she is being referred for radiation. Planning for Anastrozole after radiation. She reports feeling well, no drainage or pain. Has a cancer buddy, which has been helpful (through [employer])  FINAL MICROSCOPIC DIAGNOSIS:  A. BREAST, RIGHT, LUMPECTOMY:  - Invasive ductal carcinoma, Nottingham grade 2 of 3, 1.1 cm  - Ductal carcinoma in-situ, intermediate grade  - Calcifications associated with carcinoma  - Margins uninvolved by carcinoma (<0.1 cm; anterior margin)  - Lymphovascular space invasion present  - Previous biopsy site changes present  - See oncology table below   B. LYMPH NODE, RIGHT AXILLARY, SENTINEL, EXCISION:  - No carcinoma identified in one lymph node (0/1)   Hypertension. She is currently taking 50m losartan, 12.571mHCTZ and amlodipine 7.67m36mShe is tolerating this without side effects.  BP's have been fine at other doctor's visits, not checked at home.  BP Readings from Last 3 Encounters:  10/03/20 133/63  09/11/20 (!) 151/79  06/20/20 118/62   She denies headaches, dizziness, chest pain, shortness of breath, muscle cramps,  edema.  Mixed hyperlipidemia--she is taking Vascepa 2 BID and atorvastatin 73m81mDenies side effects.  Some stress-eating reported around October, and TG was higher on last check-- +chips/pizza, not much sweets. Eating more greens. She stopped Vascepa on 4/1 (a week prior to her surgery), restarted post-op. Diet has been better.  Lab Results  Component Value Date   CHOL 182 06/20/2020   HDL 44 06/20/2020   LDLCALC 92 06/20/2020   TRIG 278 (H) 06/20/2020   CHOLHDL 4.1 06/20/2020    PCOS, Insulin-resistance, pre-diabetes:  Took metformin for a long time, stopped due to ongoing issues with diarrhea.   A1c increased from 5.8 to 6.1 upon stopping metformin.  She noted some mild constipation since stopping the metformin, and we had discussed increased water intake, metamucil, high fiber diet.  Constipation has been about the same. Hasn't taken metamucil or miralax. Lab Results  Component Value Date   HGBA1C 6.1 (A) 06/20/2020   Chronic back pain: She had ablation on the R at 3 levels.  L side then was acting up more. Has been having some recurrent R sided pain recently. She is trying to exercise, walking is limited by pain, but is trying. Tizanidine at night helps (pain sometimes interferes with her sleep). Pain is tolerable. Spreads to buttocks, not into legs.  She reports feeling overwhelmed.  Has had a lot on her plate.  Got new HVAC yesterday. Starts PT tomorrow (related to breast) Getting regular therapy/counseling. Overall is hanging in there.  EKG showed nonspecific changes. D/w patient--no need for further evaluation (done for  pre-op clearance, was cleared).   PMH, PSH, SH reviewed  Outpatient Encounter Medications as of 10/23/2020  Medication Sig  . ALPRAZolam (XANAX) 0.5 MG tablet TAKE 1/2 -1 TABLET BY MOUTH 3 TIMES DAILY AS NEEDED FOR ANXIETY  . amLODipine (NORVASC) 5 MG tablet TAKE 1 AND 1/2 TABLETS(7.5 MG) BY MOUTH DAILY  . ARNUITY ELLIPTA 100 MCG/ACT AEPB INHALE 1  PUFF INTO THE LUNGS DAILY  . cholecalciferol (VITAMIN D3) 25 MCG (1000 UNIT) tablet Take 1,000 Units by mouth daily.  Marland Kitchen Desvenlafaxine Succinate (PRISTIQ PO) Take 100 mg by mouth daily.  . diclofenac (VOLTAREN) 75 MG EC tablet Take 1 tablet (75 mg total) by mouth 2 (two) times daily as needed for mild pain or moderate pain. Take with food  . Ferrous Sulfate (IRON) 325 (65 Fe) MG TABS Take 1 tablet by mouth daily.  . fluticasone (FLONASE) 50 MCG/ACT nasal spray INSTILL 1 TO 2 SPRAYS INTO EACH NOSTRIL ONCE A DAY  . gabapentin (NEURONTIN) 300 MG capsule Take 1 capsule (300 mg total) by mouth 2 (two) times daily.  . hydrochlorothiazide (MICROZIDE) 12.5 MG capsule TAKE 1 CAPSULE(12.5 MG) BY MOUTH DAILY  . L-Methylfolate-Algae (DEPLIN 15 PO) Take 1 capsule by mouth daily.  Marland Kitchen loratadine (CLARITIN) 10 MG tablet Take 10 mg by mouth daily.  . Multiple Vitamins-Minerals (ONE-A-DAY MENS 50+ PO) Take 1 tablet by mouth daily.  Marland Kitchen omeprazole (PRILOSEC) 20 MG capsule Take 20 mg by mouth daily.  Marland Kitchen tiZANidine (ZANAFLEX) 4 MG tablet Take 4 mg by mouth as needed for muscle spasms.   Marland Kitchen triamcinolone (KENALOG) 0.025 % cream Apply 1 application topically 2 (two) times daily.  Marland Kitchen VASCEPA 1 g capsule TAKE 2 CAPSULES(2 GRAMS) BY MOUTH TWICE DAILY  . vitamin B-12 (CYANOCOBALAMIN) 1000 MCG tablet Take 1,000 mcg by mouth daily.  . vitamin C (ASCORBIC ACID) 500 MG tablet Take 500 mg by mouth daily.  . [DISCONTINUED] atorvastatin (LIPITOR) 80 MG tablet TAKE 1 TABLET(80 MG) BY MOUTH DAILY  . [DISCONTINUED] losartan (COZAAR) 50 MG tablet TAKE 1 TABLET BY MOUTH DAILY  . atorvastatin (LIPITOR) 80 MG tablet TAKE 1 TABLET(80 MG) BY MOUTH DAILY  . CALCIUM PO Take 1,000 mg by mouth daily. (Patient not taking: Reported on 10/23/2020)  . losartan (COZAAR) 50 MG tablet Take 1 tablet (50 mg total) by mouth daily.  . ondansetron (ZOFRAN) 4 MG tablet Take 4 mg by mouth every 8 (eight) hours as needed for nausea or vomiting. (Patient not  taking: Reported on 10/23/2020)  . oxyCODONE (OXY IR/ROXICODONE) 5 MG immediate release tablet Take 1 tablet (5 mg total) by mouth every 6 (six) hours as needed for moderate pain or severe pain. (Patient not taking: Reported on 10/23/2020)   No facility-administered encounter medications on file as of 10/23/2020.   Allergies  Allergen Reactions  . Meloxicam Itching   ROS: no fever, chills, headaches, dizziness, chest pain, shortness of breath, edema, urinary complaints. No bleeding, bruising Back pain per HPI. Mild constipation   PHYSICAL EXAM:  BP 130/70   Pulse 72   Ht 5' 4"  (1.626 m)   Wt 254 lb 6.4 oz (115.4 kg)   LMP 02/09/2009 (Exact Date)   BMI 43.67 kg/m   Wt Readings from Last 3 Encounters:  10/23/20 254 lb 6.4 oz (115.4 kg)  10/03/20 259 lb 14.8 oz (117.9 kg)  09/11/20 257 lb 1.6 oz (116.6 kg)    Pleasantfemale in no distress HEENT: conjunctiva and sclera are clear, EOMI. Wearing mask. Neck: no  lymphadenopathy, thyromegaly or mass, no bruit Heart: regular rate and rhythm, no murmur Lungs: clear, no wheezing Back: no CVA tenderness. No spinal tenderness, muscle spasm Abdomen: soft, obese, nontender Extremities: no edema Skin:normal turgor.  Scars R axilla/breast healing well.  On her posterior scalp there are a couple of areas that are slightly pink/erythematous.  No thickening of skin or plaque some mild flaking.  One small papule noted. Neuro: alert and oriented, normal gait Psych:[place] contact, speech, hygiene and grooming   Lab Results  Component Value Date   HGBA1C 6.0 (A) 10/23/2020    ASSESSMENT/PLAN:  Prediabetes - remains stable, off metformin. Reviewed proper diet, exercise, wt loss recommended - Plan: HgB A1c  Malignant neoplasm of upper-outer quadrant of right breast in female, estrogen receptor positive ([place])  Need for COVID-19 vaccine - Plan: PFIZER Comirnaty(GRAY TOP)COVID-19 Vaccine  Mixed hyperlipidemia - Cont current regimen,  low cholesterol diet - Plan: atorvastatin (LIPITOR) 80 MG tablet  Essential hypertension - controlled on current regimen - Plan: losartan (COZAAR) 50 MG tablet  Rash - nonspecific dermatitis.  Not c/w eczema or psoriasis.  Trial of TAC. Consider seeing derm if persists/worsens - Plan: triamcinolone (KENALOG) 0.025 % cream   F/u 6  Months, fasting (lipid at visit)

## 2020-10-23 ENCOUNTER — Ambulatory Visit (INDEPENDENT_AMBULATORY_CARE_PROVIDER_SITE_OTHER): Payer: BC Managed Care – PPO | Admitting: Family Medicine

## 2020-10-23 ENCOUNTER — Encounter: Payer: Self-pay | Admitting: Family Medicine

## 2020-10-23 ENCOUNTER — Other Ambulatory Visit: Payer: Self-pay

## 2020-10-23 VITALS — BP 130/70 | HR 72 | Ht 64.0 in | Wt 254.4 lb

## 2020-10-23 DIAGNOSIS — R21 Rash and other nonspecific skin eruption: Secondary | ICD-10-CM

## 2020-10-23 DIAGNOSIS — E782 Mixed hyperlipidemia: Secondary | ICD-10-CM | POA: Diagnosis not present

## 2020-10-23 DIAGNOSIS — Z23 Encounter for immunization: Secondary | ICD-10-CM | POA: Diagnosis not present

## 2020-10-23 DIAGNOSIS — I10 Essential (primary) hypertension: Secondary | ICD-10-CM

## 2020-10-23 DIAGNOSIS — C50411 Malignant neoplasm of upper-outer quadrant of right female breast: Secondary | ICD-10-CM

## 2020-10-23 DIAGNOSIS — R7303 Prediabetes: Secondary | ICD-10-CM | POA: Diagnosis not present

## 2020-10-23 DIAGNOSIS — Z170 Estrogen receptor positive status [ER+]: Secondary | ICD-10-CM

## 2020-10-23 LAB — POCT GLYCOSYLATED HEMOGLOBIN (HGB A1C): Hemoglobin A1C: 6 % — AB (ref 4.0–5.6)

## 2020-10-23 MED ORDER — LOSARTAN POTASSIUM 50 MG PO TABS: 1.0000 | ORAL_TABLET | Freq: Every day | ORAL | 1 refills | Status: DC

## 2020-10-23 MED ORDER — TRIAMCINOLONE ACETONIDE 0.025 % EX CREA: 1.0000 "application " | TOPICAL_CREAM | Freq: Two times a day (BID) | CUTANEOUS | 0 refills | Status: DC

## 2020-10-23 MED ORDER — ATORVASTATIN CALCIUM 80 MG PO TABS: ORAL_TABLET | ORAL | 1 refills | Status: DC

## 2020-10-24 ENCOUNTER — Encounter: Payer: Self-pay | Admitting: Physical Therapy

## 2020-10-24 ENCOUNTER — Ambulatory Visit: Payer: BC Managed Care – PPO | Attending: Surgery | Admitting: Physical Therapy

## 2020-10-24 DIAGNOSIS — Z170 Estrogen receptor positive status [ER+]: Secondary | ICD-10-CM

## 2020-10-24 DIAGNOSIS — C50411 Malignant neoplasm of upper-outer quadrant of right female breast: Secondary | ICD-10-CM | POA: Insufficient documentation

## 2020-10-24 DIAGNOSIS — Z483 Aftercare following surgery for neoplasm: Secondary | ICD-10-CM | POA: Diagnosis not present

## 2020-10-24 DIAGNOSIS — R293 Abnormal posture: Secondary | ICD-10-CM | POA: Diagnosis not present

## 2020-10-24 NOTE — Therapy (Signed)
[place], [place], [address] Phone: [phone]   Fax:  [phone]  Physical Therapy Treatment  Patient Details  Name: Susan Gould MRN: [id number] Date of Birth: [birth year] Referring Provider (PT): Dr. [name]   Encounter Date: 10/24/2020   PT End of Session - 10/24/20 0952    Visit Number 2    Number of Visits 2    PT Start Time 0910    PT Stop Time 0942    PT Time Calculation (min) 32 min    Activity Tolerance Patient tolerated treatment well    Behavior During Therapy [facility] for tasks assessed/performed           Past Medical History:  Diagnosis Date  . Allergic rhinitis, cause unspecified    cat/dog/ragweed/mold/wool, s/p immunotherapy x 3-4 yrs  . Anxiety 2006  . Asthma    (Dr. Joya Gaskins)  . Breast cancer [facility]) January 2022  . Colon polyps 2007   Dr. Oletta Lamas (she was told repeat in 10 years)  . Depression 2006  . Endometriosis    Dr. Leo Grosser  . Family history of breast cancer in female   . GERD (gastroesophageal reflux disease)   . Heart murmur   . Hemorrhoid    internal and external  . Hyperlipidemia   . Hypertension   . Insulin resistance    Dr. Leo Grosser  . Medial meniscus tear    Left. Surgery [name].  NOT seen on MRI 06/2013  . Obesity   . OSA (obstructive sleep apnea)    on CPAP (Dr. Gwenette Greet)    Past Surgical History:  Procedure Laterality Date  . BREAST LUMPECTOMY WITH SENTINEL LYMPH NODE BIOPSY Right 10/03/2020   Procedure: RIGHT BREAST LUMPECTOMY WITH SENTINEL LYMPH NODE BIOPSY;  Surgeon: [name], MD;  Location: [place];  Service: General;  Laterality: Right;  . CARPAL TUNNEL RELEASE Right 12/28/2019   Dr. Percell Miller  . CARPAL TUNNEL RELEASE Left 02/22/2020  . CHOLECYSTECTOMY  1986  . EYE SURGERY  2000  . FINGER GANGLION CYST EXCISION  2003   RIGHT 3rd finger  . JOINT REPLACEMENT  2017  . KNEE SURGERY  '95 and '97    BILATERAL KNEE  . LAPAROSCOPY  '98, '01   endometriosis (Dr. Leo Grosser)  . LASIK  2000  . tibial tuberoplasty  90's   left    There were no vitals filed for this visit.   Subjective Assessment - 10/24/20 0912    Subjective Patient underwent a right lumpectomy and sentinel node biopsy (1 negative) on 10/03/2020. Oncotype score was 25 so no chemotherapy needed. Radiation will be scheduled soon foloowed by anti-estrogen therapy..    Pertinent History Patient was diagnosed on 09/02/2020 with right grade II invasive ductal carcinoma breast cancer. She underwent a right lumpectomy and sentinel node biopsy (1 negative) on 10/03/2020. It is ER/PR positive and HER2 negative with a Ki67 of 30%.    Patient Stated Goals See how my arm is doing    Currently in Pain? Yes    Pain Score 3     Pain Location Axilla    Pain Orientation Right    Pain Descriptors / Indicators Numbness    Pain Type Surgical pain    Pain Onset 1 to 4 weeks ago    Pain Frequency Intermittent    Aggravating Factors  Nothing    Pain Relieving Factors Nothing  [facility] PT Assessment - 10/24/20 0001      Assessment   Medical Diagnosis s/p right lumpectomy and SLNB    Referring Provider (PT) Dr. [name]    Onset Date/Surgical Date 10/03/20    Hand Dominance Right    Prior Therapy Baselines      Precautions   Precautions Other (comment)    Precaution Comments recent surgery and right arm lymphedema risk      Restrictions   Weight Bearing Restrictions No      Balance Screen   Has the patient fallen in the past 6 months No    Has the patient had a decrease in activity level because of a fear of falling?  No    Is the patient reluctant to leave their home because of a fear of falling?  No      Home Environment   Living Environment Private residence    Living Arrangements Alone    Available Help at Discharge Family      Prior Function   Level of Independence Independent    Vocation Full time  employment    Vocation Requirements Medical transcription    Leisure She is walking 1x/week due to pollen and back pain so she is limited functionally with this due to pain      Cognition   Overall Cognitive Status Within Functional Limits for tasks assessed      Observation/Other Assessments   Observations Incisions appear to both be very well healed. Breast incision with some scar thickening but no redness or edema noted.      Posture/Postural Control   Posture/Postural Control Postural limitations    Postural Limitations Rounded Shoulders;Forward head      ROM / Strength   AROM / PROM / Strength AROM      AROM   AROM Assessment Site Shoulder    Right/Left Shoulder Right    Right Shoulder Extension 47 Degrees    Right Shoulder Flexion 147 Degrees    Right Shoulder ABduction 178 Degrees    Right Shoulder Internal Rotation 50 Degrees    Right Shoulder External Rotation 90 Degrees      Strength   Overall Strength Within functional limits for tasks performed             LYMPHEDEMA/ONCOLOGY QUESTIONNAIRE - 10/24/20 0001      Type   Cancer Type Right breast cancer      Surgeries   Lumpectomy Date 10/03/20    Sentinel Lymph Node Biopsy Date 10/03/20    Number Lymph Nodes Removed 1      Treatment   Active Chemotherapy Treatment No    Past Chemotherapy Treatment No    Active Radiation Treatment No    Past Radiation Treatment No    Current Hormone Treatment No    Past Hormone Therapy No      What other symptoms do you have   Are you Having Heaviness or Tightness No    Are you having Pain No    Are you having pitting edema No    Is it Hard or Difficult finding clothes that fit No    Do you have infections No    Is there Decreased scar mobility Yes    [place]mer Sign No      Lymphedema Assessments   Lymphedema Assessments Upper extremities      Right Upper Extremity Lymphedema   10 cm Proximal to Olecranon Process 39.2 cm    Olecranon Process 30 cm  10 cm  Proximal to Ulnar Styloid Process 28.3 cm    Just Proximal to Ulnar Styloid Process 17.8 cm    Across Hand at [employer] 20.9 cm    At [place] of 2nd Digit 6.4 cm      Left Upper Extremity Lymphedema   10 cm Proximal to Olecranon Process 37.3 cm    Olecranon Process 29.6 cm    10 cm Proximal to Ulnar Styloid Process 26.8 cm    Just Proximal to Ulnar Styloid Process 17.6 cm    Across Hand at [employer] 20 cm    At [place] of 2nd Digit 6.4 cm              Quick Dash - 10/24/20 0001    Open a tight or new jar No difficulty    Do heavy household chores (wash walls, wash floors) No difficulty    Carry a shopping bag or briefcase No difficulty    Wash your back No difficulty    Use a knife to cut food No difficulty    Recreational activities in which you take some force or impact through your arm, shoulder, or hand (golf, hammering, tennis) No difficulty    During the past week, to what extent has your arm, shoulder or hand problem interfered with your normal social activities with family, friends, neighbors, or groups? Not at all    During the past week, to what extent has your arm, shoulder or hand problem limited your work or other regular daily activities Not at all    Arm, shoulder, or hand pain. None    Tingling (pins and needles) in your arm, shoulder, or hand None    Difficulty Sleeping No difficulty    DASH Score 0 %                          PT Education - 10/24/20 0952    Education Details Aftercare; scar massage; lymphedema education    Person(s) Educated Patient    Methods Explanation;Demonstration;Handout    Comprehension Returned demonstration;Verbalized understanding               PT Long Term Goals - 10/24/20 0956      PT LONG TERM GOAL #1   Title Patient will demonstrate she has regained full shoulder ROM and function post operatively compared to baselines.    Time 8    Period Weeks    Status Achieved                 Plan  - 10/24/20 0953    Clinical Impression Statement Patient is doing very well s/p right lumpectomy and sentinel node biopsy on 10/03/2020 with 1 negative node removed. She had an Oncotype test and scored 25 so she will proceed to radiation. She has regained full shoulder ROM and function, shows no signs of lympehdema and her incisions are healing well. She plans to attend the After Breast Cancer class on 10/28/2020 but otherwise has no PT needs at this time.    PT Treatment/Interventions ADLs/Self Care Home Management;Therapeutic exercise;Patient/family education    PT Next Visit Plan D/C    PT Home Exercise Plan Post op shoulder ROM HEP    Consulted and Agree with Plan of Care Patient           Patient will benefit from skilled therapeutic intervention in order to improve the following deficits and impairments:  Decreased knowledge of precautions,Impaired  UE functional use,Pain,Postural dysfunction,Decreased range of motion  Visit Diagnosis: Malignant neoplasm of upper-outer quadrant of right breast in female, estrogen receptor positive ([place])  Abnormal posture  Aftercare following surgery for neoplasm     Problem List Patient Active Problem List   Diagnosis Date Noted  . Malignant neoplasm of upper-outer quadrant of right breast in female, estrogen receptor positive ([place]) 09/09/2020  . Steatohepatitis 07/25/2019  . Acute exacerbation of moderate persistent extrinsic asthma 06/02/2016  . Class 3 obesity due to excess calories with serious comorbidity and body mass index (BMI) of 45.0 to 49.9 in adult 04/23/2016  . Pre-operative clearance 09/23/2015  . Mixed hyperlipidemia 07/29/2011  . OA (osteoarthritis) of knee 07/29/2011  . Morbid obesity with body mass index of 45.0-49.9 in adult [facility]) 03/17/2010  . Insulin resistance syndrome 11/13/2009  . HYPERLIPIDEMIA 11/13/2009  . Anxiety state 11/13/2009  . Essential hypertension 11/13/2009  . Moderate persistent asthma in adult without  complication 07/61/5183  . GERD 11/13/2009  . ENDOMETRIOSIS 11/13/2009  . OSA (obstructive sleep apnea) 11/13/2009   PHYSICAL THERAPY DISCHARGE SUMMARY  Visits from Start of Care: 2  Current functional level related to goals / functional outcomes: Goals met. See above for objective measurements.   Remaining deficits: None   Education / Equipment: Aftercare; lymphedema risk reduction. Plan: Patient agrees to discharge.  Patient goals were met. Patient is being discharged due to meeting the stated rehab goals.  ?????         Annia Friendly, [place] 10/24/20 9:58 AM   [place], [place], [address] Phone: [phone]   Fax:  [phone]  Name: Susan Gould MRN: [id number] Date of Birth: Nov 21, 1965

## 2020-10-24 NOTE — Patient Instructions (Addendum)
            [facility]         [address], [place] [address]         [phone]         Annia Friendly, PT, CLT   After Breast Cancer Class It is recommended you attend the ABC class to be educated on lymphedema risk reduction. This class is free of charge and lasts for 1 hour. It is a 1-time class.  You are scheduled for 10/28/2020. We will send you a link for the Webex.  Scar massage You can begin gentle scar massage using coconut oil or vitamin E cream a few minutes each day to both incisions.   Home exercise Program Continue your exercises for your arm until you finish radiation. Begin 30 minutes of cardio/day - starting wherever you need to.   Follow up PT: It is recommended you return every 3 months for the first 3 years following surgery to be assessed on the SOZO machine for an L-Dex score. This helps prevent clinically significant lymphedema in 95% of patients. These follow up screens are 15 minute appointments that you are not billed for. You are scheduled for 01/06/2021 at 8:00 for this at our new clinic at [place]. [place] [phone]

## 2020-10-25 ENCOUNTER — Encounter: Payer: Self-pay | Admitting: Family Medicine

## 2020-10-28 ENCOUNTER — Encounter: Payer: Self-pay | Admitting: *Deleted

## 2020-10-28 ENCOUNTER — Other Ambulatory Visit: Payer: Self-pay | Admitting: Family Medicine

## 2020-10-28 ENCOUNTER — Other Ambulatory Visit: Payer: Self-pay | Admitting: *Deleted

## 2020-10-28 ENCOUNTER — Encounter (HOSPITAL_COMMUNITY): Payer: Self-pay

## 2020-10-28 DIAGNOSIS — M545 Low back pain, unspecified: Secondary | ICD-10-CM

## 2020-10-28 DIAGNOSIS — Z170 Estrogen receptor positive status [ER+]: Secondary | ICD-10-CM

## 2020-10-28 DIAGNOSIS — G8929 Other chronic pain: Secondary | ICD-10-CM

## 2020-10-28 DIAGNOSIS — C50411 Malignant neoplasm of upper-outer quadrant of right female breast: Secondary | ICD-10-CM

## 2020-10-28 DIAGNOSIS — F419 Anxiety disorder, unspecified: Secondary | ICD-10-CM | POA: Diagnosis not present

## 2020-10-28 DIAGNOSIS — F3289 Other specified depressive episodes: Secondary | ICD-10-CM | POA: Diagnosis not present

## 2020-10-28 NOTE — Telephone Encounter (Signed)
Is this okay to refill? 

## 2020-10-30 ENCOUNTER — Encounter: Payer: Self-pay | Admitting: Radiation Oncology

## 2020-10-30 ENCOUNTER — Other Ambulatory Visit: Payer: Self-pay

## 2020-10-30 ENCOUNTER — Ambulatory Visit
Admission: RE | Admit: 2020-10-30 | Discharge: 2020-10-30 | Disposition: A | Discharge status: Home / Self Care | Payer: BC Managed Care – PPO | Source: Ambulatory Visit | Attending: Radiation Oncology | Admitting: Radiation Oncology

## 2020-10-30 VITALS — BP 145/81 | HR 94 | Temp 96.8°F | Resp 18 | Ht 64.0 in | Wt 258.2 lb

## 2020-10-30 DIAGNOSIS — C50411 Malignant neoplasm of upper-outer quadrant of right female breast: Secondary | ICD-10-CM

## 2020-10-30 DIAGNOSIS — Z170 Estrogen receptor positive status [ER+]: Secondary | ICD-10-CM

## 2020-10-30 DIAGNOSIS — Z79899 Other long term (current) drug therapy: Secondary | ICD-10-CM | POA: Insufficient documentation

## 2020-10-30 DIAGNOSIS — Z791 Long term (current) use of non-steroidal anti-inflammatories (NSAID): Secondary | ICD-10-CM | POA: Insufficient documentation

## 2020-10-30 NOTE — Progress Notes (Signed)
Radiation Oncology         [phone] ________________________________  Name: Susan Gould MRN: [id number]  Date: 10/30/2020  DOB: 03-30-1966  Re-Evaluation Note  CC: Rita Ohara, MD  Magrinat, Virgie Dad, MD    ICD-10-CM   1. Malignant neoplasm of upper-outer quadrant of right breast in female, estrogen receptor positive ([place])  C50.411    Z17.0     Diagnosis:  Stage IA,  Right Breast UOQ, Invasive Ductal Carcinoma, ER+ / PR+ / Her2-, Grade 2  Narrative:  The patient returns today to discuss radiation treatment options. She was seen in the multidisciplinary breast clinic on 09/11/2020, at which time it was recommended that she proceed with right lumpectomy and sentinel node procedure, possible Oncotype depending on final tumor size, adjuvant radiation therapy, and adjuvant hormonal therapy with an aromatase inhibitor.  The patient underwent a right breast lumpectomy with deep right axillary sentinel lymph node biopsy on 10/03/2020 under the care of Dr. Ninfa Linden. Pathology from the procedure revealed grade 2 invasive ductal carcinoma spanning 1.1 cm with intermediate grade DCIS. There were also noted to be calcifications associated with the carcinoma. Margins were uninvolved by carcinoma, the closest being <0.1 cm from the anterior margin. However, lymphovascular space invasion was present. One right axillary sentinel lymph node was excised and was negative for carcinoma.  Oncotype DX was obtained on the final surgical sample and the recurrence score of 25 predicted a risk of recurrence outside the breast over the next 9 years of 12%, if the patient's only systemic therapy was an antiestrogen for 5 years. It also predicted no significant benefit from chemotherapy.  On review of systems, the patient reports no complaints. She denies lymphedema, pain, and any other symptoms.    Allergies:  is allergic to meloxicam.  Meds: Current Outpatient Medications  Medication Sig Dispense Refill   . ALPRAZolam (XANAX) 0.5 MG tablet TAKE 1/2 -1 TABLET BY MOUTH 3 TIMES DAILY AS NEEDED FOR ANXIETY 20 tablet 0  . amLODipine (NORVASC) 5 MG tablet TAKE 1 AND 1/2 TABLETS(7.5 MG) BY MOUTH DAILY 135 tablet 0  . ARNUITY ELLIPTA 100 MCG/ACT AEPB INHALE 1 PUFF INTO THE LUNGS DAILY 30 each 3  . atorvastatin (LIPITOR) 80 MG tablet TAKE 1 TABLET(80 MG) BY MOUTH DAILY 90 tablet 1  . CALCIUM PO Take 1,000 mg by mouth daily.    . cholecalciferol (VITAMIN D3) 25 MCG (1000 UNIT) tablet Take 1,000 Units by mouth daily.    Marland Kitchen Desvenlafaxine Succinate (PRISTIQ PO) Take 100 mg by mouth daily.    . diclofenac (VOLTAREN) 75 MG EC tablet TAKE 1 TABLET(75 MG) BY MOUTH TWICE DAILY WITH FOOD AS NEEDED FOR MILD PAIN OR MODERATE PAIN 60 tablet 0  . Ferrous Sulfate (IRON) 325 (65 Fe) MG TABS Take 1 tablet by mouth daily.    . fluticasone (FLONASE) 50 MCG/ACT nasal spray INSTILL 1 TO 2 SPRAYS INTO EACH NOSTRIL ONCE A DAY 16 g 11  . gabapentin (NEURONTIN) 300 MG capsule Take 1 capsule (300 mg total) by mouth 2 (two) times daily. 180 capsule 1  . hydrochlorothiazide (MICROZIDE) 12.5 MG capsule TAKE 1 CAPSULE(12.5 MG) BY MOUTH DAILY 90 capsule 0  . L-Methylfolate-Algae (DEPLIN 15 PO) Take 1 capsule by mouth daily.    Marland Kitchen loratadine (CLARITIN) 10 MG tablet Take 10 mg by mouth daily.    Marland Kitchen losartan (COZAAR) 50 MG tablet Take 1 tablet (50 mg total) by mouth daily. 90 tablet 1  . Multiple Vitamins-Minerals (ONE-A-DAY MENS  50+ PO) Take 1 tablet by mouth daily.    Marland Kitchen omeprazole (PRILOSEC) 20 MG capsule Take 20 mg by mouth daily.    Marland Kitchen tiZANidine (ZANAFLEX) 4 MG tablet Take 4 mg by mouth as needed for muscle spasms.     Marland Kitchen triamcinolone (KENALOG) 0.025 % cream Apply 1 application topically 2 (two) times daily. 30 g 0  . VASCEPA 1 g capsule TAKE 2 CAPSULES(2 GRAMS) BY MOUTH TWICE DAILY 120 capsule 2  . vitamin B-12 (CYANOCOBALAMIN) 1000 MCG tablet Take 1,000 mcg by mouth daily.    . vitamin C (ASCORBIC ACID) 500 MG tablet Take 500 mg by  mouth daily.    . ondansetron (ZOFRAN) 4 MG tablet Take 4 mg by mouth every 8 (eight) hours as needed for nausea or vomiting. (Patient not taking: No sig reported)    . oxyCODONE (OXY IR/ROXICODONE) 5 MG immediate release tablet Take 1 tablet (5 mg total) by mouth every 6 (six) hours as needed for moderate pain or severe pain. (Patient not taking: No sig reported) 25 tablet 0   No current facility-administered medications for this encounter.    Physical Findings: The patient is in no acute distress. Patient is alert and oriented.  height is 5' 4" (1.626 m) and weight is 258 lb 4 oz (117.1 kg). Her temporal temperature is 96.8 F (36 C) (abnormal). Her blood pressure is 145/81 (abnormal) and her pulse is 94. Her respiration is 18 and oxygen saturation is 99%.  No significant changes. Lungs are clear to auscultation bilaterally. Heart has regular rate and rhythm. No palpable cervical, supraclavicular, or axillary adenopathy. Abdomen soft, non-tender, normal bowel sounds. Right breast: Lumpectomy scar and sentinel node scar both healed well without signs of drainage or infection.  No significant seroma noted in the breast.  Lab Findings: Lab Results  Component Value Date   WBC 8.0 09/11/2020   HGB 12.4 09/11/2020   HCT 37.7 09/11/2020   MCV 90.8 09/11/2020   PLT 262 09/11/2020    Radiographic Findings: NM Sentinel Node Inj-No Rpt (Breast)  Result Date: 10/03/2020 Sulfur colloid was injected by the nuclear medicine technologist for melanoma sentinel node.    Impression: Stage IA,  Right Breast UOQ, Invasive Ductal Carcinoma, ER+ / PR+ / Her2-, Grade 2  The patient would be a good candidate for adjuvant radiation therapy directed to the right breast.  Since the lymph nodes returned negative she would be a candidate for hypofractionated accelerated radiation therapy over approximately 4 weeks.  I discussed the course of radiation therapy side effects and potential toxicities of radiation  therapy in the situation with the patient and one of her sisters.  She appears to understand and wishes to proceed with planned course of treatment.  Plan:  Patient is scheduled for CT simulation tomorrow.  Anticipate 4 weeks of radiation therapy  Total time spent in this encounter was 35 minutes which included reviewing the patient's most recent lumpectomy, pathology report, oncoptype, physical examination, and documentation.  -----------------------------------  [name], PhD, MD  This document serves as a record of services personally performed by Gery Pray, MD. It was created on his behalf by Clerance Lav, a trained medical scribe. The creation of this record is based on the scribe's personal observations and the provider's statements to them. This document has been checked and approved by the attending provider.

## 2020-10-30 NOTE — Progress Notes (Signed)
See MD note for nursing evaluation. °

## 2020-10-30 NOTE — Progress Notes (Signed)
Location of Breast Cancer: Malignant neoplasm of upper-outer quadrant of right breast in female, estrogen receptor positive ([place]) - Primary  Histology per Pathology Report: 10/03/2020   Receptor Status: 09/02/2020    Did patient present with symptoms (if so, please note symptoms) or was this found on screening mammography?:    Past/Anticipated interventions by surgeon, if any: 10/03/2020   Past/Anticipated interventions by medical oncology, if any:    Lymphedema issues, if any:  no    Pain issues, if any:  no   SAFETY ISSUES:  Prior radiation? no  Pacemaker/ICD? no  Possible current pregnancy? no  Is the patient on methotrexate? no  Current Complaints / other details:  None   Vitals:   10/30/20 1455  BP: (!) 145/81  Pulse: 94  Resp: 18  Temp: (!) 96.8 F (36 C)  TempSrc: Temporal  SpO2: 99%  Weight: 258 lb 4 oz (117.1 kg)  Height: 5' 4"  (1.626 m)  ,

## 2020-10-31 ENCOUNTER — Ambulatory Visit
Admission: RE | Admit: 2020-10-31 | Discharge: 2020-10-31 | Disposition: A | Discharge status: Home / Self Care | Payer: BC Managed Care – PPO | Source: Ambulatory Visit | Attending: Radiation Oncology | Admitting: Radiation Oncology

## 2020-10-31 DIAGNOSIS — C50411 Malignant neoplasm of upper-outer quadrant of right female breast: Secondary | ICD-10-CM | POA: Insufficient documentation

## 2020-10-31 DIAGNOSIS — Z510 Encounter for antineoplastic radiation therapy: Secondary | ICD-10-CM | POA: Insufficient documentation

## 2020-10-31 DIAGNOSIS — Z170 Estrogen receptor positive status [ER+]: Secondary | ICD-10-CM

## 2020-11-05 ENCOUNTER — Encounter: Payer: Self-pay | Admitting: *Deleted

## 2020-11-05 DIAGNOSIS — F3289 Other specified depressive episodes: Secondary | ICD-10-CM | POA: Diagnosis not present

## 2020-11-05 DIAGNOSIS — Z510 Encounter for antineoplastic radiation therapy: Secondary | ICD-10-CM | POA: Diagnosis not present

## 2020-11-05 DIAGNOSIS — C50411 Malignant neoplasm of upper-outer quadrant of right female breast: Secondary | ICD-10-CM | POA: Diagnosis not present

## 2020-11-05 DIAGNOSIS — Z170 Estrogen receptor positive status [ER+]: Secondary | ICD-10-CM | POA: Diagnosis not present

## 2020-11-05 DIAGNOSIS — F419 Anxiety disorder, unspecified: Secondary | ICD-10-CM | POA: Diagnosis not present

## 2020-11-07 ENCOUNTER — Other Ambulatory Visit: Payer: Self-pay

## 2020-11-07 ENCOUNTER — Encounter: Payer: Self-pay | Admitting: Oncology

## 2020-11-07 ENCOUNTER — Ambulatory Visit
Admission: RE | Admit: 2020-11-07 | Discharge: 2020-11-07 | Disposition: A | Discharge status: Home / Self Care | Payer: BC Managed Care – PPO | Source: Ambulatory Visit | Attending: Radiation Oncology | Admitting: Radiation Oncology

## 2020-11-07 DIAGNOSIS — Z170 Estrogen receptor positive status [ER+]: Secondary | ICD-10-CM

## 2020-11-07 DIAGNOSIS — Z510 Encounter for antineoplastic radiation therapy: Secondary | ICD-10-CM | POA: Diagnosis not present

## 2020-11-07 DIAGNOSIS — C50411 Malignant neoplasm of upper-outer quadrant of right female breast: Secondary | ICD-10-CM

## 2020-11-08 ENCOUNTER — Ambulatory Visit
Admission: RE | Admit: 2020-11-08 | Discharge: 2020-11-08 | Disposition: A | Discharge status: Home / Self Care | Payer: BC Managed Care – PPO | Source: Ambulatory Visit | Attending: Radiation Oncology | Admitting: Radiation Oncology

## 2020-11-08 DIAGNOSIS — C50411 Malignant neoplasm of upper-outer quadrant of right female breast: Secondary | ICD-10-CM | POA: Diagnosis not present

## 2020-11-08 DIAGNOSIS — Z170 Estrogen receptor positive status [ER+]: Secondary | ICD-10-CM | POA: Diagnosis not present

## 2020-11-08 DIAGNOSIS — Z510 Encounter for antineoplastic radiation therapy: Secondary | ICD-10-CM | POA: Diagnosis not present

## 2020-11-11 ENCOUNTER — Ambulatory Visit
Admission: RE | Admit: 2020-11-11 | Discharge: 2020-11-11 | Disposition: A | Discharge status: Home / Self Care | Payer: BC Managed Care – PPO | Source: Ambulatory Visit | Attending: Radiation Oncology | Admitting: Radiation Oncology

## 2020-11-11 DIAGNOSIS — Z170 Estrogen receptor positive status [ER+]: Secondary | ICD-10-CM | POA: Diagnosis not present

## 2020-11-11 DIAGNOSIS — F3289 Other specified depressive episodes: Secondary | ICD-10-CM | POA: Diagnosis not present

## 2020-11-11 DIAGNOSIS — C50411 Malignant neoplasm of upper-outer quadrant of right female breast: Secondary | ICD-10-CM | POA: Diagnosis not present

## 2020-11-11 DIAGNOSIS — Z510 Encounter for antineoplastic radiation therapy: Secondary | ICD-10-CM | POA: Diagnosis not present

## 2020-11-11 DIAGNOSIS — F419 Anxiety disorder, unspecified: Secondary | ICD-10-CM | POA: Diagnosis not present

## 2020-11-11 NOTE — Progress Notes (Signed)
[facility]  Telephone:(336) [phone] Fax:(336) [phone]     ID: Susan Gould DOB: [birth year]  MR#: [id number]  DDU#:[id number]  Patient Care Team: Rita Ohara, MD as PCP - General (Family Medicine) [name], MD as Consulting Physician (General Surgery) Ulyssa Walthour, Virgie Dad, MD as Consulting Physician (Oncology) Gery Pray, MD as Consulting Physician (Radiation Oncology) Laroy Apple, MD as Consulting Physician (Physical Medicine and Rehabilitation) Everett Graff, MD as Consulting Physician (Obstetrics and Gynecology) Arta Silence, MD as Consulting Physician (Gastroenterology) Deneise Lever, MD as Consulting Physician (Pulmonary Disease) Danice Goltz, MD as Consulting Physician (Ophthalmology) Chauncey Cruel, MD OTHER MD:  CHIEF COMPLAINT: estrogen receptor positive breast cancer  CURRENT TREATMENT: adjuvant radiation; to start antiestrogens   INTERVAL HISTORY: Susan Gould returns today for follow up of her estrogen receptor positive breast cancer. She was evaluated in the multidisciplinary breast cancer clinic on 09/11/2020.  She proceeded to right lumpectomy on 10/03/2020 under Dr. Ninfa Linden. Pathology from the procedure ([id number]) showed: invasive ductal carcinoma, grade 2, 1.1 cm; ductal carcinoma in situ, intermediate grade; calcifications associated with carcinoma; margins uninvolved; lymphovascular space invasion present.  The biopsied lymph node was negative for carcinoma.  Oncotype DX was obtained on the final surgical sample and the recurrence score of 25 predicts a risk of recurrence outside the breast over the next 9 years of 12%, if the patient's only systemic therapy is an antiestrogen for 5 years.  It also predicts no benefit from chemotherapy.   She was referred back to Dr. Sondra Come on 10/30/2020 to review radiation therapy. She subsequently began treatment on 11/07/2020 and is scheduled to finish on 12/06/2020.   REVIEW  OF SYSTEMS: Susan Gould did well with her surgery and is very pleased with the cosmetic results.  She is tolerating radiation well so far but she is already beginning to have some soreness and unusual sensations in that breast.  There have been no unusual headaches visual changes cough phlegm production pleurisy shortness of breath or change in bowel or bladder habits.  Right now she is anxious because she is taking care of her sister's dog who has congestive heart failure is on diuretics and needs to urinate frequently which really takes all her attention.  For exercise she is walking as much as she can.  She did her ABCs, went through [place], and met with our physical therapy folks.   COVID 19 VACCINATION STATUS: fully vaccinated [employer]), with booster 03/2020   HISTORY OF CURRENT ILLNESS: From the original intake note:  Susan Gould herself palpated a right breast abnormality. Of note, screening mammogram on 05/31/2020 was negative. She underwent right diagnostic mammography with tomography and right breast ultrasonography at [facility] on 08/29/2020 showing: breast density category B; palpable indeterminate 0.6 cm mass in right breast at 12 o'clock retroareolar region; right axilla negative for adenopathy.  Accordingly on 09/02/2020 she proceeded to biopsy of the right breast area in question. The pathology from this procedure ([id number]) showed: invasive ductal carcinoma, grade 2; ductal papilloma. Prognostic indicators significant for: estrogen receptor, >95% positive with strong staining intensity and progesterone receptor, 15% positive with moderate staining intensity. Proliferation marker Ki67 at 30%. HER2 equivocal by immunohistochemistry (2+), but negative by fluorescent in situ hybridization with a signals ratio 0.92 and number per cell 1.7.  Cancer Staging Malignant neoplasm of upper-outer quadrant of right breast in female, estrogen receptor positive ([place]) Staging form: Breast, AJCC  8th Edition - Clinical stage from 09/11/2020: Stage IA (cT1b, cN0, cM0,  G2, ER+, PR+, HER2-) - Signed by Chauncey Cruel, MD on 09/11/2020 Stage prefix: Initial diagnosis Histologic grading system: 3 grade system Laterality: Right Staged by: Pathologist and managing physician Stage used in treatment planning: Yes National guidelines used in treatment planning: Yes Type of national guideline used in treatment planning: NCCN  The patient's subsequent history is as detailed below.   PAST MEDICAL HISTORY: Past Medical History:  Diagnosis Date  . Allergic rhinitis, cause unspecified    cat/dog/ragweed/mold/wool, s/p immunotherapy x 3-4 yrs  . Anxiety 2006  . Asthma    (Dr. Joya Gaskins)  . Breast cancer [facility]) January 2022  . Colon polyps 2007   Dr. Oletta Lamas (she was told repeat in 10 years)  . Depression 2006  . Endometriosis    Dr. Leo Grosser  . Family history of breast cancer in female   . GERD (gastroesophageal reflux disease)   . Heart murmur   . Hemorrhoid    internal and external  . Hyperlipidemia   . Hypertension   . Insulin resistance    Dr. Leo Grosser  . Medial meniscus tear    Left. Surgery [name].  NOT seen on MRI 06/2013  . Obesity   . OSA (obstructive sleep apnea)    on CPAP (Dr. Gwenette Greet)    PAST SURGICAL HISTORY: Past Surgical History:  Procedure Laterality Date  . BREAST LUMPECTOMY WITH SENTINEL LYMPH NODE BIOPSY Right 10/03/2020   Procedure: RIGHT BREAST LUMPECTOMY WITH SENTINEL LYMPH NODE BIOPSY;  Surgeon: [name], MD;  Location: [place];  Service: General;  Laterality: Right;  . CARPAL TUNNEL RELEASE Right 12/28/2019   Dr. Percell Miller  . CARPAL TUNNEL RELEASE Left 02/22/2020  . CHOLECYSTECTOMY  1986  . EYE SURGERY  2000  . FINGER GANGLION CYST EXCISION  2003   RIGHT 3rd finger  . JOINT REPLACEMENT  2017  . KNEE SURGERY  '95 and '97   BILATERAL KNEE  . LAPAROSCOPY  '98, '01   endometriosis (Dr. Leo Grosser)  . LASIK  2000  . tibial  tuberoplasty  90's   left    FAMILY HISTORY: Family History  Problem Relation Age of Onset  . Diabetes Mother   . Hypertension Mother   . Hyperlipidemia Mother   . Arthritis Mother   . Cancer Mother 41       breast cancer  . Breast cancer Mother 40  . Diabetes Father   . Hyperlipidemia Father   . Hypertension Father   . Asthma Father   . Arthritis Father   . Heart disease Father        CHF, hypokinesis  . COPD Father        due to asbestos exposure; former smoker  . Pulmonary embolism Father   . Parkinson's disease Father   . Hypertension Sister   . Colon cancer Maternal Grandmother        late 71's  . Cancer Maternal Grandmother        colon  . Heart disease Maternal Grandmother        MI in 49's  . Breast cancer Paternal Aunt        29's  . Cancer Paternal Aunt        breast  . Heart disease Paternal Uncle   . Diabetes Paternal Uncle   . Kidney disease Paternal Uncle   Her father died at age 31 from COPD and Parkinson's. Her mother died at age 51 from a perforated colon. Kashira has one brother and three sisters,  all living as of 08/2020. She reports breast cancer in her mother at age 75 and in a paternal aunt. She also notes colon cancer in her maternal grandmother in her early 7's.    GYNECOLOGIC HISTORY:  Patient's last menstrual period was 02/09/2009 (exact date). Menarche: 55 years old Susan Gould P 0 LMP 01/2009 Contraceptive: used "for years" and stopped in the early 2000's. HRT never used  Hysterectomy? no BSO? 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 She is single. She lives by herself, with her cat.  She is not a Ambulance person.    ADVANCED DIRECTIVES: not in place; believes she has named her brother Courtny Bennison, who lives in [place] and can be reached at [phone]. Her sister [name] seconds.   HEALTH MAINTENANCE: Social History   Tobacco Use  .  Smoking status: Never Smoker  . Smokeless tobacco: Never Used  Vaping Use  . Vaping Use: Never used  Substance Use Topics  . Alcohol use: Yes    Alcohol/week: 0.0 standard drinks    Comment: 1-2 drinks a month  . Drug use: No     Colonoscopy: 05/2018 (Dr. Oletta Lamas), normal, recall 2024 for family history  PAP: 06/2020, negative  Bone density: 03/2015   Allergies  Allergen Reactions  . Meloxicam Itching and Rash    Current Outpatient Medications  Medication Sig Dispense Refill  . anastrozole (ARIMIDEX) 1 MG tablet Take 1 tablet (1 mg total) by mouth daily. Start December 31, 2020 90 tablet 4  . ALPRAZolam (XANAX) 0.5 MG tablet TAKE 1/2 -1 TABLET BY MOUTH 3 TIMES DAILY AS NEEDED FOR ANXIETY 20 tablet 0  . amLODipine (NORVASC) 5 MG tablet TAKE 1 AND 1/2 TABLETS(7.5 MG) BY MOUTH DAILY 135 tablet 0  . ARNUITY ELLIPTA 100 MCG/ACT AEPB INHALE 1 PUFF INTO THE LUNGS DAILY 30 each 3  . atorvastatin (LIPITOR) 80 MG tablet TAKE 1 TABLET(80 MG) BY MOUTH DAILY 90 tablet 1  . CALCIUM PO Take 1,000 mg by mouth daily.    . cholecalciferol (VITAMIN D3) 25 MCG (1000 UNIT) tablet Take 1,000 Units by mouth daily.    Marland Kitchen Desvenlafaxine Succinate (PRISTIQ PO) Take 100 mg by mouth daily.    . diclofenac (VOLTAREN) 75 MG EC tablet TAKE 1 TABLET(75 MG) BY MOUTH TWICE DAILY WITH FOOD AS NEEDED FOR MILD PAIN OR MODERATE PAIN 60 tablet 0  . Ferrous Sulfate (IRON) 325 (65 Fe) MG TABS Take 1 tablet by mouth daily.    . fluticasone (FLONASE) 50 MCG/ACT nasal spray INSTILL 1 TO 2 SPRAYS INTO EACH NOSTRIL ONCE A DAY 16 g 11  . gabapentin (NEURONTIN) 300 MG capsule Take 1 capsule (300 mg total) by mouth 2 (two) times daily. 180 capsule 1  . hydrochlorothiazide (MICROZIDE) 12.5 MG capsule TAKE 1 CAPSULE(12.5 MG) BY MOUTH DAILY 90 capsule 0  . L-Methylfolate-Algae (DEPLIN 15 PO) Take 1 capsule by mouth daily.    Marland Kitchen loratadine (CLARITIN) 10 MG tablet Take 10 mg by mouth daily.    Marland Kitchen losartan (COZAAR) 50 MG tablet Take 1 tablet  (50 mg total) by mouth daily. 90 tablet 1  . Multiple Vitamins-Minerals (ONE-A-DAY MENS 50+ PO) Take 1 tablet by mouth daily.    Marland Kitchen omeprazole (PRILOSEC) 20 MG capsule Take 20 mg by mouth daily.    . ondansetron (ZOFRAN) 4 MG tablet Take 4 mg by mouth every 8 (eight) hours as needed for nausea  or vomiting. (Patient not taking: No sig reported)    . oxyCODONE (OXY IR/ROXICODONE) 5 MG immediate release tablet Take 1 tablet (5 mg total) by mouth every 6 (six) hours as needed for moderate pain or severe pain. (Patient not taking: No sig reported) 25 tablet 0  . tiZANidine (ZANAFLEX) 4 MG tablet Take 4 mg by mouth as needed for muscle spasms.     Marland Kitchen triamcinolone (KENALOG) 0.025 % cream Apply 1 application topically 2 (two) times daily. 30 g 0  . VASCEPA 1 g capsule TAKE 2 CAPSULES(2 GRAMS) BY MOUTH TWICE DAILY 120 capsule 2  . vitamin B-12 (CYANOCOBALAMIN) 1000 MCG tablet Take 1,000 mcg by mouth daily.    . vitamin C (ASCORBIC ACID) 500 MG tablet Take 500 mg by mouth daily.     No current facility-administered medications for this visit.    OBJECTIVE: White woman who appears stated age  55:   11/12/20 1024  BP: (!) 141/71  Pulse: 79  Resp: 18  Temp: 97.9 F (36.6 C)  SpO2: 98%     Body mass index is 44.71 kg/m.   Wt Readings from Last 3 Encounters:  11/12/20 260 lb 8 oz (118.2 kg)  10/30/20 258 lb 4 oz (117.1 kg)  10/23/20 254 lb 6.4 oz (115.4 kg)      ECOG FS:1 - Symptomatic but completely ambulatory  Sclerae unicteric, EOMs intact Wearing a mask No cervical or supraclavicular adenopathy Lungs no rales or rhonchi Heart regular rate and rhythm Abd soft, nontender, positive bowel sounds MSK no focal spinal tenderness, no upper extremity lymphedema Neuro: nonfocal, well oriented, appropriate affect Breasts: The right breast is status post recent lumpectomy and is currently receiving radiation.  The cosmetic result is excellent.  There is no dehiscence erythema or swelling, no  skin changes of concern.  The left breast and both axillae are benign   LAB RESULTS:  CMP     Component Value Date/Time   NA 136 09/11/2020 0830   NA 141 06/20/2020 1002   K 4.0 09/11/2020 0830   CL 101 09/11/2020 0830   CO2 26 09/11/2020 0830   GLUCOSE 122 (H) 09/11/2020 0830   BUN 16 09/11/2020 0830   BUN 16 06/20/2020 1002   CREATININE 1.04 (H) 09/11/2020 0830   CREATININE 0.97 03/09/2017 1110   CALCIUM 9.8 09/11/2020 0830   PROT 8.0 09/11/2020 0830   PROT 7.7 06/20/2020 1002   ALBUMIN 4.3 09/11/2020 0830   ALBUMIN 4.6 06/20/2020 1002   AST 49 (H) 09/11/2020 0830   ALT 58 (H) 09/11/2020 0830   ALKPHOS 98 09/11/2020 0830   BILITOT 0.4 09/11/2020 0830   GFRNONAA >60 09/11/2020 0830   GFRAA 77 06/20/2020 1002    No results found for: TOTALPROTELP, ALBUMINELP, A1GS, A2GS, BETS, BETA2SER, GAMS, MSPIKE, SPEI  Lab Results  Component Value Date   WBC 8.0 09/11/2020   NEUTROABS 4.5 09/11/2020   HGB 12.4 09/11/2020   HCT 37.7 09/11/2020   MCV 90.8 09/11/2020   PLT 262 09/11/2020    No results found for: LABCA2  No components found for: WUJWJX914  No results for input(s): INR in the last 168 hours.  No results found for: LABCA2  No results found for: NWG956  No results found for: OZH086  No results found for: VHQ469  No results found for: CA2729  No components found for: HGQUANT  No results found for: CEA1 / No results found for: CEA1   No results found for: AFPTUMOR  No  results found for: CHROMOGRNA  No results found for: KPAFRELGTCHN, LAMBDASER, KAPLAMBRATIO (kappa/lambda light chains)  No results found for: HGBA, HGBA2QUANT, HGBFQUANT, HGBSQUAN (Hemoglobinopathy evaluation)   No results found for: LDH  Lab Results  Component Value Date   IRON 60 05/03/2019   TIBC 316 05/03/2019   IRONPCTSAT 19 05/03/2019   (Iron and TIBC)  Lab Results  Component Value Date   FERRITIN 42 05/03/2019    Urinalysis    Component Value Date/Time    BILIRUBINUR neg 03/12/2016 0854   PROTEINUR neg 03/12/2016 0854   UROBILINOGEN negative 03/12/2016 0854   NITRITE neg 03/12/2016 0854   LEUKOCYTESUR Negative 03/12/2016 0854    STUDIES: No results found.   ELIGIBLE FOR AVAILABLE RESEARCH PROTOCOL:no  ASSESSMENT: 55 y.o. [place] woman status post right breast upper outer quadrant biopsy 09/02/2020 for a clinical T1b N0, stage IA invasive ductal carcinoma, grade 2, estrogen and progesterone receptor positive, HER-2 not amplified, with an MIB-1 of 30%  (1) status post right lumpectomy and sentinel lymph node sampling 10/03/2020 for a pT1c pN0, stage IA invasive ductal carcinoma, grade 2, with negative margins  (a) a single right axillary lymph node was removed  (2) Oncotype score of 25 predicted a risk of recurrence outside the breast in the next 9 years of 12% if the patient's only systemic therapy is antiestrogens for 5 years.  It predicts a less than 1% chemotherapy benefit  (3) adjuvant radiation to be completed 12/06/2020  (4) to start anastrozole 12/31/2020  (a) DEXA scan 04/22/2015  (b) DEXA scan September or October 2022, with new baseline mammogram   PLAN: I reviewed the pathology results and the Oncotype results with [place] so she has a very good understanding of her prognosis as indicated by these results.  She is tolerating radiation well.  She will be completing these treatments on December 06, 2020  Today we discussed the difference between tamoxifen and anastrozole in detail. She understands that anastrozole and the aromatase inhibitors in general work by blocking estrogen production. Accordingly vaginal dryness, decrease in bone density, and of course hot flashes can result. The aromatase inhibitors can also negatively affect the cholesterol profile, although that is a minor effect. One out of 5 women on aromatase inhibitors we will feel "old and achy". This arthralgia/myalgia syndrome, which resembles fibromyalgia  clinically, does resolve with stopping the medications. Accordingly this is not a reason to not try an aromatase inhibitor but it is a frequent reason to stop it (in other words 20% of women will not be able to tolerate these medications).  Tamoxifen on the other hand does not block estrogen production. It does not "take away a woman's estrogen". It blocks the estrogen receptor in breast cells. Like anastrozole, it can also cause hot flashes. As opposed to anastrozole, tamoxifen has many estrogen-like effects. It is technically an estrogen receptor modulator. This means that in some tissues tamoxifen works like estrogen-- for example it helps strengthen the bones. It tends to improve the cholesterol profile. It can cause thickening of the endometrial lining, and even endometrial polyps or rarely cancer of the uterus.(The risk of uterine cancer due to tamoxifen is one additional cancer per thousand women year). It can cause vaginal wetness or stickiness. It can cause blood clots through this estrogen-like effect--the risk of blood clots with tamoxifen is exactly the same as with birth control pills or hormone replacement.  Neither of these agents causes mood changes or weight gain, despite the popular belief that they  can have these side effects. We have data from studies comparing either of these drugs with placebo, and in those cases the control group had the same amount of weight gain and depression as the group that took the drug.  The plan after this discussion is to start anastrozole 12/31/2020.  She will have a virtual visit with me about a month later to make sure she is tolerating it well.  At that time we will set her up for a new baseline mammogram and we will repeat a bone density then  She knows to call for any other issue that may develop before then  Total encounter time 35 minutes.Sarajane Jews C. Teffany Blaszczyk, MD 11/12/2020 10:59 AM Medical Oncology and Hematology [facility] [place], [place] [address] Tel. [phone]    Fax. [phone]   This document serves as a record of services personally performed by Lurline Del, MD. It was created on his behalf by Wilburn Mylar, a trained medical scribe. The creation of this record is based on the scribe's personal observations and the provider's statements to them.   I, Lurline Del MD, have reviewed the above documentation for accuracy and completeness, and I agree with the above.   *Total Encounter Time as defined by the Centers for Medicare and Medicaid Services includes, in addition to the face-to-face time of a patient visit (documented in the note above) non-face-to-face time: obtaining and reviewing outside history, ordering and reviewing medications, tests or procedures, care coordination (communications with other health care professionals or caregivers) and documentation in the medical record.

## 2020-11-12 ENCOUNTER — Other Ambulatory Visit: Payer: Self-pay

## 2020-11-12 ENCOUNTER — Ambulatory Visit
Admission: RE | Admit: 2020-11-12 | Discharge: 2020-11-12 | Disposition: A | Discharge status: Home / Self Care | Payer: BC Managed Care – PPO | Source: Ambulatory Visit | Attending: Radiation Oncology | Admitting: Radiation Oncology

## 2020-11-12 ENCOUNTER — Inpatient Hospital Stay: Payer: BC Managed Care – PPO | Attending: Oncology | Admitting: Oncology

## 2020-11-12 VITALS — BP 141/71 | HR 79 | Temp 97.9°F | Resp 18 | Ht 64.0 in | Wt 260.5 lb

## 2020-11-12 DIAGNOSIS — Z510 Encounter for antineoplastic radiation therapy: Secondary | ICD-10-CM | POA: Diagnosis not present

## 2020-11-12 DIAGNOSIS — C50411 Malignant neoplasm of upper-outer quadrant of right female breast: Secondary | ICD-10-CM

## 2020-11-12 DIAGNOSIS — Z170 Estrogen receptor positive status [ER+]: Secondary | ICD-10-CM | POA: Insufficient documentation

## 2020-11-12 MED ORDER — ALRA NON-METALLIC DEODORANT (RAD-ONC)
1.0000 | Freq: Once | TOPICAL | Status: AC
Start: 2020-11-12 — End: 2020-11-12
  Administered 2020-11-12: 1 via TOPICAL

## 2020-11-12 MED ORDER — RADIAPLEXRX EX GEL
Freq: Once | CUTANEOUS | Status: AC
Start: 2020-11-12 — End: 2020-11-12

## 2020-11-12 MED ORDER — ANASTROZOLE 1 MG PO TABS: 1.0000 mg | ORAL_TABLET | Freq: Every day | ORAL | 4 refills | Status: DC

## 2020-11-12 NOTE — Progress Notes (Signed)
Pt here for patient teaching.    Pt given Radiation and You booklet, skin care instructions, Alra deodorant and Radiaplex gel.    Reviewed areas of pertinence such as fatigue, hair loss, skin changes, breast tenderness and breast swelling .   Pt able to give teach back of to pat skin and use unscented/gentle soap,apply Radiaplex bid, avoid applying anything to skin within 4 hours of treatment, avoid wearing an under wire bra and to use an electric razor if they must shave.   Pt demonstrated understanding of information given and will contact nursing with any questions or concerns.    Http://rtanswers.org/treatmentinformation/whattoexpect/index

## 2020-11-13 ENCOUNTER — Telehealth: Payer: Self-pay | Admitting: Oncology

## 2020-11-13 ENCOUNTER — Ambulatory Visit
Admission: RE | Admit: 2020-11-13 | Discharge: 2020-11-13 | Disposition: A | Discharge status: Home / Self Care | Payer: BC Managed Care – PPO | Source: Ambulatory Visit | Attending: Radiation Oncology | Admitting: Radiation Oncology

## 2020-11-13 DIAGNOSIS — C50411 Malignant neoplasm of upper-outer quadrant of right female breast: Secondary | ICD-10-CM | POA: Diagnosis not present

## 2020-11-13 DIAGNOSIS — Z170 Estrogen receptor positive status [ER+]: Secondary | ICD-10-CM | POA: Diagnosis not present

## 2020-11-13 DIAGNOSIS — Z510 Encounter for antineoplastic radiation therapy: Secondary | ICD-10-CM | POA: Diagnosis not present

## 2020-11-13 NOTE — Telephone Encounter (Signed)
Scheduled appointment per 05/17 los. Patient is aware

## 2020-11-14 ENCOUNTER — Ambulatory Visit
Admission: RE | Admit: 2020-11-14 | Discharge: 2020-11-14 | Disposition: A | Discharge status: Home / Self Care | Payer: BC Managed Care – PPO | Source: Ambulatory Visit | Attending: Radiation Oncology | Admitting: Radiation Oncology

## 2020-11-14 DIAGNOSIS — C50411 Malignant neoplasm of upper-outer quadrant of right female breast: Secondary | ICD-10-CM | POA: Diagnosis not present

## 2020-11-14 DIAGNOSIS — Z170 Estrogen receptor positive status [ER+]: Secondary | ICD-10-CM | POA: Diagnosis not present

## 2020-11-14 DIAGNOSIS — Z510 Encounter for antineoplastic radiation therapy: Secondary | ICD-10-CM | POA: Diagnosis not present

## 2020-11-15 ENCOUNTER — Ambulatory Visit
Admission: RE | Admit: 2020-11-15 | Discharge: 2020-11-15 | Disposition: A | Discharge status: Home / Self Care | Payer: BC Managed Care – PPO | Source: Ambulatory Visit | Attending: Radiation Oncology | Admitting: Radiation Oncology

## 2020-11-15 DIAGNOSIS — Z170 Estrogen receptor positive status [ER+]: Secondary | ICD-10-CM | POA: Diagnosis not present

## 2020-11-15 DIAGNOSIS — Z510 Encounter for antineoplastic radiation therapy: Secondary | ICD-10-CM | POA: Diagnosis not present

## 2020-11-15 DIAGNOSIS — C50411 Malignant neoplasm of upper-outer quadrant of right female breast: Secondary | ICD-10-CM | POA: Diagnosis not present

## 2020-11-18 ENCOUNTER — Ambulatory Visit
Admission: RE | Admit: 2020-11-18 | Discharge: 2020-11-18 | Disposition: A | Discharge status: Home / Self Care | Payer: BC Managed Care – PPO | Source: Ambulatory Visit | Attending: Radiation Oncology | Admitting: Radiation Oncology

## 2020-11-18 ENCOUNTER — Other Ambulatory Visit: Payer: Self-pay

## 2020-11-18 ENCOUNTER — Other Ambulatory Visit: Payer: Self-pay | Admitting: Family Medicine

## 2020-11-18 DIAGNOSIS — C50411 Malignant neoplasm of upper-outer quadrant of right female breast: Secondary | ICD-10-CM | POA: Diagnosis not present

## 2020-11-18 DIAGNOSIS — F419 Anxiety disorder, unspecified: Secondary | ICD-10-CM | POA: Diagnosis not present

## 2020-11-18 DIAGNOSIS — E782 Mixed hyperlipidemia: Secondary | ICD-10-CM

## 2020-11-18 DIAGNOSIS — Z170 Estrogen receptor positive status [ER+]: Secondary | ICD-10-CM | POA: Diagnosis not present

## 2020-11-18 DIAGNOSIS — F3289 Other specified depressive episodes: Secondary | ICD-10-CM | POA: Diagnosis not present

## 2020-11-18 DIAGNOSIS — Z510 Encounter for antineoplastic radiation therapy: Secondary | ICD-10-CM | POA: Diagnosis not present

## 2020-11-19 ENCOUNTER — Ambulatory Visit
Admission: RE | Admit: 2020-11-19 | Discharge: 2020-11-19 | Disposition: A | Discharge status: Home / Self Care | Payer: BC Managed Care – PPO | Source: Ambulatory Visit | Attending: Radiation Oncology | Admitting: Radiation Oncology

## 2020-11-19 DIAGNOSIS — Z170 Estrogen receptor positive status [ER+]: Secondary | ICD-10-CM | POA: Diagnosis not present

## 2020-11-19 DIAGNOSIS — Z510 Encounter for antineoplastic radiation therapy: Secondary | ICD-10-CM | POA: Diagnosis not present

## 2020-11-19 DIAGNOSIS — C50411 Malignant neoplasm of upper-outer quadrant of right female breast: Secondary | ICD-10-CM | POA: Diagnosis not present

## 2020-11-20 ENCOUNTER — Other Ambulatory Visit: Payer: Self-pay

## 2020-11-20 ENCOUNTER — Ambulatory Visit
Admission: RE | Admit: 2020-11-20 | Discharge: 2020-11-20 | Disposition: A | Discharge status: Home / Self Care | Payer: BC Managed Care – PPO | Source: Ambulatory Visit | Attending: Radiation Oncology | Admitting: Radiation Oncology

## 2020-11-20 DIAGNOSIS — Z510 Encounter for antineoplastic radiation therapy: Secondary | ICD-10-CM | POA: Diagnosis not present

## 2020-11-20 DIAGNOSIS — Z170 Estrogen receptor positive status [ER+]: Secondary | ICD-10-CM | POA: Diagnosis not present

## 2020-11-20 DIAGNOSIS — C50411 Malignant neoplasm of upper-outer quadrant of right female breast: Secondary | ICD-10-CM | POA: Diagnosis not present

## 2020-11-21 ENCOUNTER — Ambulatory Visit
Admission: RE | Admit: 2020-11-21 | Discharge: 2020-11-21 | Disposition: A | Discharge status: Home / Self Care | Payer: BC Managed Care – PPO | Source: Ambulatory Visit | Attending: Radiation Oncology | Admitting: Radiation Oncology

## 2020-11-21 DIAGNOSIS — C50411 Malignant neoplasm of upper-outer quadrant of right female breast: Secondary | ICD-10-CM | POA: Diagnosis not present

## 2020-11-21 DIAGNOSIS — Z170 Estrogen receptor positive status [ER+]: Secondary | ICD-10-CM | POA: Diagnosis not present

## 2020-11-21 DIAGNOSIS — Z510 Encounter for antineoplastic radiation therapy: Secondary | ICD-10-CM | POA: Diagnosis not present

## 2020-11-22 ENCOUNTER — Ambulatory Visit
Admission: RE | Admit: 2020-11-22 | Discharge: 2020-11-22 | Disposition: A | Discharge status: Home / Self Care | Payer: BC Managed Care – PPO | Source: Ambulatory Visit | Attending: Radiation Oncology | Admitting: Radiation Oncology

## 2020-11-22 DIAGNOSIS — Z510 Encounter for antineoplastic radiation therapy: Secondary | ICD-10-CM | POA: Diagnosis not present

## 2020-11-22 DIAGNOSIS — C50411 Malignant neoplasm of upper-outer quadrant of right female breast: Secondary | ICD-10-CM | POA: Diagnosis not present

## 2020-11-22 DIAGNOSIS — Z170 Estrogen receptor positive status [ER+]: Secondary | ICD-10-CM | POA: Diagnosis not present

## 2020-11-25 ENCOUNTER — Other Ambulatory Visit: Payer: Self-pay | Admitting: Family Medicine

## 2020-11-25 DIAGNOSIS — I10 Essential (primary) hypertension: Secondary | ICD-10-CM

## 2020-11-26 ENCOUNTER — Other Ambulatory Visit: Payer: Self-pay

## 2020-11-26 ENCOUNTER — Ambulatory Visit
Admission: RE | Admit: 2020-11-26 | Discharge: 2020-11-26 | Disposition: A | Discharge status: Home / Self Care | Payer: BC Managed Care – PPO | Source: Ambulatory Visit | Attending: Radiation Oncology | Admitting: Radiation Oncology

## 2020-11-26 ENCOUNTER — Ambulatory Visit: Payer: BC Managed Care – PPO | Admitting: Radiation Oncology

## 2020-11-26 DIAGNOSIS — Z170 Estrogen receptor positive status [ER+]: Secondary | ICD-10-CM | POA: Diagnosis not present

## 2020-11-26 DIAGNOSIS — C50411 Malignant neoplasm of upper-outer quadrant of right female breast: Secondary | ICD-10-CM | POA: Diagnosis not present

## 2020-11-26 DIAGNOSIS — Z510 Encounter for antineoplastic radiation therapy: Secondary | ICD-10-CM | POA: Diagnosis not present

## 2020-11-27 ENCOUNTER — Encounter: Payer: Self-pay | Admitting: Oncology

## 2020-11-27 ENCOUNTER — Ambulatory Visit
Admission: RE | Admit: 2020-11-27 | Discharge: 2020-11-27 | Disposition: A | Discharge status: Home / Self Care | Payer: BC Managed Care – PPO | Source: Ambulatory Visit | Attending: Radiation Oncology | Admitting: Radiation Oncology

## 2020-11-27 DIAGNOSIS — Z510 Encounter for antineoplastic radiation therapy: Secondary | ICD-10-CM | POA: Insufficient documentation

## 2020-11-27 DIAGNOSIS — Z170 Estrogen receptor positive status [ER+]: Secondary | ICD-10-CM | POA: Insufficient documentation

## 2020-11-27 DIAGNOSIS — C50411 Malignant neoplasm of upper-outer quadrant of right female breast: Secondary | ICD-10-CM | POA: Diagnosis not present

## 2020-11-28 ENCOUNTER — Other Ambulatory Visit: Payer: Self-pay

## 2020-11-28 ENCOUNTER — Ambulatory Visit
Admission: RE | Admit: 2020-11-28 | Discharge: 2020-11-28 | Disposition: A | Discharge status: Home / Self Care | Payer: BC Managed Care – PPO | Source: Ambulatory Visit | Attending: Radiation Oncology | Admitting: Radiation Oncology

## 2020-11-28 ENCOUNTER — Telehealth: Payer: Self-pay | Admitting: *Deleted

## 2020-11-28 DIAGNOSIS — Z510 Encounter for antineoplastic radiation therapy: Secondary | ICD-10-CM | POA: Diagnosis not present

## 2020-11-28 DIAGNOSIS — C50411 Malignant neoplasm of upper-outer quadrant of right female breast: Secondary | ICD-10-CM | POA: Diagnosis not present

## 2020-11-28 DIAGNOSIS — Z170 Estrogen receptor positive status [ER+]: Secondary | ICD-10-CM | POA: Diagnosis not present

## 2020-11-28 NOTE — Telephone Encounter (Signed)
Pt sent records via my chart of prior dexa scan from 2015 done at her GYN office.  Viewable under encounters labeled DEXA.

## 2020-11-29 ENCOUNTER — Ambulatory Visit
Admission: RE | Admit: 2020-11-29 | Discharge: 2020-11-29 | Disposition: A | Discharge status: Home / Self Care | Payer: BC Managed Care – PPO | Source: Ambulatory Visit | Attending: Radiation Oncology | Admitting: Radiation Oncology

## 2020-11-29 DIAGNOSIS — G4733 Obstructive sleep apnea (adult) (pediatric): Secondary | ICD-10-CM | POA: Diagnosis not present

## 2020-11-29 DIAGNOSIS — Z510 Encounter for antineoplastic radiation therapy: Secondary | ICD-10-CM | POA: Diagnosis not present

## 2020-11-29 DIAGNOSIS — C50411 Malignant neoplasm of upper-outer quadrant of right female breast: Secondary | ICD-10-CM | POA: Diagnosis not present

## 2020-11-29 DIAGNOSIS — Z170 Estrogen receptor positive status [ER+]: Secondary | ICD-10-CM | POA: Diagnosis not present

## 2020-12-02 ENCOUNTER — Other Ambulatory Visit: Payer: Self-pay

## 2020-12-02 ENCOUNTER — Ambulatory Visit
Admission: RE | Admit: 2020-12-02 | Discharge: 2020-12-02 | Disposition: A | Discharge status: Home / Self Care | Payer: BC Managed Care – PPO | Source: Ambulatory Visit | Attending: Radiation Oncology | Admitting: Radiation Oncology

## 2020-12-02 DIAGNOSIS — Z510 Encounter for antineoplastic radiation therapy: Secondary | ICD-10-CM | POA: Diagnosis not present

## 2020-12-02 DIAGNOSIS — F419 Anxiety disorder, unspecified: Secondary | ICD-10-CM | POA: Diagnosis not present

## 2020-12-02 DIAGNOSIS — C50411 Malignant neoplasm of upper-outer quadrant of right female breast: Secondary | ICD-10-CM | POA: Diagnosis not present

## 2020-12-02 DIAGNOSIS — Z170 Estrogen receptor positive status [ER+]: Secondary | ICD-10-CM | POA: Diagnosis not present

## 2020-12-02 DIAGNOSIS — F3289 Other specified depressive episodes: Secondary | ICD-10-CM | POA: Diagnosis not present

## 2020-12-03 ENCOUNTER — Ambulatory Visit
Admission: RE | Admit: 2020-12-03 | Discharge: 2020-12-03 | Disposition: A | Discharge status: Home / Self Care | Payer: BC Managed Care – PPO | Source: Ambulatory Visit | Attending: Radiation Oncology | Admitting: Radiation Oncology

## 2020-12-03 DIAGNOSIS — C50411 Malignant neoplasm of upper-outer quadrant of right female breast: Secondary | ICD-10-CM | POA: Diagnosis not present

## 2020-12-03 DIAGNOSIS — Z510 Encounter for antineoplastic radiation therapy: Secondary | ICD-10-CM | POA: Diagnosis not present

## 2020-12-03 DIAGNOSIS — Z170 Estrogen receptor positive status [ER+]: Secondary | ICD-10-CM | POA: Diagnosis not present

## 2020-12-04 ENCOUNTER — Ambulatory Visit
Admission: RE | Admit: 2020-12-04 | Discharge: 2020-12-04 | Disposition: A | Discharge status: Home / Self Care | Payer: BC Managed Care – PPO | Source: Ambulatory Visit | Attending: Radiation Oncology | Admitting: Radiation Oncology

## 2020-12-04 ENCOUNTER — Encounter: Payer: Self-pay | Admitting: *Deleted

## 2020-12-04 ENCOUNTER — Other Ambulatory Visit: Payer: Self-pay

## 2020-12-04 DIAGNOSIS — C50411 Malignant neoplasm of upper-outer quadrant of right female breast: Secondary | ICD-10-CM | POA: Diagnosis not present

## 2020-12-04 DIAGNOSIS — Z510 Encounter for antineoplastic radiation therapy: Secondary | ICD-10-CM | POA: Diagnosis not present

## 2020-12-04 DIAGNOSIS — Z170 Estrogen receptor positive status [ER+]: Secondary | ICD-10-CM | POA: Diagnosis not present

## 2020-12-05 ENCOUNTER — Ambulatory Visit
Admission: RE | Admit: 2020-12-05 | Discharge: 2020-12-05 | Disposition: A | Discharge status: Home / Self Care | Payer: BC Managed Care – PPO | Source: Ambulatory Visit | Attending: Radiation Oncology | Admitting: Radiation Oncology

## 2020-12-05 DIAGNOSIS — Z170 Estrogen receptor positive status [ER+]: Secondary | ICD-10-CM | POA: Diagnosis not present

## 2020-12-05 DIAGNOSIS — Z510 Encounter for antineoplastic radiation therapy: Secondary | ICD-10-CM | POA: Diagnosis not present

## 2020-12-05 DIAGNOSIS — C50411 Malignant neoplasm of upper-outer quadrant of right female breast: Secondary | ICD-10-CM | POA: Diagnosis not present

## 2020-12-06 ENCOUNTER — Ambulatory Visit
Admission: RE | Admit: 2020-12-06 | Discharge: 2020-12-06 | Disposition: A | Discharge status: Home / Self Care | Payer: BC Managed Care – PPO | Source: Ambulatory Visit | Attending: Radiation Oncology | Admitting: Radiation Oncology

## 2020-12-06 ENCOUNTER — Other Ambulatory Visit: Payer: Self-pay

## 2020-12-06 DIAGNOSIS — Z510 Encounter for antineoplastic radiation therapy: Secondary | ICD-10-CM | POA: Diagnosis not present

## 2020-12-06 DIAGNOSIS — C50411 Malignant neoplasm of upper-outer quadrant of right female breast: Secondary | ICD-10-CM | POA: Diagnosis not present

## 2020-12-06 DIAGNOSIS — Z923 Personal history of irradiation: Secondary | ICD-10-CM

## 2020-12-06 DIAGNOSIS — Z170 Estrogen receptor positive status [ER+]: Secondary | ICD-10-CM | POA: Diagnosis not present

## 2020-12-06 HISTORY — DX: Personal history of irradiation: Z92.3

## 2020-12-09 DIAGNOSIS — F3289 Other specified depressive episodes: Secondary | ICD-10-CM | POA: Diagnosis not present

## 2020-12-09 DIAGNOSIS — F419 Anxiety disorder, unspecified: Secondary | ICD-10-CM | POA: Diagnosis not present

## 2020-12-16 DIAGNOSIS — F419 Anxiety disorder, unspecified: Secondary | ICD-10-CM | POA: Diagnosis not present

## 2020-12-16 DIAGNOSIS — F3289 Other specified depressive episodes: Secondary | ICD-10-CM | POA: Diagnosis not present

## 2020-12-21 ENCOUNTER — Other Ambulatory Visit: Payer: Self-pay | Admitting: Family Medicine

## 2020-12-21 DIAGNOSIS — G8929 Other chronic pain: Secondary | ICD-10-CM

## 2020-12-21 DIAGNOSIS — I10 Essential (primary) hypertension: Secondary | ICD-10-CM

## 2020-12-23 DIAGNOSIS — F419 Anxiety disorder, unspecified: Secondary | ICD-10-CM | POA: Diagnosis not present

## 2020-12-23 DIAGNOSIS — F3289 Other specified depressive episodes: Secondary | ICD-10-CM | POA: Diagnosis not present

## 2020-12-23 NOTE — Telephone Encounter (Signed)
Is this okay to refill? 

## 2020-12-27 DIAGNOSIS — U071 COVID-19: Secondary | ICD-10-CM

## 2020-12-27 HISTORY — DX: COVID-19: U07.1

## 2020-12-29 DIAGNOSIS — G4733 Obstructive sleep apnea (adult) (pediatric): Secondary | ICD-10-CM | POA: Diagnosis not present

## 2021-01-03 ENCOUNTER — Encounter: Payer: Self-pay | Admitting: Radiology

## 2021-01-06 ENCOUNTER — Other Ambulatory Visit: Payer: Self-pay

## 2021-01-06 ENCOUNTER — Ambulatory Visit: Payer: BC Managed Care – PPO | Attending: Surgery

## 2021-01-06 DIAGNOSIS — F3289 Other specified depressive episodes: Secondary | ICD-10-CM | POA: Diagnosis not present

## 2021-01-06 DIAGNOSIS — F419 Anxiety disorder, unspecified: Secondary | ICD-10-CM | POA: Diagnosis not present

## 2021-01-06 DIAGNOSIS — Z483 Aftercare following surgery for neoplasm: Secondary | ICD-10-CM | POA: Insufficient documentation

## 2021-01-06 NOTE — Therapy (Signed)
[place], [place], [address] Phone: [phone]   Fax:  [phone]  Physical Therapy Treatment  Patient Details  Name: Susan Gould MRN: [id number] Date of Birth: [birth year] Referring Provider (PT): Dr. [name]   Encounter Date: 01/06/2021   PT End of Session - 01/06/21 0803     Visit Number 2   # unchanged due to screen only   PT Start Time 0756    PT Stop Time 0803    PT Time Calculation (min) 7 min    Activity Tolerance Patient tolerated treatment well    Behavior During Therapy [facility] for tasks assessed/performed             Past Medical History:  Diagnosis Date   Allergic rhinitis, cause unspecified    cat/dog/ragweed/mold/wool, s/p immunotherapy x 3-4 yrs   Anxiety 2006   Asthma    (Dr. Joya Gaskins)   Breast cancer [facility]) 06/2020   Colon polyps 2007   Dr. Oletta Lamas (she was told repeat in 10 years)   Depression 2006   Endometriosis    Dr. Leo Grosser   Family history of breast cancer in female    GERD (gastroesophageal reflux disease)    Heart murmur    Hemorrhoid    internal and external   History of radiation therapy 12/06/2020   right breast 11/07/2020-12/06/2020  Dr Gery Pray   Hyperlipidemia    Hypertension    Insulin resistance    Dr. Leo Grosser   Medial meniscus tear    Left. Surgery [name].  NOT seen on MRI 06/2013   Obesity    OSA (obstructive sleep apnea)    on CPAP (Dr. Gwenette Greet)    Past Surgical History:  Procedure Laterality Date   BREAST LUMPECTOMY WITH SENTINEL LYMPH NODE BIOPSY Right 10/03/2020   Procedure: RIGHT BREAST LUMPECTOMY WITH SENTINEL LYMPH NODE BIOPSY;  Surgeon: [name], MD;  Location: [place];  Service: General;  Laterality: Right;   CARPAL TUNNEL RELEASE Right 12/28/2019   Dr. Percell Miller   CARPAL TUNNEL RELEASE Left 02/22/2020   [place] CYST EXCISION  2003    RIGHT 3rd finger   JOINT REPLACEMENT  2017   KNEE SURGERY  '95 and '97   BILATERAL KNEE   LAPAROSCOPY  '98, '01   endometriosis (Dr. Leo Grosser)   LASIK  2000   tibial tuberoplasty  90's   left    There were no vitals filed for this visit.   Subjective Assessment - 01/06/21 0758     Subjective Pt returns for her 3 month L-Dex screen.    Pertinent History Patient was diagnosed on 09/02/2020 with right grade II invasive ductal carcinoma breast cancer. She underwent a right lumpectomy and sentinel node biopsy (1 negative) on 10/03/2020. It is ER/PR positive and HER2 negative with a Ki67 of 30%.                    L-DEX FLOWSHEETS - 01/06/21 0800       L-DEX LYMPHEDEMA SCREENING   Measurement Type Unilateral    L-DEX MEASUREMENT EXTREMITY Upper Extremity    POSITION  Standing    DOMINANT SIDE Right    At Risk Side Right    BASELINE SCORE (UNILATERAL) -1.2    L-DEX SCORE (UNILATERAL) 1    VALUE CHANGE (UNILAT) 2.2  PT Long Term Goals - 10/24/20 0956       PT LONG TERM GOAL #1   Title Patient will demonstrate she has regained full shoulder ROM and function post operatively compared to baselines.    Time 8    Period Weeks    Status Achieved                   Plan - 01/06/21 0804     Clinical Impression Statement Pt returns for her 3 month L-Dex screen. Her change from baseline of 2.2 is WNLs so no further treatment is required at this time except to cont every 3 month L-Dex screens which pt is agreeable to.    PT Next Visit Plan Cont every 3 month L-Dex screens for up to 2 years from her SLNB.    Consulted and Agree with Plan of Care Patient             Patient will benefit from skilled therapeutic intervention in order to improve the following deficits and impairments:     Visit Diagnosis: Aftercare following surgery for neoplasm     Problem List Patient Active Problem List    Diagnosis Date Noted   Malignant neoplasm of upper-outer quadrant of right breast in female, estrogen receptor positive (HCC) 09/09/2020   Steatohepatitis 07/25/2019   Acute exacerbation of moderate persistent extrinsic asthma 06/02/2016   Class 3 obesity due to excess calories with serious comorbidity and body mass index (BMI) of 45.0 to 49.9 in adult 04/23/2016   Pre-operative clearance 09/23/2015   Mixed hyperlipidemia 07/29/2011   OA (osteoarthritis) of knee 07/29/2011   Morbid obesity with body mass index of 45.0-49.9 in adult (HCC) 03/17/2010   Insulin resistance syndrome 11/13/2009   HYPERLIPIDEMIA 11/13/2009   Anxiety state 11/13/2009   Essential hypertension 11/13/2009   Moderate persistent asthma in adult without complication 11/13/2009   GERD 11/13/2009   ENDOMETRIOSIS 11/13/2009   OSA (obstructive sleep apnea) 11/13/2009    Gould, Susan Ann, PTA 01/06/2021, 8:05 AM  [facility] [address] [place], [place], [address] Phone: [phone]   Fax:  [phone]  Name: Susan Gould MRN: [id number] Date of Birth: 12/28/1965    

## 2021-01-07 NOTE — Progress Notes (Incomplete)
  Radiation Oncology         [phone] ________________________________  Patient Name: Susan Gould MRN: [id number] DOB: May 05, 1966 Referring Physician: Tomi Bamberger EVE (Profile Not Attached) Date of Service: 12/06/2020 [facility], [place]  End Of Treatment Note  Diagnoses: C50.411-Malignant neoplasm of upper-outer quadrant of right female breast  Cancer Staging: Stage IA,  Right Breast UOQ, Invasive Ductal Carcinoma, ER+ / PR+ / Her2-, Grade 2  Intent: Curative  Radiation Treatment Dates: 11/07/2020 through 12/06/2020 Site Technique Total Dose (Gy) Dose per Fx (Gy) Completed Fx Beam Energies  Breast, Right: Breast_Rt 3D 40.05/40.05 2.67 15/15 10X  Breast, Right: Breast_Rt_Bst specialPort 12/12 2 6/6 6E, 9E   Narrative: The patient tolerated radiation therapy relatively well. She  reports having swelling and tenderness to the right breast, as well as mild fatigue near the end of her treatment. During her treatment on 12/03/20, it was noted that the inframammary fold area showed erythema suggestive of yeast infection; she was given antifungal powder for this area.  Upon physical examination, the right breast area shows erythema and hyperpigmentation changes. Redness and peeling to right axilla and irritation under right breast were also noted. She has been using Radiplex as directed.   Plan: The patient will follow-up with radiation oncology in one month .  ________________________________________________ -----------------------------------  [name], PhD, MD  This document serves as a record of services personally performed by Gery Pray, MD. It was created on his behalf by Roney Mans, a trained medical scribe. The creation of this record is based on the scribe's personal observations and the provider's statements to them. This document has been checked and approved by the attending provider.

## 2021-01-07 NOTE — Progress Notes (Signed)
Radiation Oncology         [phone] ________________________________  Name: Susan Gould MRN: [id number]  Date: 01/09/2021  DOB: 1965-11-02  Follow-Up Visit Note  CC: Rita Ohara, MD  Rita Ohara, MD    ICD-10-CM   1. Malignant neoplasm of upper-outer quadrant of right breast in female, estrogen receptor positive ([place])  C50.411    Z17.0       Diagnosis:   Stage IA,  Right Breast UOQ, Invasive Ductal Carcinoma, ER+ / PR+ / Her2-, Grade 2  Interval Since Last Radiation:  1 month and 4 days    Intent: Curative  Radiation Treatment Dates: 11/07/2020 through 12/06/2020 Site Technique Total Dose (Gy) Dose per Fx (Gy) Completed Fx Beam Energies  Breast, Right: Breast_Rt 3D 40.05/40.05 2.67 15/15 10X  Breast, Right: Breast_Rt_Bst specialPort 12/12 2 6/6 6E, 9E   Narrative:  The patient returns today for routine follow-up.  The patient completed her radiation treatment on 12/06/20 of which she tolerated relatively well. She reported swelling and tenderness to the right breast, as well as mild fatigue near the end of her treatment. Upon physical examination at the end of treatment, her right breast area showed erythema and hyperpigmentation changes as well a redness and peeling to right axilla and irritation under her right breast.  The patient otherwise has not received any additional recent imaging or labs relevant to her diagnosis.   She has started Arimidex and seems to be tolerating this well thus far.  She has noticed slight increase in her hot flashes but this not seem to be bothering her too much.  She sleeps well at night.  She does report using 3 fans however.  She denies any residual discomfort or itching along the right breast area.  She denies any problems with swelling in her right arm or hand.  She did undergo the [facility] evaluation recently showing her to be in the clinically "green zone" with no physical therapy needed.    Allergies:  is allergic to  meloxicam.  Meds: Current Outpatient Medications  Medication Sig Dispense Refill   ALPRAZolam (XANAX) 0.5 MG tablet TAKE 1/2 -1 TABLET BY MOUTH 3 TIMES DAILY AS NEEDED FOR ANXIETY 20 tablet 0   amLODipine (NORVASC) 5 MG tablet TAKE 1 AND 1/2 TABLETS(7.5 MG) BY MOUTH DAILY 135 tablet 1   anastrozole (ARIMIDEX) 1 MG tablet Take 1 tablet (1 mg total) by mouth daily. Start December 31, 2020 90 tablet 4   ARNUITY ELLIPTA 100 MCG/ACT AEPB INHALE 1 PUFF INTO THE LUNGS DAILY 30 each 3   atorvastatin (LIPITOR) 80 MG tablet TAKE 1 TABLET(80 MG) BY MOUTH DAILY 90 tablet 1   CALCIUM PO Take 1,000 mg by mouth daily.     cholecalciferol (VITAMIN D3) 25 MCG (1000 UNIT) tablet Take 1,000 Units by mouth daily.     Desvenlafaxine Succinate (PRISTIQ PO) Take 100 mg by mouth daily.     diclofenac (VOLTAREN) 75 MG EC tablet TAKE 1 TABLET(75 MG) BY MOUTH TWICE DAILY WITH FOOD AS NEEDED FOR MILD PAIN OR MODERATE PAIN 60 tablet 2   Ferrous Sulfate (IRON) 325 (65 Fe) MG TABS Take 1 tablet by mouth daily.     fluticasone (FLONASE) 50 MCG/ACT nasal spray INSTILL 1 TO 2 SPRAYS INTO EACH NOSTRIL ONCE A DAY 16 g 11   gabapentin (NEURONTIN) 300 MG capsule TAKE 1 CAPSULE(300 MG) BY MOUTH TWICE DAILY 180 capsule 1   hydrochlorothiazide (MICROZIDE) 12.5 MG capsule TAKE 1 CAPSULE(12.5 MG) BY  MOUTH DAILY 90 capsule 1   L-Methylfolate-Algae (DEPLIN 15 PO) Take 1 capsule by mouth daily.     loratadine (CLARITIN) 10 MG tablet Take 10 mg by mouth daily.     losartan (COZAAR) 50 MG tablet Take 1 tablet (50 mg total) by mouth daily. 90 tablet 1   Multiple Vitamins-Minerals (ONE-A-DAY MENS 50+ PO) Take 1 tablet by mouth daily.     omeprazole (PRILOSEC) 20 MG capsule Take 20 mg by mouth daily.     ondansetron (ZOFRAN) 4 MG tablet Take 4 mg by mouth every 8 (eight) hours as needed for nausea or vomiting.     tiZANidine (ZANAFLEX) 4 MG tablet Take 4 mg by mouth as needed for muscle spasms.      triamcinolone (KENALOG) 0.025 % cream Apply 1  application topically 2 (two) times daily. 30 g 0   VASCEPA 1 g capsule TAKE 2 CAPSULES(2 GRAMS) BY MOUTH TWICE DAILY 120 capsule 5   vitamin B-12 (CYANOCOBALAMIN) 1000 MCG tablet Take 1,000 mcg by mouth daily.     vitamin C (ASCORBIC ACID) 500 MG tablet Take 500 mg by mouth daily.     oxyCODONE (OXY IR/ROXICODONE) 5 MG immediate release tablet Take 1 tablet (5 mg total) by mouth every 6 (six) hours as needed for moderate pain or severe pain. (Patient not taking: No sig reported) 25 tablet 0   No current facility-administered medications for this encounter.    Physical Findings: The patient is in no acute distress. Patient is alert and oriented.  height is _0  (1.626 m) and weight is 260 lb 8 oz (118.2 kg). Her temporal temperature is 96.9 F (36.1 C) (abnormal). Her blood pressure is 139/81 and her pulse is 84. Her respiration is 18 and oxygen saturation is 98%. .  No significant changes. Lungs are clear to auscultation bilaterally. Heart has regular rate and rhythm. No palpable cervical, supraclavicular, or axillary adenopathy. Abdomen soft, non-tender, normal bowel sounds.  Left breast no palpable mass nipple discharge or bleeding.  The right breast is healed well.  Some residual hyperpigmentation changes along the lateral breast tissue.  Mild edema in the breast.  No dominant mass appreciated in the breast nipple discharge or bleeding.   Lab Findings: Lab Results  Component Value Date   WBC 8.0 09/11/2020   HGB 12.4 09/11/2020   HCT 37.7 09/11/2020   MCV 90.8 09/11/2020   PLT 262 09/11/2020    Radiographic Findings: No results found.  Impression:  Stage IA,  Right Breast UOQ, Invasive Ductal Carcinoma, ER+ / PR+ / Her2-, Grade 2  She has recovered well from her radiation therapy.  No signs of recurrence on clinical exam today.  Plan: As needed follow-up in radiation oncology.  The patient will continue periodic follow-ups with medical oncology and will continue on  anastrozole.    ____________________________________  [name], PhD, MD   This document serves as a record of services personally performed by Gery Pray, MD. It was created on his behalf by Roney Mans, a trained medical scribe. The creation of this record is based on the scribe's personal observations and the provider's statements to them. This document has been checked and approved by the attending provider.

## 2021-01-09 ENCOUNTER — Other Ambulatory Visit: Payer: Self-pay

## 2021-01-09 ENCOUNTER — Ambulatory Visit
Admission: RE | Admit: 2021-01-09 | Discharge: 2021-01-09 | Disposition: A | Discharge status: Home / Self Care | Payer: BC Managed Care – PPO | Source: Ambulatory Visit | Attending: Radiation Oncology | Admitting: Radiation Oncology

## 2021-01-09 ENCOUNTER — Encounter: Payer: Self-pay | Admitting: Radiation Oncology

## 2021-01-09 VITALS — BP 139/81 | HR 84 | Temp 96.9°F | Resp 18 | Ht 64.0 in | Wt 260.5 lb

## 2021-01-09 DIAGNOSIS — Z170 Estrogen receptor positive status [ER+]: Secondary | ICD-10-CM

## 2021-01-09 DIAGNOSIS — R232 Flushing: Secondary | ICD-10-CM | POA: Diagnosis not present

## 2021-01-09 DIAGNOSIS — Z791 Long term (current) use of non-steroidal anti-inflammatories (NSAID): Secondary | ICD-10-CM | POA: Diagnosis not present

## 2021-01-09 DIAGNOSIS — Z79899 Other long term (current) drug therapy: Secondary | ICD-10-CM | POA: Insufficient documentation

## 2021-01-09 DIAGNOSIS — C50411 Malignant neoplasm of upper-outer quadrant of right female breast: Secondary | ICD-10-CM | POA: Insufficient documentation

## 2021-01-09 NOTE — Progress Notes (Signed)
She is currently in no pain. .  Pt complains of  no compaints  .   Pt right breast- negative for erythema, breast tenderness, nipple discharge, breast lump, moist desquamation, and dry desquamation.    Pt denies edema over right upper extremity.   Pt continues to apply Radiaplex as directed.  BP 139/81 (BP Location: Left Arm, Patient Position: Sitting)   Pulse 84   Temp (!) 96.9 F (36.1 C) (Temporal)   Resp 18   Ht 5' 4"  (1.626 m)   Wt 260 lb 8 oz (118.2 kg)   LMP 02/09/2009 (Exact Date)   SpO2 98%   BMI 44.71 kg/m   Pt reports Yes No Comments  Tamoxifen []  [x]    Letrozole []  [x]    Arimidex [x]  []    Mammogram [x]  Date:  []  Diagnostic mammogram in March 2022

## 2021-01-13 ENCOUNTER — Encounter: Payer: Self-pay | Admitting: Family Medicine

## 2021-01-13 ENCOUNTER — Telehealth (INDEPENDENT_AMBULATORY_CARE_PROVIDER_SITE_OTHER): Payer: BC Managed Care – PPO | Admitting: Family Medicine

## 2021-01-13 ENCOUNTER — Other Ambulatory Visit: Payer: Self-pay

## 2021-01-13 VITALS — BP 129/74 | HR 79 | Temp 96.5°F | Ht 64.0 in | Wt 250.0 lb

## 2021-01-13 DIAGNOSIS — U071 COVID-19: Secondary | ICD-10-CM | POA: Diagnosis not present

## 2021-01-13 DIAGNOSIS — J4540 Moderate persistent asthma, uncomplicated: Secondary | ICD-10-CM | POA: Diagnosis not present

## 2021-01-13 MED ORDER — MOLNUPIRAVIR EUA 200MG CAPSULE: 4.0000 | ORAL_CAPSULE | Freq: Two times a day (BID) | ORAL | 0 refills | Status: AC

## 2021-01-13 NOTE — Patient Instructions (Addendum)
Take the antiviral medications twice daily for 5 days. Use Mucinex 1255m twice daily (12 hour version). Use Delsym if needed (twice daily) for cough. If you have worsening cough, not adequately controlled with Mucinex, DM and inhalers, please contact [facility]  Let [facility] if cough is worse during the day or at night. Let [facility] if you're needing frequent use of albuterol, due to wheezing and shortness of breath, and we can do a short steroid course.  Eat a bland diet and stay well hydrated. Use Zofran if needed for any nausea/vomiting.  Monitor the tingling in your fingertip.  Do the gentle stretches for you neck as shown (chin down, ear to shoulder, looking over the shoulder).  Isolate through Thursday. On Friday, if you don't have fever and if respiratory symptoms are improving, you can leave isolation, but need to remain masked all the time (for days 6-10, Friday through Tuesday).  Contact [facility] any questions.  I hope you feel better soon!

## 2021-01-13 NOTE — Progress Notes (Signed)
Start time: 4:02 End time: 4:35  Virtual Visit via Video Note  I connected with Susan Gould on 01/13/21 by a video enabled telemedicine application and verified that I am speaking with the correct person using two identifiers.  Location: Patient: home Provider: office   I discussed the limitations of evaluation and management by telemedicine and the availability of in person appointments. The patient expressed understanding and agreed to proceed.  History of Present Illness:  Chief Complaint  Patient presents with   Covid Positive    VIRTUAL covid postive Saturday, symptoms started Friday night into Sat morning. Cough, nasal drainage and runny nose. Had a HA yesterday but gone now. No fever. Does have ST, no body aches. Tip of her left index finger has been tingling.      Friday night she was waking up with PND.  She was going to see family so did a COVID test and it was + (as was the second test). Sx Saturday was nasal congestion, sore throat, drainage and headache. Headache has resolved, but is now coughing. Yesterday the phlegm was clear, and nasal drainage is also clear.  Never had fever.   Pt with asthma.  Yesterday she had slight SOB, took Arnuity, which helped.  She had missed it for a few days. Today she felt slightly winded up the stairs, no different than usual.  Hasn't needed albuterol  She reports she hadn't been as careful in masking when going into stores. No known COVID exposures.   She has noted tingling in L index fingertip x 10d.  No other numbness/tingling, no wrist pain, arm pain, neck pain, no weakness.  Just focal area of tingling in the pad.  Had slight increase in tingling with certain neck stretches.  PMH, PSH, SH reviewed  Outpatient Encounter Medications as of 01/13/2021  Medication Sig Note   acetaminophen (TYLENOL) 650 MG CR tablet Take 1,300 mg by mouth every 8 (eight) hours as needed for pain. 01/13/2021: Last dose 8:30am   ALPRAZolam  (XANAX) 0.5 MG tablet TAKE 1/2 -1 TABLET BY MOUTH 3 TIMES DAILY AS NEEDED FOR ANXIETY    amLODipine (NORVASC) 5 MG tablet TAKE 1 AND 1/2 TABLETS(7.5 MG) BY MOUTH DAILY    anastrozole (ARIMIDEX) 1 MG tablet Take 1 tablet (1 mg total) by mouth daily. Start December 31, 2020    ARNUITY ELLIPTA 100 MCG/ACT AEPB INHALE 1 PUFF INTO THE LUNGS DAILY    atorvastatin (LIPITOR) 80 MG tablet TAKE 1 TABLET(80 MG) BY MOUTH DAILY    CALCIUM PO Take 1,000 mg by mouth daily.    cholecalciferol (VITAMIN D3) 25 MCG (1000 UNIT) tablet Take 1,000 Units by mouth daily.    Desvenlafaxine Succinate (PRISTIQ PO) Take 100 mg by mouth daily.    Dextromethorphan-guaiFENesin (MUCINEX DM MAXIMUM STRENGTH) 60-1200 MG TB12 Take 2 tablets by mouth in the morning and at bedtime. 01/13/2021: Last dose 8:30am   diclofenac (VOLTAREN) 75 MG EC tablet TAKE 1 TABLET(75 MG) BY MOUTH TWICE DAILY WITH FOOD AS NEEDED FOR MILD PAIN OR MODERATE PAIN    fluticasone (FLONASE) 50 MCG/ACT nasal spray INSTILL 1 TO 2 SPRAYS INTO EACH NOSTRIL ONCE A DAY    gabapentin (NEURONTIN) 300 MG capsule TAKE 1 CAPSULE(300 MG) BY MOUTH TWICE DAILY    hydrochlorothiazide (MICROZIDE) 12.5 MG capsule TAKE 1 CAPSULE(12.5 MG) BY MOUTH DAILY    L-Methylfolate-Algae (DEPLIN 15 PO) Take 1 capsule by mouth daily.    loratadine (CLARITIN) 10 MG tablet Take 10 mg by  mouth daily.    losartan (COZAAR) 50 MG tablet Take 1 tablet (50 mg total) by mouth daily.    Multiple Vitamins-Minerals (ONE-A-DAY MENS 50+ PO) Take 1 tablet by mouth daily.    omeprazole (PRILOSEC) 20 MG capsule Take 20 mg by mouth daily.    tiZANidine (ZANAFLEX) 4 MG tablet Take 4 mg by mouth as needed for muscle spasms.     VASCEPA 1 g capsule TAKE 2 CAPSULES(2 GRAMS) BY MOUTH TWICE DAILY    vitamin B-12 (CYANOCOBALAMIN) 1000 MCG tablet Take 1,000 mcg by mouth daily.    vitamin C (ASCORBIC ACID) 500 MG tablet Take 500 mg by mouth daily.    Ferrous Sulfate (IRON) 325 (65 Fe) MG TABS Take 1 tablet by mouth  daily. (Patient not taking: Reported on 01/13/2021)    ondansetron (ZOFRAN) 4 MG tablet Take 4 mg by mouth every 8 (eight) hours as needed for nausea or vomiting. (Patient not taking: Reported on 01/13/2021)    triamcinolone (KENALOG) 0.025 % cream Apply 1 application topically 2 (two) times daily. (Patient not taking: Reported on 01/13/2021)    [DISCONTINUED] oxyCODONE (OXY IR/ROXICODONE) 5 MG immediate release tablet Take 1 tablet (5 mg total) by mouth every 6 (six) hours as needed for moderate pain or severe pain. (Patient not taking: No sig reported)    No facility-administered encounter medications on file as of 01/13/2021.   Allergies  Allergen Reactions   Meloxicam Itching and Rash   ROS:  URI symptoms per HPI. Some nausea yesterday, some diarrhea Sat No chest pain, palpitations No loss of taste/smell. No bleeding, bruising, rash. Focal area of tingling in fingertip per HPI.     Observations/Objective:  BP 129/74   Pulse 79   Temp (!) 96.5 F (35.8 C) (Temporal)   Ht 5' 4"  (1.626 m)   Wt 250 lb (113.4 kg)   LMP 02/09/2009 (Exact Date)   BMI 42.91 kg/m   Pleasant female in no distress. There is some throat-clearing, rare coughing noted during exam, but she appears to be speaking very easily during visit, appears comfortable. Exam is limited due to virtual nature of the visit  Assessment and Plan:  COVID-19 virus infection - high risk due to HTN, obesity, asthma, breast CA. Treat with antiviral, risks/SE reviewed. Sx of complications reviewed, supportive care - Plan: molnupiravir EUA 200 mg CAPS  Moderate persistent asthma in adult without complication - cont arnuity, use albuterol prn. If increased need for inhaler, can contact us for steroid course  Mucinex, Delsym prn. Cont albuterol prn.  Isolate through Thursday, masked d 6-10 if sx improving, afebrile.  Monitor tingling in fingertip. Neck stretches shown.  Bland diet, hydration, zofran prn (pt has at  home).   Follow Up Instructions:    I discussed the assessment and treatment plan with the patient. The patient was provided an opportunity to ask questions and all were answered. The patient agreed with the plan and demonstrated an understanding of the instructions.   The patient was advised to call back or seek an in-person evaluation if the symptoms worsen or if the condition fails to improve as anticipated.  I spent 34 minutes dedicated to the care of this patient, including pre-visit review of records, face to face time, post-visit ordering of testing and documentation.    Vikki Ports, MD

## 2021-01-16 ENCOUNTER — Encounter: Payer: Self-pay | Admitting: Family Medicine

## 2021-01-17 ENCOUNTER — Ambulatory Visit: Payer: BC Managed Care – PPO | Attending: Internal Medicine

## 2021-01-17 DIAGNOSIS — F4322 Adjustment disorder with anxiety: Secondary | ICD-10-CM | POA: Diagnosis not present

## 2021-01-17 DIAGNOSIS — Z20822 Contact with and (suspected) exposure to covid-19: Secondary | ICD-10-CM | POA: Diagnosis not present

## 2021-01-17 DIAGNOSIS — F3342 Major depressive disorder, recurrent, in full remission: Secondary | ICD-10-CM | POA: Diagnosis not present

## 2021-01-18 ENCOUNTER — Telehealth: Payer: Self-pay

## 2021-01-18 LAB — NOVEL CORONAVIRUS, NAA: SARS-CoV-2, NAA: DETECTED — AB

## 2021-01-18 LAB — SPECIMEN STATUS REPORT

## 2021-01-18 LAB — SARS-COV-2, NAA 2 DAY TAT

## 2021-01-18 NOTE — Telephone Encounter (Signed)
P.A. VASCEPA denied, appeal letter typed

## 2021-01-20 DIAGNOSIS — F419 Anxiety disorder, unspecified: Secondary | ICD-10-CM | POA: Diagnosis not present

## 2021-01-20 DIAGNOSIS — F3289 Other specified depressive episodes: Secondary | ICD-10-CM | POA: Diagnosis not present

## 2021-01-27 DIAGNOSIS — F3289 Other specified depressive episodes: Secondary | ICD-10-CM | POA: Diagnosis not present

## 2021-01-27 DIAGNOSIS — F419 Anxiety disorder, unspecified: Secondary | ICD-10-CM | POA: Diagnosis not present

## 2021-01-29 DIAGNOSIS — G4733 Obstructive sleep apnea (adult) (pediatric): Secondary | ICD-10-CM | POA: Diagnosis not present

## 2021-02-03 DIAGNOSIS — F3289 Other specified depressive episodes: Secondary | ICD-10-CM | POA: Diagnosis not present

## 2021-02-03 DIAGNOSIS — F419 Anxiety disorder, unspecified: Secondary | ICD-10-CM | POA: Diagnosis not present

## 2021-02-07 NOTE — Telephone Encounter (Signed)
Appeal denied, only covered for triglycerides over 500 or heart/vascular disease and diabetes.  It doesn't list any preferred meds.  Do you want to switch ?

## 2021-02-08 NOTE — Telephone Encounter (Signed)
If Lovaza is covered, then okay to change.  Please send Rx (same directions 2 gm BID)

## 2021-02-11 ENCOUNTER — Telehealth: Payer: Self-pay

## 2021-02-11 ENCOUNTER — Telehealth: Payer: BC Managed Care – PPO | Admitting: Oncology

## 2021-02-11 MED ORDER — OMEGA-3-ACID ETHYL ESTERS 1 G PO CAPS: 2.0000 g | ORAL_CAPSULE | Freq: Two times a day (BID) | ORAL | 3 refills | Status: DC

## 2021-02-11 NOTE — Telephone Encounter (Signed)
Lovaza wasn't covered so completed P.A. LOVAZA

## 2021-02-11 NOTE — Telephone Encounter (Signed)
Called Lovaza into pharmacy,  they couldn't process right then, will fax me rejection if needs P.A.

## 2021-02-11 NOTE — Telephone Encounter (Signed)
Lovaza wasn't covered either so initiated P.A. LOVAZA

## 2021-02-14 NOTE — Telephone Encounter (Signed)
Lovaza denied also, preferred alternative is Fenofibrate or Gemfibrozil, do you want to switch?

## 2021-02-14 NOTE — Telephone Encounter (Signed)
Pt is on high dose statin, and adding gemfibrozil would increase her risk for myopathy and is not recommended to use together. She has TG >250 despite being on high dose statin.  While she doesn't have DM listed in chart, she likely has DM.  She has been on metformin for many years for pre-diabetes, but likely if she were to stop it, her A1c would rise and qualify her for DM.  She is not a good candidate for fenofibrate or gemfibrozil due to her being on 62m lipitor.  She has been taking Vascepa since I believe 03/2020 and not having issues with it. I'd like for her to be either on Vascepa or Lovaza, whichever you can get approved.

## 2021-02-18 NOTE — Telephone Encounter (Signed)
Another appeal letter written with form letter from [facility] to see if can get this approved.

## 2021-02-24 ENCOUNTER — Encounter: Payer: Self-pay | Admitting: Family Medicine

## 2021-02-24 DIAGNOSIS — F419 Anxiety disorder, unspecified: Secondary | ICD-10-CM | POA: Diagnosis not present

## 2021-02-24 DIAGNOSIS — F3289 Other specified depressive episodes: Secondary | ICD-10-CM | POA: Diagnosis not present

## 2021-02-27 NOTE — Telephone Encounter (Signed)
Recv'd response back from [facility] regarding appeal for Vascepa that pt needs to sign authorization form for appeal process, sent pt mychart message

## 2021-02-28 NOTE — Telephone Encounter (Signed)
Denied and Recv'd response back from [facility] regarding appeal for Vascepa that pt needs to sign authorization form for appeal process, sent pt mychart message

## 2021-03-01 DIAGNOSIS — G4733 Obstructive sleep apnea (adult) (pediatric): Secondary | ICD-10-CM | POA: Diagnosis not present

## 2021-03-05 ENCOUNTER — Telehealth (HOSPITAL_BASED_OUTPATIENT_CLINIC_OR_DEPARTMENT_OTHER): Payer: BC Managed Care – PPO | Admitting: Oncology

## 2021-03-05 DIAGNOSIS — Z170 Estrogen receptor positive status [ER+]: Secondary | ICD-10-CM | POA: Diagnosis not present

## 2021-03-05 DIAGNOSIS — C50411 Malignant neoplasm of upper-outer quadrant of right female breast: Secondary | ICD-10-CM | POA: Diagnosis not present

## 2021-03-05 NOTE — Progress Notes (Signed)
[place]  [phone] [phone]     ID: Susan Gould DOB: 07-Sep-1965  MR#: [id number]  DTO#:[id number]  Patient Care Team: Rita Ohara, MD as PCP - General (Family Medicine) [name], MD as Consulting Physician (General Surgery) Danity Schmelzer, Virgie Dad, MD as Consulting Physician (Oncology) Gery Pray, MD as Consulting Physician (Radiation Oncology) Laroy Apple, MD as Consulting Physician (Physical Medicine and Rehabilitation) Everett Graff, MD as Consulting Physician (Obstetrics and Gynecology) Arta Silence, MD as Consulting Physician (Gastroenterology) Deneise Lever, MD as Consulting Physician (Pulmonary Disease) Danice Goltz, MD as Consulting Physician (Ophthalmology) Chauncey Cruel, MD OTHER MD:  I connected with Courtney Heys on 03/05/21 at  3:15 PM EDT by video enabled telemedicine visit and verified that I am speaking with the correct person using two identifiers.   I discussed the limitations, risks, security and privacy concerns of performing an evaluation and management service by telemedicine and the availability of in-person appointments. I also discussed with the patient that there may be a patient responsible charge related to this service. The patient expressed understanding and agreed to proceed.   Other persons participating in the visit and their role in the encounter: None  Patient's location: Home Provider's location: [place]   I provided 10 minutes of face-to-face video visit time during this encounter, and > 50% was spent counseling as documented under my assessment & plan.   CHIEF COMPLAINT: estrogen receptor positive breast cancer  CURRENT TREATMENT: anastrozole   INTERVAL HISTORY: Susan Gould was contacted today for follow up of her estrogen receptor positive breast cancer.   Since her last visit, she completed radiation therapy on 12/06/2020 under Dr.  Sondra Come.  She started anastrozole on 12/31/2020.  She is generally doing well with this.  She does have hot flashes, sometimes 3 or 4 times a day, sometimes not at all.  They do not wake her up at night.  Vaginal dryness is not an issue.  She provided [place] with her most recent bone density screening on 04/22/2015. Results were normal, with a T-score of +0.5.   REVIEW OF SYSTEMS: A detailed review of systems today was otherwise noncontributory   COVID 19 VACCINATION STATUS: Pfizer x4, most recently 09/2020; infection 12/2020   HISTORY OF CURRENT ILLNESS: From the original intake note:  Susan Gould herself palpated a right breast abnormality. Of note, screening mammogram on 05/31/2020 was negative. She underwent right diagnostic mammography with tomography and right breast ultrasonography at [facility] on 08/29/2020 showing: breast density category B; palpable indeterminate 0.6 cm mass in right breast at 12 o'clock retroareolar region; right axilla negative for adenopathy.  Accordingly on 09/02/2020 she proceeded to biopsy of the right breast area in question. The pathology from this procedure ([id number]) showed: invasive ductal carcinoma, grade 2; ductal papilloma. Prognostic indicators significant for: estrogen receptor, >95% positive with strong staining intensity and progesterone receptor, 15% positive with moderate staining intensity. Proliferation marker Ki67 at 30%. HER2 equivocal by immunohistochemistry (2+), but negative by fluorescent in situ hybridization with a signals ratio 0.92 and number per cell 1.7.  Cancer Staging Malignant neoplasm of upper-outer quadrant of right breast in female, estrogen receptor positive ([place]) Staging form: Breast, AJCC 8th Edition - Clinical stage from 09/11/2020: Stage IA (cT1b, cN0, cM0, G2, ER+, PR+, HER2-) - Signed by Chauncey Cruel, MD on 09/11/2020 Stage prefix: Initial diagnosis Histologic grading system: 3 grade system Laterality:  Right Staged by: Pathologist and managing physician Stage  used in treatment planning: Yes National guidelines used in treatment planning: Yes Type of national guideline used in treatment planning: NCCN  The patient's subsequent history is as detailed below.   PAST MEDICAL HISTORY: Past Medical History:  Diagnosis Date   Allergic rhinitis, cause unspecified    cat/dog/ragweed/mold/wool, s/p immunotherapy x 3-4 yrs   Anxiety 2006   Asthma    (Dr. Joya Gaskins)   Breast cancer [facility]) 06/2020   Colon polyps 2007   Dr. Oletta Lamas (she was told repeat in 10 years)   Depression 2006   Endometriosis    Dr. Leo Grosser   Family history of breast cancer in female    GERD (gastroesophageal reflux disease)    Heart murmur    Hemorrhoid    internal and external   History of radiation therapy 12/06/2020   right breast 11/07/2020-12/06/2020  Dr Gery Pray   Hyperlipidemia    Hypertension    Insulin resistance    Dr. Leo Grosser   Medial meniscus tear    Left. Surgery [name].  NOT seen on MRI 06/2013   Obesity    OSA (obstructive sleep apnea)    on CPAP (Dr. Gwenette Greet)    PAST SURGICAL HISTORY: Past Surgical History:  Procedure Laterality Date   BREAST LUMPECTOMY WITH SENTINEL LYMPH NODE BIOPSY Right 10/03/2020   Procedure: RIGHT BREAST LUMPECTOMY WITH SENTINEL LYMPH NODE BIOPSY;  Surgeon: [name], MD;  Location: [place];  Service: General;  Laterality: Right;   CARPAL TUNNEL RELEASE Right 12/28/2019   Dr. Percell Miller   CARPAL TUNNEL RELEASE Left 02/22/2020   [place] CYST EXCISION  2003   RIGHT 3rd finger   JOINT REPLACEMENT  2017   KNEE SURGERY  '95 and '97   BILATERAL KNEE   LAPAROSCOPY  '98, '01   endometriosis (Dr. Leo Grosser)   LASIK  2000   tibial tuberoplasty  90's   left    FAMILY HISTORY: Family History  Problem Relation Age of Onset   Diabetes Mother    Hypertension Mother    Hyperlipidemia Mother     Arthritis Mother    Cancer Mother 34       breast cancer   Breast cancer Mother 75   Diabetes Father    Hyperlipidemia Father    Hypertension Father    Asthma Father    Arthritis Father    Heart disease Father        CHF, hypokinesis   COPD Father        due to asbestos exposure; former smoker   Pulmonary embolism Father    Parkinson's disease Father    Hypertension Sister    Colon cancer Maternal Grandmother        late 60's   Cancer Maternal Grandmother        colon   Heart disease Maternal Grandmother        MI in 3's   Breast cancer Paternal Aunt        70's   Cancer Paternal Aunt        breast   Heart disease Paternal Uncle    Diabetes Paternal Uncle    Kidney disease Paternal Uncle   Her father died at age 25 from COPD and Parkinson's. Her mother died at age 25 from a perforated colon. Susan Gould has one brother and three sisters, all living as of 08/2020. She reports breast cancer in her mother at age 82 and in  a paternal aunt. She also notes colon cancer in her maternal grandmother in her early 75's.    GYNECOLOGIC HISTORY:  Patient's last menstrual period was 02/09/2009 (exact date). Menarche: 55 years old [place] P 0 LMP 01/2009 Contraceptive: used "for years" and stopped in the early 2000's. HRT never used  Hysterectomy? no BSO? 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 She is single. She lives by herself, with her cat.  She is not a Ambulance person.    ADVANCED DIRECTIVES: not in place; believes she has named her brother Krystianna Soth, who lives in [place] and can be reached at [phone]. Her sister [name] seconds.   HEALTH MAINTENANCE: Social History   Tobacco Use   Smoking status: Never   Smokeless tobacco: Never  Vaping Use   Vaping Use: Never used  Substance Use Topics   Alcohol use: Yes    Alcohol/week: 0.0 standard drinks    Comment: 1-2 drinks a month    Drug use: No     Colonoscopy: 05/2018 (Dr. Oletta Lamas), normal, recall 2024 for family history  PAP: 06/2020, negative  Bone density: 03/2015, +0.5   Allergies  Allergen Reactions   Meloxicam Itching and Rash    Current Outpatient Medications  Medication Sig Dispense Refill   acetaminophen (TYLENOL) 650 MG CR tablet Take 1,300 mg by mouth every 8 (eight) hours as needed for pain.     ALPRAZolam (XANAX) 0.5 MG tablet TAKE 1/2 -1 TABLET BY MOUTH 3 TIMES DAILY AS NEEDED FOR ANXIETY 20 tablet 0   amLODipine (NORVASC) 5 MG tablet TAKE 1 AND 1/2 TABLETS(7.5 MG) BY MOUTH DAILY 135 tablet 1   anastrozole (ARIMIDEX) 1 MG tablet Take 1 tablet (1 mg total) by mouth daily. Start December 31, 2020 90 tablet 4   ARNUITY ELLIPTA 100 MCG/ACT AEPB INHALE 1 PUFF INTO THE LUNGS DAILY 30 each 3   atorvastatin (LIPITOR) 80 MG tablet TAKE 1 TABLET(80 MG) BY MOUTH DAILY 90 tablet 1   CALCIUM PO Take 1,000 mg by mouth daily.     cholecalciferol (VITAMIN D3) 25 MCG (1000 UNIT) tablet Take 1,000 Units by mouth daily.     Desvenlafaxine Succinate (PRISTIQ PO) Take 100 mg by mouth daily.     Dextromethorphan-guaiFENesin (MUCINEX DM MAXIMUM STRENGTH) 60-1200 MG TB12 Take 2 tablets by mouth in the morning and at bedtime.     diclofenac (VOLTAREN) 75 MG EC tablet TAKE 1 TABLET(75 MG) BY MOUTH TWICE DAILY WITH FOOD AS NEEDED FOR MILD PAIN OR MODERATE PAIN 60 tablet 2   Ferrous Sulfate (IRON) 325 (65 Fe) MG TABS Take 1 tablet by mouth daily. (Patient not taking: Reported on 01/13/2021)     fluticasone (FLONASE) 50 MCG/ACT nasal spray INSTILL 1 TO 2 SPRAYS INTO EACH NOSTRIL ONCE A DAY 16 g 11   gabapentin (NEURONTIN) 300 MG capsule TAKE 1 CAPSULE(300 MG) BY MOUTH TWICE DAILY 180 capsule 1   hydrochlorothiazide (MICROZIDE) 12.5 MG capsule TAKE 1 CAPSULE(12.5 MG) BY MOUTH DAILY 90 capsule 1   L-Methylfolate-Algae (DEPLIN 15 PO) Take 1 capsule by mouth daily.     loratadine (CLARITIN) 10 MG tablet Take 10 mg by mouth daily.      losartan (COZAAR) 50 MG tablet Take 1 tablet (50 mg total) by mouth daily. 90 tablet 1   Multiple Vitamins-Minerals (ONE-A-DAY MENS 50+ PO) Take 1 tablet by mouth daily.     omega-3 acid ethyl  esters (LOVAZA) 1 g capsule Take 2 capsules (2 g total) by mouth 2 (two) times daily. 120 capsule 3   omeprazole (PRILOSEC) 20 MG capsule Take 20 mg by mouth daily.     ondansetron (ZOFRAN) 4 MG tablet Take 4 mg by mouth every 8 (eight) hours as needed for nausea or vomiting. (Patient not taking: Reported on 01/13/2021)     tiZANidine (ZANAFLEX) 4 MG tablet Take 4 mg by mouth as needed for muscle spasms.      triamcinolone (KENALOG) 0.025 % cream Apply 1 application topically 2 (two) times daily. (Patient not taking: Reported on 01/13/2021) 30 g 0   vitamin B-12 (CYANOCOBALAMIN) 1000 MCG tablet Take 1,000 mcg by mouth daily.     vitamin C (ASCORBIC ACID) 500 MG tablet Take 500 mg by mouth daily.     No current facility-administered medications for this visit.    OBJECTIVE: White woman who appears stated age  There were no vitals filed for this visit.    There is no height or weight on file to calculate BMI.   Wt Readings from Last 3 Encounters:  01/13/21 250 lb (113.4 kg)  01/09/21 260 lb 8 oz (118.2 kg)  11/12/20 260 lb 8 oz (118.2 kg)      ECOG FS:1 - Symptomatic but completely ambulatory  Telemedicine visit 03/05/2021   LAB RESULTS:  CMP     Component Value Date/Time   NA 136 09/11/2020 0830   NA 141 06/20/2020 1002   K 4.0 09/11/2020 0830   CL 101 09/11/2020 0830   CO2 26 09/11/2020 0830   GLUCOSE 122 (H) 09/11/2020 0830   BUN 16 09/11/2020 0830   BUN 16 06/20/2020 1002   CREATININE 1.04 (H) 09/11/2020 0830   CREATININE 0.97 03/09/2017 1110   CALCIUM 9.8 09/11/2020 0830   PROT 8.0 09/11/2020 0830   PROT 7.7 06/20/2020 1002   ALBUMIN 4.3 09/11/2020 0830   ALBUMIN 4.6 06/20/2020 1002   AST 49 (H) 09/11/2020 0830   ALT 58 (H) 09/11/2020 0830   ALKPHOS 98 09/11/2020 0830   BILITOT  0.4 09/11/2020 0830   GFRNONAA >60 09/11/2020 0830   GFRAA 77 06/20/2020 1002    No results found for: TOTALPROTELP, ALBUMINELP, A1GS, A2GS, BETS, BETA2SER, GAMS, MSPIKE, SPEI  Lab Results  Component Value Date   WBC 8.0 09/11/2020   NEUTROABS 4.5 09/11/2020   HGB 12.4 09/11/2020   HCT 37.7 09/11/2020   MCV 90.8 09/11/2020   PLT 262 09/11/2020    No results found for: LABCA2  No components found for: UYEBXI356  No results for input(s): INR in the last 168 hours.  No results found for: LABCA2  No results found for: YSH683  No results found for: FGB021  No results found for: JDB520  No results found for: CA2729  No components found for: HGQUANT  No results found for: CEA1 / No results found for: CEA1   No results found for: AFPTUMOR  No results found for: CHROMOGRNA  No results found for: KPAFRELGTCHN, LAMBDASER, KAPLAMBRATIO (kappa/lambda light chains)  No results found for: HGBA, HGBA2QUANT, HGBFQUANT, HGBSQUAN (Hemoglobinopathy evaluation)   No results found for: LDH  Lab Results  Component Value Date   IRON 60 05/03/2019   TIBC 316 05/03/2019   IRONPCTSAT 19 05/03/2019   (Iron and TIBC)  Lab Results  Component Value Date   FERRITIN 42 05/03/2019    Urinalysis    Component Value Date/Time   BILIRUBINUR neg 03/12/2016 0854   PROTEINUR  neg 03/12/2016 0854   UROBILINOGEN negative 03/12/2016 0854   NITRITE neg 03/12/2016 0854   LEUKOCYTESUR Negative 03/12/2016 0854    STUDIES: No results found.   ELIGIBLE FOR AVAILABLE RESEARCH PROTOCOL:no  ASSESSMENT: 55 y.o. Susan Gould woman status post right breast upper outer quadrant biopsy 09/02/2020 for a clinical T1b N0, stage IA invasive ductal carcinoma, grade 2, estrogen and progesterone receptor positive, HER-2 not amplified, with an MIB-1 of 30%  (1) status post right lumpectomy and sentinel lymph node sampling 10/03/2020 for a pT1c pN0, stage IA invasive ductal carcinoma, grade 2, with  negative margins  (a) a single right axillary lymph node was removed  (2) Oncotype score of 25 predicted a risk of recurrence outside the breast in the next 9 years of 12% if the patient's only systemic therapy is antiestrogens for 5 years.  It predicts a less than 1% chemotherapy benefit  (3) adjuvant radiation to be completed 12/06/2020  (4) to start anastrozole 12/31/2020  (a) DEXA scan 04/22/2015  (b) DEXA scan September or October 2022, with new baseline mammogram   PLAN: Susan Gould is tolerating the anastrozole generally quite well, and we discussed various strategies for hot flashes.  I think she would be a good candidate for the TTS 1 patch but she tells me she is on so many medications right now she really would prefer not to add another 1 so she "can live with it" at least for now.  We are setting her up for mammography and a bone density scan later this year and she will see me shortly after those exams.   Virgie Dad. Margan Elias, MD 03/05/2021 5:46 PM Medical Oncology and Hematology [facility] [place], [place] [address] Tel. [phone]    Fax. [phone]   This document serves as a record of services personally performed by Lurline Del, MD. It was created on his behalf by Wilburn Mylar, a trained medical scribe. The creation of this record is based on the scribe's personal observations and the provider's statements to them.   I, Lurline Del MD, have reviewed the above documentation for accuracy and completeness, and I agree with the above.   *Total Encounter Time as defined by the Centers for Medicare and Medicaid Services includes, in addition to the face-to-face time of a patient visit (documented in the note above) non-face-to-face time: obtaining and reviewing outside history, ordering and reviewing medications, tests or procedures, care coordination (communications with other health care professionals or caregivers) and documentation in  the medical record.

## 2021-03-10 DIAGNOSIS — F3289 Other specified depressive episodes: Secondary | ICD-10-CM | POA: Diagnosis not present

## 2021-03-10 DIAGNOSIS — F419 Anxiety disorder, unspecified: Secondary | ICD-10-CM | POA: Diagnosis not present

## 2021-03-12 DIAGNOSIS — M25561 Pain in right knee: Secondary | ICD-10-CM | POA: Diagnosis not present

## 2021-03-17 DIAGNOSIS — F419 Anxiety disorder, unspecified: Secondary | ICD-10-CM | POA: Diagnosis not present

## 2021-03-17 DIAGNOSIS — F3289 Other specified depressive episodes: Secondary | ICD-10-CM | POA: Diagnosis not present

## 2021-03-21 DIAGNOSIS — L813 Cafe au lait spots: Secondary | ICD-10-CM | POA: Diagnosis not present

## 2021-03-21 DIAGNOSIS — L821 Other seborrheic keratosis: Secondary | ICD-10-CM | POA: Diagnosis not present

## 2021-03-21 DIAGNOSIS — L280 Lichen simplex chronicus: Secondary | ICD-10-CM | POA: Diagnosis not present

## 2021-03-21 DIAGNOSIS — L218 Other seborrheic dermatitis: Secondary | ICD-10-CM | POA: Diagnosis not present

## 2021-03-24 DIAGNOSIS — F3289 Other specified depressive episodes: Secondary | ICD-10-CM | POA: Diagnosis not present

## 2021-03-24 DIAGNOSIS — F419 Anxiety disorder, unspecified: Secondary | ICD-10-CM | POA: Diagnosis not present

## 2021-03-25 DIAGNOSIS — M25561 Pain in right knee: Secondary | ICD-10-CM | POA: Diagnosis not present

## 2021-03-26 DIAGNOSIS — E119 Type 2 diabetes mellitus without complications: Secondary | ICD-10-CM | POA: Diagnosis not present

## 2021-03-26 DIAGNOSIS — H524 Presbyopia: Secondary | ICD-10-CM | POA: Diagnosis not present

## 2021-03-26 DIAGNOSIS — H35373 Puckering of macula, bilateral: Secondary | ICD-10-CM | POA: Diagnosis not present

## 2021-03-27 LAB — HM DIABETES EYE EXAM

## 2021-03-28 DIAGNOSIS — M25561 Pain in right knee: Secondary | ICD-10-CM | POA: Diagnosis not present

## 2021-03-31 ENCOUNTER — Encounter: Payer: Self-pay | Admitting: *Deleted

## 2021-03-31 DIAGNOSIS — G4733 Obstructive sleep apnea (adult) (pediatric): Secondary | ICD-10-CM | POA: Diagnosis not present

## 2021-03-31 DIAGNOSIS — F3289 Other specified depressive episodes: Secondary | ICD-10-CM | POA: Diagnosis not present

## 2021-03-31 DIAGNOSIS — F419 Anxiety disorder, unspecified: Secondary | ICD-10-CM | POA: Diagnosis not present

## 2021-04-01 ENCOUNTER — Encounter: Payer: Self-pay | Admitting: Family Medicine

## 2021-04-07 ENCOUNTER — Ambulatory Visit: Payer: BC Managed Care – PPO | Attending: Surgery

## 2021-04-07 ENCOUNTER — Other Ambulatory Visit: Payer: Self-pay

## 2021-04-07 VITALS — Wt 250.0 lb

## 2021-04-07 DIAGNOSIS — F3289 Other specified depressive episodes: Secondary | ICD-10-CM | POA: Diagnosis not present

## 2021-04-07 DIAGNOSIS — F419 Anxiety disorder, unspecified: Secondary | ICD-10-CM | POA: Diagnosis not present

## 2021-04-07 DIAGNOSIS — Z483 Aftercare following surgery for neoplasm: Secondary | ICD-10-CM | POA: Insufficient documentation

## 2021-04-07 NOTE — Therapy (Signed)
[facility] @ [place], [place], [address] Phone: [phone]   Fax:  [phone]  Physical Therapy Treatment  Patient Details  Name: Susan Gould MRN: [id number] Date of Birth: May 05, 1966 Referring Provider (PT): Dr. [name]   Encounter Date: 04/07/2021   PT End of Session - 04/07/21 0816     Visit Number 2   # unchanged due to screen only   PT Start Time 0812    PT Stop Time 0817    PT Time Calculation (min) 5 min    Activity Tolerance Patient tolerated treatment well    Behavior During Therapy [facility] for tasks assessed/performed             Past Medical History:  Diagnosis Date   Allergic rhinitis, cause unspecified    cat/dog/ragweed/mold/wool, s/p immunotherapy x 3-4 yrs   Anxiety 2006   Asthma    (Dr. Joya Gaskins)   Breast cancer [facility]) 06/2020   Colon polyps 2007   Dr. Oletta Lamas (she was told repeat in 10 years)   Depression 2006   Endometriosis    Dr. Leo Grosser   Family history of breast cancer in female    GERD (gastroesophageal reflux disease)    Heart murmur    Hemorrhoid    internal and external   History of radiation therapy 12/06/2020   right breast 11/07/2020-12/06/2020  Dr Gery Pray   Hyperlipidemia    Hypertension    Insulin resistance    Dr. Leo Grosser   Medial meniscus tear    Left. Surgery [name].  NOT seen on MRI 06/2013   Obesity    OSA (obstructive sleep apnea)    on CPAP (Dr. Gwenette Greet)    Past Surgical History:  Procedure Laterality Date   BREAST LUMPECTOMY WITH SENTINEL LYMPH NODE BIOPSY Right 10/03/2020   Procedure: RIGHT BREAST LUMPECTOMY WITH SENTINEL LYMPH NODE BIOPSY;  Surgeon: [name], MD;  Location: [facility];  Service: General;  Laterality: Right;   CARPAL TUNNEL RELEASE Right 12/28/2019   Dr. Percell Miller   CARPAL TUNNEL RELEASE Left 02/22/2020   [place] CYST EXCISION  2003    RIGHT 3rd finger   JOINT REPLACEMENT  2017   KNEE SURGERY  '95 and '97   BILATERAL KNEE   LAPAROSCOPY  '98, '01   endometriosis (Dr. Leo Grosser)   LASIK  2000   tibial tuberoplasty  90's   left    Vitals:   04/07/21 0813  Weight: 250 lb (113.4 kg)     Subjective Assessment - 04/07/21 0813     Subjective Pt returns for her 3 month L-Dex screen.    Pertinent History Patient was diagnosed on 09/02/2020 with right grade II invasive ductal carcinoma breast cancer. She underwent a right lumpectomy and sentinel node biopsy (1 negative) on 10/03/2020. It is ER/PR positive and HER2 negative with a Ki67 of 30%.                    L-DEX FLOWSHEETS - 04/07/21 0800       L-DEX LYMPHEDEMA SCREENING   Measurement Type Unilateral    L-DEX MEASUREMENT EXTREMITY Upper Extremity    POSITION  Standing    DOMINANT SIDE Right    At Risk Side Right    BASELINE SCORE (UNILATERAL) -1.2    L-DEX SCORE (UNILATERAL) 2.9    VALUE CHANGE (UNILAT) 4.1  PT Long Term Goals - 10/24/20 0956       PT LONG TERM GOAL #1   Title Patient will demonstrate she has regained full shoulder ROM and function post operatively compared to baselines.    Time 8    Period Weeks    Status Achieved                   Plan - 04/07/21 0817     Clinical Impression Statement Pt returns for her 3 month L-Dex screen. Her change from baseline of 4.1 is WNLs so no further treatment is required at this time except to cont every 3 month L-Dex screens which pt is agreeable to.    PT Next Visit Plan Cont every 3 month L-Dex screens for up to 2 years from her SLNB (~10/04/22)    Consulted and Agree with Plan of Care Patient             Patient will benefit from skilled therapeutic intervention in order to improve the following deficits and impairments:     Visit Diagnosis: Aftercare following surgery for neoplasm     Problem List Patient  Active Problem List   Diagnosis Date Noted   Malignant neoplasm of upper-outer quadrant of right breast in female, estrogen receptor positive ([place]) 09/09/2020   Steatohepatitis 07/25/2019   Acute exacerbation of moderate persistent extrinsic asthma 06/02/2016   Class 3 obesity due to excess calories with serious comorbidity and body mass index (BMI) of 45.0 to 49.9 in adult 04/23/2016   Pre-operative clearance 09/23/2015   Mixed hyperlipidemia 07/29/2011   OA (osteoarthritis) of knee 07/29/2011   Morbid obesity with body mass index of 45.0-49.9 in adult [facility]) 03/17/2010   Insulin resistance syndrome 11/13/2009   HYPERLIPIDEMIA 11/13/2009   Anxiety state 11/13/2009   Essential hypertension 11/13/2009   Moderate persistent asthma in adult without complication 75/30/1040   GERD 11/13/2009   ENDOMETRIOSIS 11/13/2009   OSA (obstructive sleep apnea) 11/13/2009    Otelia Limes, PTA 04/07/2021, 8:19 AM  [facility] @ [place], [place], [address] Phone: [phone]   Fax:  [phone]  Name: TRYNITI LAATSCH MRN: [id number] Date of Birth: July 05, 1965

## 2021-04-14 ENCOUNTER — Ambulatory Visit
Admission: RE | Admit: 2021-04-14 | Discharge: 2021-04-14 | Disposition: A | Discharge status: Home / Self Care | Payer: BC Managed Care – PPO | Source: Ambulatory Visit | Attending: Oncology | Admitting: Oncology

## 2021-04-14 ENCOUNTER — Other Ambulatory Visit: Payer: Self-pay

## 2021-04-14 DIAGNOSIS — R922 Inconclusive mammogram: Secondary | ICD-10-CM | POA: Diagnosis not present

## 2021-04-14 DIAGNOSIS — Z170 Estrogen receptor positive status [ER+]: Secondary | ICD-10-CM

## 2021-04-16 DIAGNOSIS — F3289 Other specified depressive episodes: Secondary | ICD-10-CM | POA: Diagnosis not present

## 2021-04-16 DIAGNOSIS — F419 Anxiety disorder, unspecified: Secondary | ICD-10-CM | POA: Diagnosis not present

## 2021-04-17 DIAGNOSIS — M2241 Chondromalacia patellae, right knee: Secondary | ICD-10-CM | POA: Diagnosis not present

## 2021-04-17 DIAGNOSIS — Y999 Unspecified external cause status: Secondary | ICD-10-CM | POA: Diagnosis not present

## 2021-04-17 DIAGNOSIS — X58XXXA Exposure to other specified factors, initial encounter: Secondary | ICD-10-CM | POA: Diagnosis not present

## 2021-04-17 DIAGNOSIS — M2341 Loose body in knee, right knee: Secondary | ICD-10-CM | POA: Diagnosis not present

## 2021-04-17 DIAGNOSIS — S83231A Complex tear of medial meniscus, current injury, right knee, initial encounter: Secondary | ICD-10-CM | POA: Diagnosis not present

## 2021-04-17 DIAGNOSIS — S83241A Other tear of medial meniscus, current injury, right knee, initial encounter: Secondary | ICD-10-CM | POA: Diagnosis not present

## 2021-04-17 DIAGNOSIS — G8918 Other acute postprocedural pain: Secondary | ICD-10-CM | POA: Diagnosis not present

## 2021-04-17 HISTORY — PX: KNEE ARTHROSCOPY: SHX127

## 2021-04-18 NOTE — Telephone Encounter (Signed)
Pretty sure that I did get a letter about the denial (should be scanned or in "to be scanned" if not yet done).   There really isn't anything we can do. Honestly, triglycerides weren't even at goal when she was taking it, so we will need to see what it is now that she is just on over-the-counter.    She has a visit scheduled for November. She can come to that visit fasting, or if she prefers, we can do labs prior. Let me know and I'll enter orders.  If she wants labs done prior, see if she knows when she is supposed to have her routine labs done again for oncology (c-met and CBC)--I'd be happy to do them for her if due around that time, or if she is having them done prior, I'll make sure not to duplicate.

## 2021-04-18 NOTE — Telephone Encounter (Signed)
Called pt to follow up on Appeal because she had mailed the authorization form directly to [facility] and she just received the denial letter.  She will send me a copy thru mychart.  She has been taking OTC fish oil in the meantime.   Dr. Tomi Bamberger I have exhausted all that I know to do with prior authorizations and appeals for [employer] and Vascepa and insurance will not cover.

## 2021-04-21 ENCOUNTER — Encounter: Payer: Self-pay | Admitting: Family Medicine

## 2021-04-21 NOTE — Telephone Encounter (Signed)
See other notes, appeals were denied

## 2021-04-21 NOTE — Telephone Encounter (Signed)
Denial letter received insurance will only pay with diagnosis of diabetes or triglyceride over 500, called pt and she will just come in fasting to her appt and doesn't know of any labs needed by oncology at this time.

## 2021-04-24 DIAGNOSIS — F419 Anxiety disorder, unspecified: Secondary | ICD-10-CM | POA: Diagnosis not present

## 2021-04-24 DIAGNOSIS — F3289 Other specified depressive episodes: Secondary | ICD-10-CM | POA: Diagnosis not present

## 2021-04-28 DIAGNOSIS — M2341 Loose body in knee, right knee: Secondary | ICD-10-CM | POA: Diagnosis not present

## 2021-04-29 ENCOUNTER — Other Ambulatory Visit: Payer: Self-pay

## 2021-04-29 ENCOUNTER — Encounter: Payer: Self-pay | Admitting: Internal Medicine

## 2021-04-29 ENCOUNTER — Ambulatory Visit (INDEPENDENT_AMBULATORY_CARE_PROVIDER_SITE_OTHER): Payer: BC Managed Care – PPO | Admitting: Internal Medicine

## 2021-04-29 VITALS — BP 120/68 | HR 86 | Temp 98.4°F | Ht 64.0 in | Wt 253.4 lb

## 2021-04-29 DIAGNOSIS — J4540 Moderate persistent asthma, uncomplicated: Secondary | ICD-10-CM | POA: Diagnosis not present

## 2021-04-29 DIAGNOSIS — Z23 Encounter for immunization: Secondary | ICD-10-CM | POA: Diagnosis not present

## 2021-04-29 DIAGNOSIS — Z6841 Body Mass Index (BMI) 40.0 and over, adult: Secondary | ICD-10-CM | POA: Diagnosis not present

## 2021-04-29 DIAGNOSIS — G4733 Obstructive sleep apnea (adult) (pediatric): Secondary | ICD-10-CM | POA: Diagnosis not present

## 2021-04-29 NOTE — Patient Instructions (Signed)
We can continue CPAP auto 5-15  Order- Flu vax standard  Please call if we can help

## 2021-04-29 NOTE — Progress Notes (Signed)
Chief Complaint  Patient presents with   Hypertension    Fasting med check. No concerns. Will most likely come back for covid booster, spoke to pulm yesterday and he said she might not need since she had covid in July and left it up to her.     Patient presents for med check on chronic problems.  Hypertension.  She is currently taking 27m losartan, 12.523mHCTZ and amlodipine 7.40m21mShe is tolerating this without side effects.   She denies headaches, dizziness, chest pain, shortness of breath, muscle cramps (only if not well hydrated), edema. BP's have been fine at other doctor's visits, not checked at home.  BP Readings from Last 3 Encounters:  04/30/21 108/60  04/29/21 120/68  01/13/21 129/74    Mixed hyperlipidemia--Vascepa was no longer covered by her insurance, nor was Lovaza, and all appeals were denied.  She has been taking over-the-counter fish oil since she finished her Vascepa, taking 2000 mg BID. She continues on atorvastatin 49m20mily and denies side effects.    She has been watching her sugar closely (since diagnosed with breast cancer), no candy. Greasy "junk" 2x/week. Eating sushi, not over-eating. Diet could be better, not as bad as in the past.  Lab Results  Component Value Date   CHOL 182 06/20/2020   HDL 44 06/20/2020   LDLCALC 92 06/20/2020   TRIG 278 (H) 06/20/2020   CHOLHDL 4.1 06/20/2020     PCOS, Insulin-resistance, pre-diabetes:  Took metformin for a long time, stopped due to ongoing issues with diarrhea.  A1c increased from 5.8 to 6.1 upon stopping metformin, was 6.0% on last check in April.  She had noted some mild constipation since stopping the metformin, and we had discussed increased water intake, metamucil, high fiber diet.  Bowels are better, as long as she drinks enough water (just slight with recent pain meds from surgery).  She tore R knee meniscus and had surgery 2 weeks ago (Dr. Tim Fredonia HighlandShe still isn't back to 100% activity.  She had  sutures removed earlier this week.  She is gradually advancing activity. Less exercise than usual in the last 6 weeks.  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 (Expecting MRI and other tests).  Chronic back pain: Tizanidine at night helps (pain sometimes interferes with her sleep). Pain is tolerable.  Spreads to buttocks, not into legs.   Depression: She continues to see psychiatrist and counselor. She remains on Pristiq and doing well.  Breast cancer: last visit with oncologist (virtual) was in September. Last mammo was in 03/2021.  She is s/p right lumpectomy and sentinel lymph node biopsy in 09/2020, completed radiation therapy on 12/06/2020 under Dr. KinaSondra Comed started anastrozole on 12/31/2020.  She is tolerating this medication, hot flashes are tolerable. She is scheduled for DEXA in 07/2021.  Under the care of pulmonary for asthma (previously Dr. RamaChase Callersn't seen since [place] only seeing Dr. YounAnnamaria Bootsd OSA (Dr. YounAnnamaria Bootshe is doing well on Arnuity. She saw Dr. YounAnnamaria Bootsterday. Not needing rescue inhaler    PMH, PSH, SH reviewed  Outpatient Encounter Medications as of 04/30/2021  Medication Sig Note   acetaminophen (TYLENOL) 650 MG CR tablet Take 1,300 mg by mouth every 8 (eight) hours as needed for pain.    amLODipine (NORVASC) 5 MG tablet TAKE 1 AND 1/2 TABLETS(7.5 MG) BY MOUTH DAILY    anastrozole (ARIMIDEX) 1 MG tablet Take 1 tablet (  1 mg total) by mouth daily. Start December 31, 2020    ARNUITY ELLIPTA 100 MCG/ACT AEPB INHALE 1 PUFF INTO THE LUNGS DAILY    atorvastatin (LIPITOR) 80 MG tablet TAKE 1 TABLET(80 MG) BY MOUTH DAILY    betamethasone dipropionate 0.05 % lotion SMARTSIG:Sparingly Topical 3 Times a Week    CALCIUM PO Take 1,000 mg by mouth daily.    cholecalciferol (VITAMIN D3) 25 MCG (1000 UNIT) tablet Take 1,000 Units by mouth daily.    Desvenlafaxine Succinate (PRISTIQ  PO) Take 100 mg by mouth daily.    diclofenac (VOLTAREN) 75 MG EC tablet TAKE 1 TABLET(75 MG) BY MOUTH TWICE DAILY WITH FOOD AS NEEDED FOR MILD PAIN OR MODERATE PAIN 04/30/2021: Taking once daily   fluticasone (FLONASE) 50 MCG/ACT nasal spray INSTILL 1 TO 2 SPRAYS INTO EACH NOSTRIL ONCE A DAY    gabapentin (NEURONTIN) 300 MG capsule TAKE 1 CAPSULE(300 MG) BY MOUTH TWICE DAILY    hydrochlorothiazide (MICROZIDE) 12.5 MG capsule TAKE 1 CAPSULE(12.5 MG) BY MOUTH DAILY    L-Methylfolate-Algae (DEPLIN 15 PO) Take 1 capsule by mouth daily.    loratadine (CLARITIN) 10 MG tablet Take 10 mg by mouth daily.    losartan (COZAAR) 50 MG tablet Take 1 tablet (50 mg total) by mouth daily.    Multiple Vitamins-Minerals (ONE-A-DAY MENS 50+ PO) Take 1 tablet by mouth daily.    Omega-3 Fatty Acids (FISH OIL) 1000 MG CAPS Take 2,000 mg by mouth in the morning and at bedtime.    omeprazole (PRILOSEC) 20 MG capsule Take 20 mg by mouth daily.    tiZANidine (ZANAFLEX) 4 MG tablet Take 4 mg by mouth as needed for muscle spasms.  04/30/2021: Takes nightly   vitamin B-12 (CYANOCOBALAMIN) 1000 MCG tablet Take 1,000 mcg by mouth daily.    vitamin C (ASCORBIC ACID) 500 MG tablet Take 500 mg by mouth daily.    ALPRAZolam (XANAX) 0.5 MG tablet TAKE 1/2 -1 TABLET BY MOUTH 3 TIMES DAILY AS NEEDED FOR ANXIETY (Patient not taking: Reported on 04/30/2021)    ondansetron (ZOFRAN) 4 MG tablet Take 4 mg by mouth every 8 (eight) hours as needed for nausea or vomiting. (Patient not taking: Reported on 04/30/2021)    [DISCONTINUED] Dextromethorphan-guaiFENesin (MUCINEX DM MAXIMUM STRENGTH) 60-1200 MG TB12 Take 2 tablets by mouth in the morning and at bedtime. (Patient not taking: Reported on 04/29/2021) 01/13/2021: Last dose 8:30am   [DISCONTINUED] Ferrous Sulfate (IRON) 325 (65 Fe) MG TABS Take 1 tablet by mouth daily. (Patient not taking: No sig reported)    [DISCONTINUED] omega-3 acid ethyl esters (LOVAZA) 1 g capsule Take 2 capsules (2 g  total) by mouth 2 (two) times daily. (Patient not taking: Reported on 04/30/2021)    [DISCONTINUED] triamcinolone (KENALOG) 0.025 % cream Apply 1 application topically 2 (two) times daily. (Patient not taking: Reported on 04/29/2021)    No facility-administered encounter medications on file as of 04/30/2021.   Allergies  Allergen Reactions   Meloxicam Itching and Rash    ROS: no fever, chills, headaches, dizziness, chest pain, shortness of breath, edema, urinary complaints. No bleeding, bruising, rash Back pain, chronic, manageable. Knee pain is improving (R), recent surgery. Hot flashes are tolerable Moods are good Weight is stable.   PHYSICAL EXAM:  BP 108/60   Pulse 84   Ht 5' 4"  (1.626 m)   Wt 250 lb 3.2 oz (113.5 kg)   LMP 02/09/2009 (Exact Date)   BMI 42.95 kg/m   Wt Readings from Last  3 Encounters:  04/30/21 250 lb 3.2 oz (113.5 kg)  04/29/21 253 lb 6.4 oz (114.9 kg)  04/07/21 250 lb (113.4 kg)    Pleasant female in no distress HEENT: conjunctiva and sclera are clear, EOMI.  Wearing mask. Neck: no lymphadenopathy, thyromegaly or mass, no bruit Heart: regular rate and rhythm, no murmur Lungs: clear, no wheezing Back: no CVA tenderness. No spinal tenderness, muscle spasm Abdomen: soft, obese, nontender Extremities: no edema, normal pulses Skin: normal turgor, no visible rash Neuro: alert and oriented, normal gait Psych:  Normal eye contact, speech, hygiene and grooming  Lab Results  Component Value Date   HGBA1C 5.9 (A) 04/30/2021    ASSESSMENT/PLAN:   Essential hypertension - well controlled, continue current regimen. Cont low Na. Encouraged weight loss - Plan: Comprehensive metabolic panel, amLODipine (NORVASC) 5 MG tablet, losartan (COZAAR) 50 MG tablet  Mixed hyperlipidemia - due for recheck; diet could be better. On 4g OTC fish oil, as insurance won't cover rx. Cont lipitor - Plan: Lipid panel, atorvastatin (LIPITOR) 80 MG tablet  Prediabetes - stable  off meds.  Cont to limit sugar/carbs in diet and work on weight loss, exercise daily as per ortho - Plan: HgB A1c, Comprehensive metabolic panel  Malignant neoplasm of upper-outer quadrant of right breast in female, estrogen receptor positive ([place]) - doing well, tolerating current meds - Plan: Comprehensive metabolic panel, CBC with Differential/Platelet  Moderate persistent asthma in adult without complication - controlled on current meds  Depression, recurrent ([place]) - under care of psych and therapist, doing well on pristiq  Medication monitoring encounter - Plan: Comprehensive metabolic panel, Lipid panel, CBC with Differential/Platelet  Vaccine counseling--she had questions re: timing/need for COVID booster (just had COVID in July).  Since it has been 3 months, recommended she get, but needs to separate from her recent flu shot by at least 2 weeks. Can schedule NV at her convenience (vs get from pharmacy). Discussed tetanus--booster is due in January.  She can either schedule NV vs wait and get at her next visit (6 mo f/u)

## 2021-04-29 NOTE — Assessment & Plan Note (Signed)
Continue encouragement to diet/ exercise

## 2021-04-29 NOTE — Assessment & Plan Note (Signed)
Benefits with good compliance and control Plan- continue auto 5-15

## 2021-04-29 NOTE — Assessment & Plan Note (Signed)
Very clear on exam with no concerns expressed at this visit.

## 2021-04-29 NOTE — Progress Notes (Signed)
HPI female never smoker followed for OSA, complicated by obesity, GERD, HBP Followed by Dr. Chase Caller for asthma NPSG 04/23/06- AHI 42/ hr, desat to 89% ---------------------------------------------------------------------------   04/29/20- 55 year old female never smoker followed for OSA, complicated by obesity, GERD, HBP, Asthma, Hyperlipidemia, Depression,  Followed by Dr. Chase Caller for moderate persistent asthma, on Arnuity CPAP auto 5-15/ [employer]- gets supplies on-line Download compliance- 100%, AHI 1.2/ hr Body weight today- 249 lbs Covid vax- 3 Phizer Flu vax- had Asks about pneumonia vaccine- discussed. Does well with CPAP- old machine now. Ready to replace.   04/29/21- 55 year old female never smoker followed for OSA, complicated by Obesity, GERD, HBP, Asthma, Hyperlipidemia, Depression, R Breast CA,  Endometriosis, Covid infection [id number],  Followed by Dr. Chase Caller for moderate persistent asthma, on Arnuity CPAP auto 4-15/ [employer]-AirSense 11 AutoSet   Download compliance- 100%, AHI 0.9/ hr Body weight today- 253 lbs Covid vax- 3 Phizer Flu vax-  -----Doing good with CPAP Tough year- breast cancer, knee arthroscopy, Covid infection. Doing well now.  No concerns with CPAP.  ROS-see HPI   + = positive Constitutional:    weight loss, night sweats, fevers, chills, fatigue, lassitude. HEENT:    headaches, difficulty swallowing, tooth/dental problems, sore throat,       sneezing, itching, ear ache, nasal congestion, post nasal drip, snoring CV:    chest pain, orthopnea, PND, swelling in lower extremities, anasarca,                                                       dizziness, palpitations Resp:   shortness of breath with exertion or at rest.                productive cough,   non-productive cough, coughing up of blood.              change in color of mucus.  wheezing.   Skin:    rash or lesions. GI:  No-   heartburn, indigestion, abdominal pain, nausea, vomiting,  diarrhea,                 change in bowel habits, loss of appetite GU: dysuria, change in color of urine, no urgency or frequency.   flank pain. MS:   joint pain, stiffness, decreased range of motion, back pain. Neuro-     nothing unusual Psych:  change in mood or affect.  depression or anxiety.   memory loss.  OBJ- Physical Exam General- Alert, Oriented, Affect-appropriate, Distress- none acute + obese Skin- rash-none, lesions- none, excoriation- none Lymphadenopathy- none Head- atraumatic            Eyes- Gross vision intact, PERRLA, conjunctivae and secretions clear            Ears- Hearing, canals-normal            Nose- Clear, no-Septal dev, mucus, polyps, erosion, perforation             Throat- Mallampati III-IV , mucosa clear , drainage- none, tonsils- atrophic Neck- flexible , trachea midline, no stridor , thyroid nl, carotid no bruit Chest - symmetrical excursion , unlabored           Heart/CV- RRR , no murmur , no gallop  , no rub, nl s1 s2                           -  JVD- none , edema- none, stasis changes- none, varices- none           Lung- clear to P&A, wheeze- none, cough- none , dullness-none, rub- none           Chest wall-  Abd-  Br/ Gen/ Rectal- Not done, not indicated Extrem- cyanosis- none, clubbing, none, atrophy- none, strength- nl Neuro- grossly intact to observation

## 2021-04-30 ENCOUNTER — Encounter: Payer: Self-pay | Admitting: Family Medicine

## 2021-04-30 ENCOUNTER — Ambulatory Visit (INDEPENDENT_AMBULATORY_CARE_PROVIDER_SITE_OTHER): Payer: BC Managed Care – PPO | Admitting: Family Medicine

## 2021-04-30 VITALS — BP 108/60 | HR 84 | Ht 64.0 in | Wt 250.2 lb

## 2021-04-30 DIAGNOSIS — Z170 Estrogen receptor positive status [ER+]: Secondary | ICD-10-CM | POA: Diagnosis not present

## 2021-04-30 DIAGNOSIS — Z5181 Encounter for therapeutic drug level monitoring: Secondary | ICD-10-CM

## 2021-04-30 DIAGNOSIS — C50411 Malignant neoplasm of upper-outer quadrant of right female breast: Secondary | ICD-10-CM | POA: Diagnosis not present

## 2021-04-30 DIAGNOSIS — E782 Mixed hyperlipidemia: Secondary | ICD-10-CM | POA: Diagnosis not present

## 2021-04-30 DIAGNOSIS — I10 Essential (primary) hypertension: Secondary | ICD-10-CM | POA: Diagnosis not present

## 2021-04-30 DIAGNOSIS — R7303 Prediabetes: Secondary | ICD-10-CM

## 2021-04-30 DIAGNOSIS — F339 Major depressive disorder, recurrent, unspecified: Secondary | ICD-10-CM

## 2021-04-30 DIAGNOSIS — J4540 Moderate persistent asthma, uncomplicated: Secondary | ICD-10-CM

## 2021-04-30 LAB — POCT GLYCOSYLATED HEMOGLOBIN (HGB A1C): Hemoglobin A1C: 5.9 % — AB (ref 4.0–5.6)

## 2021-04-30 MED ORDER — AMLODIPINE BESYLATE 5 MG PO TABS: ORAL_TABLET | ORAL | 1 refills | Status: DC

## 2021-04-30 MED ORDER — LOSARTAN POTASSIUM 50 MG PO TABS
50.0000 mg | ORAL_TABLET | Freq: Every day | ORAL | 1 refills | Status: DC
Start: 2021-04-30 — End: 2021-10-29

## 2021-04-30 MED ORDER — ATORVASTATIN CALCIUM 80 MG PO TABS: ORAL_TABLET | ORAL | 1 refills | Status: DC

## 2021-04-30 NOTE — Patient Instructions (Signed)
We briefly discussed injectable medications (usually for diabetes that can also help with weight loss)--including Ozempic, Trulicity and Mounjaro.

## 2021-05-01 DIAGNOSIS — G4733 Obstructive sleep apnea (adult) (pediatric): Secondary | ICD-10-CM | POA: Diagnosis not present

## 2021-05-01 DIAGNOSIS — F419 Anxiety disorder, unspecified: Secondary | ICD-10-CM | POA: Diagnosis not present

## 2021-05-01 DIAGNOSIS — F3289 Other specified depressive episodes: Secondary | ICD-10-CM | POA: Diagnosis not present

## 2021-05-01 LAB — CBC WITH DIFFERENTIAL/PLATELET
Basophils Absolute: 0.1 10*3/uL (ref 0.0–0.2)
Basos: 1 %
EOS (ABSOLUTE): 0.2 10*3/uL (ref 0.0–0.4)
Eos: 2 %
Hematocrit: 40.2 % (ref 34.0–46.6)
Hemoglobin: 13.7 g/dL (ref 11.1–15.9)
Immature Grans (Abs): 0 10*3/uL (ref 0.0–0.1)
Immature Granulocytes: 0 %
Lymphocytes Absolute: 2.2 10*3/uL (ref 0.7–3.1)
Lymphs: 23 %
MCH: 30.9 pg (ref 26.6–33.0)
MCHC: 34.1 g/dL (ref 31.5–35.7)
MCV: 91 fL (ref 79–97)
Monocytes Absolute: 0.7 10*3/uL (ref 0.1–0.9)
Monocytes: 7 %
Neutrophils Absolute: 6.5 10*3/uL (ref 1.4–7.0)
Neutrophils: 67 %
Platelets: 273 10*3/uL (ref 150–450)
RBC: 4.44 x10E6/uL (ref 3.77–5.28)
RDW: 13.4 % (ref 11.7–15.4)
WBC: 9.7 10*3/uL (ref 3.4–10.8)

## 2021-05-01 LAB — COMPREHENSIVE METABOLIC PANEL
ALT: 47 IU/L — ABNORMAL HIGH (ref 0–32)
AST: 37 IU/L (ref 0–40)
Albumin/Globulin Ratio: 1.6 (ref 1.2–2.2)
Albumin: 4.9 g/dL (ref 3.8–4.9)
Alkaline Phosphatase: 117 IU/L (ref 44–121)
BUN/Creatinine Ratio: 13 (ref 9–23)
BUN: 11 mg/dL (ref 6–24)
Bilirubin Total: 0.4 mg/dL (ref 0.0–1.2)
CO2: 27 mmol/L (ref 20–29)
Calcium: 10.4 mg/dL — ABNORMAL HIGH (ref 8.7–10.2)
Chloride: 99 mmol/L (ref 96–106)
Creatinine, Ser: 0.87 mg/dL (ref 0.57–1.00)
Globulin, Total: 3 g/dL (ref 1.5–4.5)
Glucose: 99 mg/dL (ref 70–99)
Potassium: 4.2 mmol/L (ref 3.5–5.2)
Sodium: 142 mmol/L (ref 134–144)
Total Protein: 7.9 g/dL (ref 6.0–8.5)
eGFR: 78 mL/min/{1.73_m2} (ref >59)

## 2021-05-01 LAB — LIPID PANEL
Chol/HDL Ratio: 4.1 ratio (ref 0.0–4.4)
Cholesterol, Total: 187 mg/dL (ref 100–199)
HDL: 46 mg/dL (ref >39)
LDL Chol Calc (NIH): 101 mg/dL — ABNORMAL HIGH (ref 0–99)
Triglycerides: 236 mg/dL — ABNORMAL HIGH (ref 0–149)
VLDL Cholesterol Cal: 40 mg/dL (ref 5–40)

## 2021-05-02 ENCOUNTER — Encounter: Payer: Self-pay | Admitting: Family Medicine

## 2021-05-05 ENCOUNTER — Other Ambulatory Visit: Payer: Self-pay | Admitting: Oncology

## 2021-05-05 DIAGNOSIS — F3289 Other specified depressive episodes: Secondary | ICD-10-CM | POA: Diagnosis not present

## 2021-05-05 DIAGNOSIS — F419 Anxiety disorder, unspecified: Secondary | ICD-10-CM | POA: Diagnosis not present

## 2021-05-07 ENCOUNTER — Telehealth: Payer: Self-pay | Admitting: Oncology

## 2021-05-07 ENCOUNTER — Encounter: Payer: Self-pay | Admitting: Family Medicine

## 2021-05-07 NOTE — Telephone Encounter (Signed)
Scheduled per sch msg. Called and spoke with patient. Confirmed appt  

## 2021-05-12 DIAGNOSIS — F419 Anxiety disorder, unspecified: Secondary | ICD-10-CM | POA: Diagnosis not present

## 2021-05-12 DIAGNOSIS — F3289 Other specified depressive episodes: Secondary | ICD-10-CM | POA: Diagnosis not present

## 2021-05-19 DIAGNOSIS — F419 Anxiety disorder, unspecified: Secondary | ICD-10-CM | POA: Diagnosis not present

## 2021-05-19 DIAGNOSIS — F3289 Other specified depressive episodes: Secondary | ICD-10-CM | POA: Diagnosis not present

## 2021-05-26 DIAGNOSIS — F3289 Other specified depressive episodes: Secondary | ICD-10-CM | POA: Diagnosis not present

## 2021-05-26 DIAGNOSIS — F419 Anxiety disorder, unspecified: Secondary | ICD-10-CM | POA: Diagnosis not present

## 2021-05-30 DIAGNOSIS — K7581 Nonalcoholic steatohepatitis (NASH): Secondary | ICD-10-CM | POA: Diagnosis not present

## 2021-05-30 DIAGNOSIS — R197 Diarrhea, unspecified: Secondary | ICD-10-CM | POA: Diagnosis not present

## 2021-05-30 DIAGNOSIS — Z8371 Family history of colonic polyps: Secondary | ICD-10-CM | POA: Diagnosis not present

## 2021-05-31 DIAGNOSIS — G4733 Obstructive sleep apnea (adult) (pediatric): Secondary | ICD-10-CM | POA: Diagnosis not present

## 2021-06-02 DIAGNOSIS — F3289 Other specified depressive episodes: Secondary | ICD-10-CM | POA: Diagnosis not present

## 2021-06-02 DIAGNOSIS — F419 Anxiety disorder, unspecified: Secondary | ICD-10-CM | POA: Diagnosis not present

## 2021-06-04 DIAGNOSIS — M6281 Muscle weakness (generalized): Secondary | ICD-10-CM | POA: Diagnosis not present

## 2021-06-04 DIAGNOSIS — S83241D Other tear of medial meniscus, current injury, right knee, subsequent encounter: Secondary | ICD-10-CM | POA: Diagnosis not present

## 2021-06-04 DIAGNOSIS — M25561 Pain in right knee: Secondary | ICD-10-CM | POA: Diagnosis not present

## 2021-06-09 DIAGNOSIS — F3289 Other specified depressive episodes: Secondary | ICD-10-CM | POA: Diagnosis not present

## 2021-06-09 DIAGNOSIS — F419 Anxiety disorder, unspecified: Secondary | ICD-10-CM | POA: Diagnosis not present

## 2021-06-10 ENCOUNTER — Inpatient Hospital Stay: Payer: BC Managed Care – PPO | Attending: Adult Health | Admitting: Adult Health

## 2021-06-10 ENCOUNTER — Encounter: Payer: Self-pay | Admitting: Adult Health

## 2021-06-10 ENCOUNTER — Other Ambulatory Visit: Payer: Self-pay

## 2021-06-10 VITALS — BP 138/74 | HR 102 | Temp 98.9°F | Resp 18 | Ht 64.0 in | Wt 262.0 lb

## 2021-06-10 DIAGNOSIS — C50411 Malignant neoplasm of upper-outer quadrant of right female breast: Secondary | ICD-10-CM | POA: Diagnosis not present

## 2021-06-10 DIAGNOSIS — Z170 Estrogen receptor positive status [ER+]: Secondary | ICD-10-CM | POA: Insufficient documentation

## 2021-06-10 DIAGNOSIS — Z923 Personal history of irradiation: Secondary | ICD-10-CM | POA: Insufficient documentation

## 2021-06-10 NOTE — Progress Notes (Signed)
[place]  [phone] [phone]     ID: Susan Gould DOB: [birth year]  MR#: [id number]  DZH#:[id number]  Patient Care Team: Rita Ohara, MD as PCP - General (Family Medicine) [name], MD as Consulting Physician (General Surgery) Magrinat, Virgie Dad, MD as Consulting Physician (Oncology) Gery Pray, MD as Consulting Physician (Radiation Oncology) Laroy Apple, MD as Consulting Physician (Physical Medicine and Rehabilitation) Everett Graff, MD as Consulting Physician (Obstetrics and Gynecology) Arta Silence, MD as Consulting Physician (Gastroenterology) Deneise Lever, MD as Consulting Physician (Pulmonary Disease) Danice Goltz, MD as Consulting Physician (Ophthalmology) Scot Dock, NP OTHER MD:   CHIEF COMPLAINT: estrogen receptor positive breast cancer  CURRENT TREATMENT: anastrozole   INTERVAL HISTORY: Susan Gould was contacted today for follow up of her estrogen receptor positive breast cancer.   She continues on anastrozole daily.  She underwent bilateral breast mammogram on April 14, 2021 which showed no evidence of new or recurrent breast carcinoma and subtle benign post lumpectomy changes on the right.  Breast density was category B.  And a diagnostic mammogram repeat was recommended in 1 year.    Susan Gould was exercising and walking and then she unfortunately tore her meniscus.  She has undergone surgery followed by physical therapy.  She notes that today when walking in from the parking lot she did so relatively easily.  This has encouraged her to look at getting back into a walking regimen slowly.  REVIEW OF SYSTEMS: Review of Systems  Constitutional:  Negative for appetite change, chills, fatigue, fever and unexpected weight change.  HENT:   Negative for hearing loss, lump/mass and trouble swallowing.   Eyes:  Negative for eye problems and icterus.  Respiratory:  Negative for chest tightness,  cough and shortness of breath.   Cardiovascular:  Negative for chest pain, leg swelling and palpitations.  Gastrointestinal:  Negative for abdominal distention, abdominal pain, constipation, diarrhea, nausea and vomiting.  Endocrine: Positive for hot flashes (occasional).  Genitourinary:  Negative for difficulty urinating.   Musculoskeletal:  Negative for arthralgias.  Skin:  Negative for itching and rash.  Neurological:  Negative for dizziness, extremity weakness, headaches and numbness.  Hematological:  Negative for adenopathy. Does not bruise/bleed easily.  Psychiatric/Behavioral:  Negative for depression. The patient is not nervous/anxious.     COVID 19 VACCINATION STATUS: Pfizer x4, most recently 09/2020; infection 12/2020   HISTORY OF CURRENT ILLNESS: From the original intake note:  Susan Gould herself palpated a right breast abnormality. Of note, screening mammogram on 05/31/2020 was negative. She underwent right diagnostic mammography with tomography and right breast ultrasonography at [facility] on 08/29/2020 showing: breast density category B; palpable indeterminate 0.6 cm mass in right breast at 12 o'clock retroareolar region; right axilla negative for adenopathy.  Accordingly on 09/02/2020 she proceeded to biopsy of the right breast area in question. The pathology from this procedure ([id number]) showed: invasive ductal carcinoma, grade 2; ductal papilloma. Prognostic indicators significant for: estrogen receptor, >95% positive with strong staining intensity and progesterone receptor, 15% positive with moderate staining intensity. Proliferation marker Ki67 at 30%. HER2 equivocal by immunohistochemistry (2+), but negative by fluorescent in situ hybridization with a signals ratio 0.92 and number per cell 1.7.   Cancer Staging  Malignant neoplasm of upper-outer quadrant of right breast in female, estrogen receptor positive ([place]) Staging form: Breast, AJCC 8th Edition -  Clinical stage from 09/11/2020: Stage IA (cT1b, cN0, cM0, G2, ER+, PR+, HER2-) - Signed by Lurline Del  C, MD on 09/11/2020 Stage prefix: Initial diagnosis Histologic grading system: 3 grade system Laterality: Right Staged by: Pathologist and managing physician Stage used in treatment planning: Yes National guidelines used in treatment planning: Yes Type of national guideline used in treatment planning: NCCN  The patient's subsequent history is as detailed below.   PAST MEDICAL HISTORY: Past Medical History:  Diagnosis Date   Allergic rhinitis, cause unspecified    cat/dog/ragweed/mold/wool, s/p immunotherapy x 3-4 yrs   Anxiety 2006   Asthma    (Dr. Joya Gaskins)   Breast cancer [facility]) 06/2020   Colon polyps 2007   Dr. Oletta Lamas (she was told repeat in 10 years)   COVID-19 virus infection 12/2020   Depression 2006   Endometriosis    Dr. Leo Grosser   Family history of breast cancer in female    GERD (gastroesophageal reflux disease)    Heart murmur    Hemorrhoid    internal and external   History of radiation therapy 12/06/2020   right breast 11/07/2020-12/06/2020  Dr Gery Pray   Hyperlipidemia    Hypertension    Insulin resistance    Dr. Leo Grosser   Medial meniscus tear    Left. Surgery [name].  NOT seen on MRI 06/2013   Obesity    OSA (obstructive sleep apnea)    on CPAP (Dr. Gwenette Greet)    PAST SURGICAL HISTORY: Past Surgical History:  Procedure Laterality Date   BREAST LUMPECTOMY WITH SENTINEL LYMPH NODE BIOPSY Right 10/03/2020   Procedure: RIGHT BREAST LUMPECTOMY WITH SENTINEL LYMPH NODE BIOPSY;  Surgeon: [name], MD;  Location: [place];  Service: General;  Laterality: Right;   CARPAL TUNNEL RELEASE Right 12/28/2019   Dr. Percell Miller   CARPAL TUNNEL RELEASE Left 02/22/2020   [place] CYST EXCISION  2003   RIGHT 3rd finger   JOINT REPLACEMENT  2017   KNEE ARTHROSCOPY Right 04/17/2021   Dr. Fredonia Highland (torn meniscus)   KNEE SURGERY  '95 and '97   BILATERAL KNEE   LAPAROSCOPY  '98, '01   endometriosis (Dr. Leo Grosser)   LASIK  2000   tibial tuberoplasty  90's   left    FAMILY HISTORY: Family History  Problem Relation Age of Onset   Diabetes Mother    Hypertension Mother    Hyperlipidemia Mother    Arthritis Mother    Cancer Mother 88       breast cancer   Breast cancer Mother 30   Diabetes Father    Hyperlipidemia Father    Hypertension Father    Asthma Father    Arthritis Father    Heart disease Father        CHF, hypokinesis   COPD Father        due to asbestos exposure; former smoker   Pulmonary embolism Father    Parkinson's disease Father    Hypertension Sister    Colon cancer Maternal Grandmother        late 60's   Cancer Maternal Grandmother        colon   Heart disease Maternal Grandmother        MI in 8's   Breast cancer Paternal Aunt        70's   Cancer Paternal Aunt        breast   Heart disease Paternal Uncle    Diabetes Paternal Uncle    Kidney disease Paternal Uncle   Her father  died at age 77 from COPD and Parkinson's. Her mother died at age 70 from a perforated colon. Susan Gould has one brother and three sisters, all living as of 08/2020. She reports breast cancer in her mother at age 6 and in a paternal aunt. She also notes colon cancer in her maternal grandmother in her early 13's.    GYNECOLOGIC HISTORY:  Patient's last menstrual period was 02/09/2009 (exact date). Menarche: 55 years old [place] P 0 LMP 01/2009 Contraceptive: used "for years" and stopped in the early 2000's. HRT never used  Hysterectomy? no BSO? 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 She is single. She lives by herself, with her cat.  She is not a Ambulance person.    ADVANCED DIRECTIVES: not in place; believes she has named her brother Nykayla Marcelli, who lives in [place]  and can be reached at [phone]. Her sister [name] seconds.   HEALTH MAINTENANCE: Social History   Tobacco Use   Smoking status: Never   Smokeless tobacco: Never  Vaping Use   Vaping Use: Never used  Substance Use Topics   Alcohol use: Yes    Alcohol/week: 0.0 standard drinks    Comment: 1-2 drinks a month   Drug use: No     Colonoscopy: 05/2018 (Dr. Oletta Lamas), normal, recall 2024 for family history  PAP: 06/2020, negative  Bone density: 03/2015, +0.5   Allergies  Allergen Reactions   Meloxicam Itching and Rash    Current Outpatient Medications  Medication Sig Dispense Refill   acetaminophen (TYLENOL) 650 MG CR tablet Take 1,300 mg by mouth every 8 (eight) hours as needed for pain.     ALPRAZolam (XANAX) 0.5 MG tablet TAKE 1/2 -1 TABLET BY MOUTH 3 TIMES DAILY AS NEEDED FOR ANXIETY (Patient not taking: Reported on 04/30/2021) 20 tablet 0   amLODipine (NORVASC) 5 MG tablet TAKE 1 AND 1/2 TABLETS(7.5 MG) BY MOUTH DAILY 135 tablet 1   anastrozole (ARIMIDEX) 1 MG tablet Take 1 tablet (1 mg total) by mouth daily. Start December 31, 2020 90 tablet 4   ARNUITY ELLIPTA 100 MCG/ACT AEPB INHALE 1 PUFF INTO THE LUNGS DAILY 30 each 3   atorvastatin (LIPITOR) 80 MG tablet TAKE 1 TABLET(80 MG) BY MOUTH DAILY 90 tablet 1   betamethasone dipropionate 0.05 % lotion SMARTSIG:Sparingly Topical 3 Times a Week     CALCIUM PO Take 1,000 mg by mouth daily.     cholecalciferol (VITAMIN D3) 25 MCG (1000 UNIT) tablet Take 1,000 Units by mouth daily.     Desvenlafaxine Succinate (PRISTIQ PO) Take 100 mg by mouth daily.     diclofenac (VOLTAREN) 75 MG EC tablet TAKE 1 TABLET(75 MG) BY MOUTH TWICE DAILY WITH FOOD AS NEEDED FOR MILD PAIN OR MODERATE PAIN 60 tablet 2   fluticasone (FLONASE) 50 MCG/ACT nasal spray INSTILL 1 TO 2 SPRAYS INTO EACH NOSTRIL ONCE A DAY 16 g 11   gabapentin (NEURONTIN) 300 MG capsule TAKE 1 CAPSULE(300 MG) BY MOUTH TWICE DAILY 180 capsule 1   hydrochlorothiazide (MICROZIDE) 12.5 MG  capsule TAKE 1 CAPSULE(12.5 MG) BY MOUTH DAILY 90 capsule 1   L-Methylfolate-Algae (DEPLIN 15 PO) Take 1 capsule by mouth daily.     loratadine (CLARITIN) 10 MG tablet Take 10 mg by mouth daily.     losartan (COZAAR) 50 MG tablet Take 1 tablet (50 mg total) by mouth daily. 90 tablet 1   Multiple Vitamins-Minerals (ONE-A-DAY  MENS 50+ PO) Take 1 tablet by mouth daily.     Omega-3 Fatty Acids (FISH OIL) 1000 MG CAPS Take 2,000 mg by mouth in the morning and at bedtime.     omeprazole (PRILOSEC) 20 MG capsule Take 20 mg by mouth daily.     ondansetron (ZOFRAN) 4 MG tablet Take 4 mg by mouth every 8 (eight) hours as needed for nausea or vomiting. (Patient not taking: Reported on 04/30/2021)     tiZANidine (ZANAFLEX) 4 MG tablet Take 4 mg by mouth as needed for muscle spasms.      vitamin B-12 (CYANOCOBALAMIN) 1000 MCG tablet Take 1,000 mcg by mouth daily.     vitamin C (ASCORBIC ACID) 500 MG tablet Take 500 mg by mouth daily.     No current facility-administered medications for this visit.    OBJECTIVE: White woman who appears stated age  12:   06/10/21 1015  BP: 138/74  Pulse: (!) 102  Resp: 18  Temp: 98.9 F (37.2 C)  SpO2: 97%      Body mass index is 44.97 kg/m.   Wt Readings from Last 3 Encounters:  06/10/21 262 lb (118.8 kg)  04/30/21 250 lb 3.2 oz (113.5 kg)  04/29/21 253 lb 6.4 oz (114.9 kg)      ECOG FS:1 - Symptomatic but completely ambulatory GENERAL: Patient is a well appearing female in no acute distress HEENT:  Sclerae anicteric.  Oropharynx clear and moist. No ulcerations or evidence of oropharyngeal candidiasis. Neck is supple.  NODES:  No cervical, supraclavicular, or axillary lymphadenopathy palpated.  BREAST EXAM: Right breast status postlumpectomy and radiation no sign of local recurrence left breast is benign. LUNGS:  Clear to auscultation bilaterally.  No wheezes or rhonchi. HEART:  Regular rate and rhythm. No murmur appreciated. ABDOMEN:  Soft, nontender.   Positive, normoactive bowel sounds. No organomegaly palpated. MSK:  No focal spinal tenderness to palpation. Full range of motion bilaterally in the upper extremities. EXTREMITIES:  No peripheral edema.   SKIN:  Clear with no obvious rashes or skin changes. No nail dyscrasia. NEURO:  Nonfocal. Well oriented.  Appropriate affect.    LAB RESULTS:  CMP     Component Value Date/Time   NA 142 04/30/2021 1154   K 4.2 04/30/2021 1154   CL 99 04/30/2021 1154   CO2 27 04/30/2021 1154   GLUCOSE 99 04/30/2021 1154   GLUCOSE 122 (H) 09/11/2020 0830   BUN 11 04/30/2021 1154   CREATININE 0.87 04/30/2021 1154   CREATININE 1.04 (H) 09/11/2020 0830   CREATININE 0.97 03/09/2017 1110   CALCIUM 10.4 (H) 04/30/2021 1154   PROT 7.9 04/30/2021 1154   ALBUMIN 4.9 04/30/2021 1154   AST 37 04/30/2021 1154   AST 49 (H) 09/11/2020 0830   ALT 47 (H) 04/30/2021 1154   ALT 58 (H) 09/11/2020 0830   ALKPHOS 117 04/30/2021 1154   BILITOT 0.4 04/30/2021 1154   BILITOT 0.4 09/11/2020 0830   GFRNONAA >60 09/11/2020 0830   GFRAA 77 06/20/2020 1002    No results found for: TOTALPROTELP, ALBUMINELP, A1GS, A2GS, BETS, BETA2SER, GAMS, MSPIKE, SPEI  Lab Results  Component Value Date   WBC 9.7 04/30/2021   NEUTROABS 6.5 04/30/2021   HGB 13.7 04/30/2021   HCT 40.2 04/30/2021   MCV 91 04/30/2021   PLT 273 04/30/2021    No results found for: LABCA2  No components found for: VXBLTJ030  No results for input(s): INR in the last 168 hours.  No results found for:  LABCA2  No results found for: NGE952  No results found for: WUX324  No results found for: MWN027  No results found for: CA2729  No components found for: HGQUANT  No results found for: CEA1 / No results found for: CEA1   No results found for: AFPTUMOR  No results found for: CHROMOGRNA  No results found for: KPAFRELGTCHN, LAMBDASER, KAPLAMBRATIO (kappa/lambda light chains)  No results found for: HGBA, HGBA2QUANT, HGBFQUANT,  HGBSQUAN (Hemoglobinopathy evaluation)   No results found for: LDH  Lab Results  Component Value Date   IRON 60 05/03/2019   TIBC 316 05/03/2019   IRONPCTSAT 19 05/03/2019   (Iron and TIBC)  Lab Results  Component Value Date   FERRITIN 42 05/03/2019    Urinalysis    Component Value Date/Time   BILIRUBINUR neg 03/12/2016 0854   PROTEINUR neg 03/12/2016 0854   UROBILINOGEN negative 03/12/2016 0854   NITRITE neg 03/12/2016 0854   LEUKOCYTESUR Negative 03/12/2016 0854    STUDIES: No results found.   ELIGIBLE FOR AVAILABLE RESEARCH PROTOCOL:no  ASSESSMENT: 55 y.o. [place] woman status post right breast upper outer quadrant biopsy 09/02/2020 for a clinical T1b N0, stage IA invasive ductal carcinoma, grade 2, estrogen and progesterone receptor positive, HER-2 not amplified, with an MIB-1 of 30%  (1) status post right lumpectomy and sentinel lymph node sampling 10/03/2020 for a pT1c pN0, stage IA invasive ductal carcinoma, grade 2, with negative margins  (a) a single right axillary lymph node was removed  (2) Oncotype score of 25 predicted a risk of recurrence outside the breast in the next 9 years of 12% if the patient's only systemic therapy is antiestrogens for 5 years.  It predicts a less than 1% chemotherapy benefit  (3) adjuvant radiation to be completed 12/06/2020  (4) to start anastrozole 12/31/2020  (a) DEXA scan 04/22/2015  (b) DEXA scan September or October 2022, with new baseline mammogram   PLAN: Susan Gould is here today for follow-up of her estrogen positive breast cancer.  She has no clinical or radiographic sign of breast cancer recurrence.  She continues on anastrozole daily with manageable hot flashes.  She will continue this.  She is due for her bone density test in February, 2022.  She will proceed with this and we will call her with the results.  We reviewed that this test is to establish and evaluate fracture risk, and there are many options for reducing  fracture risk once we establish what her risk is.    She unfortunately had a right knee meniscus tear which has impacted her ability to maintain physical activity.  She has persisted through physical therapy and is motivated to get back to her regular walking schedule.  She will continue to see primary care regularly and we discussed healthy diet and exercise today.  She will follow-up with her new oncologist in 6 months and knows to call for any questions or concerns that may arise between now and then.  Total encounter time: 25 minutes in face-to-face visit time, chart review, lab review, care coordination, and documentation of the encounter.   Wilber Bihari, NP 06/10/21 10:32 AM Medical Oncology and Hematology [facility] [place], [place] [address] Tel. [phone]    Fax. [phone]   *Total Encounter Time as defined by the Centers for Medicare and Medicaid Services includes, in addition to the face-to-face time of a patient visit (documented in the note above) non-face-to-face time: obtaining and reviewing outside history, ordering and reviewing medications,  tests or procedures, care coordination (communications with other health care professionals or caregivers) and documentation in the medical record.

## 2021-06-12 DIAGNOSIS — S83241D Other tear of medial meniscus, current injury, right knee, subsequent encounter: Secondary | ICD-10-CM | POA: Diagnosis not present

## 2021-06-12 DIAGNOSIS — M6281 Muscle weakness (generalized): Secondary | ICD-10-CM | POA: Diagnosis not present

## 2021-06-12 DIAGNOSIS — M25561 Pain in right knee: Secondary | ICD-10-CM | POA: Diagnosis not present

## 2021-06-13 ENCOUNTER — Other Ambulatory Visit: Payer: Self-pay | Admitting: Internal Medicine

## 2021-06-13 ENCOUNTER — Other Ambulatory Visit: Payer: Self-pay | Admitting: Family Medicine

## 2021-06-13 DIAGNOSIS — G8929 Other chronic pain: Secondary | ICD-10-CM

## 2021-06-16 DIAGNOSIS — F419 Anxiety disorder, unspecified: Secondary | ICD-10-CM | POA: Diagnosis not present

## 2021-06-16 DIAGNOSIS — F3289 Other specified depressive episodes: Secondary | ICD-10-CM | POA: Diagnosis not present

## 2021-06-16 NOTE — Telephone Encounter (Signed)
Is this okay to refill? 

## 2021-06-18 DIAGNOSIS — S83241D Other tear of medial meniscus, current injury, right knee, subsequent encounter: Secondary | ICD-10-CM | POA: Diagnosis not present

## 2021-06-18 DIAGNOSIS — M25561 Pain in right knee: Secondary | ICD-10-CM | POA: Diagnosis not present

## 2021-06-18 DIAGNOSIS — M6281 Muscle weakness (generalized): Secondary | ICD-10-CM | POA: Diagnosis not present

## 2021-06-23 ENCOUNTER — Other Ambulatory Visit: Payer: Self-pay | Admitting: Family Medicine

## 2021-06-23 DIAGNOSIS — I10 Essential (primary) hypertension: Secondary | ICD-10-CM

## 2021-06-24 DIAGNOSIS — L918 Other hypertrophic disorders of the skin: Secondary | ICD-10-CM | POA: Diagnosis not present

## 2021-06-24 DIAGNOSIS — L218 Other seborrheic dermatitis: Secondary | ICD-10-CM | POA: Diagnosis not present

## 2021-06-24 DIAGNOSIS — L649 Androgenic alopecia, unspecified: Secondary | ICD-10-CM | POA: Diagnosis not present

## 2021-06-24 DIAGNOSIS — C50011 Malignant neoplasm of nipple and areola, right female breast: Secondary | ICD-10-CM | POA: Diagnosis not present

## 2021-06-27 DIAGNOSIS — S83241D Other tear of medial meniscus, current injury, right knee, subsequent encounter: Secondary | ICD-10-CM | POA: Diagnosis not present

## 2021-06-27 DIAGNOSIS — M25561 Pain in right knee: Secondary | ICD-10-CM | POA: Diagnosis not present

## 2021-06-27 DIAGNOSIS — M6281 Muscle weakness (generalized): Secondary | ICD-10-CM | POA: Diagnosis not present

## 2021-07-03 DIAGNOSIS — S83241D Other tear of medial meniscus, current injury, right knee, subsequent encounter: Secondary | ICD-10-CM | POA: Diagnosis not present

## 2021-07-03 DIAGNOSIS — M25561 Pain in right knee: Secondary | ICD-10-CM | POA: Diagnosis not present

## 2021-07-03 DIAGNOSIS — M6281 Muscle weakness (generalized): Secondary | ICD-10-CM | POA: Diagnosis not present

## 2021-07-07 DIAGNOSIS — F419 Anxiety disorder, unspecified: Secondary | ICD-10-CM | POA: Diagnosis not present

## 2021-07-07 DIAGNOSIS — F3289 Other specified depressive episodes: Secondary | ICD-10-CM | POA: Diagnosis not present

## 2021-07-09 DIAGNOSIS — M25561 Pain in right knee: Secondary | ICD-10-CM | POA: Diagnosis not present

## 2021-07-10 DIAGNOSIS — S83241D Other tear of medial meniscus, current injury, right knee, subsequent encounter: Secondary | ICD-10-CM | POA: Diagnosis not present

## 2021-07-10 DIAGNOSIS — M25561 Pain in right knee: Secondary | ICD-10-CM | POA: Diagnosis not present

## 2021-07-10 DIAGNOSIS — M6281 Muscle weakness (generalized): Secondary | ICD-10-CM | POA: Diagnosis not present

## 2021-07-11 DIAGNOSIS — G4700 Insomnia, unspecified: Secondary | ICD-10-CM | POA: Diagnosis not present

## 2021-07-11 DIAGNOSIS — F411 Generalized anxiety disorder: Secondary | ICD-10-CM | POA: Diagnosis not present

## 2021-07-11 DIAGNOSIS — F3341 Major depressive disorder, recurrent, in partial remission: Secondary | ICD-10-CM | POA: Diagnosis not present

## 2021-07-14 DIAGNOSIS — F419 Anxiety disorder, unspecified: Secondary | ICD-10-CM | POA: Diagnosis not present

## 2021-07-14 DIAGNOSIS — F3289 Other specified depressive episodes: Secondary | ICD-10-CM | POA: Diagnosis not present

## 2021-07-17 ENCOUNTER — Other Ambulatory Visit: Payer: Self-pay | Admitting: Hematology and Oncology

## 2021-07-17 ENCOUNTER — Ambulatory Visit
Admission: RE | Admit: 2021-07-17 | Discharge: 2021-07-17 | Disposition: A | Discharge status: Home / Self Care | Payer: BC Managed Care – PPO | Source: Ambulatory Visit | Attending: Oncology | Admitting: Oncology

## 2021-07-17 DIAGNOSIS — Z170 Estrogen receptor positive status [ER+]: Secondary | ICD-10-CM

## 2021-07-17 DIAGNOSIS — C50411 Malignant neoplasm of upper-outer quadrant of right female breast: Secondary | ICD-10-CM

## 2021-07-17 DIAGNOSIS — Z780 Asymptomatic menopausal state: Secondary | ICD-10-CM | POA: Diagnosis not present

## 2021-07-21 ENCOUNTER — Other Ambulatory Visit: Payer: Self-pay

## 2021-07-21 ENCOUNTER — Ambulatory Visit: Payer: BC Managed Care – PPO | Attending: Surgery

## 2021-07-21 VITALS — Wt 253.0 lb

## 2021-07-21 DIAGNOSIS — Z483 Aftercare following surgery for neoplasm: Secondary | ICD-10-CM | POA: Insufficient documentation

## 2021-07-21 DIAGNOSIS — F3289 Other specified depressive episodes: Secondary | ICD-10-CM | POA: Diagnosis not present

## 2021-07-21 DIAGNOSIS — F419 Anxiety disorder, unspecified: Secondary | ICD-10-CM | POA: Diagnosis not present

## 2021-07-21 NOTE — Therapy (Signed)
[facility] @ [facility] [place], [place], [address] Phone: [phone]   Fax:  [phone]  Physical Therapy Treatment  Patient Details  Name: Susan Gould MRN: [id number] Date of Birth: 1965/08/17 Referring Provider (PT): Dr. [name]   Encounter Date: 07/21/2021   PT End of Session - 07/21/21 0851     Visit Number 2   # unchanged due to screen only   PT Start Time 0845    PT Stop Time 0851    PT Time Calculation (min) 6 min    Activity Tolerance Patient tolerated treatment well    Behavior During Therapy [facility] for tasks assessed/performed             Past Medical History:  Diagnosis Date   Allergic rhinitis, cause unspecified    cat/dog/ragweed/mold/wool, s/p immunotherapy x 3-4 yrs   Anxiety 2006   Asthma    (Dr. Joya Gaskins)   Breast cancer [facility]) 06/2020   Colon polyps 2007   Dr. Oletta Lamas (she was told repeat in 10 years)   COVID-19 virus infection 12/2020   Depression 2006   Endometriosis    Dr. Leo Grosser   Family history of breast cancer in female    GERD (gastroesophageal reflux disease)    Heart murmur    Hemorrhoid    internal and external   History of radiation therapy 12/06/2020   right breast 11/07/2020-12/06/2020  Dr Gery Pray   Hyperlipidemia    Hypertension    Insulin resistance    Dr. Leo Grosser   Medial meniscus tear    Left. Surgery [name].  NOT seen on MRI 06/2013   Obesity    OSA (obstructive sleep apnea)    on CPAP (Dr. Gwenette Greet)    Past Surgical History:  Procedure Laterality Date   BREAST LUMPECTOMY WITH SENTINEL LYMPH NODE BIOPSY Right 10/03/2020   Procedure: RIGHT BREAST LUMPECTOMY WITH SENTINEL LYMPH NODE BIOPSY;  Surgeon: [name], MD;  Location: [place];  Service: General;  Laterality: Right;   CARPAL TUNNEL RELEASE Right 12/28/2019   Dr. Percell Miller   CARPAL TUNNEL RELEASE Left 02/22/2020   [place] CYST EXCISION  2003   RIGHT 3rd finger   JOINT REPLACEMENT  2017   KNEE ARTHROSCOPY Right 04/17/2021   Dr. Fredonia Highland (torn meniscus)   KNEE SURGERY  '95 and '97   BILATERAL KNEE   LAPAROSCOPY  '98, '01   endometriosis (Dr. Leo Grosser)   LASIK  2000   tibial tuberoplasty  90's   left    Vitals:   07/21/21 0848  Weight: 253 lb (114.8 kg)     Subjective Assessment - 07/21/21 0849     Subjective Pt returns for her 3 month L-Dex screen.    Pertinent History Patient was diagnosed on 09/02/2020 with right grade II invasive ductal carcinoma breast cancer. She underwent a right lumpectomy and sentinel node biopsy (1 negative) on 10/03/2020. It is ER/PR positive and HER2 negative with a Ki67 of 30%.                    L-DEX FLOWSHEETS - 07/21/21 0800       L-DEX LYMPHEDEMA SCREENING   Measurement Type Unilateral    L-DEX MEASUREMENT EXTREMITY Upper Extremity    POSITION  Standing    DOMINANT SIDE Right    At Risk Side Right    BASELINE SCORE (UNILATERAL) -  1.2    L-DEX SCORE (UNILATERAL) 0    VALUE CHANGE (UNILAT) 1.2                                     PT Long Term Goals - 10/24/20 0956       PT LONG TERM GOAL #1   Title Patient will demonstrate she has regained full shoulder ROM and function post operatively compared to baselines.    Time 8    Period Weeks    Status Achieved                   Plan - 07/21/21 7955     Clinical Impression Statement Pt returns for her 3 month L-Dex screen. Her change from baseline of 1.2 is WNLs so no further treatment is required at this time except to cont every 3 month L-Dex screens which pt is agreeable to.    PT Next Visit Plan Cont every 3 month L-Dex screens for up to 2 years from her SLNB (~10/04/22)    Consulted and Agree with Plan of Care Patient             Patient will benefit from skilled therapeutic intervention in order to improve the following deficits and  impairments:     Visit Diagnosis: Aftercare following surgery for neoplasm     Problem List Patient Active Problem List   Diagnosis Date Noted   Malignant neoplasm of upper-outer quadrant of right breast in female, estrogen receptor positive ([place]) 09/09/2020   Steatohepatitis 07/25/2019   Acute exacerbation of moderate persistent extrinsic asthma 06/02/2016   Class 3 obesity due to excess calories with serious comorbidity and body mass index (BMI) of 45.0 to 49.9 in adult 04/23/2016   Pre-operative clearance 09/23/2015   Mixed hyperlipidemia 07/29/2011   OA (osteoarthritis) of knee 07/29/2011   Morbid obesity with body mass index of 45.0-49.9 in adult [facility]) 03/17/2010   Insulin resistance syndrome 11/13/2009   HYPERLIPIDEMIA 11/13/2009   Anxiety state 11/13/2009   Essential hypertension 11/13/2009   Moderate persistent asthma in adult without complication 83/16/7425   GERD 11/13/2009   ENDOMETRIOSIS 11/13/2009   OSA (obstructive sleep apnea) 11/13/2009    Otelia Limes, PTA 07/21/2021, 8:55 AM  [facility] @ [place], [place], [address] Phone: [phone]   Fax:  [phone]  Name: Susan Gould MRN: [id number] Date of Birth: October 25, 1965

## 2021-07-22 ENCOUNTER — Other Ambulatory Visit: Payer: Self-pay | Admitting: Family Medicine

## 2021-07-22 DIAGNOSIS — G8929 Other chronic pain: Secondary | ICD-10-CM

## 2021-07-28 DIAGNOSIS — F419 Anxiety disorder, unspecified: Secondary | ICD-10-CM | POA: Diagnosis not present

## 2021-07-28 DIAGNOSIS — F3289 Other specified depressive episodes: Secondary | ICD-10-CM | POA: Diagnosis not present

## 2021-08-04 DIAGNOSIS — F419 Anxiety disorder, unspecified: Secondary | ICD-10-CM | POA: Diagnosis not present

## 2021-08-04 DIAGNOSIS — F3289 Other specified depressive episodes: Secondary | ICD-10-CM | POA: Diagnosis not present

## 2021-08-06 DIAGNOSIS — Z01419 Encounter for gynecological examination (general) (routine) without abnormal findings: Secondary | ICD-10-CM | POA: Diagnosis not present

## 2021-08-11 DIAGNOSIS — F419 Anxiety disorder, unspecified: Secondary | ICD-10-CM | POA: Diagnosis not present

## 2021-08-11 DIAGNOSIS — F3289 Other specified depressive episodes: Secondary | ICD-10-CM | POA: Diagnosis not present

## 2021-08-12 DIAGNOSIS — H5021 Vertical strabismus, right eye: Secondary | ICD-10-CM | POA: Diagnosis not present

## 2021-08-12 DIAGNOSIS — H5089 Other specified strabismus: Secondary | ICD-10-CM | POA: Diagnosis not present

## 2021-08-19 ENCOUNTER — Other Ambulatory Visit: Payer: Self-pay | Admitting: Family Medicine

## 2021-08-19 DIAGNOSIS — G8929 Other chronic pain: Secondary | ICD-10-CM

## 2021-08-19 NOTE — Telephone Encounter (Signed)
Is this okay to refill? 

## 2021-08-20 ENCOUNTER — Other Ambulatory Visit: Payer: BC Managed Care – PPO

## 2021-08-20 DIAGNOSIS — F419 Anxiety disorder, unspecified: Secondary | ICD-10-CM | POA: Diagnosis not present

## 2021-08-20 DIAGNOSIS — F3289 Other specified depressive episodes: Secondary | ICD-10-CM | POA: Diagnosis not present

## 2021-08-22 DIAGNOSIS — H5089 Other specified strabismus: Secondary | ICD-10-CM | POA: Diagnosis not present

## 2021-08-22 DIAGNOSIS — M25561 Pain in right knee: Secondary | ICD-10-CM | POA: Diagnosis not present

## 2021-08-25 DIAGNOSIS — F3289 Other specified depressive episodes: Secondary | ICD-10-CM | POA: Diagnosis not present

## 2021-08-25 DIAGNOSIS — F419 Anxiety disorder, unspecified: Secondary | ICD-10-CM | POA: Diagnosis not present

## 2021-09-01 DIAGNOSIS — F3289 Other specified depressive episodes: Secondary | ICD-10-CM | POA: Diagnosis not present

## 2021-09-01 DIAGNOSIS — F419 Anxiety disorder, unspecified: Secondary | ICD-10-CM | POA: Diagnosis not present

## 2021-09-03 DIAGNOSIS — M1711 Unilateral primary osteoarthritis, right knee: Secondary | ICD-10-CM | POA: Diagnosis not present

## 2021-09-04 ENCOUNTER — Telehealth: Payer: Self-pay | Admitting: Hematology and Oncology

## 2021-09-04 NOTE — Telephone Encounter (Signed)
Rescheduled appointment per providers template. Left message.  ? ?

## 2021-09-08 DIAGNOSIS — F3289 Other specified depressive episodes: Secondary | ICD-10-CM | POA: Diagnosis not present

## 2021-09-08 DIAGNOSIS — F419 Anxiety disorder, unspecified: Secondary | ICD-10-CM | POA: Diagnosis not present

## 2021-09-10 DIAGNOSIS — M1711 Unilateral primary osteoarthritis, right knee: Secondary | ICD-10-CM | POA: Diagnosis not present

## 2021-09-15 DIAGNOSIS — F419 Anxiety disorder, unspecified: Secondary | ICD-10-CM | POA: Diagnosis not present

## 2021-09-15 DIAGNOSIS — F3289 Other specified depressive episodes: Secondary | ICD-10-CM | POA: Diagnosis not present

## 2021-09-17 DIAGNOSIS — M1711 Unilateral primary osteoarthritis, right knee: Secondary | ICD-10-CM | POA: Diagnosis not present

## 2021-09-22 DIAGNOSIS — F419 Anxiety disorder, unspecified: Secondary | ICD-10-CM | POA: Diagnosis not present

## 2021-09-22 DIAGNOSIS — F3289 Other specified depressive episodes: Secondary | ICD-10-CM | POA: Diagnosis not present

## 2021-09-23 DIAGNOSIS — H4912 Fourth [trochlear] nerve palsy, left eye: Secondary | ICD-10-CM | POA: Diagnosis not present

## 2021-09-23 DIAGNOSIS — H5213 Myopia, bilateral: Secondary | ICD-10-CM | POA: Diagnosis not present

## 2021-09-29 DIAGNOSIS — F3289 Other specified depressive episodes: Secondary | ICD-10-CM | POA: Diagnosis not present

## 2021-09-29 DIAGNOSIS — F419 Anxiety disorder, unspecified: Secondary | ICD-10-CM | POA: Diagnosis not present

## 2021-10-02 DIAGNOSIS — M47816 Spondylosis without myelopathy or radiculopathy, lumbar region: Secondary | ICD-10-CM | POA: Diagnosis not present

## 2021-10-02 DIAGNOSIS — M545 Low back pain, unspecified: Secondary | ICD-10-CM | POA: Diagnosis not present

## 2021-10-06 DIAGNOSIS — F3289 Other specified depressive episodes: Secondary | ICD-10-CM | POA: Diagnosis not present

## 2021-10-06 DIAGNOSIS — F419 Anxiety disorder, unspecified: Secondary | ICD-10-CM | POA: Diagnosis not present

## 2021-10-13 DIAGNOSIS — F3289 Other specified depressive episodes: Secondary | ICD-10-CM | POA: Diagnosis not present

## 2021-10-13 DIAGNOSIS — F419 Anxiety disorder, unspecified: Secondary | ICD-10-CM | POA: Diagnosis not present

## 2021-10-19 ENCOUNTER — Other Ambulatory Visit: Payer: Self-pay | Admitting: Family Medicine

## 2021-10-19 DIAGNOSIS — I10 Essential (primary) hypertension: Secondary | ICD-10-CM

## 2021-10-19 DIAGNOSIS — G8929 Other chronic pain: Secondary | ICD-10-CM

## 2021-10-20 ENCOUNTER — Other Ambulatory Visit: Payer: Self-pay | Admitting: *Deleted

## 2021-10-20 ENCOUNTER — Ambulatory Visit: Payer: BC Managed Care – PPO | Attending: Surgery

## 2021-10-20 VITALS — Wt 264.2 lb

## 2021-10-20 DIAGNOSIS — F419 Anxiety disorder, unspecified: Secondary | ICD-10-CM | POA: Diagnosis not present

## 2021-10-20 DIAGNOSIS — F3289 Other specified depressive episodes: Secondary | ICD-10-CM | POA: Diagnosis not present

## 2021-10-20 DIAGNOSIS — Z483 Aftercare following surgery for neoplasm: Secondary | ICD-10-CM | POA: Insufficient documentation

## 2021-10-20 MED ORDER — GABAPENTIN 300 MG PO CAPS: ORAL_CAPSULE | ORAL | 0 refills | Status: DC

## 2021-10-20 NOTE — Telephone Encounter (Signed)
[facility] is requesting to fill pt diclofenac. Please advise kh ?

## 2021-10-20 NOTE — Therapy (Signed)
?OUTPATIENT PHYSICAL THERAPY SOZO SCREENING NOTE ? ? ?Patient Name: Susan Gould ?MRN: [id number] ?DOB:[birth year], 56 y.o., female ?Today's Date: 10/20/2021 ? ?PCP: Rita Ohara, MD ?REFERRING PROVIDER: Rita Ohara, MD ? ? PT End of Session - 10/20/21 0915   ? ? Visit Number 2   # unchanged due to screen only  ? PT Start Time [phone]   ? PT Stop Time 0917   ? PT Time Calculation (min) 5 min   ? Activity Tolerance Patient tolerated treatment well   ? Behavior During Therapy [facility] for tasks assessed/performed   ? ?  ?  ? ?  ? ? ?Past Medical History:  ?Diagnosis Date  ? Allergic rhinitis, cause unspecified   ? cat/dog/ragweed/mold/wool, s/p immunotherapy x 3-4 yrs  ? Anxiety 2006  ? Asthma   ? (Dr. Joya Gaskins)  ? Breast cancer ([place]) 06/2020  ? Colon polyps 2007  ? Dr. Oletta Lamas (she was told repeat in 10 years)  ? COVID-19 virus infection 12/2020  ? Depression 2006  ? Endometriosis   ? Dr. Leo Grosser  ? Family history of breast cancer in female   ? GERD (gastroesophageal reflux disease)   ? Heart murmur   ? Hemorrhoid   ? internal and external  ? History of radiation therapy 12/06/2020  ? right breast 11/07/2020-12/06/2020  Dr Gery Pray  ? Hyperlipidemia   ? Hypertension   ? Insulin resistance   ? Dr. Leo Grosser  ? Medial meniscus tear   ? Left. Surgery [name].  NOT seen on MRI 06/2013  ? Obesity   ? OSA (obstructive sleep apnea)   ? on CPAP (Dr. Gwenette Greet)  ? ?Past Surgical History:  ?Procedure Laterality Date  ? BREAST LUMPECTOMY WITH SENTINEL LYMPH NODE BIOPSY Right 10/03/2020  ? Procedure: RIGHT BREAST LUMPECTOMY WITH SENTINEL LYMPH NODE BIOPSY;  Surgeon: [name], MD;  Location: [place];  Service: General;  Laterality: Right;  ? CARPAL TUNNEL RELEASE Right 12/28/2019  ? Dr. Percell Miller  ? CARPAL TUNNEL RELEASE Left 02/22/2020  ? CHOLECYSTECTOMY  1986  ? EYE SURGERY  2000  ? FINGER GANGLION CYST EXCISION  2003  ? RIGHT 3rd finger  ? JOINT REPLACEMENT  2017  ? KNEE ARTHROSCOPY Right 04/17/2021  ? Dr. Fredonia Highland (torn meniscus)  ? KNEE SURGERY  '95 and '97  ? BILATERAL KNEE  ? LAPAROSCOPY  '98, '01  ? endometriosis (Dr. Leo Grosser)  ? LASIK  2000  ? tibial tuberoplasty  90's  ? left  ? ?Patient Active Problem List  ? Diagnosis Date Noted  ? Malignant neoplasm of upper-outer quadrant of right breast in female, estrogen receptor positive ([place]) 09/09/2020  ? Steatohepatitis 07/25/2019  ? Acute exacerbation of moderate persistent extrinsic asthma 06/02/2016  ? Class 3 obesity due to excess calories with serious comorbidity and body mass index (BMI) of 45.0 to 49.9 in adult 04/23/2016  ? Pre-operative clearance 09/23/2015  ? Mixed hyperlipidemia 07/29/2011  ? OA (osteoarthritis) of knee 07/29/2011  ? Morbid obesity with body mass index of 45.0-49.9 in adult [facility]) 03/17/2010  ? Insulin resistance syndrome 11/13/2009  ? HYPERLIPIDEMIA 11/13/2009  ? Anxiety state 11/13/2009  ? Essential hypertension 11/13/2009  ? Moderate persistent asthma in adult without complication 94/50/3888  ? GERD 11/13/2009  ? ENDOMETRIOSIS 11/13/2009  ? OSA (obstructive sleep apnea) 11/13/2009  ? ? ?REFERRING DIAG: right breast cancer at risk for lymphedema ? ?THERAPY DIAG:  ?Aftercare following surgery for neoplasm ? ?PERTINENT HISTORY: Patient was diagnosed on 09/02/2020 with  right grade II invasive ductal carcinoma breast cancer. She underwent a right lumpectomy and sentinel node biopsy (1 negative) on 10/03/2020. It is ER/PR positive and HER2 negative with a Ki67 of 30%.  ? ?PRECAUTIONS: right UE Lymphedema risk, None ? ?SUBJECTIVE: Pt returns for her 3 month L-Dex screen.  ? ?PAIN:  ?Are you having pain? No ? ?SOZO SCREENING: ?Patient was assessed today using the SOZO machine to determine the lymphedema index score. This was compared to her baseline score. It was determined that she is within the recommended range when compared to her baseline and no further action is needed at this time. She will continue SOZO screenings. These are done every 3  months for 2 years post operatively followed by every 6 months for 2 years, and then annually. ? ? ? ?Otelia Limes, PTA ?10/20/2021, 9:18 AM ? ?  ? ?

## 2021-10-23 ENCOUNTER — Other Ambulatory Visit: Payer: Self-pay | Admitting: *Deleted

## 2021-10-23 DIAGNOSIS — M47816 Spondylosis without myelopathy or radiculopathy, lumbar region: Secondary | ICD-10-CM | POA: Diagnosis not present

## 2021-10-23 MED ORDER — ANASTROZOLE 1 MG PO TABS: 1.0000 mg | ORAL_TABLET | Freq: Every day | ORAL | 4 refills | Status: DC

## 2021-10-27 DIAGNOSIS — F419 Anxiety disorder, unspecified: Secondary | ICD-10-CM | POA: Diagnosis not present

## 2021-10-27 DIAGNOSIS — F3289 Other specified depressive episodes: Secondary | ICD-10-CM | POA: Diagnosis not present

## 2021-10-28 NOTE — Progress Notes (Signed)
Chief Complaint  ?Patient presents with  ? Diabetes  ?  Fasting med check. She would like Tdap and Pfizer Bivalent but we can only L arm-can we do both in same arm? Sunosi was given as a sample by her psych-would like to know if you think this is okay for her to start with potential side effects (blood pressure).   ? ? ?Patient presents for med check on chronic problems. ?  ?She was recently referred to [facility] bariatric clinic (by her GYN) for weight loss.  She is scheduled for mid-May. She admits that she has been eating worse recently, knowing that her diet would need to change soon. ? ?Hypertension.  She is currently taking 71m losartan, 12.551mHCTZ and amlodipine 7.61m50mShe is tolerating this without side effects.   ?She denies headaches, dizziness (occ spinning type, never lightheaded), chest pain, shortness of breath, muscle cramps (only if not well hydrated), edema. ?BP's have been fine at other doctor's visits, not checked at home. ?BP Readings from Last 3 Encounters:  ?10/29/21 120/70  ?06/10/21 138/74  ?04/30/21 108/60  ? ?  ?Mixed hyperlipidemia--She continues on atorvastatin 47m103mily and denies side effects.  She is now taking OTC omega-3 fish oil 2000mg50m (since Vascepa and Lovaza weren't covered by her insurance).  ?She tries to follow a lowfat diet, but admits that she hasn't been recently (and has upcoming trip to [place] her sister), knowing that her diet will change significantly after going to the bariatric clinic. ?  ?Lab Results  ?Component Value Date  ? CHOL 187 04/30/2021  ? HDL 46 04/30/2021  ? LDLCALC 101 (H) 04/30/2021  ? TRIG 236 (H) 04/30/2021  ? CHOLHDL 4.1 04/30/2021  ? ?  ?PCOS, Insulin-resistance, pre-diabetes:  Took metformin for a long time, stopped due to ongoing issues with diarrhea.  A1c was 5.9% in 04/2021 (off metformin). ?She had noted some mild constipation since stopping the metformin, and we had discussed increased water intake, metamucil, high fiber diet.  Bowels  are better, as long as she drinks enough water. ?She recently completed a steroid course for her back. ? ?At last visit there was concern for a 4th nerve palsy (having double vision vertically)--she saw a specialist, was told it was NOT a 4th nerve palsy.  Had thyroid tests done and r/o thyroid eye disease.  She was told that her eye muscles are tight, and that prisms are the treatment.  Currently has stickers on her glasses, which are effective. ?  ?Chronic back pain: Tizanidine at night helps (pain sometimes interferes with her sleep). Pain is tolerable. Spreads to buttocks, not into legs. She took a steroid taper recently (Dr. ObazeAletha Halimt with her back and knee.  She was told it was referred pain from SI joint. ?She had Synvisc x 3 in the R knee, so far not seeing much benefit . May need knee replacement. ?  ?Depression: She continues to see psychiatrist and counselor. She remains on Pristiq and doing well, feeling like she is doing 75-80%.  Dr. Kaur Toy Careced she seemed tired, and Sunosi samples were given. Patient is concerned due to potential effect on BP. ?Going on vacation Friday.  Has some pre-vacation stress, took a xanax last night. ?  ?Breast cancer: Last mammo was in 03/2021. She is s/p right lumpectomy and sentinel lymph node biopsy in 09/2020, completed radiation therapy on 12/06/2020 under Dr. KinarSondra Come started anastrozole on 12/31/2020.  She is tolerating this medication, hot flashes are  tolerable. She had a normal bone density test in 06/2021. Had a normal screen for lymphedema recently. ?  ?Under the care of pulmonary for asthma and OSA. She is doing well on Arnuity, not needing rescue inhaler.  Compliant with use of CPAP. ? ? ?PMH, PSH, SH reviewed ? ?Outpatient Encounter Medications as of 10/29/2021  ?Medication Sig Note  ? acetaminophen (TYLENOL) 650 MG CR tablet Take 1,300 mg by mouth every 8 (eight) hours as needed for pain.   ? ALPRAZolam (XANAX) 0.5 MG tablet TAKE 1/2 -1 TABLET BY MOUTH  3 TIMES DAILY AS NEEDED FOR ANXIETY 10/29/2021: Took .25 last night  ? anastrozole (ARIMIDEX) 1 MG tablet Take 1 tablet (1 mg total) by mouth daily. Start December 31, 2020   ? ARNUITY ELLIPTA 100 MCG/ACT AEPB INHALE 1 PUFF INTO THE LUNGS DAILY   ? betamethasone dipropionate 0.05 % lotion SMARTSIG:Sparingly Topical 3 Times a Week   ? CALCIUM PO Take 1,000 mg by mouth daily.   ? cholecalciferol (VITAMIN D3) 25 MCG (1000 UNIT) tablet Take 1,000 Units by mouth daily.   ? Desvenlafaxine Succinate (PRISTIQ PO) Take 100 mg by mouth daily.   ? diclofenac (VOLTAREN) 75 MG EC tablet TAKE 1 TABLET(75 MG) BY MOUTH TWICE DAILY WITH FOOD AS NEEDED FOR MILD PAIN OR MODERATE PAIN 10/29/2021: Once daily  ? fluticasone (FLONASE) 50 MCG/ACT nasal spray INSTILL 1 TO 2 SPRAYS INTO EACH NOSTRIL ONCE A DAY   ? gabapentin (NEURONTIN) 300 MG capsule TAKE 1 CAPSULE(300 MG) BY MOUTH TWICE DAILY   ? hydrochlorothiazide (MICROZIDE) 12.5 MG capsule TAKE 1 CAPSULE(12.5 MG) BY MOUTH DAILY   ? L-Methylfolate-Algae (DEPLIN 15 PO) Take 1 capsule by mouth daily.   ? loratadine (CLARITIN) 10 MG tablet Take 10 mg by mouth daily.   ? Multiple Vitamins-Minerals (ONE-A-DAY MENS 50+ PO) Take 1 tablet by mouth daily.   ? Omega-3 Fatty Acids (FISH OIL) 1000 MG CAPS Take 2,000 mg by mouth in the morning and at bedtime.   ? omeprazole (PRILOSEC) 20 MG capsule Take 20 mg by mouth daily.   ? tiZANidine (ZANAFLEX) 4 MG tablet Take 4 mg by mouth as needed for muscle spasms.  04/30/2021: Takes nightly  ? vitamin B-12 (CYANOCOBALAMIN) 1000 MCG tablet Take 1,000 mcg by mouth daily.   ? vitamin C (ASCORBIC ACID) 500 MG tablet Take 500 mg by mouth daily.   ? [DISCONTINUED] amLODipine (NORVASC) 5 MG tablet TAKE 1 AND 1/2 TABLETS(7.5 MG) BY MOUTH DAILY   ? [DISCONTINUED] atorvastatin (LIPITOR) 80 MG tablet TAKE 1 TABLET(80 MG) BY MOUTH DAILY   ? [DISCONTINUED] losartan (COZAAR) 50 MG tablet Take 1 tablet (50 mg total) by mouth daily.   ? amLODipine (NORVASC) 5 MG tablet TAKE 1 AND  1/2 TABLETS(7.5 MG) BY MOUTH DAILY   ? atorvastatin (LIPITOR) 80 MG tablet TAKE 1 TABLET(80 MG) BY MOUTH DAILY   ? losartan (COZAAR) 50 MG tablet Take 1 tablet (50 mg total) by mouth daily.   ? [DISCONTINUED] ondansetron (ZOFRAN) 4 MG tablet Take 4 mg by mouth every 8 (eight) hours as needed for nausea or vomiting. (Patient not taking: Reported on 04/30/2021)   ? ?No facility-administered encounter medications on file as of 10/29/2021.  ? ?Allergies  ?Allergen Reactions  ? Meloxicam Itching and Rash  ? ? ?ROS: no fever, chills, headaches, dizziness, chest pain, shortness of breath, edema, urinary complaints. No bleeding, bruising, rash ?Back pain, chronic, manageable, improved after recent steroid course. ?Knee pain persists, may need  joint replacement. ?Hot flashes are tolerable ?Moods are good overall (75-80%).  Feels like her energy is okay ? ? ? ?PHYSICAL EXAM: ? ?BP 120/70   Pulse 80   Ht 5' 4"  (1.626 m)   Wt 258 lb (117 kg)   LMP 02/09/2009 (Exact Date)   BMI 44.29 kg/m?  ? ?Wt Readings from Last 3 Encounters:  ?10/29/21 258 lb (117 kg)  ?10/20/21 264 lb 4 oz (119.9 kg)  ?07/21/21 253 lb (114.8 kg)  ? ?Pleasant female in no distress ?HEENT: conjunctiva and sclera are clear, EOMI. OP clear ?Neck: no lymphadenopathy, thyromegaly or mass, no bruit ?Heart: regular rate and rhythm, no murmur ?Lungs: clear, no wheezing ?Back: no CVA tenderness. No spinal tenderness, muscle spasm ?Abdomen: soft, obese, nontender ?Extremities: no edema, normal pulses ?Skin: normal turgor, no visible rash ?Neuro: alert and oriented, normal gait ?Psych:  Normal eye contact, speech, hygiene and grooming ? ? ?Lab Results  ?Component Value Date  ? HGBA1C 6.5 (A) 10/29/2021  ? ? ?ASSESSMENT/PLAN: ? ?Prediabetes - pt w/pre-DM, insulin resistance. a1c now 6.5, but recent steroids. counseled re: diet, exercise, wt loss, potential for meds to help (ozempic/mounjaro) - Plan: HgB A1c ? ?Malignant neoplasm of upper-outer quadrant of right breast  in female, estrogen receptor positive ([place]) - tolerating meds, doing well ? ?Moderate persistent asthma in adult without complication - controlled ? ?Depression, recurrent ([place]) - under care of Dr. Asencion Gowda

## 2021-10-29 ENCOUNTER — Ambulatory Visit (INDEPENDENT_AMBULATORY_CARE_PROVIDER_SITE_OTHER): Payer: BC Managed Care – PPO | Admitting: Family Medicine

## 2021-10-29 ENCOUNTER — Encounter: Payer: Self-pay | Admitting: Family Medicine

## 2021-10-29 VITALS — BP 120/70 | HR 80 | Ht 64.0 in | Wt 258.0 lb

## 2021-10-29 DIAGNOSIS — K7581 Nonalcoholic steatohepatitis (NASH): Secondary | ICD-10-CM

## 2021-10-29 DIAGNOSIS — F339 Major depressive disorder, recurrent, unspecified: Secondary | ICD-10-CM | POA: Diagnosis not present

## 2021-10-29 DIAGNOSIS — Z170 Estrogen receptor positive status [ER+]: Secondary | ICD-10-CM

## 2021-10-29 DIAGNOSIS — R7303 Prediabetes: Secondary | ICD-10-CM

## 2021-10-29 DIAGNOSIS — Z23 Encounter for immunization: Secondary | ICD-10-CM

## 2021-10-29 DIAGNOSIS — Z5181 Encounter for therapeutic drug level monitoring: Secondary | ICD-10-CM

## 2021-10-29 DIAGNOSIS — C50411 Malignant neoplasm of upper-outer quadrant of right female breast: Secondary | ICD-10-CM

## 2021-10-29 DIAGNOSIS — E8881 Metabolic syndrome: Secondary | ICD-10-CM

## 2021-10-29 DIAGNOSIS — I10 Essential (primary) hypertension: Secondary | ICD-10-CM

## 2021-10-29 DIAGNOSIS — J4540 Moderate persistent asthma, uncomplicated: Secondary | ICD-10-CM

## 2021-10-29 DIAGNOSIS — E782 Mixed hyperlipidemia: Secondary | ICD-10-CM

## 2021-10-29 LAB — POCT GLYCOSYLATED HEMOGLOBIN (HGB A1C): Hemoglobin A1C: 6.5 % — AB (ref 4.0–5.6)

## 2021-10-29 MED ORDER — AMLODIPINE BESYLATE 5 MG PO TABS: ORAL_TABLET | ORAL | 1 refills | Status: DC

## 2021-10-29 MED ORDER — LOSARTAN POTASSIUM 50 MG PO TABS: 50.0000 mg | ORAL_TABLET | Freq: Every day | ORAL | 1 refills | Status: DC

## 2021-10-29 MED ORDER — ATORVASTATIN CALCIUM 80 MG PO TABS: ORAL_TABLET | ORAL | 1 refills | Status: DC

## 2021-10-29 NOTE — Patient Instructions (Addendum)
Return for nurse visit in 2 weeks (or later) for your tetanus booster (TdaP). ?Let us know when you have labs done through [facility] so that I can look them up. ? ?I think Mounjaro or Ozempic would be good medication choices to help with weight loss, now that we can officially say diabetes (though your recent prednisone course likely contributed to higher sugars). You can discuss this with the [facility] bariatric clinic, if medications are being considered. ? ?

## 2021-11-10 DIAGNOSIS — F3289 Other specified depressive episodes: Secondary | ICD-10-CM | POA: Diagnosis not present

## 2021-11-10 DIAGNOSIS — F419 Anxiety disorder, unspecified: Secondary | ICD-10-CM | POA: Diagnosis not present

## 2021-11-17 DIAGNOSIS — F3289 Other specified depressive episodes: Secondary | ICD-10-CM | POA: Diagnosis not present

## 2021-11-17 DIAGNOSIS — F419 Anxiety disorder, unspecified: Secondary | ICD-10-CM | POA: Diagnosis not present

## 2021-11-20 DIAGNOSIS — R635 Abnormal weight gain: Secondary | ICD-10-CM | POA: Diagnosis not present

## 2021-11-21 ENCOUNTER — Encounter: Payer: Self-pay | Admitting: Family Medicine

## 2021-11-26 DIAGNOSIS — M25561 Pain in right knee: Secondary | ICD-10-CM | POA: Diagnosis not present

## 2021-11-27 DIAGNOSIS — Z6841 Body Mass Index (BMI) 40.0 and over, adult: Secondary | ICD-10-CM | POA: Diagnosis not present

## 2021-11-27 DIAGNOSIS — K219 Gastro-esophageal reflux disease without esophagitis: Secondary | ICD-10-CM | POA: Diagnosis not present

## 2021-11-27 DIAGNOSIS — E119 Type 2 diabetes mellitus without complications: Secondary | ICD-10-CM | POA: Diagnosis not present

## 2021-11-27 DIAGNOSIS — E669 Obesity, unspecified: Secondary | ICD-10-CM | POA: Diagnosis not present

## 2021-11-27 DIAGNOSIS — G4733 Obstructive sleep apnea (adult) (pediatric): Secondary | ICD-10-CM | POA: Diagnosis not present

## 2021-12-01 DIAGNOSIS — F419 Anxiety disorder, unspecified: Secondary | ICD-10-CM | POA: Diagnosis not present

## 2021-12-01 DIAGNOSIS — F3289 Other specified depressive episodes: Secondary | ICD-10-CM | POA: Diagnosis not present

## 2021-12-02 ENCOUNTER — Other Ambulatory Visit (INDEPENDENT_AMBULATORY_CARE_PROVIDER_SITE_OTHER): Payer: BC Managed Care – PPO

## 2021-12-02 DIAGNOSIS — Z23 Encounter for immunization: Secondary | ICD-10-CM

## 2021-12-03 DIAGNOSIS — Z6841 Body Mass Index (BMI) 40.0 and over, adult: Secondary | ICD-10-CM | POA: Diagnosis not present

## 2021-12-03 DIAGNOSIS — R635 Abnormal weight gain: Secondary | ICD-10-CM | POA: Diagnosis not present

## 2021-12-04 DIAGNOSIS — M47816 Spondylosis without myelopathy or radiculopathy, lumbar region: Secondary | ICD-10-CM | POA: Diagnosis not present

## 2021-12-08 DIAGNOSIS — F419 Anxiety disorder, unspecified: Secondary | ICD-10-CM | POA: Diagnosis not present

## 2021-12-08 DIAGNOSIS — F3289 Other specified depressive episodes: Secondary | ICD-10-CM | POA: Diagnosis not present

## 2021-12-09 ENCOUNTER — Ambulatory Visit: Payer: BC Managed Care – PPO | Admitting: Hematology and Oncology

## 2021-12-11 DIAGNOSIS — Z6841 Body Mass Index (BMI) 40.0 and over, adult: Secondary | ICD-10-CM | POA: Diagnosis not present

## 2021-12-11 DIAGNOSIS — G4733 Obstructive sleep apnea (adult) (pediatric): Secondary | ICD-10-CM | POA: Diagnosis not present

## 2021-12-11 DIAGNOSIS — Z9989 Dependence on other enabling machines and devices: Secondary | ICD-10-CM | POA: Diagnosis not present

## 2021-12-15 DIAGNOSIS — F3289 Other specified depressive episodes: Secondary | ICD-10-CM | POA: Diagnosis not present

## 2021-12-15 DIAGNOSIS — F419 Anxiety disorder, unspecified: Secondary | ICD-10-CM | POA: Diagnosis not present

## 2021-12-16 ENCOUNTER — Inpatient Hospital Stay: Payer: BC Managed Care – PPO | Attending: Hematology and Oncology | Admitting: Hematology and Oncology

## 2021-12-16 ENCOUNTER — Encounter: Payer: Self-pay | Admitting: Hematology and Oncology

## 2021-12-16 ENCOUNTER — Other Ambulatory Visit: Payer: Self-pay

## 2021-12-16 VITALS — BP 138/81 | HR 103 | Temp 97.8°F | Resp 18 | Ht 64.0 in | Wt 254.7 lb

## 2021-12-16 DIAGNOSIS — Z79899 Other long term (current) drug therapy: Secondary | ICD-10-CM | POA: Diagnosis not present

## 2021-12-16 DIAGNOSIS — C50411 Malignant neoplasm of upper-outer quadrant of right female breast: Secondary | ICD-10-CM | POA: Diagnosis not present

## 2021-12-16 DIAGNOSIS — Z923 Personal history of irradiation: Secondary | ICD-10-CM | POA: Diagnosis not present

## 2021-12-16 DIAGNOSIS — Z7984 Long term (current) use of oral hypoglycemic drugs: Secondary | ICD-10-CM | POA: Insufficient documentation

## 2021-12-16 DIAGNOSIS — Z170 Estrogen receptor positive status [ER+]: Secondary | ICD-10-CM | POA: Diagnosis not present

## 2021-12-16 DIAGNOSIS — N951 Menopausal and female climacteric states: Secondary | ICD-10-CM | POA: Diagnosis not present

## 2021-12-16 DIAGNOSIS — Z79811 Long term (current) use of aromatase inhibitors: Secondary | ICD-10-CM | POA: Insufficient documentation

## 2021-12-16 NOTE — Progress Notes (Signed)
[place]  [phone] [phone]     ID: Susan Gould DOB: [birth year]  MR#: [id number]  YFV#:[id number]  Patient Care Team: Rita Ohara, MD as PCP - General (Family Medicine) [name], MD as Consulting Physician (General Surgery) Gery Pray, MD as Consulting Physician (Radiation Oncology) Laroy Apple, MD as Consulting Physician (Physical Medicine and Rehabilitation) Everett Graff, MD as Consulting Physician (Obstetrics and Gynecology) Arta Silence, MD as Consulting Physician (Gastroenterology) Deneise Lever, MD as Consulting Physician (Pulmonary Disease) Danice Goltz, MD as Consulting Physician (Ophthalmology) Benay Pike, MD as Consulting Physician (Hematology and Oncology) Benay Pike, MD OTHER MD:  CHIEF COMPLAINT: estrogen receptor positive breast cancer  CURRENT TREATMENT: anastrozole   INTERVAL HISTORY: Ms. Susan Gould is here for follow-up on anastrozole.  Last mammogram October 2022, no evidence of new or recurrent breast carcinoma, slightly benign postlumpectomy changes on the right. Last bone density January 2023, normal. She is tolerating anastrazole very well.  She denies any new complaints No new changes in the breast. No change in breathing or bowel or ueinary habits No new neurologic complaints. She just joined weight loss program, doing well.  She is also on Mounjaro and metformin. Rest of the pertinent 10 point ROS reviewed and negative.  REVIEW OF SYSTEMS: Review of Systems  Constitutional:  Negative for appetite change, chills, fatigue, fever and unexpected weight change.  HENT:   Negative for hearing loss, lump/mass and trouble swallowing.   Eyes:  Negative for eye problems and icterus.  Respiratory:  Negative for chest tightness, cough and shortness of breath.   Cardiovascular:  Negative for chest pain, leg swelling and palpitations.  Gastrointestinal:  Negative for abdominal  distention, abdominal pain, constipation, diarrhea, nausea and vomiting.  Endocrine: Positive for hot flashes (occasional).  Genitourinary:  Negative for difficulty urinating.   Musculoskeletal:  Negative for arthralgias.  Skin:  Negative for itching and rash.  Neurological:  Negative for dizziness, extremity weakness, headaches and numbness.  Hematological:  Negative for adenopathy. Does not bruise/bleed easily.  Psychiatric/Behavioral:  Negative for depression. The patient is not nervous/anxious.      COVID 19 VACCINATION STATUS: Pfizer x4, most recently 09/2020; infection 12/2020   HISTORY OF CURRENT ILLNESS: From the original intake note:  [name] herself palpated a right breast abnormality. Of note, screening mammogram on 05/31/2020 was negative. She underwent right diagnostic mammography with tomography and right breast ultrasonography at [facility] on 08/29/2020 showing: breast density category B; palpable indeterminate 0.6 cm mass in right breast at 12 o'clock retroareolar region; right axilla negative for adenopathy.  Accordingly on 09/02/2020 she proceeded to biopsy of the right breast area in question. The pathology from this procedure ([id number]) showed: invasive ductal carcinoma, grade 2; ductal papilloma. Prognostic indicators significant for: estrogen receptor, >95% positive with strong staining intensity and progesterone receptor, 15% positive with moderate staining intensity. Proliferation marker Ki67 at 30%. HER2 equivocal by immunohistochemistry (2+), but negative by fluorescent in situ hybridization with a signals ratio 0.92 and number per cell 1.7.   Cancer Staging  Malignant neoplasm of upper-outer quadrant of right breast in female, estrogen receptor positive ([place]) Staging form: Breast, AJCC 8th Edition - Clinical stage from 09/11/2020: Stage IA (cT1b, cN0, cM0, G2, ER+, PR+, HER2-) - Signed by Chauncey Cruel, MD on 09/11/2020 Stage prefix: Initial  diagnosis Histologic grading system: 3 grade system Laterality: Right Staged by: Pathologist and managing physician Stage used in treatment planning: Yes National guidelines used in treatment  planning: Yes Type of national guideline used in treatment planning: NCCN  The patient's subsequent history is as detailed below.   PAST MEDICAL HISTORY: Past Medical History:  Diagnosis Date   Allergic rhinitis, cause unspecified    cat/dog/ragweed/mold/wool, s/p immunotherapy x 3-4 yrs   Anxiety 2006   Asthma    (Dr. Joya Gaskins)   Breast cancer [facility]) 06/2020   Colon polyps 2007   Dr. Oletta Lamas (she was told repeat in 10 years)   COVID-19 virus infection 12/2020   Depression 2006   Endometriosis    Dr. Leo Grosser   Family history of breast cancer in female    GERD (gastroesophageal reflux disease)    Heart murmur    Hemorrhoid    internal and external   History of radiation therapy 12/06/2020   right breast 11/07/2020-12/06/2020  Dr Gery Pray   Hyperlipidemia    Hypertension    Insulin resistance    Dr. Leo Grosser   Medial meniscus tear    Left. Surgery [name].  NOT seen on MRI 06/2013   Obesity    OSA (obstructive sleep apnea)    on CPAP (Dr. Gwenette Greet)    PAST SURGICAL HISTORY: Past Surgical History:  Procedure Laterality Date   BREAST LUMPECTOMY WITH SENTINEL LYMPH NODE BIOPSY Right 10/03/2020   Procedure: RIGHT BREAST LUMPECTOMY WITH SENTINEL LYMPH NODE BIOPSY;  Surgeon: [name], MD;  Location: [facility];  Service: General;  Laterality: Right;   CARPAL TUNNEL RELEASE Right 12/28/2019   Dr. Percell Miller   CARPAL TUNNEL RELEASE Left 02/22/2020   [place] CYST EXCISION  2003   RIGHT 3rd finger   JOINT REPLACEMENT  2017   KNEE ARTHROSCOPY Right 04/17/2021   Dr. Fredonia Highland (torn meniscus)   KNEE SURGERY  '95 and '97   BILATERAL KNEE   LAPAROSCOPY  '98, '01   endometriosis (Dr. Leo Grosser)   LASIK  2000    tibial tuberoplasty  90's   left    FAMILY HISTORY: Family History  Problem Relation Age of Onset   Diabetes Mother    Hypertension Mother    Hyperlipidemia Mother    Arthritis Mother    Cancer Mother 76       breast cancer   Breast cancer Mother 23   Diabetes Father    Hyperlipidemia Father    Hypertension Father    Asthma Father    Arthritis Father    Heart disease Father        CHF, hypokinesis   COPD Father        due to asbestos exposure; former smoker   Pulmonary embolism Father    Parkinson's disease Father    Hypertension Sister    Colon cancer Maternal Grandmother        late 60's   Cancer Maternal Grandmother        colon   Heart disease Maternal Grandmother        MI in 57's   Breast cancer Paternal Aunt        70's   Cancer Paternal Aunt        breast   Heart disease Paternal Uncle    Diabetes Paternal Uncle    Kidney disease Paternal Uncle   Her father died at age 53 from COPD and Parkinson's. Her mother died at age 65 from a perforated colon. Rahi has one brother and three sisters, all living as of 08/2020. She reports breast  cancer in her mother at age 70 and in a paternal aunt. She also notes colon cancer in her maternal grandmother in her early 32's.    GYNECOLOGIC HISTORY:  Patient's last menstrual period was 01/27/2009 (exact date). Menarche: 56 years old [place] P 0 LMP 01/2009 Contraceptive: used "for years" and stopped in the early 2000's. HRT never used  Hysterectomy? no BSO? 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 She is single. She lives by herself, with her cat.  She is not a Ambulance person.    ADVANCED DIRECTIVES: not in place; believes she has named her brother Makenzi Bannister, who lives in [place] and can be reached at [phone]. Her sister [name] seconds.   HEALTH MAINTENANCE: Social History   Tobacco Use   Smoking status: Never    Smokeless tobacco: Never  Vaping Use   Vaping Use: Never used  Substance Use Topics   Alcohol use: Yes    Alcohol/week: 0.0 standard drinks of alcohol    Comment: 1-2 drinks a month   Drug use: No     Colonoscopy: 05/2018 (Dr. Oletta Lamas), normal, recall 2024 for family history  PAP: 06/2020, negative  Bone density: 03/2015, +0.5   Allergies  Allergen Reactions   Meloxicam Itching and Rash    Current Outpatient Medications  Medication Sig Dispense Refill   metFORMIN (GLUMETZA) 500 MG (MOD) 24 hr tablet Take 500 mg by mouth daily with breakfast.     acetaminophen (TYLENOL) 650 MG CR tablet Take 1,300 mg by mouth every 8 (eight) hours as needed for pain.     ALPRAZolam (XANAX) 0.5 MG tablet TAKE 1/2 -1 TABLET BY MOUTH 3 TIMES DAILY AS NEEDED FOR ANXIETY 20 tablet 0   amLODipine (NORVASC) 5 MG tablet TAKE 1 AND 1/2 TABLETS(7.5 MG) BY MOUTH DAILY 135 tablet 1   anastrozole (ARIMIDEX) 1 MG tablet Take 1 tablet (1 mg total) by mouth daily. Start December 31, 2020 90 tablet 4   ARNUITY ELLIPTA 100 MCG/ACT AEPB INHALE 1 PUFF INTO THE LUNGS DAILY 30 each 3   atorvastatin (LIPITOR) 80 MG tablet TAKE 1 TABLET(80 MG) BY MOUTH DAILY 90 tablet 1   betamethasone dipropionate 0.05 % lotion SMARTSIG:Sparingly Topical 3 Times a Week     CALCIUM PO Take 1,000 mg by mouth daily.     cholecalciferol (VITAMIN D3) 25 MCG (1000 UNIT) tablet Take 1,000 Units by mouth daily.     Desvenlafaxine Succinate (PRISTIQ PO) Take 100 mg by mouth daily.     diclofenac (VOLTAREN) 75 MG EC tablet TAKE 1 TABLET(75 MG) BY MOUTH TWICE DAILY WITH FOOD AS NEEDED FOR MILD PAIN OR MODERATE PAIN 60 tablet 0   fluticasone (FLONASE) 50 MCG/ACT nasal spray INSTILL 1 TO 2 SPRAYS INTO EACH NOSTRIL ONCE A DAY 16 g 11   gabapentin (NEURONTIN) 300 MG capsule TAKE 1 CAPSULE(300 MG) BY MOUTH TWICE DAILY 180 capsule 0   hydrochlorothiazide (MICROZIDE) 12.5 MG capsule TAKE 1 CAPSULE(12.5 MG) BY MOUTH DAILY 90 capsule 1   L-Methylfolate-Algae  (DEPLIN 15 PO) Take 1 capsule by mouth daily.     loratadine (CLARITIN) 10 MG tablet Take 10 mg by mouth daily.     losartan (COZAAR) 50 MG tablet Take 1 tablet (50 mg total) by mouth daily. 90 tablet 1   Multiple Vitamins-Minerals (ONE-A-DAY MENS 50+ PO) Take 1 tablet by mouth daily.     Omega-3 Fatty Acids (FISH  OIL) 1000 MG CAPS Take 2,000 mg by mouth in the morning and at bedtime.     omeprazole (PRILOSEC) 20 MG capsule Take 20 mg by mouth daily.     tiZANidine (ZANAFLEX) 4 MG tablet Take 4 mg by mouth as needed for muscle spasms.      vitamin B-12 (CYANOCOBALAMIN) 1000 MCG tablet Take 1,000 mcg by mouth daily.     vitamin C (ASCORBIC ACID) 500 MG tablet Take 500 mg by mouth daily.     No current facility-administered medications for this visit.    OBJECTIVE: White woman who appears stated age  56:   12/16/21 0820  BP: 138/81  Pulse: (!) 103  Resp: 18  Temp: 97.8 F (36.6 C)  SpO2: 97%      Body mass index is 43.72 kg/m.   Wt Readings from Last 3 Encounters:  12/16/21 254 lb 11.2 oz (115.5 kg)  10/29/21 258 lb (117 kg)  10/20/21 264 lb 4 oz (119.9 kg)      ECOG FS:1 - Symptomatic but completely ambulatory   Physical Exam Constitutional:      Appearance: Normal appearance.  Chest:     Comments: Bilateral breasts inspected.  No palpable masses or regional adenopathy Musculoskeletal:     Cervical back: Normal range of motion and neck supple. No rigidity.  Skin:    General: Skin is warm and dry.  Neurological:     Mental Status: She is alert.        LAB RESULTS:  CMP     Component Value Date/Time   NA 142 04/30/2021 1154   K 4.2 04/30/2021 1154   CL 99 04/30/2021 1154   CO2 27 04/30/2021 1154   GLUCOSE 99 04/30/2021 1154   GLUCOSE 122 (H) 09/11/2020 0830   BUN 11 04/30/2021 1154   CREATININE 0.87 04/30/2021 1154   CREATININE 1.04 (H) 09/11/2020 0830   CREATININE 0.97 03/09/2017 1110   CALCIUM 10.4 (H) 04/30/2021 1154   PROT 7.9 04/30/2021 1154    ALBUMIN 4.9 04/30/2021 1154   AST 37 04/30/2021 1154   AST 49 (H) 09/11/2020 0830   ALT 47 (H) 04/30/2021 1154   ALT 58 (H) 09/11/2020 0830   ALKPHOS 117 04/30/2021 1154   BILITOT 0.4 04/30/2021 1154   BILITOT 0.4 09/11/2020 0830   GFRNONAA >60 09/11/2020 0830   GFRAA 77 06/20/2020 1002    No results found for: "TOTALPROTELP", "ALBUMINELP", "A1GS", "A2GS", "BETS", "BETA2SER", "GAMS", "MSPIKE", "SPEI"  Lab Results  Component Value Date   WBC 9.7 04/30/2021   NEUTROABS 6.5 04/30/2021   HGB 13.7 04/30/2021   HCT 40.2 04/30/2021   MCV 91 04/30/2021   PLT 273 04/30/2021    No results found for: "LABCA2"  No components found for: "WCHENI778"  No results for input(s): "INR" in the last 168 hours.  No results found for: "LABCA2"  No results found for: "EUM353"  No results found for: "CAN125"  No results found for: "CAN153"  No results found for: "CA2729"  No components found for: "HGQUANT"  No results found for: "CEA1", "CEA" / No results found for: "CEA1", "CEA"   No results found for: "AFPTUMOR"  No results found for: "CHROMOGRNA"  No results found for: "KPAFRELGTCHN", "LAMBDASER", "KAPLAMBRATIO" (kappa/lambda light chains)  No results found for: "HGBA", "HGBA2QUANT", "HGBFQUANT", "HGBSQUAN" (Hemoglobinopathy evaluation)   No results found for: "LDH"  Lab Results  Component Value Date   IRON 60 05/03/2019   TIBC 316 05/03/2019   IRONPCTSAT 19 05/03/2019   (  Iron and TIBC)  Lab Results  Component Value Date   FERRITIN 42 05/03/2019    Urinalysis    Component Value Date/Time   BILIRUBINUR neg 03/12/2016 0854   PROTEINUR neg 03/12/2016 0854   UROBILINOGEN negative 03/12/2016 0854   NITRITE neg 03/12/2016 0854   LEUKOCYTESUR Negative 03/12/2016 0854    STUDIES: No results found.   ELIGIBLE FOR AVAILABLE RESEARCH PROTOCOL:no  ASSESSMENT: 56 y.o. [place] woman status post right breast upper outer quadrant biopsy 09/02/2020 for a clinical  T1b N0, stage IA invasive ductal carcinoma, grade 2, estrogen and progesterone receptor positive, HER-2 not amplified, with an MIB-1 of 30%  (1) status post right lumpectomy and sentinel lymph node sampling 10/03/2020 for a pT1c pN0, stage IA invasive ductal carcinoma, grade 2, with negative margins  (a) a single right axillary lymph node was removed  (2) Oncotype score of 25 predicted a risk of recurrence outside the breast in the next 9 years of 12% if the patient's only systemic therapy is antiestrogens for 5 years.  It predicts a less than 1% chemotherapy benefit  (3) adjuvant radiation to be completed 12/06/2020  (4) to start anastrozole 12/31/2020  (a) DEXA scan 04/22/2015  (b) DEXA scan January 2023, normal  PLAN:  Susan Gould is here today for follow-up of her estrogen positive breast cancer.   She has no clinical or radiographic sign of breast cancer recurrence.   She continues on anastrozole daily with manageable hot flashes.   She will RTC in 6 months Encouraged calcium/vitamin D supplementation as tolerated and weightbearing exercises for bone health. Encouraged weight loss, she is participating in a weight loss program and is doing quite well.  Total time spent: 30 minutes  *Total Encounter Time as defined by the Centers for Medicare and Medicaid Services includes, in addition to the face-to-face time of a patient visit (documented in the note above) non-face-to-face time: obtaining and reviewing outside history, ordering and reviewing medications, tests or procedures, care coordination (communications with other health care professionals or caregivers) and documentation in the medical record.

## 2021-12-17 ENCOUNTER — Other Ambulatory Visit: Payer: Self-pay | Admitting: Family Medicine

## 2021-12-17 DIAGNOSIS — I10 Essential (primary) hypertension: Secondary | ICD-10-CM

## 2021-12-22 DIAGNOSIS — F3289 Other specified depressive episodes: Secondary | ICD-10-CM | POA: Diagnosis not present

## 2021-12-22 DIAGNOSIS — F419 Anxiety disorder, unspecified: Secondary | ICD-10-CM | POA: Diagnosis not present

## 2021-12-23 DIAGNOSIS — Z6841 Body Mass Index (BMI) 40.0 and over, adult: Secondary | ICD-10-CM | POA: Diagnosis not present

## 2021-12-23 DIAGNOSIS — Z713 Dietary counseling and surveillance: Secondary | ICD-10-CM | POA: Diagnosis not present

## 2021-12-23 DIAGNOSIS — L720 Epidermal cyst: Secondary | ICD-10-CM | POA: Diagnosis not present

## 2021-12-24 ENCOUNTER — Other Ambulatory Visit: Payer: Self-pay | Admitting: Family Medicine

## 2021-12-24 DIAGNOSIS — M545 Low back pain, unspecified: Secondary | ICD-10-CM

## 2021-12-24 NOTE — Telephone Encounter (Signed)
[facility] is requesting to fill pt diclofenac. Please advise [facility]

## 2021-12-27 HISTORY — PX: RADIOFREQUENCY ABLATION: SHX2290

## 2021-12-31 DIAGNOSIS — Z6841 Body Mass Index (BMI) 40.0 and over, adult: Secondary | ICD-10-CM | POA: Diagnosis not present

## 2021-12-31 DIAGNOSIS — I10 Essential (primary) hypertension: Secondary | ICD-10-CM | POA: Diagnosis not present

## 2021-12-31 DIAGNOSIS — E119 Type 2 diabetes mellitus without complications: Secondary | ICD-10-CM | POA: Diagnosis not present

## 2022-01-02 ENCOUNTER — Encounter: Payer: Self-pay | Admitting: Internal Medicine

## 2022-01-02 ENCOUNTER — Telehealth: Payer: Self-pay | Admitting: Internal Medicine

## 2022-01-02 DIAGNOSIS — F5101 Primary insomnia: Secondary | ICD-10-CM | POA: Diagnosis not present

## 2022-01-02 DIAGNOSIS — F3342 Major depressive disorder, recurrent, in full remission: Secondary | ICD-10-CM | POA: Diagnosis not present

## 2022-01-02 DIAGNOSIS — F411 Generalized anxiety disorder: Secondary | ICD-10-CM | POA: Diagnosis not present

## 2022-01-02 NOTE — Telephone Encounter (Signed)
Called pt She asked for appt to f/u on asthma  Not acutely ill at this time  I scheduled her to [facility] on Tues 01/06/22  Nothing further needed

## 2022-01-02 NOTE — Telephone Encounter (Signed)
Pt called requesting an appt to see MR again. LOV with MR was September 2021. Pt stated that nothing was wrong but that she just wanted to f/u with a pulmonologist for her asthma.  PT still sees CY for OSA and went on to say that he fills her inhaler prescriptions. Pt wants to know if she needs to see a pulmonologist of if CY will continue to refill her inhalers. Pt reiterated that nothing is wrong and that her asthma is under control. She simply wants to make sure she is doing the right thing.  Pt is aware that MR is booked for the next few months and that if we needed to set her up with a new pulm doc, it would likely be with someone new as a "new pt." Please advise if pt needs to be seen by another doc or if CY is willing to see pt for both OSA and asthma.

## 2022-01-02 NOTE — Telephone Encounter (Signed)
Dr. [name] please advise on to the following My Chart message:   Susan [facility] (supporting Deneise Lever, MD) 2 hours ago (10:09 AM)    My psychiatrist has recommended [place] for daytime sleepiness associated with my obstructive sleep apena.  Dr. Ellyn Hack asked that I run this by you to see what you think and if we have your blessing to try this.     Thank you for any advice you might have.   [name]

## 2022-01-04 NOTE — Telephone Encounter (Signed)
Yes- ok for Dr Toy Care, her psychiatrist, to order [facility] as suggested.

## 2022-01-05 DIAGNOSIS — F3289 Other specified depressive episodes: Secondary | ICD-10-CM | POA: Diagnosis not present

## 2022-01-05 DIAGNOSIS — F419 Anxiety disorder, unspecified: Secondary | ICD-10-CM | POA: Diagnosis not present

## 2022-01-06 ENCOUNTER — Ambulatory Visit: Payer: BC Managed Care – PPO | Admitting: Nurse Practitioner

## 2022-01-06 ENCOUNTER — Encounter: Payer: Self-pay | Admitting: Nurse Practitioner

## 2022-01-06 VITALS — BP 110/70 | HR 79 | Ht 64.0 in | Wt 245.7 lb

## 2022-01-06 DIAGNOSIS — J4520 Mild intermittent asthma, uncomplicated: Secondary | ICD-10-CM

## 2022-01-06 DIAGNOSIS — G4733 Obstructive sleep apnea (adult) (pediatric): Secondary | ICD-10-CM

## 2022-01-06 DIAGNOSIS — J4530 Mild persistent asthma, uncomplicated: Secondary | ICD-10-CM

## 2022-01-06 MED ORDER — ALBUTEROL SULFATE HFA 108 (90 BASE) MCG/ACT IN AERS: 2.0000 | INHALATION_SPRAY | Freq: Four times a day (QID) | RESPIRATORY_TRACT | 2 refills | Status: AC | PRN

## 2022-01-06 NOTE — Patient Instructions (Addendum)
Continue Arnuity 1 puff daily. Brush tongue and rinse mouth afterwards Continue Albuterol inhaler 2 puffs every 6 hours as needed for shortness of breath or wheezing. Notify if symptoms persist despite rescue inhaler/neb use. Continue flonase nasal spray 2 sprays each nostril daily Continue Claritin 10 mg daily for allergies  Continue omeprazole 20 mg daily  Continue to use CPAP every night, minimum of 4-6 hours a night.  Change equipment every 30 days or as directed by DME. Wash your tubing with warm soap and water daily, hang to dry. Wash humidifier portion weekly.  Be aware of reduced alertness and do not drive or operate heavy machinery if experiencing this or drowsiness.  Exercise encouraged, as tolerated. Avoid or decrease alcohol consumption and medications that make you more sleepy, if possible. Notify if persistent daytime sleepiness occurs even with consistent use of CPAP.  Follow up in 6 months with Dr. [name]. If symptoms do not improve or worsen, please contact office for sooner follow up or seek emergency care.

## 2022-01-06 NOTE — Progress Notes (Unsigned)
@Patient  ID: Susan Gould, female    DOB: July 16, 1965, 56 y.o.   MRN: [id number]  Chief Complaint  Patient presents with   Follow-up    Pt here in regards to asthma, breathing is okay does have a cough.     Referring provider: Rita Ohara, MD  HPI: 56 year old female, never smoker followed for asthma and OSA.  She is a patient of Dr. Janee Morn and last seen in office 04/29/2021.  She was also previously seen by Dr. Chase Caller for asthma.  Past medical history significant for hypertension, GERD, OA, HLD, obesity, anxiety, history of right breast cancer.  TEST/EVENTS:  04/23/2006 NPSG: AHI 42/h, desat 89% 07/2013 normal spirometry 2016 FeNO 32 ppb   04/29/2021: OV with Dr. [name].  Doing well on CPAP with 100% compliance and residual AHI 0.9/h.  No concerns or complaints at OV.  Breathing has been stable on Arnuity.  01/06/2022: Today-follow-up Patient presents today for follow-up.  She has not seen Dr. Chase Caller for quite some time so just wanted to touch base and make sure that she did not need to make any changes and make sure that everything was okay with her asthma.  She is also curious if she needed to continue to see Dr. Chase Caller or another pulmonary doctor here.  Today, she reports that her breathing has been stable.  She has not required any prednisone or antibiotics in years.  She is using Arnuity twice daily.  She has not used her albuterol in quite some time.  She does not have any issues with her CPAP.  Continues to wear it every night without fail.  Reports sleeping well with it and feeling well rested the next day.  She denies any drowsy driving or morning headaches.  Allergies  Allergen Reactions   Meloxicam Itching and Rash    Immunization History  Administered Date(s) Administered   Influenza Split 04/23/2009, 04/17/2012   Influenza Whole 03/29/2009, 03/17/2010, 04/23/2011   Influenza,inj,Quad PF,6+ Mos 03/07/2013, 02/14/2014, 02/21/2015, 03/03/2016, 03/09/2017,  03/07/2018, 03/22/2019, 03/11/2020, 04/29/2021   Influenza,inj,quad, With Preservative 03/26/2018   Influenza-Unspecified 03/04/2020   PFIZER Comirnaty(Gray Top)Covid-19 Tri-Sucrose Vaccine 10/23/2020   PFIZER(Purple Top)SARS-COV-2 Vaccination 09/15/2019, 10/10/2019, 04/17/2020   Pfizer Covid-19 Vaccine Bivalent Booster 70yr & up 10/29/2021   Pneumococcal Polysaccharide-23 03/07/2013, 04/29/2020   Td 07/30/2004, 08/14/2004   Tdap 07/29/2011, 12/02/2021   Zoster Recombinat (Shingrix) 10/28/2016, 03/09/2017    Past Medical History:  Diagnosis Date   Allergic rhinitis, cause unspecified    cat/dog/ragweed/mold/wool, s/p immunotherapy x 3-4 yrs   Anxiety 2006   Asthma    (Dr. WJoya Gaskins   Breast cancer ([facility] 06/2020   Colon polyps 2007   Dr. EOletta Lamas(she was told repeat in 10 years)   COVID-19 virus infection 12/2020   Depression 2006   Endometriosis    Dr. HLeo Grosser  Family history of breast cancer in female    GERD (gastroesophageal reflux disease)    Heart murmur    Hemorrhoid    internal and external   History of radiation therapy 12/06/2020   right breast 11/07/2020-12/06/2020  Dr JGery Pray  Hyperlipidemia    Hypertension    Insulin resistance    Dr. HLeo Grosser  Medial meniscus tear    Left. Surgery DAmada Jupiter  NOT seen on MRI 06/2013   Obesity    OSA (obstructive sleep apnea)    on CPAP (Dr. CGwenette Greet    Tobacco History: Social History   Tobacco Use  Smoking Status Never  Smokeless Tobacco Never   Counseling given: Not Answered   Outpatient Medications Prior to Visit  Medication Sig Dispense Refill   acetaminophen (TYLENOL) 650 MG CR tablet Take 1,300 mg by mouth every 8 (eight) hours as needed for pain.     ALPRAZolam (XANAX) 0.5 MG tablet TAKE 1/2 -1 TABLET BY MOUTH 3 TIMES DAILY AS NEEDED FOR ANXIETY 20 tablet 0   amLODipine (NORVASC) 5 MG tablet TAKE 1 AND 1/2 TABLETS(7.5 MG) BY MOUTH DAILY 135 tablet 1   anastrozole (ARIMIDEX) 1 MG tablet Take 1 tablet (1  mg total) by mouth daily. Start December 31, 2020 90 tablet 4   ARNUITY ELLIPTA 100 MCG/ACT AEPB INHALE 1 PUFF INTO THE LUNGS DAILY 30 each 3   atorvastatin (LIPITOR) 80 MG tablet TAKE 1 TABLET(80 MG) BY MOUTH DAILY 90 tablet 1   betamethasone dipropionate 0.05 % lotion SMARTSIG:Sparingly Topical 3 Times a Week     CALCIUM PO Take 1,000 mg by mouth daily.     cholecalciferol (VITAMIN D3) 25 MCG (1000 UNIT) tablet Take 1,000 Units by mouth daily.     Desvenlafaxine Succinate (PRISTIQ PO) Take 100 mg by mouth daily.     diclofenac (VOLTAREN) 75 MG EC tablet TAKE 1 TABLET(75 MG) BY MOUTH TWICE DAILY WITH FOOD AS NEEDED FOR MILD PAIN OR MODERATE PAIN 60 tablet 0   fluticasone (FLONASE) 50 MCG/ACT nasal spray INSTILL 1 TO 2 SPRAYS INTO EACH NOSTRIL ONCE A DAY 16 g 11   gabapentin (NEURONTIN) 300 MG capsule TAKE 1 CAPSULE(300 MG) BY MOUTH TWICE DAILY 180 capsule 0   hydrochlorothiazide (MICROZIDE) 12.5 MG capsule TAKE 1 CAPSULE(12.5 MG) BY MOUTH DAILY 90 capsule 1   L-Methylfolate-Algae (DEPLIN 15 PO) Take 1 capsule by mouth daily.     loratadine (CLARITIN) 10 MG tablet Take 10 mg by mouth daily.     losartan (COZAAR) 50 MG tablet Take 1 tablet (50 mg total) by mouth daily. 90 tablet 1   metFORMIN (GLUMETZA) 500 MG (MOD) 24 hr tablet Take 500 mg by mouth daily with breakfast.     Multiple Vitamins-Minerals (ONE-A-DAY MENS 50+ PO) Take 1 tablet by mouth daily.     Omega-3 Fatty Acids (FISH OIL) 1000 MG CAPS Take 2,000 mg by mouth in the morning and at bedtime.     omeprazole (PRILOSEC) 20 MG capsule Take 20 mg by mouth daily.     Semaglutide,0.25 or 0.5MG/DOS, (OZEMPIC, 0.25 OR 0.5 MG/DOSE,) 2 MG/1.5ML SOPN Inject 2 mg into the skin once a week.     tiZANidine (ZANAFLEX) 4 MG tablet Take 4 mg by mouth as needed for muscle spasms.      vitamin B-12 (CYANOCOBALAMIN) 1000 MCG tablet Take 1,000 mcg by mouth daily.     vitamin C (ASCORBIC ACID) 500 MG tablet Take 500 mg by mouth daily.     No  facility-administered medications prior to visit.     Review of Systems:   Constitutional: No weight loss or gain, night sweats, fevers, chills, fatigue, or lassitude. HEENT: No headaches, difficulty swallowing, tooth/dental problems, or sore throat. No sneezing, itching, ear ache, nasal congestion, or post nasal drip CV:  No chest pain, orthopnea, PND, swelling in lower extremities, anasarca, dizziness, palpitations, syncope Resp: No shortness of breath with exertion or at rest. No excess mucus or change in color of mucus. No productive or non-productive. No hemoptysis. No wheezing.  No chest wall deformity Skin: No rash, lesions, ulcerations Neuro: No dizziness or lightheadedness.  Psych: No  depression or anxiety. Mood stable.     Physical Exam:  BP 110/70   Pulse 79   Ht 5' 4"  (1.626 m)   Wt 245 lb 11.2 oz (111.4 kg)   LMP 01/27/2009 (Exact Date)   SpO2 98% Comment: RA  BMI 42.17 kg/m   GEN: Pleasant, interactive, well-appearing; obese; in no acute distress. HEENT:  Normocephalic and atraumatic. PERRLA. Sclera white. Nasal turbinates pink, moist and patent bilaterally. No rhinorrhea present. Oropharynx pink and moist, without exudate or edema. No lesions, ulcerations, or postnasal drip.  NECK:  Supple w/ fair ROM. No JVD present.  CV: RRR, no m/r/g, no peripheral edema. Pulses intact, +2 bilaterally. No cyanosis, pallor or clubbing. PULMONARY:  Unlabored, regular breathing. Clear bilaterally A&P w/o wheezes/rales/rhonchi. No accessory muscle use. No dullness to percussion. GI: BS present and normoactive. Soft, non-tender to palpation.  MSK: No erythema, warmth or tenderness. Cap refil <2 sec all extrem. No deformities or joint swelling noted.  Neuro: A/Ox3. No focal deficits noted.   Skin: Warm, no lesions or rashe Psych: Normal affect and behavior. Judgement and thought content appropriate.     Lab Results:  CBC    Component Value Date/Time   WBC 9.7 04/30/2021 1154    WBC 8.0 09/11/2020 0830   WBC 7.8 07/11/2019 0624   RBC 4.44 04/30/2021 1154   RBC 4.15 09/11/2020 0830   HGB 13.7 04/30/2021 1154   HCT 40.2 04/30/2021 1154   PLT 273 04/30/2021 1154   MCV 91 04/30/2021 1154   MCH 30.9 04/30/2021 1154   MCH 29.9 09/11/2020 0830   MCHC 34.1 04/30/2021 1154   MCHC 32.9 09/11/2020 0830   RDW 13.4 04/30/2021 1154   LYMPHSABS 2.2 04/30/2021 1154   MONOABS 0.5 09/11/2020 0830   EOSABS 0.2 04/30/2021 1154   BASOSABS 0.1 04/30/2021 1154    BMET    Component Value Date/Time   NA 142 04/30/2021 1154   K 4.2 04/30/2021 1154   CL 99 04/30/2021 1154   CO2 27 04/30/2021 1154   GLUCOSE 99 04/30/2021 1154   GLUCOSE 122 (H) 09/11/2020 0830   BUN 11 04/30/2021 1154   CREATININE 0.87 04/30/2021 1154   CREATININE 1.04 (H) 09/11/2020 0830   CREATININE 0.97 03/09/2017 1110   CALCIUM 10.4 (H) 04/30/2021 1154   GFRNONAA >60 09/11/2020 0830   GFRAA 77 06/20/2020 1002    BNP No results found for: "BNP"   Imaging:  No results found.        No data to display          Lab Results  Component Value Date   NITRICOXIDE 34 11/12/2017        Assessment & Plan:   Mild intermittent asthma Compensated on current regimen. She has been doing well on Arnuity for some time now. She does not wish to make any changes today. Will discuss with Dr. [name] if he is ok following her asthma as it is well-controlled or if he wants to refer her to another pulm doc in office. Otherwise, no changes to current regimen. Continue scheduled ICS and PRN albuterol.   Patient Instructions  Continue Arnuity 1 puff daily. Brush tongue and rinse mouth afterwards Continue Albuterol inhaler 2 puffs every 6 hours as needed for shortness of breath or wheezing. Notify if symptoms persist despite rescue inhaler/neb use. Continue flonase nasal spray 2 sprays each nostril daily Continue Claritin 10 mg daily for allergies  Continue omeprazole 20 mg daily  Continue to use CPAP  every night, minimum of 4-6 hours a night.  Change equipment every 30 days or as directed by DME. Wash your tubing with warm soap and water daily, hang to dry. Wash humidifier portion weekly.  Be aware of reduced alertness and do not drive or operate heavy machinery if experiencing this or drowsiness.  Exercise encouraged, as tolerated. Avoid or decrease alcohol consumption and medications that make you more sleepy, if possible. Notify if persistent daytime sleepiness occurs even with consistent use of CPAP.  Follow up in 6 months with Dr. [name]. If symptoms do not improve or worsen, please contact office for sooner follow up or seek emergency care.     OSA (obstructive sleep apnea) Excellent compliance and well-controlled. Continue CPAP 5-15 cmH2O.   I spent 25 minutes of dedicated to the care of this patient on the date of this encounter to include pre-visit review of records, face-to-face time with the patient discussing conditions above, post visit ordering of testing, clinical documentation with the electronic health record, making appropriate referrals as documented, and communicating necessary findings to members of the patients care team.  Clayton Bibles, NP 01/07/2022  Pt aware and understands NP's role.

## 2022-01-07 ENCOUNTER — Encounter: Payer: Self-pay | Admitting: Nurse Practitioner

## 2022-01-07 NOTE — Assessment & Plan Note (Signed)
Compensated on current regimen. She has been doing well on Arnuity for some time now. She does not wish to make any changes today. Will discuss with Dr. [name] if he is ok following her asthma as it is well-controlled or if he wants to refer her to another pulm doc in office. Otherwise, no changes to current regimen. Continue scheduled ICS and PRN albuterol.   Patient Instructions  Continue Arnuity 1 puff daily. Brush tongue and rinse mouth afterwards Continue Albuterol inhaler 2 puffs every 6 hours as needed for shortness of breath or wheezing. Notify if symptoms persist despite rescue inhaler/neb use. Continue flonase nasal spray 2 sprays each nostril daily Continue Claritin 10 mg daily for allergies  Continue omeprazole 20 mg daily  Continue to use CPAP every night, minimum of 4-6 hours a night.  Change equipment every 30 days or as directed by DME. Wash your tubing with warm soap and water daily, hang to dry. Wash humidifier portion weekly.  Be aware of reduced alertness and do not drive or operate heavy machinery if experiencing this or drowsiness.  Exercise encouraged, as tolerated. Avoid or decrease alcohol consumption and medications that make you more sleepy, if possible. Notify if persistent daytime sleepiness occurs even with consistent use of CPAP.  Follow up in 6 months with Dr. [name]. If symptoms do not improve or worsen, please contact office for sooner follow up or seek emergency care.

## 2022-01-07 NOTE — Assessment & Plan Note (Signed)
Excellent compliance and well-controlled. Continue CPAP 5-15 cmH2O.

## 2022-01-08 DIAGNOSIS — M47816 Spondylosis without myelopathy or radiculopathy, lumbar region: Secondary | ICD-10-CM | POA: Diagnosis not present

## 2022-01-12 DIAGNOSIS — F419 Anxiety disorder, unspecified: Secondary | ICD-10-CM | POA: Diagnosis not present

## 2022-01-12 DIAGNOSIS — F3289 Other specified depressive episodes: Secondary | ICD-10-CM | POA: Diagnosis not present

## 2022-01-19 DIAGNOSIS — F419 Anxiety disorder, unspecified: Secondary | ICD-10-CM | POA: Diagnosis not present

## 2022-01-19 DIAGNOSIS — F3289 Other specified depressive episodes: Secondary | ICD-10-CM | POA: Diagnosis not present

## 2022-01-27 DIAGNOSIS — G4733 Obstructive sleep apnea (adult) (pediatric): Secondary | ICD-10-CM | POA: Diagnosis not present

## 2022-01-27 DIAGNOSIS — I10 Essential (primary) hypertension: Secondary | ICD-10-CM | POA: Diagnosis not present

## 2022-01-27 DIAGNOSIS — Z6841 Body Mass Index (BMI) 40.0 and over, adult: Secondary | ICD-10-CM | POA: Diagnosis not present

## 2022-01-27 DIAGNOSIS — E669 Obesity, unspecified: Secondary | ICD-10-CM | POA: Diagnosis not present

## 2022-01-30 ENCOUNTER — Telehealth: Payer: Self-pay | Admitting: Nurse Practitioner

## 2022-01-30 NOTE — Telephone Encounter (Signed)
Fax received from Dr. Edmonia Lynch with [facility] to perform a RIGHT TOTAL KNEE REPLACEMENT with Spinal Anesthetic being used on patient.  Patient needs surgery clearance. Surgery is PENDING. Patient was seen on 01/06/2022. Office protocol is a risk assessment can be sent to surgeon if patient has been seen in 60 days or less.   Sending to Roxan Diesel NP for risk assessment or recommendations if patient needs to be seen in office prior to surgical procedure.

## 2022-02-02 NOTE — Telephone Encounter (Signed)
OV notes and clearance form have been faxed back to [employer]. Nothing further needed at this time.

## 2022-02-02 NOTE — Telephone Encounter (Signed)
Addendum to previous note completed. Cleared from pulmonary standpoint. Please advise patient to take inhalers and CPAP with her to surgery. Thanks.

## 2022-02-02 NOTE — Progress Notes (Signed)
Addendum 02/02/2022: Surgical clearance requested from Dr. Percell Miller for R TKA with spinal anesthesia. Patient was seen by myself in office on 01/06/2022 with excellent control of her asthma and OSA.    Low to moderate risk. Factors that increase the risk for postoperative pulmonary complications are asthma, OSA, obesity.  Respiratory complications generally occur in 1% of ASA Class I patients, 5% of ASA Class II and 10% of ASA Class III-IV patients These complications rarely result in mortality and include postoperative pneumonia, atelectasis, pulmonary embolism, ARDS and increased time requiring postoperative mechanical ventilation.   Overall, I recommend proceeding with the surgery if the risk for respiratory complications are outweighed by the potential benefits. This will need to be discussed between the patient and surgeon.   To reduce risks of respiratory complications, I recommend: --Pre- and post-operative incentive spirometry performed frequently while awake --Inpatient use of currently prescribed positive-pressure for OSA whenever the patient is sleeping --Short duration of surgery as much as possible and avoid paralytic if possible --OOB, encourage mobility post-op   1) RISK FOR PROLONGED MECHANICAL VENTILAION - > 48h  1A) Arozullah - Prolonged mech ventilation risk Arozullah Postperative Pulmonary Risk Score - for mech ventilation dependence >48h [employer], Ann Surg 2000, major non-cardiac surgery) Comment Score  Type of surgery - abd ao aneurysm (27), thoracic (21), neurosurgery / upper abdominal / vascular (21), neck (11) R TKA 0  Emergency Surgery - (11)  0  ALbumin < 3 or poor nutritional state - (9)  0  BUN > 30 -  (8)  0  Partial or completely dependent functional status - (7)  0  COPD -  (6) asthma 6  Age - 60 to 69 (4), > 70  (6) 57 0  TOTAL  6  Risk Stratifcation scores  - < 10 (0.5%), 11-19 (1.8%), 20-27 (4.2%), 28-40 (10.1%), >40 (26.6%)  0.5%

## 2022-02-09 DIAGNOSIS — F419 Anxiety disorder, unspecified: Secondary | ICD-10-CM | POA: Diagnosis not present

## 2022-02-09 DIAGNOSIS — F3289 Other specified depressive episodes: Secondary | ICD-10-CM | POA: Diagnosis not present

## 2022-02-16 DIAGNOSIS — F419 Anxiety disorder, unspecified: Secondary | ICD-10-CM | POA: Diagnosis not present

## 2022-02-16 DIAGNOSIS — F3289 Other specified depressive episodes: Secondary | ICD-10-CM | POA: Diagnosis not present

## 2022-02-23 ENCOUNTER — Ambulatory Visit: Payer: BC Managed Care – PPO | Attending: Surgery

## 2022-02-23 VITALS — Wt 227.5 lb

## 2022-02-23 DIAGNOSIS — M25561 Pain in right knee: Secondary | ICD-10-CM | POA: Diagnosis not present

## 2022-02-23 DIAGNOSIS — Z483 Aftercare following surgery for neoplasm: Secondary | ICD-10-CM | POA: Insufficient documentation

## 2022-02-23 NOTE — Therapy (Signed)
OUTPATIENT PHYSICAL THERAPY SOZO SCREENING NOTE   Patient Name: Susan Gould MRN: [id number] DOB:01/17/1966, 56 y.o., female Today's Date: 02/23/2022  PCP: Knapp, Eve, MD REFERRING PROVIDER: Blackman, Douglas, MD   PT End of Session - 02/23/22 0925     Visit Number 2   # unchanged due to screen only   PT Start Time 0923    PT Stop Time 0933    PT Time Calculation (min) 10 min    Activity Tolerance Patient tolerated treatment well    Behavior During Therapy WFL for tasks assessed/performed             Past Medical History:  Diagnosis Date   Allergic rhinitis, cause unspecified    cat/dog/ragweed/mold/wool, s/p immunotherapy x 3-4 yrs   Anxiety 2006   Asthma    (Dr. Wright)   Breast cancer (HCC) 06/2020   Colon polyps 2007   Dr. Edwards (she was told repeat in 10 years)   COVID-19 virus infection 12/2020   Depression 2006   Endometriosis    Dr. Haygood   Family history of breast cancer in female    GERD (gastroesophageal reflux disease)    Heart murmur    Hemorrhoid    internal and external   History of radiation therapy 12/06/2020   right breast 11/07/2020-12/06/2020  Dr James Kinard   Hyperlipidemia    Hypertension    Insulin resistance    Dr. Haygood   Medial meniscus tear    Left. Surgery Dan Murphy.  NOT seen on MRI 06/2013   Obesity    OSA (obstructive sleep apnea)    on CPAP (Dr. Clance)   Past Surgical History:  Procedure Laterality Date   BREAST LUMPECTOMY WITH SENTINEL LYMPH NODE BIOPSY Right 10/03/2020   Procedure: RIGHT BREAST LUMPECTOMY WITH SENTINEL LYMPH NODE BIOPSY;  Surgeon: Blackman, Douglas, MD;  Location: [facility];  Service: General;  Laterality: Right;   CARPAL TUNNEL RELEASE Right 12/28/2019   Dr. Murphy   CARPAL TUNNEL RELEASE Left 02/22/2020   CHOLECYSTECTOMY  1986   EYE SURGERY  2000   FINGER GANGLION CYST EXCISION  2003   RIGHT 3rd finger   JOINT REPLACEMENT  2017   KNEE ARTHROSCOPY Right 04/17/2021   Dr.  Tim Murphy (torn meniscus)   KNEE SURGERY  '95 and '97   BILATERAL KNEE   LAPAROSCOPY  '98, '01   endometriosis (Dr. Haygood)   LASIK  2000   tibial tuberoplasty  90's   left   Patient Active Problem List   Diagnosis Date Noted   Malignant neoplasm of upper-outer quadrant of right breast in female, estrogen receptor positive (HCC) 09/09/2020   Steatohepatitis 07/25/2019   Acute exacerbation of moderate persistent extrinsic asthma 06/02/2016   Class 3 obesity due to excess calories with serious comorbidity and body mass index (BMI) of 45.0 to 49.9 in adult 04/23/2016   Pre-operative clearance 09/23/2015   Mixed hyperlipidemia 07/29/2011   OA (osteoarthritis) of knee 07/29/2011   Morbid obesity with body mass index of 45.0-49.9 in adult (HCC) 03/17/2010   Insulin resistance syndrome 11/13/2009   HYPERLIPIDEMIA 11/13/2009   Anxiety state 11/13/2009   Essential hypertension 11/13/2009   Mild intermittent asthma 11/13/2009   GERD 11/13/2009   ENDOMETRIOSIS 11/13/2009   OSA (obstructive sleep apnea) 11/13/2009    REFERRING DIAG: right breast cancer at risk for lymphedema  THERAPY DIAG:  Aftercare following surgery for neoplasm  PERTINENT HISTORY: Patient was diagnosed on 09/02/2020 with right grade II invasive   ductal carcinoma breast cancer. She underwent a right lumpectomy and sentinel node biopsy (1 negative) on 10/03/2020. It is ER/PR positive and HER2 negative with a Ki67 of 30%.   PRECAUTIONS: right UE Lymphedema risk, None  SUBJECTIVE: Pt returns for her 3 month L-Dex screen. "I've lost 35# lbs and feel good! I'm having a TKR soon since I've lost the weight."  PAIN:  Are you having pain? No  SOZO SCREENING: Patient was assessed today using the SOZO machine to determine the lymphedema index score. This was compared to her baseline score. It was determined that she is within the recommended range when compared to her baseline and no further action is needed at this time. She  will continue SOZO screenings. These are done every 3 months for 2 years post operatively followed by every 6 months for 2 years, and then annually.   L-DEX FLOWSHEETS - 02/23/22 0900       L-DEX LYMPHEDEMA SCREENING   Measurement Type Unilateral    L-DEX MEASUREMENT EXTREMITY Upper Extremity    POSITION  Standing    DOMINANT SIDE Right    At Risk Side Right    BASELINE SCORE (UNILATERAL) -1.2    L-DEX SCORE (UNILATERAL) -0.8    VALUE CHANGE (UNILAT) 0.4              Otelia Limes, PTA 02/23/2022, 9:34 AM

## 2022-02-23 NOTE — H&P (Signed)
KNEE ARTHROPLASTY ADMISSION H&P  Patient ID: Susan Gould MRN: [id number] DOB/AGE: 03/09/1966 56 y.o.  Chief Complaint: right knee pain.  Planned Procedure Date: 03/17/22 Medical and Cardiac Clearance by Dr. Rita Ohara   Pulmonary clearance by Marland Kitchen NP   HPI: Susan Gould is a 56 y.o. 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 The patient has a history of pain and functional disability in the right knee due to arthritis and has failed non-surgical conservative treatments for greater than 12 weeks to include NSAID's and/or analgesics, corticosteriod injections, viscosupplementation injections, supervised PT with diminished ADL's post treatment, use of assistive devices, weight reduction as appropriate, and activity modification.  Onset of symptoms was gradual, starting 1 years ago with gradually worsening course since that time. The patient noted prior procedures on the knee to include  arthroscopy and menisectomy on the right knee.  Patient currently rates pain at 4 out of 10 with activity. Patient has night pain, worsening of pain with activity and weight bearing, and pain that interferes with activities of daily living.  Patient has evidence of subchondral sclerosis, periarticular osteophytes, and joint space narrowing by imaging studies.  There is no active infection.  Past Medical History:  Diagnosis Date   Allergic rhinitis, cause unspecified    cat/dog/ragweed/mold/wool, s/p immunotherapy x 3-4 yrs   Anxiety 2006   Asthma    (Dr. Joya Gaskins)   Breast cancer [facility]) 06/2020   Colon polyps 2007   Dr. Oletta Lamas (she was told repeat in 10 years)   COVID-19 virus infection 12/2020   Depression 2006   Endometriosis    Dr. Leo Grosser   Family history of breast cancer in female    GERD (gastroesophageal reflux disease)    Heart murmur    Hemorrhoid    internal and external   History of radiation therapy 12/06/2020   right breast 11/07/2020-12/06/2020  Dr Gery Pray    Hyperlipidemia    Hypertension    Insulin resistance    Dr. Leo Grosser   Medial meniscus tear    Left. Surgery [name].  NOT seen on MRI 06/2013   Obesity    OSA (obstructive sleep apnea)    on CPAP (Dr. Gwenette Greet)   Past Surgical History:  Procedure Laterality Date   BREAST LUMPECTOMY WITH SENTINEL LYMPH NODE BIOPSY Right 10/03/2020   Procedure: RIGHT BREAST LUMPECTOMY WITH SENTINEL LYMPH NODE BIOPSY;  Surgeon: [name], MD;  Location: [place];  Service: General;  Laterality: Right;   CARPAL TUNNEL RELEASE Right 12/28/2019   Dr. Percell Miller   CARPAL TUNNEL RELEASE Left 02/22/2020   [place] CYST EXCISION  2003   RIGHT 3rd finger   JOINT REPLACEMENT  2017   KNEE ARTHROSCOPY Right 04/17/2021   Dr. Fredonia Highland (torn meniscus)   KNEE SURGERY  '95 and '97   BILATERAL KNEE   LAPAROSCOPY  '98, '01   endometriosis (Dr. Leo Grosser)   LASIK  2000   tibial tuberoplasty  90's   left   Allergies  Allergen Reactions   Meloxicam Itching and Rash   Prior to Admission medications   Medication Sig Start Date End Date Taking? Authorizing Provider  acetaminophen (TYLENOL) 650 MG CR tablet Take 1,300 mg by mouth every 8 (eight) hours as needed for pain.    [provider]  albuterol (VENTOLIN HFA) 108 (90 Base) MCG/ACT inhaler Inhale 2 puffs into the lungs every 6 (six)  hours as needed for wheezing or shortness of breath. 01/06/22   Cobb, Karie Schwalbe, NP  ALPRAZolam (XANAX) 0.5 MG tablet TAKE 1/2 -1 TABLET BY MOUTH 3 TIMES DAILY AS NEEDED FOR ANXIETY 12/15/18   Rita Ohara, MD  amLODipine (NORVASC) 5 MG tablet TAKE 1 AND 1/2 TABLETS(7.5 MG) BY MOUTH DAILY 10/29/21   Rita Ohara, MD  anastrozole (ARIMIDEX) 1 MG tablet Take 1 tablet (1 mg total) by mouth daily. Start December 31, 2020 10/23/21   Benay Pike, MD  ARNUITY ELLIPTA 100 MCG/ACT AEPB INHALE 1 PUFF INTO THE LUNGS DAILY 06/16/21   Baird Lyons D, MD  atorvastatin  (LIPITOR) 80 MG tablet TAKE 1 TABLET(80 MG) BY MOUTH DAILY 10/29/21   Rita Ohara, MD  betamethasone dipropionate 0.05 % lotion SMARTSIG:Sparingly Topical 3 Times a Week 03/21/21   [provider]  CALCIUM PO Take 1,000 mg by mouth daily.    [provider]  cholecalciferol (VITAMIN D3) 25 MCG (1000 UNIT) tablet Take 1,000 Units by mouth daily.    [provider]  Desvenlafaxine Succinate (PRISTIQ PO) Take 100 mg by mouth daily.    [provider]  diclofenac (VOLTAREN) 75 MG EC tablet TAKE 1 TABLET(75 MG) BY MOUTH TWICE DAILY WITH FOOD AS NEEDED FOR MILD PAIN OR MODERATE PAIN 12/24/21   Rita Ohara, MD  fluticasone [facility]) 50 MCG/ACT nasal spray INSTILL 1 TO 2 SPRAYS INTO EACH NOSTRIL ONCE A DAY 03/07/13   Elsie Stain, MD  gabapentin (NEURONTIN) 300 MG capsule TAKE 1 CAPSULE(300 MG) BY MOUTH TWICE DAILY 10/20/21   Rita Ohara, MD  hydrochlorothiazide (MICROZIDE) 12.5 MG capsule TAKE 1 CAPSULE(12.5 MG) BY MOUTH DAILY 12/17/21   Rita Ohara, MD  L-Methylfolate-Algae (DEPLIN 15 PO) Take 1 capsule by mouth daily.    [provider]  loratadine (CLARITIN) 10 MG tablet Take 10 mg by mouth daily.    [provider]  losartan (COZAAR) 50 MG tablet Take 1 tablet (50 mg total) by mouth daily. 10/29/21   Rita Ohara, MD  metFORMIN (GLUMETZA) 500 MG (MOD) 24 hr tablet Take 500 mg by mouth daily with breakfast.    [provider]  Multiple Vitamins-Minerals (ONE-A-DAY MENS 50+ PO) Take 1 tablet by mouth daily.    [provider]  Omega-3 Fatty Acids (FISH OIL) 1000 MG CAPS Take 2,000 mg by mouth in the morning and at bedtime.    [provider]  omeprazole (PRILOSEC) 20 MG capsule Take 20 mg by mouth daily.    [provider]  Semaglutide,0.25 or 0.5MG/DOS, (OZEMPIC, 0.25 OR 0.5 MG/DOSE,) 2 MG/1.5ML SOPN Inject 2 mg into the skin once a week. 01/01/22   [provider]  tiZANidine (ZANAFLEX) 4 MG tablet Take 4 mg by mouth  as needed for muscle spasms.     [provider]  vitamin B-12 (CYANOCOBALAMIN) 1000 MCG tablet Take 1,000 mcg by mouth daily.    [provider]  vitamin C (ASCORBIC ACID) 500 MG tablet Take 500 mg by mouth daily.    [provider]   Social History   Socioeconomic History   Marital status: Single    Spouse name: Not on file   Number of children: 0   Years of education: Not on file   Highest education level: Not on file  Occupational History   Occupation: Medical Transcription    Employer: [employer]  Tobacco Use   Smoking status: Never   Smokeless tobacco: Never  Vaping Use  Vaping Use: Never used  Substance and Sexual Activity   Alcohol use: Yes    Alcohol/week: 0.0 standard drinks of alcohol    Comment: 1-2 drinks a month   Drug use: No   Sexual activity: Not Currently    Birth control/protection: Abstinence  Other Topics Concern   Not on file  Social History Narrative   Lives alone, 1 cat (1 passed away 04-14-2019 at the age of 32).  Sister and father live in [place] Strain: Low Risk  (09/11/2020)   Overall Financial Resource Strain (CARDIA)    Difficulty of Paying Living Expenses: Not hard at all  Food Insecurity: No Food Insecurity (09/11/2020)   Hunger Vital Sign    Worried About Running Out of Food in the Last Year: Never true    [place] in the Last Year: Never true  Transportation Needs: No Transportation Needs (09/11/2020)   PRAPARE - Hydrologist (Medical): No    Lack of Transportation (Non-Medical): No  Physical Activity: Not on file  Stress: Not on file  Social Connections: Not on file   Family History  Problem Relation Age of Onset   Diabetes Mother    Hypertension Mother    Hyperlipidemia Mother    Arthritis Mother    Cancer Mother 64       breast cancer   Breast cancer Mother 77   Diabetes Father    Hyperlipidemia Father     Hypertension Father    Asthma Father    Arthritis Father    Heart disease Father        CHF, hypokinesis   COPD Father        due to asbestos exposure; former smoker   Pulmonary embolism Father    Parkinson's disease Father    Hypertension Sister    Colon cancer Maternal Grandmother        late 60's   Cancer Maternal Grandmother        colon   Heart disease Maternal Grandmother        MI in 64's   Breast cancer Paternal Aunt        70's   Cancer Paternal Aunt        breast   Heart disease Paternal Uncle    Diabetes Paternal Uncle    Kidney disease Paternal Uncle     ROS: Currently denies lightheadedness, dizziness, Fever, chills, CP, SOB.   No personal history of DVT, PE, MI, or CVA. No loose teeth or dentures All other systems have been reviewed and were otherwise currently negative with the exception of those mentioned in the HPI and as above.  Objective: Vitals: Ht: 5'4" Wt: 228.4 lbs Temp: 98.1 BP: 142/80 Pulse: 81 O2 93% on room air.   Physical Exam: General: Alert, NAD.  Antalgic Gait  HEENT: EOMI, Good Neck Extension  Pulm: No increased work of breathing.  Clear B/L A/P w/o crackle or wheeze.  CV: RRR, No m/g/r appreciated  GI: soft, NT, ND. BS x 4 quadrants Neuro: CN II-XII grossly intact without focal deficit.  Sensation intact distally Skin: No lesions in the area of chief complaint MSK/Surgical Site:   + JLT. ROM 10-95 degrees. Decreased strength in extension and flexion.  +EHL/FHL.  NVI.  Pain and instability with varus and valgus stress.    Imaging Review Plain radiographs demonstrate severe degenerative joint disease of the right knee.   The  overall alignment ismild varus. The bone quality appears to be fair for age and reported activity level.  Preoperative templating of the joint replacement has been completed, documented, and submitted to the Operating Room personnel in order to optimize intra-operative equipment management.  Assessment: OA RIGHT  KNEE Active Problems:   * No active hospital problems. *   Plan: Plan for Procedure(s): TOTAL KNEE ARTHROPLASTY  The patient history, physical exam, clinical judgement of the provider and imaging are consistent with end stage degenerative joint disease and total joint arthroplasty is deemed medically necessary. The treatment options including medical management, injection therapy, and arthroplasty were discussed at length. The risks and benefits of Procedure(s): TOTAL KNEE ARTHROPLASTY were presented and reviewed.  The risks of nonoperative treatment, versus surgical intervention including but not limited to continued pain, aseptic loosening, stiffness, dislocation/subluxation, infection, bleeding, nerve injury, blood clots, cardiopulmonary complications, morbidity, mortality, among others were discussed. The patient verbalizes understanding and wishes to proceed with the plan.  Patient is being admitted for inpatient treatment for surgery, pain control, PT, prophylactic antibiotics, VTE prophylaxis, progressive ambulation, ADL's and discharge planning.   Dental prophylaxis discussed and recommended for 2 years postoperatively.  The patient does meet the criteria for TXA which will be used perioperatively.   ASA 81 mg BID will be used postoperatively for DVT prophylaxis in addition to SCDs, and early ambulation. Plan for Tylenol, Diclofenac, Oxycodone for pain.   Robaxin for muscle spasms.   Zofran for nausea and vomiting. Miralax for constipation prevention Pharmacy- [facility] [address] [place] [facility] [employer] The patient is planning to be discharged home with OPPT and into the care of her sister Lattie Haw who can be reached at [phone] Follow up appt 04/01/22 at 4:15pm    Alisa Graff Office [phone] 02/23/2022 5:44 PM

## 2022-02-24 DIAGNOSIS — F3289 Other specified depressive episodes: Secondary | ICD-10-CM | POA: Diagnosis not present

## 2022-02-24 DIAGNOSIS — F419 Anxiety disorder, unspecified: Secondary | ICD-10-CM | POA: Diagnosis not present

## 2022-02-24 DIAGNOSIS — Z713 Dietary counseling and surveillance: Secondary | ICD-10-CM | POA: Diagnosis not present

## 2022-03-03 DIAGNOSIS — F3289 Other specified depressive episodes: Secondary | ICD-10-CM | POA: Diagnosis not present

## 2022-03-03 DIAGNOSIS — F419 Anxiety disorder, unspecified: Secondary | ICD-10-CM | POA: Diagnosis not present

## 2022-03-04 ENCOUNTER — Other Ambulatory Visit: Payer: Self-pay | Admitting: Family Medicine

## 2022-03-04 ENCOUNTER — Encounter: Payer: Self-pay | Admitting: Internal Medicine

## 2022-03-04 DIAGNOSIS — G8929 Other chronic pain: Secondary | ICD-10-CM

## 2022-03-04 NOTE — Telephone Encounter (Signed)
Does she need it? I think she is having knee surgery in a week or two, ?if getting any meds from ortho?

## 2022-03-04 NOTE — Telephone Encounter (Signed)
Is this okay to refill? 

## 2022-03-05 NOTE — Telephone Encounter (Signed)
Spoke with patient and she was told by ortho to continue on and off as needed. She was only given #21 hydrocodone in July but that rx is now gone. Needs refilled.

## 2022-03-05 NOTE — Patient Instructions (Addendum)
SURGICAL WAITING ROOM VISITATION Patients having surgery or a procedure may have no more than 2 support people in the waiting area - these visitors may rotate.   Children under the age of 51 must have an adult with them who is not the patient. If the patient needs to stay at the hospital during part of their recovery, the visitor guidelines for inpatient rooms apply. Pre-op nurse will coordinate an appropriate time for 1 support person to accompany patient in pre-op.  This support person may not rotate.    Please refer to the [facility] website for the visitor guidelines for Inpatients (after your surgery is over and you are in a regular room).    Your procedure is scheduled on: 03/17/22   Report to [facility] Main Entrance    Report to admitting at 7:30 AM   Call this number if you have problems the morning of surgery [phone]   Do not eat food :After Midnight.   After Midnight you may have the following liquids until 7:00 AM DAY OF SURGERY  Water Non-Citrus Juices (without pulp, NO RED) Carbonated Beverages Black Coffee (NO MILK/CREAM OR CREAMERS, sugar ok)  Clear Tea (NO MILK/CREAM OR CREAMERS, sugar ok) regular and decaf                             Plain Jell-O (NO RED)                                           Fruit ices (not with fruit pulp, NO RED)                                     Popsicles (NO RED)                                                               Sports drinks like Gatorade (NO RED)     The day of surgery:  Drink ONE (1) Pre-Surgery G2 at 7:00 AM the morning of surgery. Drink in one sitting. Do not sip.  This drink was given to you during your hospital  pre-op appointment visit. Nothing else to drink after completing the  Pre-Surgery G2.          If you have questions, please contact your surgeon's office.   FOLLOW BOWEL PREP AND ANY ADDITIONAL PRE OP INSTRUCTIONS YOU RECEIVED FROM YOUR SURGEON'S OFFICE!!!     Oral Hygiene is also  important to reduce your risk of infection.                                    Remember - BRUSH YOUR TEETH THE MORNING OF SURGERY WITH YOUR REGULAR TOOTHPASTE   Take these medicines the morning of surgery with A SIP OF WATER: Tylenol, Inhalers, Xanax, Amlodipine, Atorvastatin, Gabapentin, Omeprazole   DO NOT TAKE ANY ORAL DIABETIC MEDICATIONS DAY OF YOUR SURGERY  How to Manage Your Diabetes Before and After Surgery  Why is it  important to control my blood sugar before and after surgery? Improving blood sugar levels before and after surgery helps healing and can limit problems. A way of improving blood sugar control is eating a healthy diet by:  Eating less sugar and carbohydrates  Increasing activity/exercise  Talking with your doctor about reaching your blood sugar goals High blood sugars (greater than 180 mg/dL) can raise your risk of infections and slow your recovery, so you will need to focus on controlling your diabetes during the weeks before surgery. Make sure that the doctor who takes care of your diabetes knows about your planned surgery including the date and location.  How do I manage my blood sugar before surgery? Check your blood sugar at least 4 times a day, starting 2 days before surgery, to make sure that the level is not too high or low. Check your blood sugar the morning of your surgery when you wake up and every 2 hours until you get to the Short Stay unit. If your blood sugar is less than 70 mg/dL, you will need to treat for low blood sugar: Do not take insulin. Treat a low blood sugar (less than 70 mg/dL) with  cup of clear juice (cranberry or apple), 4 glucose tablets, OR glucose gel. Recheck blood sugar in 15 minutes after treatment (to make sure it is greater than 70 mg/dL). If your blood sugar is not greater than 70 mg/dL on recheck, call [phone] for further instructions. Report your blood sugar to the short stay nurse when you get to Short Stay.  If you are  admitted to the hospital after surgery: Your blood sugar will be checked by the staff and you will probably be given insulin after surgery (instead of oral diabetes medicines) to make sure you have good blood sugar levels. The goal for blood sugar control after surgery is 80-180 mg/dL.   WHAT DO I DO ABOUT MY DIABETES MEDICATION?  Do not take oral diabetes medicines (pills) the morning of surgery.  DO NOT take Ozempic 03/12/22, may continue the following week  THE DAY BEFORE SURGERY, take Metformin as prescribed.     THE MORNING OF SURGERY, do not take Metformin.   The day of surgery, do not take other diabetes injectables, including Byetta (exenatide), Bydureon (exenatide ER), Victoza (liraglutide), or Trulicity (dulaglutide).  If your CBG is greater than 220 mg/dL, you may take  of your sliding scale  (correction) dose of insulin.  Reviewed and Endorsed by [facility] Patient Education Committee, August 2015   Bring CPAP mask and tubing day of surgery.                              You may not have any metal on your body including hair pins, jewelry, and body piercing             Do not wear make-up, lotions, powders, perfumes, or deodorant  Do not wear nail polish including gel and S&S, artificial/acrylic nails, or any other type of covering on natural nails including finger and toenails. If you have artificial nails, gel coating, etc. that needs to be removed by a nail salon please have this removed prior to surgery or surgery may need to be canceled/ delayed if the surgeon/ anesthesia feels like they are unable to be safely monitored.   Do not shave  48 hours prior to surgery.    Do not bring valuables to the hospital. [facility] IS NOT             RESPONSIBLE   FOR VALUABLES.  DO NOT [place]. PHARMACY WILL DISPENSE MEDICATIONS LISTED ON YOUR MEDICATION LIST TO YOU DURING YOUR ADMISSION [place]!    Patients discharged on the day  of surgery will not be allowed to drive home.  Someone NEEDS to stay with you for the first 24 hours after anesthesia.   Special Instructions: Bring a copy of your healthcare power of attorney and living will documents         the day of surgery if you haven't scanned them before.              Please read over the following fact sheets you were given: IF YOU HAVE QUESTIONS ABOUT YOUR PRE-OP INSTRUCTIONS PLEASE CALL [facility] - Preparing for Surgery Before surgery, you can play an important role.  Because skin is not sterile, your skin needs to be as free of germs as possible.  You can reduce the number of germs on your skin by washing with CHG (chlorahexidine gluconate) soap before surgery.  CHG is an antiseptic cleaner which kills germs and bonds with the skin to continue killing germs even after washing. Please DO NOT use if you have an allergy to CHG or antibacterial soaps.  If your skin becomes reddened/irritated stop using the CHG and inform your nurse when you arrive at Short Stay. Do not shave (including legs and underarms) for at least 48 hours prior to the first CHG shower.  You may shave your face/neck.  Please follow these instructions carefully:  1.  Shower with CHG Soap the night before surgery and the  morning of surgery.  2.  If you choose to wash your hair, wash your hair first as usual with your normal  shampoo.  3.  After you shampoo, rinse your hair and body thoroughly to remove the shampoo.                             4.  Use CHG as you would any other liquid soap.  You can apply chg directly to the skin and wash.  Gently with a scrungie or clean washcloth.  5.  Apply the CHG Soap to your body ONLY FROM THE NECK DOWN.   Do   not use on face/ open                           Wound or open sores. Avoid contact with eyes, ears mouth and   genitals (private parts).                       Wash face,  Genitals (private parts) with your normal soap.              6.  Wash thoroughly, paying special attention to the area where your    surgery  will be performed.  7.  Thoroughly rinse your body with warm water from the neck down.  8.  DO NOT shower/wash with your normal soap after using and rinsing off the CHG Soap.                9.  Pat yourself dry with a clean towel.  10.  Wear clean pajamas.            11.  Place clean sheets on your bed the night of your first shower and do not  sleep with pets. Day of Surgery : Do not apply any lotions/deodorants the morning of surgery.  Please wear clean clothes to the hospital/surgery center.  FAILURE TO FOLLOW THESE INSTRUCTIONS MAY RESULT IN THE CANCELLATION OF YOUR SURGERY  PATIENT SIGNATURE_________________________________  NURSE SIGNATURE__________________________________  ________________________________________________________________________   Adam Phenix  An incentive spirometer is a tool that can help keep your lungs clear and active. This tool measures how well you are filling your lungs with each breath. Taking long deep breaths may help reverse or decrease the chance of developing breathing (pulmonary) problems (especially infection) following: A long period of time when you are unable to move or be active. BEFORE THE PROCEDURE  If the spirometer includes an indicator to show your best effort, your nurse or respiratory therapist will set it to a desired goal. If possible, sit up straight or lean slightly forward. Try not to slouch. Hold the incentive spirometer in an upright position. INSTRUCTIONS FOR USE  Sit on the edge of your bed if possible, or sit up as far as you can in bed or on a chair. Hold the incentive spirometer in an upright position. Breathe out normally. Place the mouthpiece in your mouth and seal your lips tightly around it. Breathe in slowly and as deeply as possible, raising the piston or the ball toward the top of the column. Hold your breath for 3-5  seconds or for as long as possible. Allow the piston or ball to fall to the bottom of the column. Remove the mouthpiece from your mouth and breathe out normally. Rest for a few seconds and repeat Steps 1 through 7 at least 10 times every 1-2 hours when you are awake. Take your time and take a few normal breaths between deep breaths. The spirometer may include an indicator to show your best effort. Use the indicator as a goal to work toward during each repetition. After each set of 10 deep breaths, practice coughing to be sure your lungs are clear. If you have an incision (the cut made at the time of surgery), support your incision when coughing by placing a pillow or rolled up towels firmly against it. Once you are able to get out of bed, walk around indoors and cough well. You may stop using the incentive spirometer when instructed by your caregiver.  RISKS AND COMPLICATIONS Take your time so you do not get dizzy or light-headed. If you are in pain, you may need to take or ask for pain medication before doing incentive spirometry. It is harder to take a deep breath if you are having pain. AFTER USE Rest and breathe slowly and easily. It can be helpful to keep track of a log of your progress. Your caregiver can provide you with a simple table to help with this. If you are using the spirometer at home, follow these instructions: [place] IF:  You are having difficultly using the spirometer. You have trouble using the spirometer as often as instructed. Your pain medication is not giving enough relief while using the spirometer. You develop fever of 100.5 F (38.1 C) or higher. SEEK IMMEDIATE MEDICAL CARE IF:  You cough up bloody sputum that had not been present before. You develop fever of 102 F (38.9 C) or greater. You develop worsening pain at or  near the incision site. MAKE SURE YOU:  Understand these instructions. Will watch your condition. Will get help right away if you are  not doing well or get worse. Document Released: 10/26/2006 Document Revised: 09/07/2011 Document Reviewed: 12/27/2006 [facility] Patient Information 2014 [place], [place].   ________________________________________________________________________

## 2022-03-05 NOTE — Progress Notes (Addendum)
COVID Vaccine Completed: yes x4  Date of COVID positive in last 90 days: no  PCP - Rita Ohara, MD Cardiologist - n/a  Medical clearance by Marland Kitchen 01/06/22 in Epic  Chest x-ray - n/a EKG - 03/06/22 Epic/chart Stress Test - n/a ECHO - years ago per pt Cardiac Cath - n/a Pacemaker/ICD device last checked: n/a Spinal Cord Stimulator: n/a  Bowel Prep - no  Sleep Study - yes, positive CPAP -  yes every night   Fasting Blood Sugar - insulin resistance, no checks at home Checks Blood Sugar _____ times a day  Blood Thinner Instructions: n/a Aspirin Instructions: Last Dose:  Activity level: Can go up a flight of stairs and perform activities of daily living without stopping and without symptoms of chest pain or shortness of breath.   Anesthesia review:   Patient denies shortness of breath, fever, cough and chest pain at PAT appointment  Patient verbalized understanding of instructions that were given to them at the PAT appointment. Patient was also instructed that they will need to review over the PAT instructions again at home before surgery.

## 2022-03-06 ENCOUNTER — Encounter (HOSPITAL_COMMUNITY)
Admission: RE | Admit: 2022-03-06 | Discharge: 2022-03-06 | Disposition: A | Discharge status: Home / Self Care | Payer: BC Managed Care – PPO | Source: Ambulatory Visit | Attending: Orthopedic Surgery | Admitting: Orthopedic Surgery

## 2022-03-06 ENCOUNTER — Encounter (HOSPITAL_COMMUNITY): Payer: Self-pay

## 2022-03-06 VITALS — BP 144/83 | HR 86 | Temp 98.3°F | Resp 16 | Ht 64.0 in | Wt 222.4 lb

## 2022-03-06 DIAGNOSIS — E119 Type 2 diabetes mellitus without complications: Secondary | ICD-10-CM | POA: Diagnosis not present

## 2022-03-06 DIAGNOSIS — Z01818 Encounter for other preprocedural examination: Secondary | ICD-10-CM | POA: Insufficient documentation

## 2022-03-06 DIAGNOSIS — I10 Essential (primary) hypertension: Secondary | ICD-10-CM

## 2022-03-06 HISTORY — DX: Other intervertebral disc degeneration, lumbosacral region without mention of lumbar back pain or lower extremity pain: M51.379

## 2022-03-06 HISTORY — DX: Anemia, unspecified: D64.9

## 2022-03-06 HISTORY — DX: Other intervertebral disc degeneration, lumbosacral region: M51.37

## 2022-03-06 HISTORY — DX: Fatty (change of) liver, not elsewhere classified: K76.0

## 2022-03-06 LAB — BASIC METABOLIC PANEL
Anion gap: 9 (ref 5–15)
BUN: 27 mg/dL — ABNORMAL HIGH (ref 6–20)
CO2: 25 mmol/L (ref 22–32)
Calcium: 10.3 mg/dL (ref 8.9–10.3)
Chloride: 105 mmol/L (ref 98–111)
Creatinine, Ser: 0.95 mg/dL (ref 0.44–1.00)
GFR, Estimated: >60 mL/min (ref >60)
Glucose, Bld: 99 mg/dL (ref 70–99)
Potassium: 3.9 mmol/L (ref 3.5–5.1)
Sodium: 139 mmol/L (ref 135–145)

## 2022-03-06 LAB — HEMOGLOBIN A1C
Hgb A1c MFr Bld: 5.5 % (ref 4.8–5.6)
Mean Plasma Glucose: 111.15 mg/dL

## 2022-03-06 LAB — CBC
HCT: 41.1 % (ref 36.0–46.0)
Hemoglobin: 13.4 g/dL (ref 12.0–15.0)
MCH: 30.3 pg (ref 26.0–34.0)
MCHC: 32.6 g/dL (ref 30.0–36.0)
MCV: 93 fL (ref 80.0–100.0)
Platelets: 253 10*3/uL (ref 150–400)
RBC: 4.42 MIL/uL (ref 3.87–5.11)
RDW: 12.5 % (ref 11.5–15.5)
WBC: 6.5 10*3/uL (ref 4.0–10.5)
nRBC: 0 % (ref 0.0–0.2)

## 2022-03-06 LAB — SURGICAL PCR SCREEN
MRSA, PCR: NEGATIVE
Staphylococcus aureus: NEGATIVE

## 2022-03-06 LAB — GLUCOSE, CAPILLARY: Glucose-Capillary: 101 mg/dL — ABNORMAL HIGH (ref 70–99)

## 2022-03-09 DIAGNOSIS — F3289 Other specified depressive episodes: Secondary | ICD-10-CM | POA: Diagnosis not present

## 2022-03-09 DIAGNOSIS — F419 Anxiety disorder, unspecified: Secondary | ICD-10-CM | POA: Diagnosis not present

## 2022-03-16 DIAGNOSIS — F419 Anxiety disorder, unspecified: Secondary | ICD-10-CM | POA: Diagnosis not present

## 2022-03-16 DIAGNOSIS — F3289 Other specified depressive episodes: Secondary | ICD-10-CM | POA: Diagnosis not present

## 2022-03-17 ENCOUNTER — Other Ambulatory Visit: Payer: Self-pay

## 2022-03-17 ENCOUNTER — Ambulatory Visit (HOSPITAL_COMMUNITY): Payer: BC Managed Care – PPO | Admitting: Certified Registered"

## 2022-03-17 ENCOUNTER — Ambulatory Visit (HOSPITAL_COMMUNITY): Payer: BC Managed Care – PPO

## 2022-03-17 ENCOUNTER — Ambulatory Visit (HOSPITAL_COMMUNITY)
Admission: RE | Admit: 2022-03-17 | Discharge: 2022-03-17 | Disposition: A | Discharge status: Home / Self Care | Payer: BC Managed Care – PPO | Attending: Orthopedic Surgery | Admitting: Orthopedic Surgery

## 2022-03-17 ENCOUNTER — Encounter (HOSPITAL_COMMUNITY)
Admission: RE | Disposition: A | Discharge status: Home / Self Care | Payer: Self-pay | Source: Home / Self Care | Attending: Orthopedic Surgery

## 2022-03-17 ENCOUNTER — Encounter (HOSPITAL_COMMUNITY): Payer: Self-pay | Admitting: Orthopedic Surgery

## 2022-03-17 DIAGNOSIS — Z9889 Other specified postprocedural states: Secondary | ICD-10-CM | POA: Diagnosis not present

## 2022-03-17 DIAGNOSIS — G8918 Other acute postprocedural pain: Secondary | ICD-10-CM | POA: Diagnosis not present

## 2022-03-17 DIAGNOSIS — K219 Gastro-esophageal reflux disease without esophagitis: Secondary | ICD-10-CM | POA: Diagnosis not present

## 2022-03-17 DIAGNOSIS — G4730 Sleep apnea, unspecified: Secondary | ICD-10-CM | POA: Diagnosis not present

## 2022-03-17 DIAGNOSIS — G4733 Obstructive sleep apnea (adult) (pediatric): Secondary | ICD-10-CM | POA: Diagnosis not present

## 2022-03-17 DIAGNOSIS — Z96651 Presence of right artificial knee joint: Secondary | ICD-10-CM | POA: Diagnosis not present

## 2022-03-17 DIAGNOSIS — E119 Type 2 diabetes mellitus without complications: Secondary | ICD-10-CM

## 2022-03-17 DIAGNOSIS — M25461 Effusion, right knee: Secondary | ICD-10-CM | POA: Diagnosis not present

## 2022-03-17 DIAGNOSIS — Z9989 Dependence on other enabling machines and devices: Secondary | ICD-10-CM | POA: Diagnosis not present

## 2022-03-17 DIAGNOSIS — Z471 Aftercare following joint replacement surgery: Secondary | ICD-10-CM | POA: Diagnosis not present

## 2022-03-17 DIAGNOSIS — I10 Essential (primary) hypertension: Secondary | ICD-10-CM | POA: Diagnosis not present

## 2022-03-17 DIAGNOSIS — M1711 Unilateral primary osteoarthritis, right knee: Secondary | ICD-10-CM | POA: Diagnosis not present

## 2022-03-17 HISTORY — PX: TOTAL KNEE ARTHROPLASTY: SHX125

## 2022-03-17 LAB — GLUCOSE, CAPILLARY: Glucose-Capillary: 99 mg/dL (ref 70–99)

## 2022-03-17 SURGERY — ARTHROPLASTY, KNEE, TOTAL
Anesthesia: Spinal | Site: Knee | Laterality: Right

## 2022-03-17 MED ORDER — PROPOFOL 10 MG/ML IV BOLUS
INTRAVENOUS | Status: DC | PRN
  Administered 2022-03-17: 20 mg via INTRAVENOUS
  Administered 2022-03-17 (×2): 10 mg via INTRAVENOUS

## 2022-03-17 MED ORDER — MEPERIDINE HCL 50 MG/ML IJ SOLN: 6.2500 mg | INTRAMUSCULAR | Status: DC | PRN

## 2022-03-17 MED ORDER — TRANEXAMIC ACID-NACL 1000-0.7 MG/100ML-% IV SOLN
INTRAVENOUS | Status: AC
  Administered 2022-03-17: 1000 mg via INTRAVENOUS
  Filled 2022-03-17: qty 100

## 2022-03-17 MED ORDER — EPHEDRINE SULFATE-NACL 50-0.9 MG/10ML-% IV SOSY
PREFILLED_SYRINGE | INTRAVENOUS | Status: DC | PRN
  Administered 2022-03-17: 10 mg via INTRAVENOUS
  Administered 2022-03-17: 5 mg via INTRAVENOUS

## 2022-03-17 MED ORDER — PROPOFOL 500 MG/50ML IV EMUL
INTRAVENOUS | Status: AC
  Filled 2022-03-17: qty 50

## 2022-03-17 MED ORDER — LACTATED RINGERS IV SOLN: INTRAVENOUS | Status: DC

## 2022-03-17 MED ORDER — CHLORHEXIDINE GLUCONATE 0.12 % MT SOLN
15.0000 mL | Freq: Once | OROMUCOSAL | Status: AC
  Administered 2022-03-17: 15 mL via OROMUCOSAL

## 2022-03-17 MED ORDER — MIDAZOLAM HCL 2 MG/2ML IJ SOLN
INTRAMUSCULAR | Status: AC
  Filled 2022-03-17: qty 2

## 2022-03-17 MED ORDER — TRANEXAMIC ACID-NACL 1000-0.7 MG/100ML-% IV SOLN
1000.0000 mg | INTRAVENOUS | Status: AC
Start: 2022-03-17 — End: 2022-03-17
  Administered 2022-03-17: 1000 mg via INTRAVENOUS
  Filled 2022-03-17: qty 100

## 2022-03-17 MED ORDER — SODIUM CHLORIDE 0.9 % IR SOLN
Status: DC | PRN
  Administered 2022-03-17: 1000 mL

## 2022-03-17 MED ORDER — ORAL CARE MOUTH RINSE: 15.0000 mL | Freq: Once | OROMUCOSAL | Status: AC

## 2022-03-17 MED ORDER — ACETAMINOPHEN 325 MG PO TABS: 325.0000 mg | ORAL_TABLET | Freq: Once | ORAL | Status: DC | PRN

## 2022-03-17 MED ORDER — CEFAZOLIN SODIUM-DEXTROSE 2-4 GM/100ML-% IV SOLN
INTRAVENOUS | Status: AC
  Administered 2022-03-17: 2 g via INTRAVENOUS
  Filled 2022-03-17: qty 100

## 2022-03-17 MED ORDER — ONDANSETRON HCL 4 MG/2ML IJ SOLN
INTRAMUSCULAR | Status: AC
  Filled 2022-03-17: qty 2

## 2022-03-17 MED ORDER — TRANEXAMIC ACID-NACL 1000-0.7 MG/100ML-% IV SOLN: 1000.0000 mg | Freq: Once | INTRAVENOUS | Status: DC

## 2022-03-17 MED ORDER — OXYCODONE HCL 10 MG PO TABS: 5.0000 mg | ORAL_TABLET | Freq: Three times a day (TID) | ORAL | 0 refills | Status: DC | PRN

## 2022-03-17 MED ORDER — POVIDONE-IODINE 10 % EX SWAB: 2.0000 | Freq: Once | CUTANEOUS | Status: DC

## 2022-03-17 MED ORDER — DEXAMETHASONE SODIUM PHOSPHATE 10 MG/ML IJ SOLN
INTRAMUSCULAR | Status: AC
  Filled 2022-03-17: qty 1

## 2022-03-17 MED ORDER — OXYCODONE HCL 5 MG PO TABS: 5.0000 mg | ORAL_TABLET | ORAL | Status: DC | PRN

## 2022-03-17 MED ORDER — AMISULPRIDE (ANTIEMETIC) 5 MG/2ML IV SOLN: 10.0000 mg | Freq: Once | INTRAVENOUS | Status: DC | PRN

## 2022-03-17 MED ORDER — PHENYLEPHRINE HCL-NACL 20-0.9 MG/250ML-% IV SOLN
INTRAVENOUS | Status: DC | PRN
  Administered 2022-03-17: 20 ug/min via INTRAVENOUS

## 2022-03-17 MED ORDER — BUPIVACAINE LIPOSOME 1.3 % IJ SUSP
INTRAMUSCULAR | Status: AC
  Filled 2022-03-17: qty 20

## 2022-03-17 MED ORDER — FENTANYL CITRATE PF 50 MCG/ML IJ SOSY
50.0000 ug | PREFILLED_SYRINGE | Freq: Once | INTRAMUSCULAR | Status: AC
  Administered 2022-03-17: 50 ug via INTRAVENOUS
  Filled 2022-03-17: qty 2

## 2022-03-17 MED ORDER — PROPOFOL 1000 MG/100ML IV EMUL
INTRAVENOUS | Status: AC
  Filled 2022-03-17: qty 100

## 2022-03-17 MED ORDER — LACTATED RINGERS IV BOLUS
500.0000 mL | Freq: Once | INTRAVENOUS | Status: AC
  Administered 2022-03-17: 500 mL via INTRAVENOUS

## 2022-03-17 MED ORDER — ONDANSETRON 4 MG PO TBDP: 4.0000 mg | ORAL_TABLET | Freq: Two times a day (BID) | ORAL | 0 refills | Status: AC | PRN

## 2022-03-17 MED ORDER — DEXAMETHASONE SODIUM PHOSPHATE 10 MG/ML IJ SOLN
8.0000 mg | Freq: Once | INTRAMUSCULAR | Status: AC
  Administered 2022-03-17: 8 mg via INTRAVENOUS

## 2022-03-17 MED ORDER — SODIUM CHLORIDE (PF) 0.9 % IJ SOLN
INTRAMUSCULAR | Status: DC | PRN
  Administered 2022-03-17: 80 mL

## 2022-03-17 MED ORDER — LIDOCAINE 2% (20 MG/ML) 5 ML SYRINGE
INTRAMUSCULAR | Status: DC | PRN
  Administered 2022-03-17: 40 mg via INTRAVENOUS

## 2022-03-17 MED ORDER — OXYCODONE HCL 5 MG PO TABS
ORAL_TABLET | ORAL | Status: AC
  Filled 2022-03-17: qty 1

## 2022-03-17 MED ORDER — OXYCODONE HCL 5 MG PO TABS
ORAL_TABLET | ORAL | Status: AC
  Administered 2022-03-17: 5 mg via ORAL
  Filled 2022-03-17: qty 2

## 2022-03-17 MED ORDER — ACETAMINOPHEN 160 MG/5ML PO SOLN: 325.0000 mg | Freq: Once | ORAL | Status: DC | PRN

## 2022-03-17 MED ORDER — BUPIVACAINE IN DEXTROSE 0.75-8.25 % IT SOLN
INTRATHECAL | Status: DC | PRN
  Administered 2022-03-17: 1.6 mL via INTRATHECAL

## 2022-03-17 MED ORDER — ONDANSETRON HCL 4 MG/2ML IJ SOLN
INTRAMUSCULAR | Status: DC | PRN
  Administered 2022-03-17: 4 mg via INTRAVENOUS

## 2022-03-17 MED ORDER — CEFAZOLIN SODIUM-DEXTROSE 2-4 GM/100ML-% IV SOLN: 2.0000 g | Freq: Four times a day (QID) | INTRAVENOUS | Status: DC

## 2022-03-17 MED ORDER — MIDAZOLAM HCL 2 MG/2ML IJ SOLN
INTRAMUSCULAR | Status: DC | PRN
  Administered 2022-03-17 (×2): 1 mg via INTRAVENOUS

## 2022-03-17 MED ORDER — MIDAZOLAM HCL 2 MG/2ML IJ SOLN
1.0000 mg | Freq: Once | INTRAMUSCULAR | Status: AC
  Administered 2022-03-17: 2 mg via INTRAVENOUS
  Filled 2022-03-17: qty 2

## 2022-03-17 MED ORDER — ACETAMINOPHEN 500 MG PO TABS
1000.0000 mg | ORAL_TABLET | Freq: Once | ORAL | Status: DC
  Filled 2022-03-17: qty 2

## 2022-03-17 MED ORDER — OXYCODONE HCL 5 MG PO TABS
10.0000 mg | ORAL_TABLET | ORAL | Status: DC | PRN
  Administered 2022-03-17: 10 mg via ORAL

## 2022-03-17 MED ORDER — BUPIVACAINE LIPOSOME 1.3 % IJ SUSP: 20.0000 mL | Freq: Once | INTRAMUSCULAR | Status: DC

## 2022-03-17 MED ORDER — HYDROMORPHONE HCL 1 MG/ML IJ SOLN
INTRAMUSCULAR | Status: AC
  Filled 2022-03-17: qty 1

## 2022-03-17 MED ORDER — PROPOFOL 500 MG/50ML IV EMUL
INTRAVENOUS | Status: DC | PRN
  Administered 2022-03-17: 40 ug/kg/min via INTRAVENOUS

## 2022-03-17 MED ORDER — ACETAMINOPHEN 10 MG/ML IV SOLN: 1000.0000 mg | Freq: Once | INTRAVENOUS | Status: DC | PRN

## 2022-03-17 MED ORDER — LACTATED RINGERS IV BOLUS
250.0000 mL | Freq: Once | INTRAVENOUS | Status: AC
  Administered 2022-03-17: 250 mL via INTRAVENOUS

## 2022-03-17 MED ORDER — WATER FOR IRRIGATION, STERILE IR SOLN
Status: DC | PRN
  Administered 2022-03-17 (×2): 1000 mL

## 2022-03-17 MED ORDER — PROMETHAZINE HCL 25 MG/ML IJ SOLN: 6.2500 mg | INTRAMUSCULAR | Status: DC | PRN

## 2022-03-17 MED ORDER — METHOCARBAMOL 750 MG PO TABS: 750.0000 mg | ORAL_TABLET | Freq: Three times a day (TID) | ORAL | 0 refills | Status: DC | PRN

## 2022-03-17 MED ORDER — TRANEXAMIC ACID-NACL 1000-0.7 MG/100ML-% IV SOLN: 1000.0000 mg | Freq: Once | INTRAVENOUS | Status: AC

## 2022-03-17 MED ORDER — BUPIVACAINE-EPINEPHRINE (PF) 0.25% -1:200000 IJ SOLN
INTRAMUSCULAR | Status: AC
  Filled 2022-03-17: qty 30

## 2022-03-17 MED ORDER — EPHEDRINE 5 MG/ML INJ
INTRAVENOUS | Status: AC
  Filled 2022-03-17: qty 5

## 2022-03-17 MED ORDER — 0.9 % SODIUM CHLORIDE (POUR BTL) OPTIME
TOPICAL | Status: DC | PRN
  Administered 2022-03-17: 1000 mL

## 2022-03-17 MED ORDER — HYDROMORPHONE HCL 1 MG/ML IJ SOLN
0.2500 mg | INTRAMUSCULAR | Status: DC | PRN
  Administered 2022-03-17: 0.5 mg via INTRAVENOUS

## 2022-03-17 MED ORDER — POLYETHYLENE GLYCOL 3350 17 G PO PACK: 17.0000 g | PACK | Freq: Every day | ORAL | 0 refills | Status: AC

## 2022-03-17 MED ORDER — ROPIVACAINE HCL 5 MG/ML IJ SOLN
INTRAMUSCULAR | Status: DC | PRN
  Administered 2022-03-17: 30 mL via PERINEURAL

## 2022-03-17 MED ORDER — POVIDONE-IODINE 10 % EX SWAB
2.0000 | Freq: Once | CUTANEOUS | Status: AC
  Administered 2022-03-17: 2 via TOPICAL

## 2022-03-17 MED ORDER — SODIUM CHLORIDE (PF) 0.9 % IJ SOLN
INTRAMUSCULAR | Status: AC
  Filled 2022-03-17: qty 50

## 2022-03-17 MED ORDER — ASPIRIN 81 MG PO TBEC: 81.0000 mg | DELAYED_RELEASE_TABLET | Freq: Two times a day (BID) | ORAL | 0 refills | Status: DC

## 2022-03-17 MED ORDER — CEFAZOLIN SODIUM-DEXTROSE 2-4 GM/100ML-% IV SOLN
2.0000 g | INTRAVENOUS | Status: AC
  Administered 2022-03-17: 2 g via INTRAVENOUS
  Filled 2022-03-17: qty 100

## 2022-03-17 SURGICAL SUPPLY — 52 items
BAG COUNTER SPONGE SURGICOUNT (BAG) IMPLANT
BAG SPNG CNTER NS LX DISP (BAG)
BLADE HEX COATED 2.75 (ELECTRODE) ×1 IMPLANT
BLADE SAG 18X100X1.27 (BLADE) ×1 IMPLANT
BLADE SAGITTAL 25.0X1.37X90 (BLADE) ×1 IMPLANT
BLADE SURG 15 STRL LF DISP TIS (BLADE) ×1 IMPLANT
BLADE SURG 15 STRL SS (BLADE) ×1
BLADE SURG SZ10 CARB STEEL (BLADE) IMPLANT
BNDG CMPR MED 10X6 ELC LF (GAUZE/BANDAGES/DRESSINGS) ×1
BNDG ELASTIC 6X10 VLCR STRL LF (GAUZE/BANDAGES/DRESSINGS) ×1 IMPLANT
BOWL SMART MIX CTS (DISPOSABLE) IMPLANT
BSPLAT TIB 3 KN TRITANIUM (Knees) ×1 IMPLANT
CLSR STERI-STRIP ANTIMIC 1/2X4 (GAUZE/BANDAGES/DRESSINGS) ×1 IMPLANT
COMPONENT TRI CR RETAIN KNEE (Orthopedic Implant) IMPLANT
COVER SURGICAL LIGHT HANDLE (MISCELLANEOUS) ×1 IMPLANT
CUFF TOURN SGL QUICK 34 (TOURNIQUET CUFF) ×1
CUFF TRNQT CYL 34X4.125X (TOURNIQUET CUFF) ×1 IMPLANT
DRAPE U-SHAPE 47X51 STRL (DRAPES) ×1 IMPLANT
DRSG MEPILEX POST OP 4X12 (GAUZE/BANDAGES/DRESSINGS) ×1 IMPLANT
DURAPREP 26ML APPLICATOR (WOUND CARE) ×2 IMPLANT
GLOVE BIO SURGEON STRL SZ7.5 (GLOVE) ×2 IMPLANT
GLOVE BIOGEL PI IND STRL 7.5 (GLOVE) ×1 IMPLANT
GLOVE BIOGEL PI IND STRL 8 (GLOVE) ×1 IMPLANT
GLOVE SURG SYN 7.5  E (GLOVE) ×1
GLOVE SURG SYN 7.5 E (GLOVE) ×1 IMPLANT
GLOVE SURG SYN 7.5 PF PI (GLOVE) ×1 IMPLANT
GOWN SPEC L4 XLG W/TWL (GOWN DISPOSABLE) ×1 IMPLANT
GOWN STRL REUS W/ TWL LRG LVL3 (GOWN DISPOSABLE) ×1 IMPLANT
GOWN STRL REUS W/TWL LRG LVL3 (GOWN DISPOSABLE) ×1
HANDPIECE INTERPULSE COAX TIP (DISPOSABLE) ×1
HOLDER FOLEY CATH W/STRAP (MISCELLANEOUS) IMPLANT
IMMOBILIZER KNEE 22 UNIV (SOFTGOODS) ×1 IMPLANT
INSERT TIB CS TRIATH X3 9 (Insert) IMPLANT
KNEE PATELLA ASYMMETRIC 9X29 (Knees) IMPLANT
KNEE TIBIAL COMPONENT SZ3 (Knees) IMPLANT
MANIFOLD NEPTUNE II (INSTRUMENTS) ×1 IMPLANT
NS IRRIG 1000ML POUR BTL (IV SOLUTION) ×1 IMPLANT
PACK TOTAL KNEE CUSTOM (KITS) ×1 IMPLANT
PIN FLUTED HEDLESS FIX 3.5X1/8 (PIN) IMPLANT
PROTECTOR NERVE ULNAR (MISCELLANEOUS) ×1 IMPLANT
SET HNDPC FAN SPRY TIP SCT (DISPOSABLE) ×1 IMPLANT
SPIKE FLUID TRANSFER (MISCELLANEOUS) ×1 IMPLANT
SUT MNCRL AB 3-0 PS2 18 (SUTURE) ×1 IMPLANT
SUT VIC AB 0 CT1 36 (SUTURE) ×1 IMPLANT
SUT VIC AB 1 CT1 36 (SUTURE) ×2 IMPLANT
SUT VIC AB 2-0 CT1 27 (SUTURE) ×1
SUT VIC AB 2-0 CT1 TAPERPNT 27 (SUTURE) ×1 IMPLANT
TRAY FOLEY MTR SLVR 14FR STAT (SET/KITS/TRAYS/PACK) IMPLANT
TRAY FOLEY MTR SLVR 16FR STAT (SET/KITS/TRAYS/PACK) IMPLANT
TRIA CRUCIATE RETAIN KNEE (Orthopedic Implant) ×1 IMPLANT
TUBE SUCTION HIGH CAP CLEAR NV (SUCTIONS) ×1 IMPLANT
WRAP KNEE MAXI GEL POST OP (GAUZE/BANDAGES/DRESSINGS) ×1 IMPLANT

## 2022-03-17 NOTE — Interval H&P Note (Signed)
History and Physical Interval Note:  03/17/2022 9:47 AM  Susan Gould  has presented today for surgery, with the diagnosis of OSTEOARTHRITIS RIGHT KNEE.  The various methods of treatment have been discussed with the patient and family. After consideration of risks, benefits and other options for treatment, the patient has consented to  Procedure(s): RIGHT TOTAL KNEE ARTHROPLASTY (Right) as a surgical intervention.  The patient's history has been reviewed, patient examined, no change in status, stable for surgery.  I have reviewed the patient's chart and labs.  Questions were answered to the patient's satisfaction.     Renette Butters

## 2022-03-17 NOTE — Anesthesia Procedure Notes (Signed)
Spinal  Start time: 03/17/2022 10:46 AM End time: 03/17/2022 10:48 AM Reason for block: surgical anesthesia Staffing Performed: anesthesiologist  Anesthesiologist: Effie Berkshire, MD Performed by: Effie Berkshire, MD Authorized by: Effie Berkshire, MD   Preanesthetic Checklist Completed: patient identified, IV checked, site marked, risks and benefits discussed, surgical consent, monitors and equipment checked, pre-op evaluation and timeout performed Spinal Block Patient position: sitting Prep: DuraPrep and site prepped and draped Location: L3-4 Injection technique: single-shot Needle Needle type: Pencan  Needle gauge: 24 G Needle length: 10 cm Needle insertion depth: 10 cm Additional Notes Patient tolerated well. No immediate complications.  Functioning IV was confirmed and monitors were applied. Sterile prep and drape, including hand hygiene and sterile gloves were used. The patient was positioned and the back was prepped. The skin was anesthetized with lidocaine. Free flow of clear CSF was obtained prior to injecting local anesthetic into the CSF. The spinal needle aspirated freely following injection. The needle was carefully withdrawn. The patient tolerated the procedure well.

## 2022-03-17 NOTE — Transfer of Care (Signed)
Immediate Anesthesia Transfer of Care Note  Patient: Susan Gould  Procedure(s) Performed: RIGHT TOTAL KNEE ARTHROPLASTY (Right: Knee)  Patient Location: PACU  Anesthesia Type:Spinal  Level of Consciousness: sedated  Airway & Oxygen Therapy: Patient Spontanous Breathing and Patient connected to face mask oxygen  Post-op Assessment: Report given to RN and Post -op Vital signs reviewed and stable  Post vital signs: Reviewed and stable  Last Vitals:  Vitals Value Taken Time  BP 91/75 03/17/22 1240  Temp    Pulse 92 03/17/22 1242  Resp 15 03/17/22 1242  SpO2 100 % 03/17/22 1242  Vitals shown include unvalidated device data.  Last Pain:  Vitals:   03/17/22 0940  TempSrc: Oral  PainSc:       Patients Stated Pain Goal: 5 (88/73/73 0816)  Complications: No notable events documented.

## 2022-03-17 NOTE — Anesthesia Postprocedure Evaluation (Signed)
Anesthesia Post Note  Patient: Susan Gould  Procedure(s) Performed: RIGHT TOTAL KNEE ARTHROPLASTY (Right: Knee)     Patient location during evaluation: PACU Anesthesia Type: Spinal Level of consciousness: oriented and awake and alert Pain management: pain level controlled Vital Signs Assessment: post-procedure vital signs reviewed and stable Respiratory status: spontaneous breathing, respiratory function stable and patient connected to nasal cannula oxygen Cardiovascular status: blood pressure returned to baseline and stable Postop Assessment: no headache, no backache and no apparent nausea or vomiting Anesthetic complications: no   No notable events documented.  Last Vitals:  Vitals:   03/17/22 1415 03/17/22 1704  BP:  137/74  Pulse: (!) 101 80  Resp: 11   Temp:  36.7 C  SpO2: 95%     Last Pain:  Vitals:   03/17/22 1400  TempSrc:   PainSc: 0-No pain                 Effie Berkshire

## 2022-03-17 NOTE — Discharge Instructions (Signed)
POST-OPERATIVE OPIOID TAPER INSTRUCTIONS: It is important to wean off of your opioid medication as soon as possible. If you do not need pain medication after your surgery it is ok to stop day one. Opioids include: Codeine, Hydrocodone(Norco, Vicodin), Oxycodone(Percocet, oxycontin) and hydromorphone amongst others.  Long term and even short term use of opiods can cause: Increased pain response Dependence Constipation Depression Respiratory depression And more.  Withdrawal symptoms can include Flu like symptoms Nausea, vomiting And more Techniques to manage these symptoms Hydrate well Eat regular healthy meals Stay active Use relaxation techniques(deep breathing, meditating, yoga) Do Not substitute Alcohol to help with tapering If you have been on opioids for less than two weeks and do not have pain than it is ok to stop all together.  Plan to wean off of opioids This plan should start within one week post op of your joint replacement. Maintain the same interval or time between taking each dose and first decrease the dose.  Cut the total daily intake of opioids by one tablet each day Next start to increase the time between doses. The last dose that should be eliminated is the evening dose.

## 2022-03-17 NOTE — Anesthesia Procedure Notes (Signed)
Anesthesia Regional Block: Adductor canal block   Pre-Anesthetic Checklist: , timeout performed,  Correct Patient, Correct Site, Correct Laterality,  Correct Procedure, Correct Position, site marked,  Risks and benefits discussed,  Surgical consent,  Pre-op evaluation,  At surgeon's request and post-op pain management  Laterality: Right  Prep: chloraprep       Needles:  Injection technique: Single-shot  Needle Type: Echogenic Stimulator Needle     Needle Length: 9cm  Needle Gauge: 21     Additional Needles:   Procedures:,,,, ultrasound used (permanent image in chart),,    Narrative:  Start time: 03/17/2022 9:40 AM End time: 03/17/2022 9:45 AM Injection made incrementally with aspirations every 5 mL.  Performed by: Personally  Anesthesiologist: Effie Berkshire, MD  Additional Notes: Patient tolerated the procedure well. Local anesthetic introduced in an incremental fashion under minimal resistance after negative aspirations. No paresthesias were elicited. After completion of the procedure, no acute issues were identified and patient continued to be monitored by RN.

## 2022-03-17 NOTE — Op Note (Signed)
DATE OF SURGERY:  03/17/2022 TIME: 12:19 PM  PATIENT NAME:  Susan Gould   AGE: 56 y.o.    PRE-OPERATIVE DIAGNOSIS:  OSTEOARTHRITIS RIGHT KNEE  POST-OPERATIVE DIAGNOSIS:  Same  PROCEDURE:  Procedure(s): RIGHT TOTAL KNEE ARTHROPLASTY   SURGEON:  Renette Butters, MD   ASSISTANT:  Aggie Moats, PA-C, he was present and scrubbed throughout the case, critical for completion in a timely fashion, and for retraction, instrumentation, and closure.    OPERATIVE IMPLANTS: [employer] Triathlon CR. Press fit knee  Femur size 3, Tibia size 3, Patella size 29 3-peg oval button, with a 9 mm polyethylene insert.   PREOPERATIVE INDICATIONS:  Susan Gould is a 56 y.o. year old female with end stage bone on bone degenerative arthritis of the knee who failed conservative treatment, including injections, antiinflammatories, activity modification, and assistive devices, and had significant impairment of their activities of daily living, and elected for Total Knee Arthroplasty.   The risks, benefits, and alternatives were discussed at length including but not limited to the risks of infection, bleeding, nerve injury, stiffness, blood clots, the need for revision surgery, cardiopulmonary complications, among others, and they were willing to proceed.   OPERATIVE DESCRIPTION:  The patient was brought to the operative room and placed in a supine position.  General anesthesia was administered.  IV antibiotics were given.  The lower extremity was prepped and draped in the usual sterile fashion.  Time out was performed.  The leg was elevated and exsanguinated and the tourniquet was inflated.  Anterior approach was performed.  The patella was everted and osteophytes were removed.  The anterior horn of the medial and lateral meniscus was removed.   The distal femur was opened with the drill and the intramedullary distal femoral cutting jig was utilized, set at 5 degrees resecting 10 mm off the distal  femur.  Care was taken to protect the collateral ligaments.  The distal femoral sizing jig was applied, taking care to avoid notching.  Then the 4-in-1 cutting jig was applied and the anterior and posterior femur was cut, along with the chamfer cuts.  All posterior osteophytes were removed.  The flexion gap was then measured and was symmetric with the extension gap.  Then the extramedullary tibial cutting jig was utilized making the appropriate cut using the anterior tibial crest as a reference building in appropriate posterior slope.  Care was taken during the cut to protect the medial and collateral ligaments.  The proximal tibia was removed along with the posterior horns of the menisci.  The PCL was sacrificed.    The extensor gap was measured and was approximately 2m.    I completed the distal femoral preparation using the appropriate jig to prepare the box.  The patella was then measured, and cut with the saw.    The proximal tibia sized and prepared accordingly with the reamer and the punch, and then all components were trialed with the above sized poly insert.  The knee was found to have excellent balance and full motion.    The above named components were then impacted into place and Poly tibial piece and patella were inserted.  I was very happy with his stability and ROM  I performed a periarticular injection with marcaine and toradol  The knee was easily taken through a range of motion and the patella tracked well and the knee irrigated copiously and the parapatellar and subcutaneous tissue closed with vicryl, and monocryl with steri strips for the skin.  The incision was dressed with sterile gauze and the tourniquet released and the patient was awakened and returned to the PACU in stable and satisfactory condition.  There were no complications.  Total tourniquet time was roughly 75 minutes.   POSTOPERATIVE PLAN: post op Abx, DVT px: SCD's, TED's, Early ambulation and chemical  px

## 2022-03-17 NOTE — Anesthesia Procedure Notes (Signed)
Procedure Name: MAC Date/Time: 03/17/2022 10:41 AM  Performed by: Eben Burow, CRNAPre-anesthesia Checklist: Patient identified, Emergency Drugs available, Suction available, Patient being monitored and Timeout performed Oxygen Delivery Method: Simple face mask Placement Confirmation: positive ETCO2

## 2022-03-17 NOTE — Evaluation (Signed)
Physical Therapy Evaluation Patient Details Name: Susan Gould MRN: [id number] DOB: Mar 08, 1966 Today's Date: 03/17/2022  History of Present Illness  Pt is a 56yo female presenting s/p R-TKA on 03/17/22. PMH: hx of breast cancer s/p radiation and lumpectomy, GERD, HLD, HTN, OSA on CPAP, bilateral carpal tunnel release.   Clinical Impression  Susan Gould is a 56 y.o. female POD 0 s/p R-TKA. Patient reports independence with mobility at baseline. Patient is now limited by functional impairments (see PT problem list below) and requires min guard for transfers and gait with RW. Patient was able to ambulate 60 feet with RW and min guard and cues for safe walker management. Patient educated on safe sequencing for stair mobility and verbalized safe guarding position for people assisting with mobility. Patient instructed in exercises to facilitate ROM and circulation. Patient will benefit from continued skilled PT interventions to address impairments and progress towards PLOF. Patient has met mobility goals at adequate level for discharge home; will continue to follow if pt continues acute stay to progress towards Mod I goals.       Recommendations for follow up therapy are one component of a multi-disciplinary discharge planning process, led by the attending physician.  Recommendations may be updated based on patient status, additional functional criteria and insurance authorization.  Follow Up Recommendations Follow physician's recommendations for discharge plan and follow up therapies      Assistance Recommended at Discharge Intermittent Supervision/Assistance  Patient can return home with the following  A little help with walking and/or transfers;A little help with bathing/dressing/bathroom;Assistance with cooking/housework;Assist for transportation;Help with stairs or ramp for entrance    Equipment Recommendations None recommended by PT  Recommendations for Other Services        Functional Status Assessment Patient has had a recent decline in their functional status and demonstrates the ability to make significant improvements in function in a reasonable and predictable amount of time.     Precautions / Restrictions Precautions Precautions: Knee Precaution Booklet Issued: No Precaution Comments: no pillow under the knee Restrictions Weight Bearing Restrictions: No Other Position/Activity Restrictions: wbat      Mobility  Bed Mobility Overal bed mobility: Needs Assistance Bed Mobility: Supine to Sit     Supine to sit: Supervision     General bed mobility comments: For safety only    Transfers Overall transfer level: Needs assistance Equipment used: Rolling walker (2 wheels) Transfers: Sit to/from Stand Sit to Stand: Min guard, From elevated surface           General transfer comment: For safety only from stretcher, no physical assist required, VCs for hand placement    Ambulation/Gait Ambulation/Gait assistance: Min guard, Supervision Gait Distance (Feet): 60 Feet Assistive device: Rolling walker (2 wheels) Gait Pattern/deviations: Step-to pattern       General Gait Details: Pt ambulated with RW and min guard, no physical assist required or overt LOB noted.  Stairs Stairs: Yes Stairs assistance: Min assist, +2 safety/equipment Stair Management: No rails, Step to pattern, Backwards, With walker Number of Stairs: 3 General stair comments: Provided handout, all questions answered and education completed. Pt and sister demonstrated safe mobility with VCs and sister providing min assist PT providing gaurding and cuing, no overt LOB noted.  Wheelchair Mobility    Modified Rankin (Stroke Patients Only)       Balance Overall balance assessment: Needs assistance Sitting-balance support: Feet supported, No upper extremity supported Sitting balance-Leahy Scale: Good     Standing balance support: Reliant on  assistive device for  balance, During functional activity, Bilateral upper extremity supported Standing balance-Leahy Scale: Fair                               Pertinent Vitals/Pain Pain Assessment Pain Assessment: 0-10 Pain Score: 4  Pain Location: back of the knee Pain Descriptors / Indicators: Operative site guarding Pain Intervention(s): Limited activity within patient's tolerance, Monitored during session, Repositioned, Ice applied    Home Living Family/patient expects to be discharged to:: Private residence Living Arrangements: Alone Available Help at Discharge: Friend(s);Available 24 hours/day (Friend LIsa) Type of Home: House Home Access: Stairs to enter Entrance Stairs-Rails: None Entrance Stairs-Number of Steps: 2   Home Layout: Able to live on main level with bedroom/bathroom;Bed/bath upstairs Home Equipment: Conservation officer, nature (2 wheels);Crutches      Prior Function Prior Level of Function : Independent/Modified Independent;Driving;Working/employed Ecologist, off for 2 weeks)             Mobility Comments: IND ADLs Comments: IND     Hand Dominance        Extremity/Trunk Assessment   Upper Extremity Assessment Upper Extremity Assessment: Overall WFL for tasks assessed    Lower Extremity Assessment Lower Extremity Assessment: RLE deficits/detail;LLE deficits/detail RLE Deficits / Details: MMT ank DF/PF 5/5, no extensor lag noted RLE Sensation: WNL LLE Deficits / Details: MMT ank DF/PF 5/5 LLE Sensation: WNL    Cervical / Trunk Assessment Cervical / Trunk Assessment: Kyphotic  Communication   Communication: No difficulties  Cognition Arousal/Alertness: Awake/alert Behavior During Therapy: WFL for tasks assessed/performed Overall Cognitive Status: Within Functional Limits for tasks assessed                                          General Comments      Exercises Total Joint Exercises Ankle Circles/Pumps: AROM, Both, 10 reps Quad Sets:  AROM, Right, Other reps (comment) (2) Short Arc Quad: AROM, Right, Other reps (comment) (2) Heel Slides: AROM, Right, Other reps (comment) (2) Hip ABduction/ADduction: AROM, Right, Other reps (comment) (2) Straight Leg Raises: AROM, Other reps (comment), Right (2) Goniometric ROM: -5-90deg by visual approximation   Assessment/Plan    PT Assessment Patient needs continued PT services  PT Problem List Decreased strength;Decreased range of motion;Decreased activity tolerance;Decreased balance;Decreased mobility;Decreased coordination;Pain       PT Treatment Interventions DME instruction;Gait training;Stair training;Functional mobility training;Therapeutic activities;Therapeutic exercise;Balance training;Neuromuscular re-education;Patient/family education    PT Goals (Current goals can be found in the Care Plan section)  Acute Rehab PT Goals Patient Stated Goal: return to power walking PT Goal Formulation: With patient Time For Goal Achievement: 03/24/22 Potential to Achieve Goals: Good    Frequency 7X/week     Co-evaluation               AM-PAC PT "6 Clicks" Mobility  Outcome Measure Help needed turning from your back to your side while in a flat bed without using bedrails?: A Little Help needed moving from lying on your back to sitting on the side of a flat bed without using bedrails?: A Little Help needed moving to and from a bed to a chair (including a wheelchair)?: A Little Help needed standing up from a chair using your arms (e.g., wheelchair or bedside chair)?: A Little Help needed to walk in hospital room?: A Little Help needed  climbing 3-5 steps with a railing? : A Little 6 Click Score: 18    End of Session Equipment Utilized During Treatment: Gait belt;Right knee immobilizer Activity Tolerance: Patient tolerated treatment well;No increased pain Patient left: in chair;with call bell/phone within reach;with family/visitor present Nurse Communication: Mobility  status PT Visit Diagnosis: Pain;Difficulty in walking, not elsewhere classified (R26.2) Pain - Right/Left: Right Pain - part of body: Knee    Time: 1539-1630 PT Time Calculation (min) (ACUTE ONLY): 51 min   Charges:   PT Evaluation $PT Eval Low Complexity: 1 Low PT Treatments $Gait Training: 8-22 mins $Therapeutic Exercise: 8-22 mins        Coolidge Breeze, PT, DPT [facility] Department Office: [phone]  Coolidge Breeze 03/17/2022, 5:04 PM

## 2022-03-17 NOTE — Anesthesia Preprocedure Evaluation (Addendum)
Anesthesia Evaluation  Patient identified by MRN, date of birth, ID band Patient awake    Reviewed: Allergy & Precautions, NPO status , Patient's Chart, lab work & pertinent test results  Airway Mallampati: II  TM Distance: >3 FB Neck ROM: Full    Dental  (+) Teeth Intact, Dental Advisory Given   Pulmonary asthma , sleep apnea and Continuous Positive Airway Pressure Ventilation ,    breath sounds clear to auscultation       Cardiovascular hypertension,  Rhythm:Regular Rate:Normal     Neuro/Psych PSYCHIATRIC DISORDERS Anxiety Depression negative neurological ROS     GI/Hepatic GERD  ,(+) Hepatitis -  Endo/Other  negative endocrine ROS  Renal/GU negative Renal ROS     Musculoskeletal  (+) Arthritis ,   Abdominal   Peds  Hematology negative hematology ROS (+)   Anesthesia Other Findings   Reproductive/Obstetrics                            Anesthesia Physical Anesthesia Plan  ASA: 3  Anesthesia Plan: Spinal   Post-op Pain Management: Regional block*   Induction: Intravenous  PONV Risk Score and Plan: 3 and Ondansetron, Propofol infusion and Midazolam  Airway Management Planned: Natural Airway and Simple Face Mask  Additional Equipment: None  Intra-op Plan:   Post-operative Plan:   Informed Consent: I have reviewed the patients History and Physical, chart, labs and discussed the procedure including the risks, benefits and alternatives for the proposed anesthesia with the patient or authorized representative who has indicated his/her understanding and acceptance.       Plan Discussed with: CRNA  Anesthesia Plan Comments: (Lab Results      Component                Value               Date                      WBC                      6.5                 03/06/2022                HGB                      13.4                03/06/2022                HCT                       41.1                03/06/2022                MCV                      93.0                03/06/2022                PLT                      253  03/06/2022           )       Anesthesia Quick Evaluation

## 2022-03-18 ENCOUNTER — Encounter (HOSPITAL_COMMUNITY): Payer: Self-pay | Admitting: Orthopedic Surgery

## 2022-03-19 DIAGNOSIS — M6281 Muscle weakness (generalized): Secondary | ICD-10-CM | POA: Diagnosis not present

## 2022-03-19 DIAGNOSIS — M1711 Unilateral primary osteoarthritis, right knee: Secondary | ICD-10-CM | POA: Diagnosis not present

## 2022-03-19 DIAGNOSIS — M25661 Stiffness of right knee, not elsewhere classified: Secondary | ICD-10-CM | POA: Diagnosis not present

## 2022-03-19 DIAGNOSIS — R262 Difficulty in walking, not elsewhere classified: Secondary | ICD-10-CM | POA: Diagnosis not present

## 2022-03-23 DIAGNOSIS — F419 Anxiety disorder, unspecified: Secondary | ICD-10-CM | POA: Diagnosis not present

## 2022-03-23 DIAGNOSIS — F3289 Other specified depressive episodes: Secondary | ICD-10-CM | POA: Diagnosis not present

## 2022-03-24 DIAGNOSIS — E669 Obesity, unspecified: Secondary | ICD-10-CM | POA: Diagnosis not present

## 2022-03-24 DIAGNOSIS — Z713 Dietary counseling and surveillance: Secondary | ICD-10-CM | POA: Diagnosis not present

## 2022-03-26 DIAGNOSIS — R262 Difficulty in walking, not elsewhere classified: Secondary | ICD-10-CM | POA: Diagnosis not present

## 2022-03-26 DIAGNOSIS — M1711 Unilateral primary osteoarthritis, right knee: Secondary | ICD-10-CM | POA: Diagnosis not present

## 2022-03-26 DIAGNOSIS — M25661 Stiffness of right knee, not elsewhere classified: Secondary | ICD-10-CM | POA: Diagnosis not present

## 2022-03-26 DIAGNOSIS — M6281 Muscle weakness (generalized): Secondary | ICD-10-CM | POA: Diagnosis not present

## 2022-03-30 ENCOUNTER — Telehealth: Payer: Self-pay | Admitting: Licensed Clinical Social Worker

## 2022-03-30 DIAGNOSIS — F3289 Other specified depressive episodes: Secondary | ICD-10-CM | POA: Diagnosis not present

## 2022-03-30 DIAGNOSIS — F419 Anxiety disorder, unspecified: Secondary | ICD-10-CM | POA: Diagnosis not present

## 2022-03-30 NOTE — Patient Instructions (Signed)
Visit Information  Thank you for taking time to visit with me today. Please don't hesitate to contact me if I can be of assistance to you.   Following are the goals we discussed today:   Goals Addressed             This Visit's Progress    COMPLETED: Care Coordination Activities-No Follow Up Needed       Care Coordination Interventions: Active listening / Reflection utilized  LCSW informed patient of care coordination services. Pt is not interested at this time and agreed to contact PCP, should needs arise  Informed pt of available vaccines at PCP office         If you are experiencing a Mental Health or [place] or need someone to talk to, please call the Suicide and Crisis Lifeline: 988 call 911   Patient verbalizes understanding of instructions and care plan provided today and agrees to view in [place]. Active MyChart status and patient understanding of how to access instructions and care plan via MyChart confirmed with patient.     No further follow up required:    Christa See, MSW, [email or web address] Phone [phone] 4:47 PM

## 2022-03-30 NOTE — Patient Outreach (Signed)
  Care Coordination   Initial Visit Note   03/30/2022 Name: ORLEAN HOLTROP MRN: [id number] DOB: Nov 23, 1965  AZALIE HARBECK is a 56 y.o. year old female who sees Rita Ohara, MD for primary care. I spoke with  Courtney Heys by phone today.  What matters to the patients health and wellness today?  Care Coordination    Goals Addressed             This Visit's Progress    COMPLETED: Care Coordination Activities-No Follow Up Needed       Care Coordination Interventions: Active listening / Reflection utilized  LCSW informed patient of care coordination services. Pt is not interested at this time and agreed to contact PCP, should needs arise  Informed pt of available vaccines at PCP office         SDOH assessments and interventions completed:  No     Care Coordination Interventions Activated:  Yes  Care Coordination Interventions:  Yes, provided   Follow up plan: No further intervention required.   Encounter Outcome:  Pt. Refused   Christa See, MSW, [email or web address] Phone [phone] 4:46 PM

## 2022-03-31 DIAGNOSIS — M1711 Unilateral primary osteoarthritis, right knee: Secondary | ICD-10-CM | POA: Diagnosis not present

## 2022-03-31 DIAGNOSIS — R262 Difficulty in walking, not elsewhere classified: Secondary | ICD-10-CM | POA: Diagnosis not present

## 2022-03-31 DIAGNOSIS — M25661 Stiffness of right knee, not elsewhere classified: Secondary | ICD-10-CM | POA: Diagnosis not present

## 2022-03-31 DIAGNOSIS — M6281 Muscle weakness (generalized): Secondary | ICD-10-CM | POA: Diagnosis not present

## 2022-03-31 DIAGNOSIS — M545 Low back pain, unspecified: Secondary | ICD-10-CM | POA: Diagnosis not present

## 2022-04-01 DIAGNOSIS — M545 Low back pain, unspecified: Secondary | ICD-10-CM | POA: Diagnosis not present

## 2022-04-01 DIAGNOSIS — M1711 Unilateral primary osteoarthritis, right knee: Secondary | ICD-10-CM | POA: Diagnosis not present

## 2022-04-02 DIAGNOSIS — M25661 Stiffness of right knee, not elsewhere classified: Secondary | ICD-10-CM | POA: Diagnosis not present

## 2022-04-02 DIAGNOSIS — M1711 Unilateral primary osteoarthritis, right knee: Secondary | ICD-10-CM | POA: Diagnosis not present

## 2022-04-02 DIAGNOSIS — R262 Difficulty in walking, not elsewhere classified: Secondary | ICD-10-CM | POA: Diagnosis not present

## 2022-04-02 DIAGNOSIS — M6281 Muscle weakness (generalized): Secondary | ICD-10-CM | POA: Diagnosis not present

## 2022-04-06 DIAGNOSIS — F3289 Other specified depressive episodes: Secondary | ICD-10-CM | POA: Diagnosis not present

## 2022-04-06 DIAGNOSIS — F54 Psychological and behavioral factors associated with disorders or diseases classified elsewhere: Secondary | ICD-10-CM | POA: Diagnosis not present

## 2022-04-06 DIAGNOSIS — F419 Anxiety disorder, unspecified: Secondary | ICD-10-CM | POA: Diagnosis not present

## 2022-04-06 DIAGNOSIS — G4733 Obstructive sleep apnea (adult) (pediatric): Secondary | ICD-10-CM | POA: Diagnosis not present

## 2022-04-06 DIAGNOSIS — I10 Essential (primary) hypertension: Secondary | ICD-10-CM | POA: Diagnosis not present

## 2022-04-06 DIAGNOSIS — E669 Obesity, unspecified: Secondary | ICD-10-CM | POA: Diagnosis not present

## 2022-04-07 ENCOUNTER — Encounter: Payer: Self-pay | Admitting: Internal Medicine

## 2022-04-07 DIAGNOSIS — M6281 Muscle weakness (generalized): Secondary | ICD-10-CM | POA: Diagnosis not present

## 2022-04-07 DIAGNOSIS — R262 Difficulty in walking, not elsewhere classified: Secondary | ICD-10-CM | POA: Diagnosis not present

## 2022-04-07 DIAGNOSIS — M1711 Unilateral primary osteoarthritis, right knee: Secondary | ICD-10-CM | POA: Diagnosis not present

## 2022-04-07 DIAGNOSIS — M25661 Stiffness of right knee, not elsewhere classified: Secondary | ICD-10-CM | POA: Diagnosis not present

## 2022-04-09 DIAGNOSIS — M6281 Muscle weakness (generalized): Secondary | ICD-10-CM | POA: Diagnosis not present

## 2022-04-09 DIAGNOSIS — M1711 Unilateral primary osteoarthritis, right knee: Secondary | ICD-10-CM | POA: Diagnosis not present

## 2022-04-09 DIAGNOSIS — M25661 Stiffness of right knee, not elsewhere classified: Secondary | ICD-10-CM | POA: Diagnosis not present

## 2022-04-09 DIAGNOSIS — R262 Difficulty in walking, not elsewhere classified: Secondary | ICD-10-CM | POA: Diagnosis not present

## 2022-04-13 DIAGNOSIS — F3289 Other specified depressive episodes: Secondary | ICD-10-CM | POA: Diagnosis not present

## 2022-04-13 DIAGNOSIS — F419 Anxiety disorder, unspecified: Secondary | ICD-10-CM | POA: Diagnosis not present

## 2022-04-14 DIAGNOSIS — M1711 Unilateral primary osteoarthritis, right knee: Secondary | ICD-10-CM | POA: Diagnosis not present

## 2022-04-14 DIAGNOSIS — M25661 Stiffness of right knee, not elsewhere classified: Secondary | ICD-10-CM | POA: Diagnosis not present

## 2022-04-14 DIAGNOSIS — M6281 Muscle weakness (generalized): Secondary | ICD-10-CM | POA: Diagnosis not present

## 2022-04-14 DIAGNOSIS — R262 Difficulty in walking, not elsewhere classified: Secondary | ICD-10-CM | POA: Diagnosis not present

## 2022-04-15 ENCOUNTER — Ambulatory Visit
Admission: RE | Admit: 2022-04-15 | Discharge: 2022-04-15 | Disposition: A | Discharge status: Home / Self Care | Payer: BC Managed Care – PPO | Source: Ambulatory Visit | Attending: Hematology and Oncology | Admitting: Hematology and Oncology

## 2022-04-15 DIAGNOSIS — C50411 Malignant neoplasm of upper-outer quadrant of right female breast: Secondary | ICD-10-CM

## 2022-04-15 DIAGNOSIS — Z853 Personal history of malignant neoplasm of breast: Secondary | ICD-10-CM | POA: Diagnosis not present

## 2022-04-15 DIAGNOSIS — R92323 Mammographic fibroglandular density, bilateral breasts: Secondary | ICD-10-CM | POA: Diagnosis not present

## 2022-04-16 DIAGNOSIS — E119 Type 2 diabetes mellitus without complications: Secondary | ICD-10-CM | POA: Diagnosis not present

## 2022-04-17 DIAGNOSIS — R262 Difficulty in walking, not elsewhere classified: Secondary | ICD-10-CM | POA: Diagnosis not present

## 2022-04-17 DIAGNOSIS — M1711 Unilateral primary osteoarthritis, right knee: Secondary | ICD-10-CM | POA: Diagnosis not present

## 2022-04-17 DIAGNOSIS — M25661 Stiffness of right knee, not elsewhere classified: Secondary | ICD-10-CM | POA: Diagnosis not present

## 2022-04-17 DIAGNOSIS — M6281 Muscle weakness (generalized): Secondary | ICD-10-CM | POA: Diagnosis not present

## 2022-04-19 LAB — HM DIABETES EYE EXAM

## 2022-04-20 ENCOUNTER — Other Ambulatory Visit: Payer: Self-pay | Admitting: Family Medicine

## 2022-04-20 DIAGNOSIS — F3289 Other specified depressive episodes: Secondary | ICD-10-CM | POA: Diagnosis not present

## 2022-04-20 DIAGNOSIS — I10 Essential (primary) hypertension: Secondary | ICD-10-CM

## 2022-04-20 DIAGNOSIS — F419 Anxiety disorder, unspecified: Secondary | ICD-10-CM | POA: Diagnosis not present

## 2022-04-20 NOTE — Telephone Encounter (Signed)
Left message asking patient if she needs these prior to 11/6 appt.

## 2022-04-21 DIAGNOSIS — M1711 Unilateral primary osteoarthritis, right knee: Secondary | ICD-10-CM | POA: Diagnosis not present

## 2022-04-21 DIAGNOSIS — M25661 Stiffness of right knee, not elsewhere classified: Secondary | ICD-10-CM | POA: Diagnosis not present

## 2022-04-21 DIAGNOSIS — R262 Difficulty in walking, not elsewhere classified: Secondary | ICD-10-CM | POA: Diagnosis not present

## 2022-04-21 DIAGNOSIS — M6281 Muscle weakness (generalized): Secondary | ICD-10-CM | POA: Diagnosis not present

## 2022-04-24 DIAGNOSIS — M6281 Muscle weakness (generalized): Secondary | ICD-10-CM | POA: Diagnosis not present

## 2022-04-24 DIAGNOSIS — M1711 Unilateral primary osteoarthritis, right knee: Secondary | ICD-10-CM | POA: Diagnosis not present

## 2022-04-24 DIAGNOSIS — R262 Difficulty in walking, not elsewhere classified: Secondary | ICD-10-CM | POA: Diagnosis not present

## 2022-04-24 DIAGNOSIS — M25661 Stiffness of right knee, not elsewhere classified: Secondary | ICD-10-CM | POA: Diagnosis not present

## 2022-04-27 DIAGNOSIS — F419 Anxiety disorder, unspecified: Secondary | ICD-10-CM | POA: Diagnosis not present

## 2022-04-27 DIAGNOSIS — F3289 Other specified depressive episodes: Secondary | ICD-10-CM | POA: Diagnosis not present

## 2022-04-28 ENCOUNTER — Encounter: Payer: Self-pay | Admitting: *Deleted

## 2022-04-28 DIAGNOSIS — M1711 Unilateral primary osteoarthritis, right knee: Secondary | ICD-10-CM | POA: Diagnosis not present

## 2022-04-28 DIAGNOSIS — M6281 Muscle weakness (generalized): Secondary | ICD-10-CM | POA: Diagnosis not present

## 2022-04-28 DIAGNOSIS — R262 Difficulty in walking, not elsewhere classified: Secondary | ICD-10-CM | POA: Diagnosis not present

## 2022-04-28 DIAGNOSIS — M25661 Stiffness of right knee, not elsewhere classified: Secondary | ICD-10-CM | POA: Diagnosis not present

## 2022-04-28 NOTE — Progress Notes (Unsigned)
HPI female never smoker followed for OSA, complicated by obesity, GERD, HBP Followed by Dr. Chase Caller for asthma NPSG 04/23/06- AHI 42/ hr, desat to 89% ---------------------------------------------------------------------------    04/29/21- 56 year old female never smoker followed for OSA, complicated by Obesity, GERD, HBP, Asthma, Hyperlipidemia, Depression, R Breast CA,  Endometriosis, Covid infection [id number],  Followed by Dr. Chase Caller for moderate persistent asthma, on Arnuity CPAP auto 4-15/ [employer]-AirSense 11 AutoSet   Download compliance- 100%, AHI 0.9/ hr Body weight today- 253 lbs Covid vax- 3 Phizer Flu vax-  -----Doing good with CPAP Tough year- breast cancer, knee arthroscopy, Covid infection. Doing well now.  No concerns with CPAP.  04/30/22- 56 year old female never smoker followed for OSA, complicated by Obesity, GERD, HBP, Asthma, Hyperlipidemia, Depression, R Breast CA,  Endometriosis, Covid infection [id number],  Followed by Dr. Chase Caller for moderate persistent asthma, on Arnuity CPAP auto 4-15/ [employer]-AirSense 11 AutoSet   Download compliance-  Body weight today-  Covid vax- 3 Phizer Flu vax-     ROS-see HPI   + = positive Constitutional:    weight loss, night sweats, fevers, chills, fatigue, lassitude. HEENT:    headaches, difficulty swallowing, tooth/dental problems, sore throat,       sneezing, itching, ear ache, nasal congestion, post nasal drip, snoring CV:    chest pain, orthopnea, PND, swelling in lower extremities, anasarca,                                                       dizziness, palpitations Resp:   shortness of breath with exertion or at rest.                productive cough,   non-productive cough, coughing up of blood.              change in color of mucus.  wheezing.   Skin:    rash or lesions. GI:  No-   heartburn, indigestion, abdominal pain, nausea, vomiting, diarrhea,                 change in bowel habits, loss of appetite GU:  dysuria, change in color of urine, no urgency or frequency.   flank pain. MS:   joint pain, stiffness, decreased range of motion, back pain. Neuro-     nothing unusual Psych:  change in mood or affect.  depression or anxiety.   memory loss.  OBJ- Physical Exam General- Alert, Oriented, Affect-appropriate, Distress- none acute + obese Skin- rash-none, lesions- none, excoriation- none Lymphadenopathy- none Head- atraumatic            Eyes- Gross vision intact, PERRLA, conjunctivae and secretions clear            Ears- Hearing, canals-normal            Nose- Clear, no-Septal dev, mucus, polyps, erosion, perforation             Throat- Mallampati III-IV , mucosa clear , drainage- none, tonsils- atrophic Neck- flexible , trachea midline, no stridor , thyroid nl, carotid no bruit Chest - symmetrical excursion , unlabored           Heart/CV- RRR , no murmur , no gallop  , no rub, nl s1 s2                           -  JVD- none , edema- none, stasis changes- none, varices- none           Lung- clear to P&A, wheeze- none, cough- none , dullness-none, rub- none           Chest wall-  Abd-  Br/ Gen/ Rectal- Not done, not indicated Extrem- cyanosis- none, clubbing, none, atrophy- none, strength- nl Neuro- grossly intact to observation

## 2022-04-29 DIAGNOSIS — M1711 Unilateral primary osteoarthritis, right knee: Secondary | ICD-10-CM | POA: Diagnosis not present

## 2022-04-30 ENCOUNTER — Encounter: Payer: Self-pay | Admitting: Internal Medicine

## 2022-04-30 ENCOUNTER — Ambulatory Visit (INDEPENDENT_AMBULATORY_CARE_PROVIDER_SITE_OTHER): Payer: BC Managed Care – PPO | Admitting: Internal Medicine

## 2022-04-30 VITALS — BP 140/72 | HR 93 | Ht 64.0 in | Wt 210.4 lb

## 2022-04-30 DIAGNOSIS — G4733 Obstructive sleep apnea (adult) (pediatric): Secondary | ICD-10-CM

## 2022-04-30 DIAGNOSIS — J4520 Mild intermittent asthma, uncomplicated: Secondary | ICD-10-CM | POA: Diagnosis not present

## 2022-04-30 NOTE — Patient Instructions (Signed)
We can continue CPAP auto 5-12  Ok to refill asthma meds when needed  Please call if we can help

## 2022-04-30 NOTE — Assessment & Plan Note (Signed)
Benefits from CPAP. Weight loss has helped. Plan- continue auto 5-12

## 2022-04-30 NOTE — Assessment & Plan Note (Signed)
Mild intermittent well-controlled Plan- continue current meds. We can refill as needed.

## 2022-05-01 DIAGNOSIS — M1711 Unilateral primary osteoarthritis, right knee: Secondary | ICD-10-CM | POA: Diagnosis not present

## 2022-05-01 DIAGNOSIS — R262 Difficulty in walking, not elsewhere classified: Secondary | ICD-10-CM | POA: Diagnosis not present

## 2022-05-01 DIAGNOSIS — M25661 Stiffness of right knee, not elsewhere classified: Secondary | ICD-10-CM | POA: Diagnosis not present

## 2022-05-01 DIAGNOSIS — M6281 Muscle weakness (generalized): Secondary | ICD-10-CM | POA: Diagnosis not present

## 2022-05-03 NOTE — Progress Notes (Unsigned)
No chief complaint on file.  Hypertension.  She is currently taking 69m losartan, 12.524mHCTZ and amlodipine 7.49m58mShe is tolerating this without side effects.   She denies headaches, dizziness (occ spinning type, never lightheaded), chest pain, shortness of breath, muscle cramps (only if not well hydrated), edema. BP's are not checked at home.  BP Readings from Last 3 Encounters:  04/30/22 (!) 140/72  03/17/22 137/74  03/06/22 (!) 144/83     Mixed hyperlipidemia--She continues on atorvastatin 71m57mily and denies side effects.  She is taking OTC omega-3 fish oil 2000mg40m (Vascepa and Lovaza weren't covered by her insurance).  She tries to follow a lowfat diet.  Lab Results  Component Value Date   CHOL 187 04/30/2021   HDL 46 04/30/2021   LDLCALC 101 (H) 04/30/2021   TRIG 236 (H) 04/30/2021   CHOLHDL 4.1 04/30/2021    Lipids in 04/2021 were notable for LDL 101, HDL 46, TG 236. These were last checked at bariatric clinic in May--LDL 84, TC 152, TG 293, HDL 39, ratio 3.9. K+ was low at 3.0, and mildly elevated AST. A1c was 6.6%.  Hasn't had labs since May with them.   [facility]  Last visit 10/23 with counselor.  She has been successful in losing some weight. She had prediabetes for many years, A1c went up to 6.5% in 10/2021, and up to 6.6% when checked shortly after by the bariatric clinic.   She is currently on Mounjaro and Metformin. She is doing well, denies side effects.  Wt Readings from Last 3 Encounters:  04/30/22 210 lb 6.4 oz (95.4 kg)  03/17/22 222 lb 6.4 oz (100.9 kg)  03/06/22 222 lb 6.4 oz (100.9 kg)    She underwent R total knee arthroplasty in September by Dr. MurphPercell Millere continues to get PT, and is progressing nicely.  She is under the care of pulmonary for asthma and OSA. She is doing well on Arnuity, not needing rescue inhaler.  She recently saw Dr. YoungAnnamaria Bootssleep apnea, doing well (better since weight loss) on  CPAP.  Breast cancer--last saw oncologist in June, doing well on anastrozole. Tolerating side effects. Last mammogram was last month.  Chronic back pain: Tizanidine at night helps (pain sometimes interferes with her sleep). Pain is tolerable. Spreads to buttocks, not into legs.   Depression: She continues to see psychiatrist and counselor. She remains on Pristiq and doing well, feeling like she is doing 75-80%.  Dr. Kaur Toy Careced she seemed tired, and Sunosi samples were given. Patient is concerned due to potential effect on BP.   UPDATE***    PMH, PSH, SH reviewed    ROS: no fever, chills, URI symptoms.  Breathing is at baseline. No chest pain, palpitations, edema No GI or urinary issues. No bleeding, bruising, rash  +intentional weight loss Knee pain s/p TKR Back pain, chronic, manageable  Moods   PHYSICAL EXAM:  LMP 01/27/2009 (Exact Date)   Wt Readings from Last 3 Encounters:  04/30/22 210 lb 6.4 oz (95.4 kg)  03/17/22 222 lb 6.4 oz (100.9 kg)  03/06/22 222 lb 6.4 oz (100.9 kg)      Pleasant female in no distress HEENT: conjunctiva and sclera are clear, EOMI. OP clear Neck: no lymphadenopathy, thyromegaly or mass, no bruit Heart: regular rate and rhythm, no murmur Lungs: clear, no wheezing Back: no CVA tenderness. No spinal tenderness, muscle spasm Abdomen: soft, obese, nontender Extremities: no edema, normal pulses Skin: normal  turgor, no visible rash Neuro: alert and oriented, normal gait Psych:  Normal eye contact, speech, hygiene and grooming   ASSESSMENT/PLAN:  Flu and COVID booster (if no recent COVID infection)  I think she is also on Mounjaro now (from bariatric clinic) Did she ever try the [facility] from Dr. Toy Care, taking?  Not sure when lipids are going to be rechecked (TG high in May)--through bariatrics, or here?? ?A1c ?c-met Unsure when these are being recheck by [facility].

## 2022-05-04 ENCOUNTER — Ambulatory Visit (INDEPENDENT_AMBULATORY_CARE_PROVIDER_SITE_OTHER): Payer: BC Managed Care – PPO | Admitting: Family Medicine

## 2022-05-04 ENCOUNTER — Encounter: Payer: Self-pay | Admitting: Family Medicine

## 2022-05-04 ENCOUNTER — Encounter: Payer: Self-pay | Admitting: *Deleted

## 2022-05-04 VITALS — BP 110/68 | HR 80 | Ht 64.0 in | Wt 214.0 lb

## 2022-05-04 DIAGNOSIS — E782 Mixed hyperlipidemia: Secondary | ICD-10-CM | POA: Diagnosis not present

## 2022-05-04 DIAGNOSIS — E1159 Type 2 diabetes mellitus with other circulatory complications: Secondary | ICD-10-CM

## 2022-05-04 DIAGNOSIS — Z23 Encounter for immunization: Secondary | ICD-10-CM | POA: Diagnosis not present

## 2022-05-04 DIAGNOSIS — I152 Hypertension secondary to endocrine disorders: Secondary | ICD-10-CM

## 2022-05-04 DIAGNOSIS — E118 Type 2 diabetes mellitus with unspecified complications: Secondary | ICD-10-CM | POA: Diagnosis not present

## 2022-05-04 DIAGNOSIS — R252 Cramp and spasm: Secondary | ICD-10-CM

## 2022-05-04 DIAGNOSIS — R7989 Other specified abnormal findings of blood chemistry: Secondary | ICD-10-CM

## 2022-05-04 DIAGNOSIS — I10 Essential (primary) hypertension: Secondary | ICD-10-CM

## 2022-05-04 DIAGNOSIS — C50411 Malignant neoplasm of upper-outer quadrant of right female breast: Secondary | ICD-10-CM

## 2022-05-04 DIAGNOSIS — Z96651 Presence of right artificial knee joint: Secondary | ICD-10-CM

## 2022-05-04 DIAGNOSIS — Z170 Estrogen receptor positive status [ER+]: Secondary | ICD-10-CM

## 2022-05-04 DIAGNOSIS — G8929 Other chronic pain: Secondary | ICD-10-CM

## 2022-05-04 DIAGNOSIS — E1169 Type 2 diabetes mellitus with other specified complication: Secondary | ICD-10-CM | POA: Diagnosis not present

## 2022-05-04 DIAGNOSIS — E785 Hyperlipidemia, unspecified: Secondary | ICD-10-CM

## 2022-05-04 DIAGNOSIS — Z5181 Encounter for therapeutic drug level monitoring: Secondary | ICD-10-CM

## 2022-05-04 DIAGNOSIS — E876 Hypokalemia: Secondary | ICD-10-CM

## 2022-05-04 DIAGNOSIS — Z6836 Body mass index (BMI) 36.0-36.9, adult: Secondary | ICD-10-CM

## 2022-05-04 DIAGNOSIS — F339 Major depressive disorder, recurrent, unspecified: Secondary | ICD-10-CM

## 2022-05-04 DIAGNOSIS — Z6841 Body Mass Index (BMI) 40.0 and over, adult: Secondary | ICD-10-CM

## 2022-05-04 DIAGNOSIS — M545 Low back pain, unspecified: Secondary | ICD-10-CM

## 2022-05-04 MED ORDER — AMLODIPINE BESYLATE 5 MG PO TABS: ORAL_TABLET | ORAL | 1 refills | Status: DC

## 2022-05-04 MED ORDER — DICLOFENAC SODIUM 75 MG PO TBEC: DELAYED_RELEASE_TABLET | ORAL | 0 refills | Status: DC

## 2022-05-05 ENCOUNTER — Encounter: Payer: Self-pay | Admitting: Family Medicine

## 2022-05-05 DIAGNOSIS — M1711 Unilateral primary osteoarthritis, right knee: Secondary | ICD-10-CM | POA: Diagnosis not present

## 2022-05-05 DIAGNOSIS — M25661 Stiffness of right knee, not elsewhere classified: Secondary | ICD-10-CM | POA: Diagnosis not present

## 2022-05-05 DIAGNOSIS — Z713 Dietary counseling and surveillance: Secondary | ICD-10-CM | POA: Diagnosis not present

## 2022-05-05 DIAGNOSIS — R262 Difficulty in walking, not elsewhere classified: Secondary | ICD-10-CM | POA: Diagnosis not present

## 2022-05-05 DIAGNOSIS — Z6835 Body mass index (BMI) 35.0-35.9, adult: Secondary | ICD-10-CM | POA: Diagnosis not present

## 2022-05-05 DIAGNOSIS — E669 Obesity, unspecified: Secondary | ICD-10-CM | POA: Diagnosis not present

## 2022-05-05 DIAGNOSIS — M6281 Muscle weakness (generalized): Secondary | ICD-10-CM | POA: Diagnosis not present

## 2022-05-08 ENCOUNTER — Other Ambulatory Visit: Payer: BC Managed Care – PPO

## 2022-05-08 DIAGNOSIS — Z5181 Encounter for therapeutic drug level monitoring: Secondary | ICD-10-CM | POA: Diagnosis not present

## 2022-05-08 DIAGNOSIS — E876 Hypokalemia: Secondary | ICD-10-CM | POA: Diagnosis not present

## 2022-05-08 DIAGNOSIS — E782 Mixed hyperlipidemia: Secondary | ICD-10-CM

## 2022-05-08 DIAGNOSIS — E1169 Type 2 diabetes mellitus with other specified complication: Secondary | ICD-10-CM

## 2022-05-08 DIAGNOSIS — E785 Hyperlipidemia, unspecified: Secondary | ICD-10-CM | POA: Diagnosis not present

## 2022-05-08 DIAGNOSIS — R252 Cramp and spasm: Secondary | ICD-10-CM

## 2022-05-08 DIAGNOSIS — M1711 Unilateral primary osteoarthritis, right knee: Secondary | ICD-10-CM | POA: Diagnosis not present

## 2022-05-08 DIAGNOSIS — R7989 Other specified abnormal findings of blood chemistry: Secondary | ICD-10-CM | POA: Diagnosis not present

## 2022-05-08 DIAGNOSIS — M6281 Muscle weakness (generalized): Secondary | ICD-10-CM | POA: Diagnosis not present

## 2022-05-08 DIAGNOSIS — M25661 Stiffness of right knee, not elsewhere classified: Secondary | ICD-10-CM | POA: Diagnosis not present

## 2022-05-08 DIAGNOSIS — R262 Difficulty in walking, not elsewhere classified: Secondary | ICD-10-CM | POA: Diagnosis not present

## 2022-05-09 LAB — COMPREHENSIVE METABOLIC PANEL
ALT: 27 IU/L (ref 0–32)
AST: 27 IU/L (ref 0–40)
Albumin/Globulin Ratio: 1.7 (ref 1.2–2.2)
Albumin: 5 g/dL — ABNORMAL HIGH (ref 3.8–4.9)
Alkaline Phosphatase: 131 IU/L — ABNORMAL HIGH (ref 44–121)
BUN/Creatinine Ratio: 20 (ref 9–23)
BUN: 18 mg/dL (ref 6–24)
Bilirubin Total: 0.3 mg/dL (ref 0.0–1.2)
CO2: 24 mmol/L (ref 20–29)
Calcium: 10.4 mg/dL — ABNORMAL HIGH (ref 8.7–10.2)
Chloride: 104 mmol/L (ref 96–106)
Creatinine, Ser: 0.91 mg/dL (ref 0.57–1.00)
Globulin, Total: 3 g/dL (ref 1.5–4.5)
Glucose: 96 mg/dL (ref 70–99)
Potassium: 4.3 mmol/L (ref 3.5–5.2)
Sodium: 149 mmol/L — ABNORMAL HIGH (ref 134–144)
Total Protein: 8 g/dL (ref 6.0–8.5)
eGFR: 73 mL/min/{1.73_m2} (ref >59)

## 2022-05-09 LAB — LIPID PANEL
Chol/HDL Ratio: 3.1 ratio (ref 0.0–4.4)
Cholesterol, Total: 128 mg/dL (ref 100–199)
HDL: 41 mg/dL (ref >39)
LDL Chol Calc (NIH): 61 mg/dL (ref 0–99)
Triglycerides: 151 mg/dL — ABNORMAL HIGH (ref 0–149)
VLDL Cholesterol Cal: 26 mg/dL (ref 5–40)

## 2022-05-09 LAB — MAGNESIUM: Magnesium: 2.1 mg/dL (ref 1.6–2.3)

## 2022-05-09 MED ORDER — ATORVASTATIN CALCIUM 80 MG PO TABS: ORAL_TABLET | ORAL | 3 refills | Status: DC

## 2022-05-11 DIAGNOSIS — F3289 Other specified depressive episodes: Secondary | ICD-10-CM | POA: Diagnosis not present

## 2022-05-11 DIAGNOSIS — F419 Anxiety disorder, unspecified: Secondary | ICD-10-CM | POA: Diagnosis not present

## 2022-05-12 ENCOUNTER — Encounter: Payer: Self-pay | Admitting: Internal Medicine

## 2022-05-12 DIAGNOSIS — M6281 Muscle weakness (generalized): Secondary | ICD-10-CM | POA: Diagnosis not present

## 2022-05-12 DIAGNOSIS — R262 Difficulty in walking, not elsewhere classified: Secondary | ICD-10-CM | POA: Diagnosis not present

## 2022-05-12 DIAGNOSIS — M1711 Unilateral primary osteoarthritis, right knee: Secondary | ICD-10-CM | POA: Diagnosis not present

## 2022-05-12 DIAGNOSIS — M25661 Stiffness of right knee, not elsewhere classified: Secondary | ICD-10-CM | POA: Diagnosis not present

## 2022-05-14 DIAGNOSIS — M1711 Unilateral primary osteoarthritis, right knee: Secondary | ICD-10-CM | POA: Diagnosis not present

## 2022-05-14 DIAGNOSIS — M25661 Stiffness of right knee, not elsewhere classified: Secondary | ICD-10-CM | POA: Diagnosis not present

## 2022-05-14 DIAGNOSIS — M6281 Muscle weakness (generalized): Secondary | ICD-10-CM | POA: Diagnosis not present

## 2022-05-14 DIAGNOSIS — R262 Difficulty in walking, not elsewhere classified: Secondary | ICD-10-CM | POA: Diagnosis not present

## 2022-05-18 DIAGNOSIS — E119 Type 2 diabetes mellitus without complications: Secondary | ICD-10-CM | POA: Diagnosis not present

## 2022-05-18 DIAGNOSIS — E669 Obesity, unspecified: Secondary | ICD-10-CM | POA: Diagnosis not present

## 2022-05-18 DIAGNOSIS — F419 Anxiety disorder, unspecified: Secondary | ICD-10-CM | POA: Diagnosis not present

## 2022-05-18 DIAGNOSIS — I10 Essential (primary) hypertension: Secondary | ICD-10-CM | POA: Diagnosis not present

## 2022-05-18 DIAGNOSIS — F3289 Other specified depressive episodes: Secondary | ICD-10-CM | POA: Diagnosis not present

## 2022-05-18 DIAGNOSIS — F54 Psychological and behavioral factors associated with disorders or diseases classified elsewhere: Secondary | ICD-10-CM | POA: Diagnosis not present

## 2022-05-19 DIAGNOSIS — M25661 Stiffness of right knee, not elsewhere classified: Secondary | ICD-10-CM | POA: Diagnosis not present

## 2022-05-19 DIAGNOSIS — M1711 Unilateral primary osteoarthritis, right knee: Secondary | ICD-10-CM | POA: Diagnosis not present

## 2022-05-19 DIAGNOSIS — M6281 Muscle weakness (generalized): Secondary | ICD-10-CM | POA: Diagnosis not present

## 2022-05-19 DIAGNOSIS — R262 Difficulty in walking, not elsewhere classified: Secondary | ICD-10-CM | POA: Diagnosis not present

## 2022-05-25 DIAGNOSIS — F3289 Other specified depressive episodes: Secondary | ICD-10-CM | POA: Diagnosis not present

## 2022-05-25 DIAGNOSIS — F419 Anxiety disorder, unspecified: Secondary | ICD-10-CM | POA: Diagnosis not present

## 2022-05-26 DIAGNOSIS — G4733 Obstructive sleep apnea (adult) (pediatric): Secondary | ICD-10-CM | POA: Diagnosis not present

## 2022-05-26 DIAGNOSIS — M25661 Stiffness of right knee, not elsewhere classified: Secondary | ICD-10-CM | POA: Diagnosis not present

## 2022-05-26 DIAGNOSIS — M1711 Unilateral primary osteoarthritis, right knee: Secondary | ICD-10-CM | POA: Diagnosis not present

## 2022-05-26 DIAGNOSIS — M6281 Muscle weakness (generalized): Secondary | ICD-10-CM | POA: Diagnosis not present

## 2022-05-26 DIAGNOSIS — R262 Difficulty in walking, not elsewhere classified: Secondary | ICD-10-CM | POA: Diagnosis not present

## 2022-05-26 NOTE — Progress Notes (Unsigned)
No chief complaint on file.  Patient presents with complaint of back pain and epigastric pain.  Back pain  Chronic back pain: She had bilateral RF ablation in July, with good results.  She continues on gabapentin for pain (from back, and neuropathy in toes).  Her back flared up with the CPM machine after her knee surgery.  She got toradol injection, a steroid shot and a prednisone pack, and she had improved at the time of her last visit here, earlier this month. At that time she had been using methocarbamol since knee surgery. Previously took Tizanidine for her back.  Epigastric pain  She has been taking voltaren 8m BID, last refilled earlier this month UPDATE Daily or BID?  She takes omeprazole 280mdaily.   PMH, PSH, SH reviewed    PHYSICAL EXAM:  LMP 01/27/2009 (Exact Date)   Wt Readings from Last 3 Encounters:  05/04/22 214 lb (97.1 kg)  04/30/22 210 lb 6.4 oz (95.4 kg)  03/17/22 222 lb 6.4 oz (100.9 kg)      ASSESSMENT/PLAN:

## 2022-05-27 ENCOUNTER — Ambulatory Visit (INDEPENDENT_AMBULATORY_CARE_PROVIDER_SITE_OTHER): Payer: BC Managed Care – PPO | Admitting: Family Medicine

## 2022-05-27 ENCOUNTER — Encounter: Payer: Self-pay | Admitting: Family Medicine

## 2022-05-27 VITALS — BP 110/70 | HR 80 | Temp 98.3°F | Ht 64.0 in | Wt 210.6 lb

## 2022-05-27 DIAGNOSIS — R1010 Upper abdominal pain, unspecified: Secondary | ICD-10-CM | POA: Diagnosis not present

## 2022-05-27 DIAGNOSIS — R109 Unspecified abdominal pain: Secondary | ICD-10-CM | POA: Diagnosis not present

## 2022-05-27 LAB — POCT URINALYSIS DIP (PROADVANTAGE DEVICE)
Bilirubin, UA: NEGATIVE
Blood, UA: NEGATIVE
Glucose, UA: NEGATIVE mg/dL
Ketones, POC UA: NEGATIVE mg/dL
Nitrite, UA: NEGATIVE
Protein Ur, POC: NEGATIVE mg/dL
Specific Gravity, Urine: 1.01
Urobilinogen, Ur: 0.2
pH, UA: 7 (ref 5.0–8.0)

## 2022-05-27 NOTE — Patient Instructions (Signed)
Ice after activity if pain flares, otherwise try heat for the more chronic "ache". Do some gentle stretches. Start doing some core exercises to strengthen the core.  Stop using the ibuprofen.  You may use the diclofenac twice daily if the pain is moderate to severe.  Use tylenol if mild to moderate pain. I think the anti-inflammatories might be bothering your stomach some, so trying to cut back will help. Double up on your omeprazole (5m daily) for the next week or so, until your central upper stomach tenderness resolves. You can use the tizanidine more than once a day, if needed.  You definitely have spasm in your rhomboid muscles (next to the shoulder blade).

## 2022-05-28 IMAGING — MG MM BREAST LOCALIZATION CLIP
4 series · 4 of 12 positions shown · non-contrast
Comparison: Previous exam(s).

CLINICAL DATA: Status post ultrasound-guided core biopsy of RIGHT
breast mass.

EXAM:
3D DIAGNOSTIC RIGHT MAMMOGRAM POST ULTRASOUND BIOPSY

[R ML synth-2D]
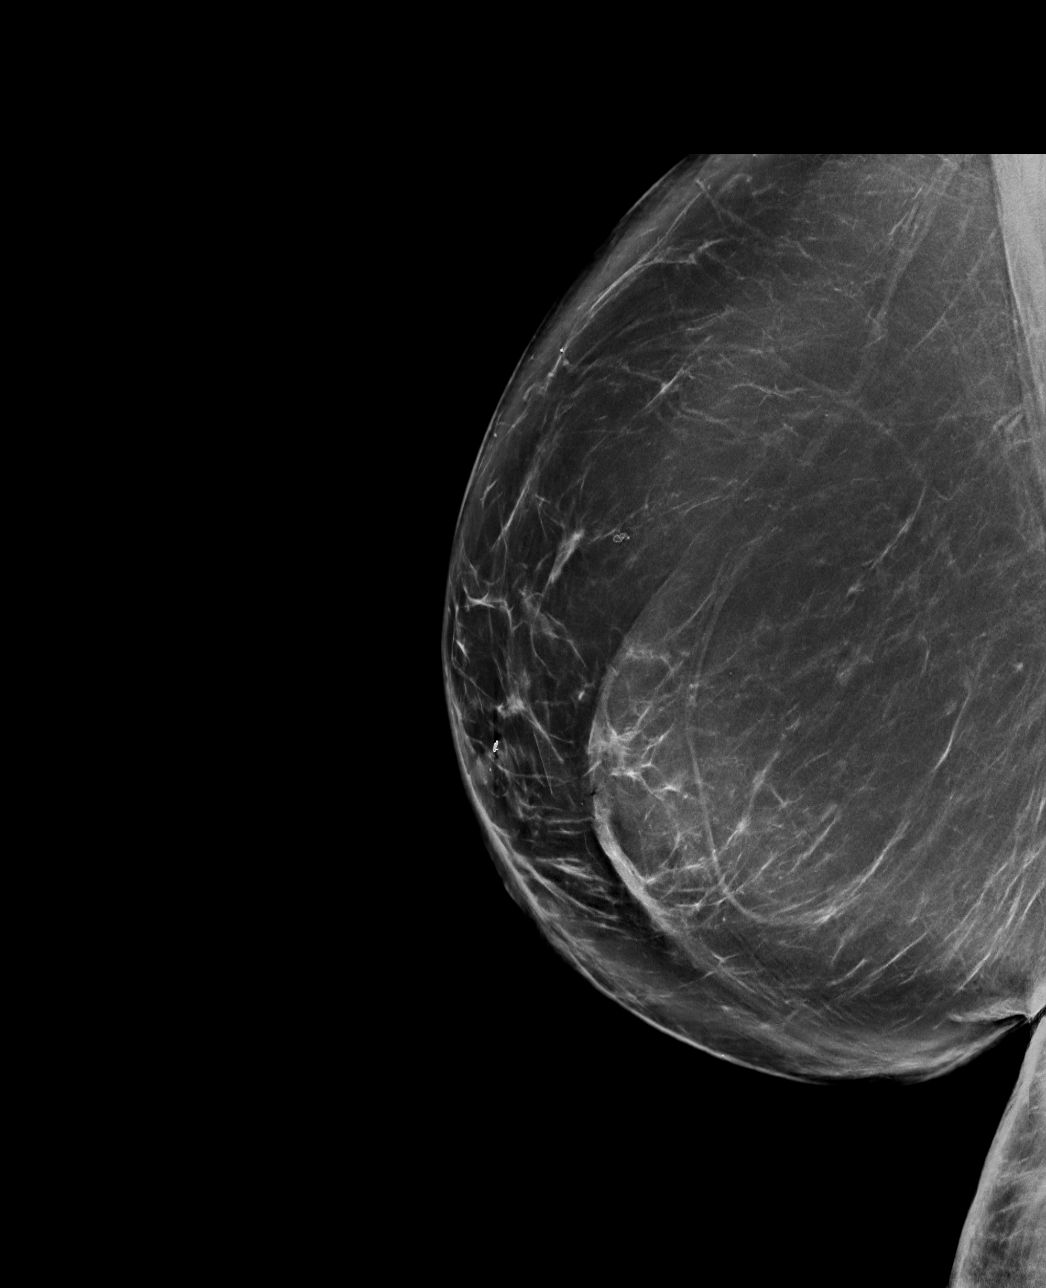

[R CC synth-2D]
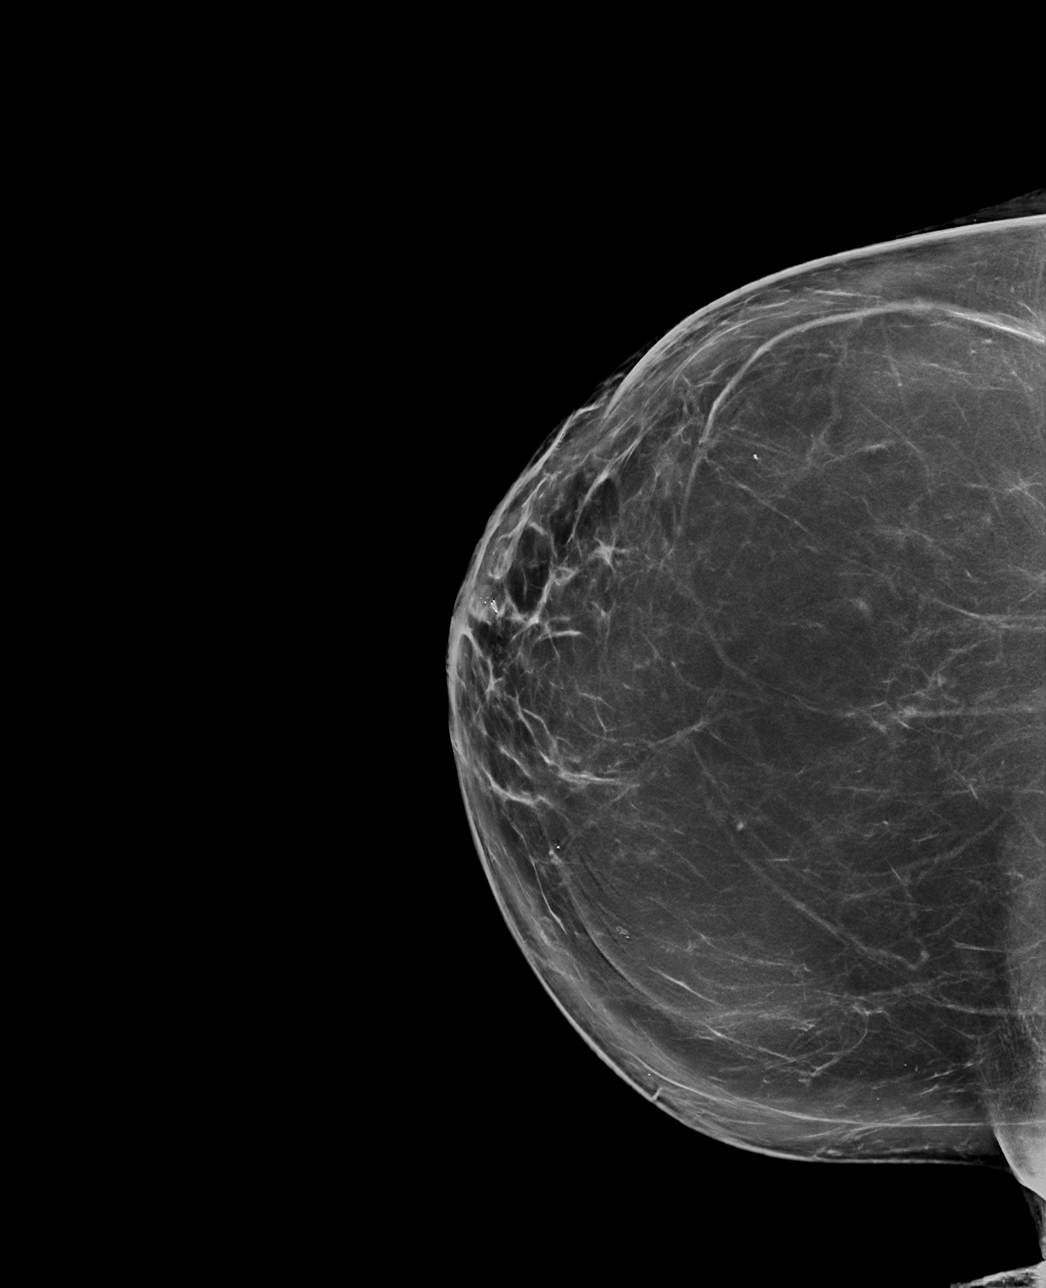

[R CC tomo · tomo slice 54/107.0]
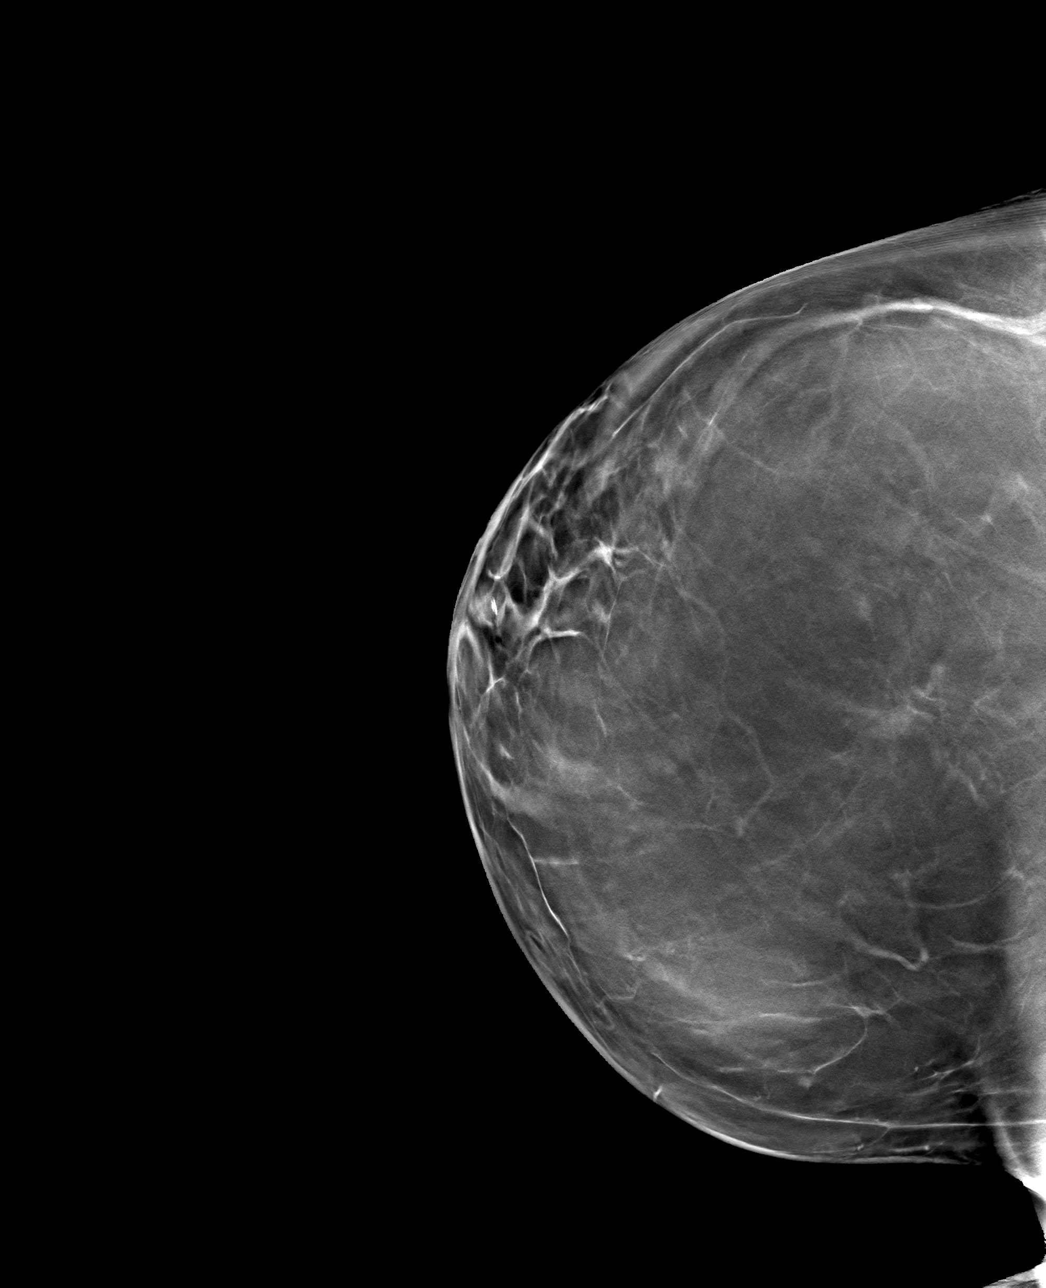

[R ML tomo · tomo slice 57/112.0]
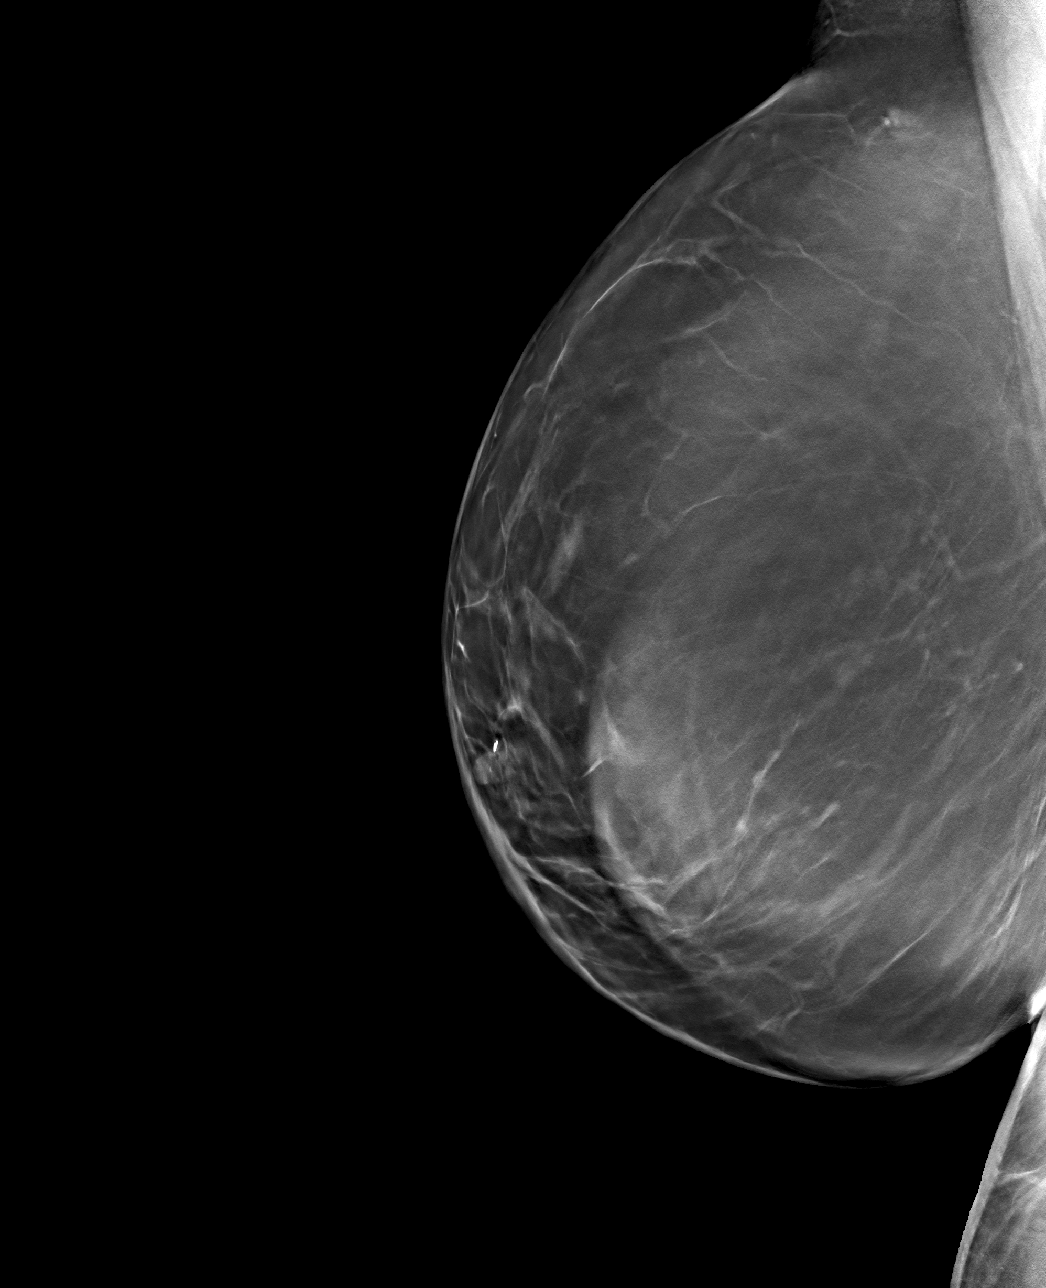

[4 of 12 positions shown; findings below may reference images not displayed]

FINDINGS: 3D Mammographic images were obtained following ultrasound guided
biopsy of mass in the 12 o'clock retroareolar region of the RIGHT
breast and placement of a heart shaped clip. The biopsy marking clip
is in expected position at the site of biopsy.
IMPRESSION: Appropriate positioning of the heart shaped biopsy marking clip at
the site of biopsy in the mass in the 12 o'clock retroareolar region
of the RIGHT breast.

Final Assessment: Post Procedure Mammograms for Marker Placement

## 2022-05-28 IMAGING — US US  BREAST BX W/ LOC DEV 1ST LESION IMG BX SPEC US GUIDE*R*
1 series · 10 of 10 positions shown · non-contrast
Comparison: Previous exam(s).
COMPARISON: Previous exam(s).

Addendum:
CLINICAL DATA: Patient presents for ultrasound-guided core biopsy
of RIGHT breast mass.

EXAM:
ULTRASOUND GUIDED RIGHT BREAST CORE NEEDLE BIOPSY

[Series 1: us breast bx w/ loc dev 1st lesion img bx spec us  · 0.06mm/px · 10 of 10 slices shown]
[im 1/10]
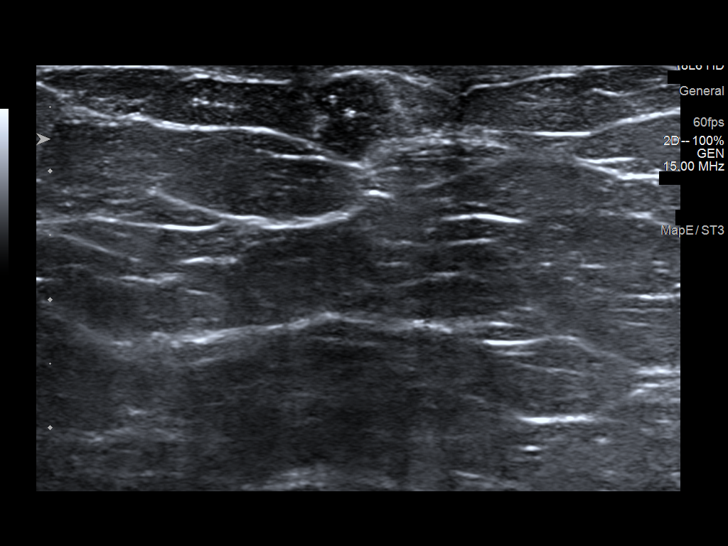
[im 2/10]
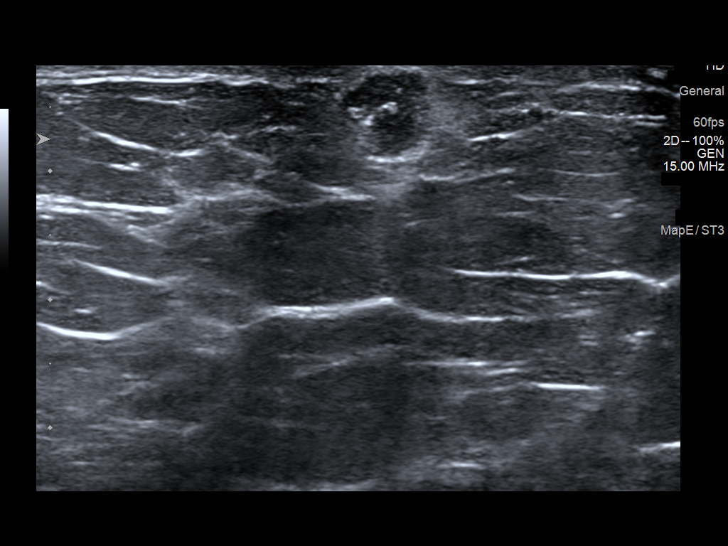
[im 3/10]
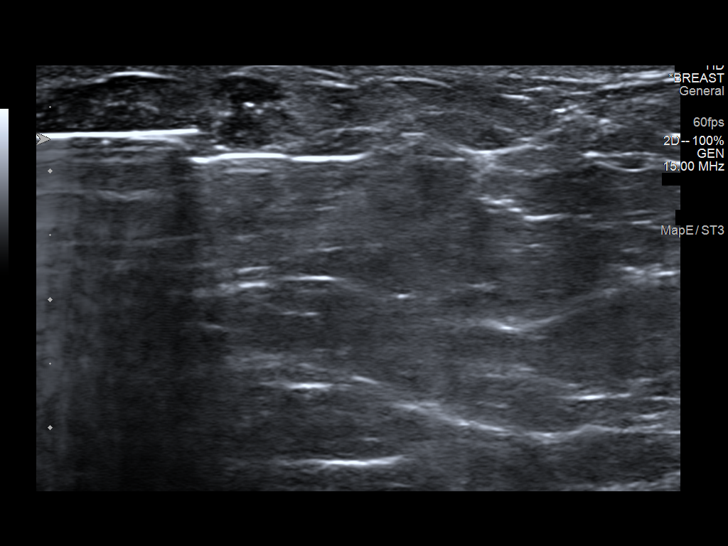
[im 4/10]
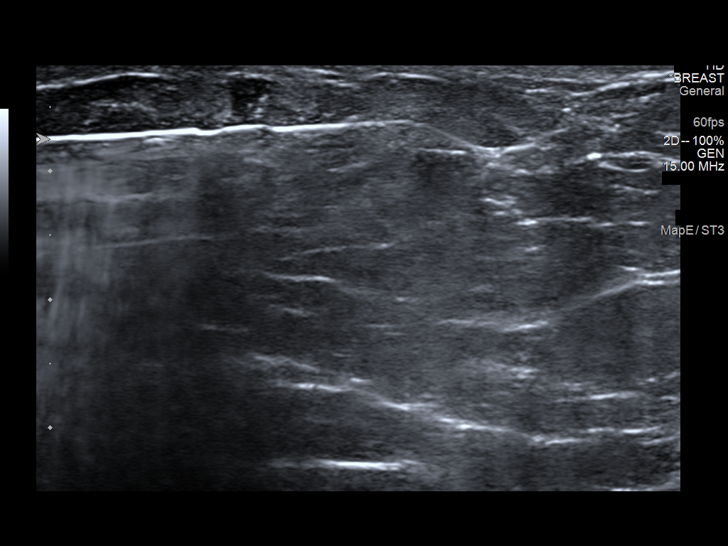
[im 5/10]
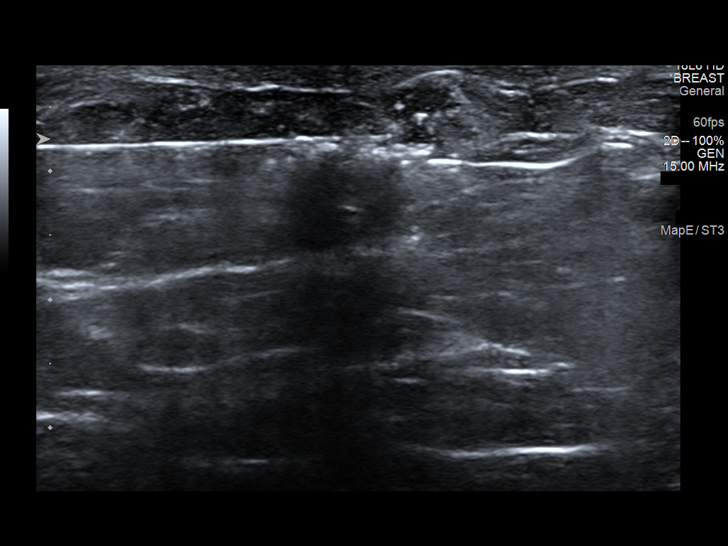
[im 6/10]
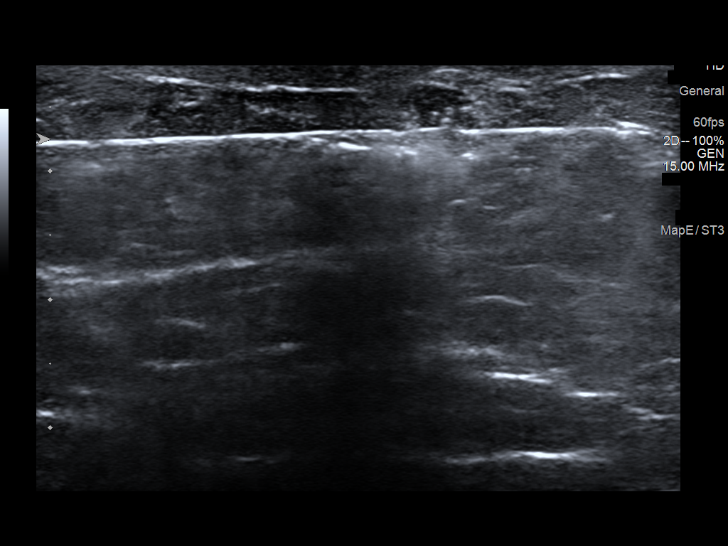
[im 7/10]
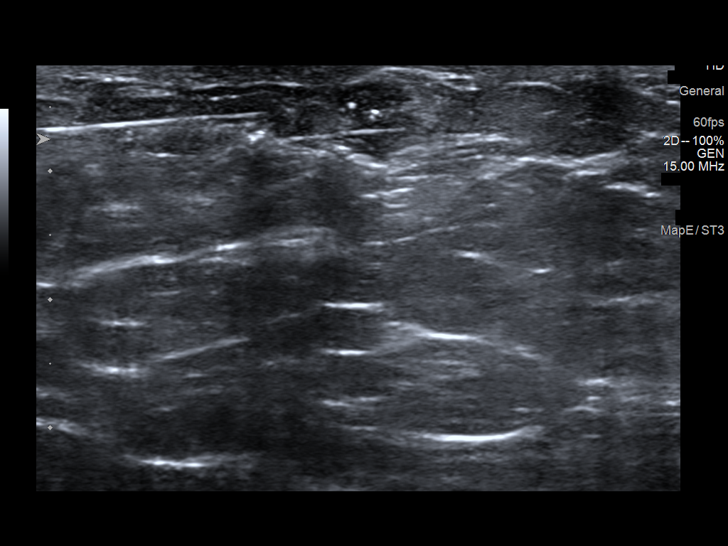
[im 8/10]
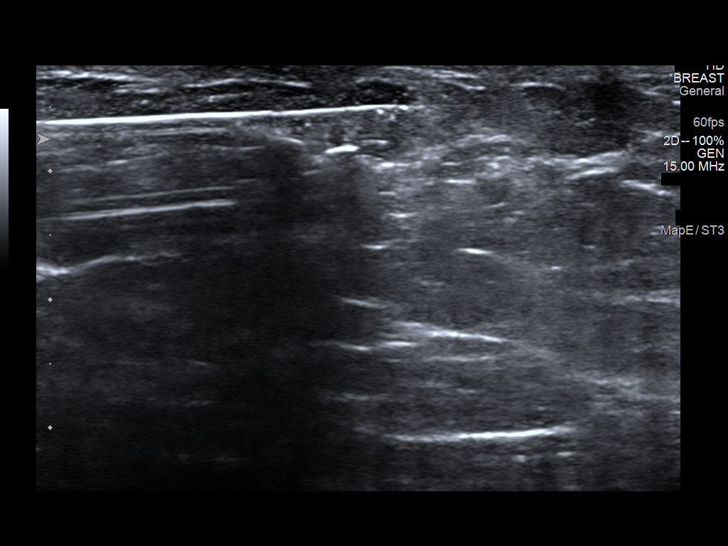
[im 9/10]
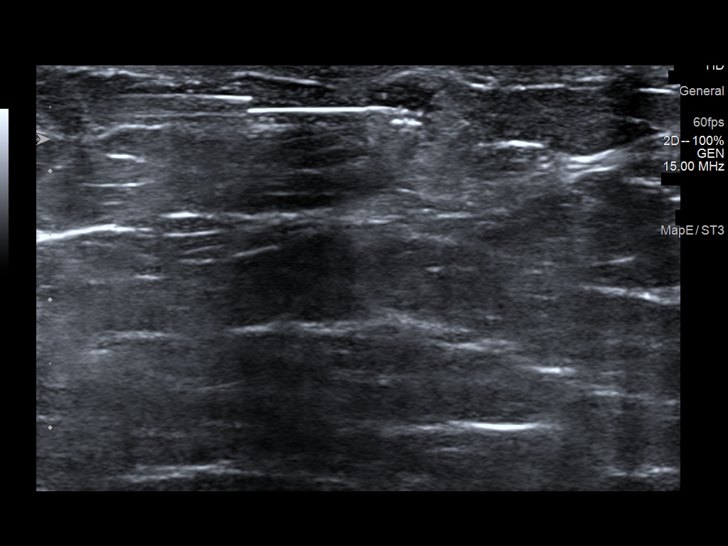
[im 10/10]
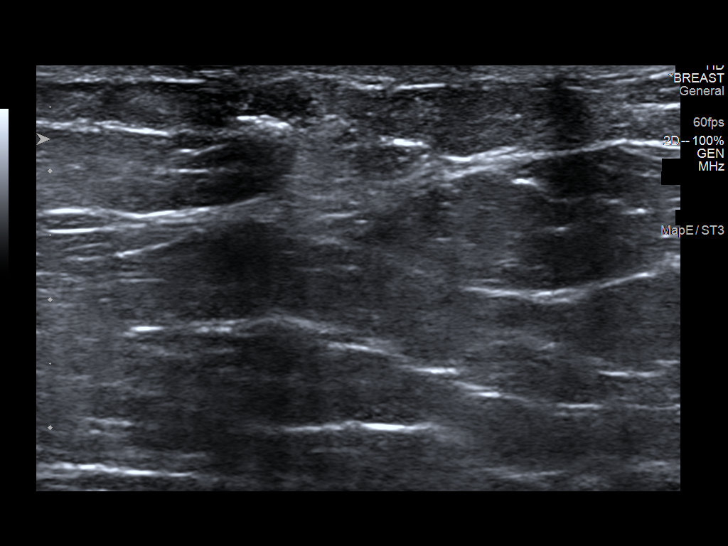

[10 of 10 positions shown; findings below may reference images not displayed]



Lesion quadrant: 12 o'clock retroareolar RIGHT breast

Using sterile technique and 1% Lidocaine as local anesthetic, under
direct ultrasound visualization, a 12 gauge coaxial Delrio
device was used to perform biopsy of mass in the 12 o'clock
retroareolar region of the RIGHT breast using a LATERAL to MEDIAL
approach. At the conclusion of the procedure heart shaped tissue
marker clip was deployed into the biopsy cavity. Follow up 2 view
mammogram was performed and dictated separately.
IMPRESSION: Ultrasound guided biopsy of RIGHT breast mass. No apparent
complications.

ADDENDUM:
Pathology revealed GRADE II INVASIVE DUCTAL CARCINOMA, DUCTAL
PAPILLOMA of the RIGHT breast, 12:00 o'clock. This was found to be
concordant by Dr. Ur Mendizabal.

Pathology results were discussed with the patient by telephone. The
patient reported doing well after the biopsy with tenderness at the
site. Post biopsy instructions and care were reviewed and questions
were answered. The patient was encouraged to call The [REDACTED] for any additional concerns. My direct phone
number was provided.

The patient was referred to [REDACTED]
[REDACTED] at [REDACTED] on
September 11, 2020.

Pathology results reported by Christian Roberth Siu, RN on 09/03/2020.



Lesion quadrant: 12 o'clock retroareolar RIGHT breast

Using sterile technique and 1% Lidocaine as local anesthetic, under
direct ultrasound visualization, a 12 gauge coaxial Delrio
device was used to perform biopsy of mass in the 12 o'clock
retroareolar region of the RIGHT breast using a LATERAL to MEDIAL
approach. At the conclusion of the procedure heart shaped tissue
marker clip was deployed into the biopsy cavity. Follow up 2 view
mammogram was performed and dictated separately.
IMPRESSION: Ultrasound guided biopsy of RIGHT breast mass. No apparent
complications.

## 2022-06-01 DIAGNOSIS — F3289 Other specified depressive episodes: Secondary | ICD-10-CM | POA: Diagnosis not present

## 2022-06-01 DIAGNOSIS — F419 Anxiety disorder, unspecified: Secondary | ICD-10-CM | POA: Diagnosis not present

## 2022-06-08 ENCOUNTER — Ambulatory Visit: Payer: BC Managed Care – PPO | Attending: Surgery

## 2022-06-08 VITALS — Wt 210.1 lb

## 2022-06-08 DIAGNOSIS — Z483 Aftercare following surgery for neoplasm: Secondary | ICD-10-CM | POA: Insufficient documentation

## 2022-06-08 DIAGNOSIS — F419 Anxiety disorder, unspecified: Secondary | ICD-10-CM | POA: Diagnosis not present

## 2022-06-08 DIAGNOSIS — F3289 Other specified depressive episodes: Secondary | ICD-10-CM | POA: Diagnosis not present

## 2022-06-08 NOTE — Therapy (Signed)
OUTPATIENT PHYSICAL THERAPY SOZO SCREENING NOTE   Patient Name: Susan Gould MRN: [id number] DOB:1965-07-07, 56 y.o., female Today's Date: 02/23/2022  PCP: Rita Ohara, MD REFERRING PROVIDER: [name], MD   PT End of Session - 02/23/22 [phone]     Visit Number 2   # unchanged due to screen only   PT Start Time 0923    PT Stop Time 0933    PT Time Calculation (min) 10 min    Activity Tolerance Patient tolerated treatment well    Behavior During Therapy [facility] for tasks assessed/performed             Past Medical History:  Diagnosis Date   Allergic rhinitis, cause unspecified    cat/dog/ragweed/mold/wool, s/p immunotherapy x 3-4 yrs   Anxiety 2006   Asthma    (Dr. Joya Gaskins)   Breast cancer [facility]) 06/2020   Colon polyps 2007   Dr. Oletta Lamas (she was told repeat in 10 years)   COVID-19 virus infection 12/2020   Depression 2006   Endometriosis    Dr. Leo Grosser   Family history of breast cancer in female    GERD (gastroesophageal reflux disease)    Heart murmur    Hemorrhoid    internal and external   History of radiation therapy 12/06/2020   right breast 11/07/2020-12/06/2020  Dr Gery Pray   Hyperlipidemia    Hypertension    Insulin resistance    Dr. Leo Grosser   Medial meniscus tear    Left. Surgery [name].  NOT seen on MRI 06/2013   Obesity    OSA (obstructive sleep apnea)    on CPAP (Dr. Gwenette Greet)   Past Surgical History:  Procedure Laterality Date   BREAST LUMPECTOMY WITH SENTINEL LYMPH NODE BIOPSY Right 10/03/2020   Procedure: RIGHT BREAST LUMPECTOMY WITH SENTINEL LYMPH NODE BIOPSY;  Surgeon: [name], MD;  Location: [place];  Service: General;  Laterality: Right;   CARPAL TUNNEL RELEASE Right 12/28/2019   Dr. Percell Miller   CARPAL TUNNEL RELEASE Left 02/22/2020   [place] CYST EXCISION  2003   RIGHT 3rd finger   JOINT REPLACEMENT  2017   KNEE ARTHROSCOPY Right 04/17/2021   Dr.  Fredonia Highland (torn meniscus)   KNEE SURGERY  '95 and '97   BILATERAL KNEE   LAPAROSCOPY  '98, '01   endometriosis (Dr. Leo Grosser)   [place]   tibial tuberoplasty  90's   left   Patient Active Problem List   Diagnosis Date Noted   Malignant neoplasm of upper-outer quadrant of right breast in female, estrogen receptor positive ([place]) 09/09/2020   Steatohepatitis 07/25/2019   Acute exacerbation of moderate persistent extrinsic asthma 06/02/2016   Class 3 obesity due to excess calories with serious comorbidity and body mass index (BMI) of 45.0 to 49.9 in adult 04/23/2016   Pre-operative clearance 09/23/2015   Mixed hyperlipidemia 07/29/2011   OA (osteoarthritis) of knee 07/29/2011   Morbid obesity with body mass index of 45.0-49.9 in adult [facility]) 03/17/2010   Insulin resistance syndrome 11/13/2009   HYPERLIPIDEMIA 11/13/2009   Anxiety state 11/13/2009   Essential hypertension 11/13/2009   Mild intermittent asthma 11/13/2009   GERD 11/13/2009   ENDOMETRIOSIS 11/13/2009   OSA (obstructive sleep apnea) 11/13/2009    REFERRING DIAG: right breast cancer at risk for lymphedema  THERAPY DIAG: Aftercare following surgery for neoplasm  PERTINENT HISTORY: Patient was diagnosed on 09/02/2020 with right grade II invasive ductal  carcinoma breast cancer. She underwent a right lumpectomy and sentinel node biopsy (1 negative) on 10/03/2020. It is ER/PR positive and HER2 negative with a Ki67 of 30%.   PRECAUTIONS: right UE Lymphedema risk, None  SUBJECTIVE: Pt returns for her 3 month L-Dex screen.   PAIN:  Are you having pain? No  SOZO SCREENING: Patient was assessed today using the SOZO machine to determine the lymphedema index score. This was compared to her baseline score. It was determined that she is within the recommended range when compared to her baseline and no further action is needed at this time. She will continue SOZO screenings. These are done every 3 months for 2 years post operatively  followed by every 6 months for 2 years, and then annually.   L-DEX FLOWSHEETS - 02/23/22 0900       L-DEX LYMPHEDEMA SCREENING   Measurement Type Unilateral    L-DEX MEASUREMENT EXTREMITY Upper Extremity    POSITION  Standing    DOMINANT SIDE Right    At Risk Side Right    BASELINE SCORE (UNILATERAL) -1.2    L-DEX SCORE (UNILATERAL) -0.8    VALUE CHANGE (UNILAT) 0.4              Otelia Limes, PTA 02/23/2022, 9:34 AM

## 2022-06-10 ENCOUNTER — Other Ambulatory Visit: Payer: Self-pay | Admitting: Family Medicine

## 2022-06-10 DIAGNOSIS — I10 Essential (primary) hypertension: Secondary | ICD-10-CM

## 2022-06-15 DIAGNOSIS — F419 Anxiety disorder, unspecified: Secondary | ICD-10-CM | POA: Diagnosis not present

## 2022-06-15 DIAGNOSIS — F3289 Other specified depressive episodes: Secondary | ICD-10-CM | POA: Diagnosis not present

## 2022-06-17 ENCOUNTER — Inpatient Hospital Stay: Payer: BC Managed Care – PPO | Attending: Hematology and Oncology | Admitting: Hematology and Oncology

## 2022-06-17 VITALS — BP 136/66 | HR 91 | Temp 97.8°F | Resp 16 | Ht 64.0 in | Wt 209.9 lb

## 2022-06-17 DIAGNOSIS — Z923 Personal history of irradiation: Secondary | ICD-10-CM | POA: Diagnosis not present

## 2022-06-17 DIAGNOSIS — Z170 Estrogen receptor positive status [ER+]: Secondary | ICD-10-CM | POA: Diagnosis not present

## 2022-06-17 DIAGNOSIS — C50411 Malignant neoplasm of upper-outer quadrant of right female breast: Secondary | ICD-10-CM | POA: Insufficient documentation

## 2022-06-17 DIAGNOSIS — Z79899 Other long term (current) drug therapy: Secondary | ICD-10-CM | POA: Insufficient documentation

## 2022-06-17 NOTE — Progress Notes (Signed)
[place]  [phone] [phone]     ID: Susan Gould DOB: [birth year]  MR#: [id number]  MVH#:[id number]  Patient Care Team: Rita Ohara, MD as PCP - General (Family Medicine) [name], MD as Consulting Physician (General Surgery) Gery Pray, MD as Consulting Physician (Radiation Oncology) Laroy Apple, MD as Consulting Physician (Physical Medicine and Rehabilitation) Everett Graff, MD as Consulting Physician (Obstetrics and Gynecology) Arta Silence, MD as Consulting Physician (Gastroenterology) Deneise Lever, MD as Consulting Physician (Pulmonary Disease) Danice Goltz, MD as Consulting Physician (Ophthalmology) Benay Pike, MD as Consulting Physician (Hematology and Oncology) Benay Pike, MD OTHER MD:  CHIEF COMPLAINT: estrogen receptor positive breast cancer  CURRENT TREATMENT: anastrozole  INTERVAL HISTORY: She had a total knee replacement on the right in September, PT went well, healing well. Most recent mammogram, no mammographic evidence of malignancy, annual diagnostic mammogram recommended. Last bone density January 2023, normal. She is tolerating anastrazole very well.  She denies any new complaints No new changes in the breast. No change in breathing or bowel or ueinary habits No new neurologic complaints. Weight loss of 50 lbs, she is very happy with it. Rest of the pertinent 10 point ROS reviewed and negative.  REVIEW OF SYSTEMS: Review of Systems  Constitutional:  Negative for appetite change, chills, fatigue, fever and unexpected weight change.  HENT:   Negative for hearing loss, lump/mass and trouble swallowing.   Eyes:  Negative for eye problems and icterus.  Respiratory:  Negative for chest tightness, cough and shortness of breath.   Cardiovascular:  Negative for chest pain, leg swelling and palpitations.  Gastrointestinal:  Negative for abdominal distention, abdominal pain,  constipation, diarrhea, nausea and vomiting.  Endocrine: Positive for hot flashes (occasional).  Genitourinary:  Negative for difficulty urinating.   Musculoskeletal:  Negative for arthralgias.  Skin:  Negative for itching and rash.  Neurological:  Negative for dizziness, extremity weakness, headaches and numbness.  Hematological:  Negative for adenopathy. Does not bruise/bleed easily.  Psychiatric/Behavioral:  Negative for depression. The patient is not nervous/anxious.      COVID 19 VACCINATION STATUS: Pfizer x4, most recently 09/2020; infection 12/2020   HISTORY OF CURRENT ILLNESS: From the original intake note:  Susan Gould herself palpated a right breast abnormality. Of note, screening mammogram on 05/31/2020 was negative. She underwent right diagnostic mammography with tomography and right breast ultrasonography at [facility] on 08/29/2020 showing: breast density category B; palpable indeterminate 0.6 cm mass in right breast at 12 o'clock retroareolar region; right axilla negative for adenopathy.  Accordingly on 09/02/2020 she proceeded to biopsy of the right breast area in question. The pathology from this procedure ([id number]) showed: invasive ductal carcinoma, grade 2; ductal papilloma. Prognostic indicators significant for: estrogen receptor, >95% positive with strong staining intensity and progesterone receptor, 15% positive with moderate staining intensity. Proliferation marker Ki67 at 30%. HER2 equivocal by immunohistochemistry (2+), but negative by fluorescent in situ hybridization with a signals ratio 0.92 and number per cell 1.7.   Cancer Staging  Malignant neoplasm of upper-outer quadrant of right breast in female, estrogen receptor positive ([place]) Staging form: Breast, AJCC 8th Edition - Clinical stage from 09/11/2020: Stage IA (cT1b, cN0, cM0, G2, ER+, PR+, HER2-) - Signed by Chauncey Cruel, MD on 09/11/2020 Stage prefix: Initial diagnosis Histologic grading  system: 3 grade system Laterality: Right Staged by: Pathologist and managing physician Stage used in treatment planning: Yes National guidelines used in treatment planning: Yes Type of national guideline  used in treatment planning: NCCN  The patient's subsequent history is as detailed below.   PAST MEDICAL HISTORY: Past Medical History:  Diagnosis Date   Allergic rhinitis, cause unspecified    cat/dog/ragweed/mold/wool, s/p immunotherapy x 3-4 yrs   Anemia    Anxiety 2006   Asthma    (Dr. Joya Gaskins)   Breast cancer [facility]) 06/2020   Colon polyps 2007   Dr. Oletta Lamas (she was told repeat in 10 years)   COVID-19 virus infection 12/2020   DDD (degenerative disc disease), lumbosacral    Depression 2006   Endometriosis    Dr. Leo Grosser   Family history of breast cancer in female    Fatty liver    GERD (gastroesophageal reflux disease)    Heart murmur    Hemorrhoid    internal and external   History of radiation therapy 12/06/2020   right breast 11/07/2020-12/06/2020  Dr Gery Pray   Hyperlipidemia    Hypertension    Insulin resistance    Dr. Leo Grosser   Medial meniscus tear    Left. Surgery [name].  NOT seen on MRI 06/2013   Obesity    OSA (obstructive sleep apnea)    on CPAP (Dr. Gwenette Greet)    PAST SURGICAL HISTORY: Past Surgical History:  Procedure Laterality Date   BREAST LUMPECTOMY WITH SENTINEL LYMPH NODE BIOPSY Right 10/03/2020   Procedure: RIGHT BREAST LUMPECTOMY WITH SENTINEL LYMPH NODE BIOPSY;  Surgeon: [name], MD;  Location: [place];  Service: General;  Laterality: Right;   CARPAL TUNNEL RELEASE Right 12/28/2019   Dr. Percell Miller   CARPAL TUNNEL RELEASE Left 02/22/2020   [place] CYST EXCISION  2003   RIGHT 3rd finger   JOINT REPLACEMENT  2017   KNEE ARTHROSCOPY Right 04/17/2021   Dr. Fredonia Highland (torn meniscus)   KNEE SURGERY  '95 and '97   BILATERAL KNEE   LAPAROSCOPY  '98, '01    endometriosis (Dr. Leo Grosser)   [place] Bilateral 12/2021   for back pain   tibial tuberoplasty  90's   left   TOTAL KNEE ARTHROPLASTY Right 03/17/2022   Procedure: RIGHT TOTAL KNEE ARTHROPLASTY;  Surgeon: Renette Butters, MD;  Location[facility];  Service: Orthopedics;  Laterality: Right;    FAMILY HISTORY: Family History  Problem Relation Age of Onset   Diabetes Mother    Hypertension Mother    Hyperlipidemia Mother    Arthritis Mother    Cancer Mother 43       breast cancer   Breast cancer Mother 41   Diabetes Father    Hyperlipidemia Father    Hypertension Father    Asthma Father    Arthritis Father    Heart disease Father        CHF, hypokinesis   COPD Father        due to asbestos exposure; former smoker   Pulmonary embolism Father    Parkinson's disease Father    Hypertension Sister    Colon cancer Maternal Grandmother        late 60's   Cancer Maternal Grandmother        colon   Heart disease Maternal Grandmother        MI in 42's   Breast cancer Paternal Aunt        70's   Cancer Paternal Aunt        breast   Heart disease  Paternal Uncle    Diabetes Paternal Uncle    Kidney disease Paternal Uncle   Her father died at age 52 from COPD and Parkinson's. Her mother died at age 39 from a perforated colon. Susan Gould has one brother and three sisters, all living as of 08/2020. She reports breast cancer in her mother at age 35 and in a paternal aunt. She also notes colon cancer in her maternal grandmother in her early 75's.    GYNECOLOGIC HISTORY:  Patient's last menstrual period was 01/27/2009 (exact date). Menarche: 56 years old [place] P 0 LMP 01/2009 Contraceptive: used "for years" and stopped in the early 2000's. HRT never used  Hysterectomy? no BSO? 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 She is single. She lives by herself, with her  cat.  She is not a Ambulance person.    ADVANCED DIRECTIVES: not in place; believes she has named her brother Xandrea Clarey, who lives in [place] and can be reached at [phone]. Her sister [name] seconds.   HEALTH MAINTENANCE: Social History   Tobacco Use   Smoking status: Never   Smokeless tobacco: Never  Vaping Use   Vaping Use: Never used  Substance Use Topics   Alcohol use: Yes    Alcohol/week: 0.0 standard drinks of alcohol    Comment: 1-2 drinks a month   Drug use: No     Colonoscopy: 05/2018 (Dr. Oletta Lamas), normal, recall 2024 for family history  PAP: 06/2020, negative  Bone density: 03/2015, +0.5   Allergies  Allergen Reactions   Mobic [Meloxicam] Itching and Rash    Current Outpatient Medications  Medication Sig Dispense Refill   acetaminophen (TYLENOL) 650 MG CR tablet Take 1,300 mg by mouth every 8 (eight) hours as needed for pain. (Patient not taking: Reported on 05/27/2022)     albuterol (VENTOLIN HFA) 108 (90 Base) MCG/ACT inhaler Inhale 2 puffs into the lungs every 6 (six) hours as needed for wheezing or shortness of breath. (Patient not taking: Reported on 05/04/2022) 8 g 2   ALPRAZolam (XANAX) 0.5 MG tablet TAKE 1/2 -1 TABLET BY MOUTH 3 TIMES DAILY AS NEEDED FOR ANXIETY (Patient not taking: Reported on 05/27/2022) 20 tablet 0   amLODipine (NORVASC) 5 MG tablet TAKE 1 AND 1/2 TABLETS(7.5 MG) BY MOUTH DAILY 135 tablet 1   anastrozole (ARIMIDEX) 1 MG tablet Take 1 tablet (1 mg total) by mouth daily. Start December 31, 2020 90 tablet 4   ARNUITY ELLIPTA 100 MCG/ACT AEPB INHALE 1 PUFF INTO THE LUNGS DAILY 30 each 3   atorvastatin (LIPITOR) 80 MG tablet TAKE 1 TABLET(80 MG) BY MOUTH DAILY 90 tablet 3   betamethasone dipropionate 0.05 % lotion Apply 1 Application topically daily as needed (irritation).     CALCIUM PO Take 1,000 mg by mouth daily.     cholecalciferol (VITAMIN D3) 25 MCG (1000 UNIT) tablet Take 1,000 Units by mouth daily.     desvenlafaxine (PRISTIQ) 100 MG  24 hr tablet Take 100 mg by mouth daily.     diclofenac (VOLTAREN) 75 MG EC tablet TAKE 1 TABLET(75 MG) BY MOUTH TWICE DAILY WITH FOOD AS NEEDED FOR MILD PAIN OR MODERATE PAIN 60 tablet 0   fluticasone (FLONASE) 50 MCG/ACT nasal spray INSTILL 1 TO 2 SPRAYS INTO EACH NOSTRIL ONCE A DAY 16 g 11   gabapentin (NEURONTIN) 300 MG capsule TAKE 1 CAPSULE(300 MG) BY MOUTH TWICE DAILY 180 capsule 0  hydrochlorothiazide (MICROZIDE) 12.5 MG capsule TAKE 1 CAPSULE(12.5 MG) BY MOUTH DAILY 90 capsule 1   ibuprofen (ADVIL) 600 MG tablet Take 600 mg by mouth every 6 (six) hours as needed.     L-Methylfolate-Algae (DEPLIN 15 PO) Take 1 capsule by mouth daily.     loratadine (CLARITIN) 10 MG tablet Take 10 mg by mouth daily.     losartan (COZAAR) 50 MG tablet TAKE 1 TABLET(50 MG) BY MOUTH DAILY 90 tablet 0   metFORMIN (GLUMETZA) 500 MG (MOD) 24 hr tablet Take 500 mg by mouth daily with breakfast.     Multiple Vitamins-Minerals (ONE-A-DAY MENS 50+ PO) Take 1 tablet by mouth daily.     Omega-3 Fatty Acids (FISH OIL) 1000 MG CAPS Take 2,000 mg by mouth in the morning and at bedtime.     omeprazole (PRILOSEC) 20 MG capsule Take 20 mg by mouth daily.     ondansetron (ZOFRAN-ODT) 4 MG disintegrating tablet Take 1 tablet (4 mg total) by mouth 2 (two) times daily as needed for nausea or vomiting. (Patient not taking: Reported on 05/04/2022) 10 tablet 0   polyethylene glycol (MIRALAX) 17 g packet Take 17 g by mouth daily. to prevent constipation (Patient not taking: Reported on 05/27/2022) 14 each 0   Semaglutide,0.25 or 0.5MG/DOS, (OZEMPIC, 0.25 OR 0.5 MG/DOSE,) 2 MG/1.5ML SOPN Inject 0.5 mg into the skin every Thursday.     vitamin B-12 (CYANOCOBALAMIN) 1000 MCG tablet Take 1,000 mcg by mouth daily.     vitamin C (ASCORBIC ACID) 500 MG tablet Take 500 mg by mouth daily.     No current facility-administered medications for this visit.    OBJECTIVE: White woman who appears stated age  There were no vitals filed for this  visit.     There is no height or weight on file to calculate BMI.   Wt Readings from Last 3 Encounters:  06/08/22 210 lb 2 oz (95.3 kg)  05/27/22 210 lb 9.6 oz (95.5 kg)  05/04/22 214 lb (97.1 kg)      ECOG FS:1 - Symptomatic but completely ambulatory   Physical Exam Constitutional:      Appearance: Normal appearance.  Chest:       Comments: Bilateral breasts inspected.  No palpable masses or regional adenopathy. Right breast periareolar scar noted.  Musculoskeletal:     Cervical back: Normal range of motion and neck supple. No rigidity.  Skin:    General: Skin is warm and dry.  Neurological:     Mental Status: She is alert.        LAB RESULTS:  CMP     Component Value Date/Time   NA 149 (H) 05/08/2022 0805   K 4.3 05/08/2022 0805   CL 104 05/08/2022 0805   CO2 24 05/08/2022 0805   GLUCOSE 96 05/08/2022 0805   GLUCOSE 99 03/06/2022 0918   BUN 18 05/08/2022 0805   CREATININE 0.91 05/08/2022 0805   CREATININE 1.04 (H) 09/11/2020 0830   CREATININE 0.97 03/09/2017 1110   CALCIUM 10.4 (H) 05/08/2022 0805   PROT 8.0 05/08/2022 0805   ALBUMIN 5.0 (H) 05/08/2022 0805   AST 27 05/08/2022 0805   AST 49 (H) 09/11/2020 0830   ALT 27 05/08/2022 0805   ALT 58 (H) 09/11/2020 0830   ALKPHOS 131 (H) 05/08/2022 0805   BILITOT 0.3 05/08/2022 0805   BILITOT 0.4 09/11/2020 0830   GFRNONAA >60 03/06/2022 0918   GFRNONAA >60 09/11/2020 0830   GFRAA 77 06/20/2020 1002  No results found for: "TOTALPROTELP", "ALBUMINELP", "A1GS", "A2GS", "BETS", "BETA2SER", "GAMS", "MSPIKE", "SPEI"  Lab Results  Component Value Date   WBC 6.5 03/06/2022   NEUTROABS 6.5 04/30/2021   HGB 13.4 03/06/2022   HCT 41.1 03/06/2022   MCV 93.0 03/06/2022   PLT 253 03/06/2022    No results found for: "LABCA2"  No components found for: "BTDVVO160"  No results for input(s): "INR" in the last 168 hours.  No results found for: "LABCA2"  No results found for: "VPX106"  No results found for:  "CAN125"  No results found for: "CAN153"  No results found for: "CA2729"  No components found for: "HGQUANT"  No results found for: "CEA1", "CEA" / No results found for: "CEA1", "CEA"   No results found for: "AFPTUMOR"  No results found for: "CHROMOGRNA"  No results found for: "KPAFRELGTCHN", "LAMBDASER", "KAPLAMBRATIO" (kappa/lambda light chains)  No results found for: "HGBA", "HGBA2QUANT", "HGBFQUANT", "HGBSQUAN" (Hemoglobinopathy evaluation)   No results found for: "LDH"  Lab Results  Component Value Date   IRON 60 05/03/2019   TIBC 316 05/03/2019   IRONPCTSAT 19 05/03/2019   (Iron and TIBC)  Lab Results  Component Value Date   FERRITIN 42 05/03/2019    Urinalysis    Component Value Date/Time   LABSPEC 1.010 05/27/2022 0933   BILIRUBINUR negative 05/27/2022 0933   BILIRUBINUR neg 03/12/2016 0854   KETONESUR negative 05/27/2022 0933   PROTEINUR negative 05/27/2022 0933   PROTEINUR neg 03/12/2016 0854   UROBILINOGEN negative 03/12/2016 0854   NITRITE Negative 05/27/2022 0933   NITRITE neg 03/12/2016 0854   LEUKOCYTESUR Small (1+) (A) 05/27/2022 0933    STUDIES: No results found.   ELIGIBLE FOR AVAILABLE RESEARCH PROTOCOL:no  ASSESSMENT: 56 y.o. [place] woman status post right breast upper outer quadrant biopsy 09/02/2020 for a clinical T1b N0, stage IA invasive ductal carcinoma, grade 2, estrogen and progesterone receptor positive, HER-2 not amplified, with an MIB-1 of 30%  (1) status post right lumpectomy and sentinel lymph node sampling 10/03/2020 for a pT1c pN0, stage IA invasive ductal carcinoma, grade 2, with negative margins  (a) a single right axillary lymph node was removed  (2) Oncotype score of 25 predicted a risk of recurrence outside the breast in the next 9 years of 12% if the patient's only systemic therapy is antiestrogens for 5 years.  It predicts a less than 1% chemotherapy benefit  (3) adjuvant radiation to be completed  12/06/2020  (4) to start anastrozole 12/31/2020  (a) DEXA scan 04/22/2015  (b) DEXA scan January 2023, normal  PLAN:  [place] is here today for follow-up of her estrogen positive breast cancer.   She has no clinical or radiographic sign of breast cancer recurrence.   She continues on anastrozole daily with manageable hot flashes.   Encouraged calcium/vitamin D supplementation as tolerated and weightbearing exercises for bone health. Encouraged weight loss, she is participating in a weight loss program and is doing quite well. She lost 50 lbs already. MM diag ordered for Oct 2024.  Total time spent: 20 minutes  *Total Encounter Time as defined by the Centers for Medicare and Medicaid Services includes, in addition to the face-to-face time of a patient visit (documented in the note above) non-face-to-face time: obtaining and reviewing outside history, ordering and reviewing medications, tests or procedures, care coordination (communications with other health care professionals or caregivers) and documentation in the medical record.

## 2022-07-02 DIAGNOSIS — C50911 Malignant neoplasm of unspecified site of right female breast: Secondary | ICD-10-CM | POA: Diagnosis not present

## 2022-07-06 DIAGNOSIS — F419 Anxiety disorder, unspecified: Secondary | ICD-10-CM | POA: Diagnosis not present

## 2022-07-06 DIAGNOSIS — F3289 Other specified depressive episodes: Secondary | ICD-10-CM | POA: Diagnosis not present

## 2022-07-08 ENCOUNTER — Other Ambulatory Visit: Payer: Self-pay | Admitting: Family Medicine

## 2022-07-08 DIAGNOSIS — G8929 Other chronic pain: Secondary | ICD-10-CM

## 2022-07-09 ENCOUNTER — Encounter: Payer: Self-pay | Admitting: Internal Medicine

## 2022-07-09 ENCOUNTER — Encounter: Payer: Self-pay | Admitting: Family Medicine

## 2022-07-09 DIAGNOSIS — F3342 Major depressive disorder, recurrent, in full remission: Secondary | ICD-10-CM | POA: Diagnosis not present

## 2022-07-09 DIAGNOSIS — G4700 Insomnia, unspecified: Secondary | ICD-10-CM | POA: Diagnosis not present

## 2022-07-09 DIAGNOSIS — F4322 Adjustment disorder with anxiety: Secondary | ICD-10-CM | POA: Diagnosis not present

## 2022-07-09 NOTE — Telephone Encounter (Signed)
Is this okay to refill? 

## 2022-07-09 NOTE — Telephone Encounter (Signed)
Those meds are fine if you are still tired despite good control of the sleep apnea. They are not a substitute for adequate sleep. Do you want to try one?

## 2022-07-09 NOTE — Telephone Encounter (Signed)
Mychart message sent by pt: Susan Gould [facility] (supporting Deneise Lever, MD)15 minutes ago (1:24 PM)    Hello!   Not sure if I asked this before, but my psychiatrist, Dr. Chucky May, has suggested that adding Sunsosi, provigil or nuvigil to my regimen might be beneficial for my OSA.     What are your thoughts on this?   Thanks so much!   JS     Dr. [name], please advise.

## 2022-07-10 ENCOUNTER — Encounter: Payer: Self-pay | Admitting: Family Medicine

## 2022-07-10 NOTE — Telephone Encounter (Signed)
New message sent by pt: Susan Gould [facility] (supporting Deneise Lever, MD)1 minute ago (9:14 AM)    I usually do get a good night's sleep, but Dr. Toy Care suggested I try one of these.  I don't recall her reasoning, and feel free to reach out to her,  but I am willing to try.  I would prefer a generic.    Thanks so much!   Susan Gould   Routing back to Dr. [name].

## 2022-07-10 NOTE — Telephone Encounter (Signed)
The medicines you listed are stimulants, taken in thee morning to treat daytime fatigue. If that is a problem I will send in one to try.

## 2022-07-13 DIAGNOSIS — F419 Anxiety disorder, unspecified: Secondary | ICD-10-CM | POA: Diagnosis not present

## 2022-07-13 DIAGNOSIS — F3289 Other specified depressive episodes: Secondary | ICD-10-CM | POA: Diagnosis not present

## 2022-07-15 DIAGNOSIS — Z713 Dietary counseling and surveillance: Secondary | ICD-10-CM | POA: Diagnosis not present

## 2022-07-15 DIAGNOSIS — E669 Obesity, unspecified: Secondary | ICD-10-CM | POA: Diagnosis not present

## 2022-07-15 DIAGNOSIS — Z6835 Body mass index (BMI) 35.0-35.9, adult: Secondary | ICD-10-CM | POA: Diagnosis not present

## 2022-07-20 DIAGNOSIS — F3289 Other specified depressive episodes: Secondary | ICD-10-CM | POA: Diagnosis not present

## 2022-07-20 DIAGNOSIS — F419 Anxiety disorder, unspecified: Secondary | ICD-10-CM | POA: Diagnosis not present

## 2022-07-23 ENCOUNTER — Other Ambulatory Visit: Payer: Self-pay | Admitting: Family Medicine

## 2022-07-23 DIAGNOSIS — I10 Essential (primary) hypertension: Secondary | ICD-10-CM

## 2022-07-27 DIAGNOSIS — F419 Anxiety disorder, unspecified: Secondary | ICD-10-CM | POA: Diagnosis not present

## 2022-07-27 DIAGNOSIS — F3289 Other specified depressive episodes: Secondary | ICD-10-CM | POA: Diagnosis not present

## 2022-08-03 DIAGNOSIS — F3289 Other specified depressive episodes: Secondary | ICD-10-CM | POA: Diagnosis not present

## 2022-08-03 DIAGNOSIS — F419 Anxiety disorder, unspecified: Secondary | ICD-10-CM | POA: Diagnosis not present

## 2022-08-10 DIAGNOSIS — F3289 Other specified depressive episodes: Secondary | ICD-10-CM | POA: Diagnosis not present

## 2022-08-10 DIAGNOSIS — D2262 Melanocytic nevi of left upper limb, including shoulder: Secondary | ICD-10-CM | POA: Diagnosis not present

## 2022-08-10 DIAGNOSIS — L821 Other seborrheic keratosis: Secondary | ICD-10-CM | POA: Diagnosis not present

## 2022-08-10 DIAGNOSIS — L649 Androgenic alopecia, unspecified: Secondary | ICD-10-CM | POA: Diagnosis not present

## 2022-08-10 DIAGNOSIS — D1721 Benign lipomatous neoplasm of skin and subcutaneous tissue of right arm: Secondary | ICD-10-CM | POA: Diagnosis not present

## 2022-08-10 DIAGNOSIS — F419 Anxiety disorder, unspecified: Secondary | ICD-10-CM | POA: Diagnosis not present

## 2022-08-12 DIAGNOSIS — Z01419 Encounter for gynecological examination (general) (routine) without abnormal findings: Secondary | ICD-10-CM | POA: Diagnosis not present

## 2022-08-12 DIAGNOSIS — Z124 Encounter for screening for malignant neoplasm of cervix: Secondary | ICD-10-CM | POA: Diagnosis not present

## 2022-08-12 DIAGNOSIS — Z8742 Personal history of other diseases of the female genital tract: Secondary | ICD-10-CM | POA: Diagnosis not present

## 2022-08-12 DIAGNOSIS — Z6835 Body mass index (BMI) 35.0-35.9, adult: Secondary | ICD-10-CM | POA: Diagnosis not present

## 2022-08-12 LAB — HM PAP SMEAR: HM Pap smear: NEGATIVE

## 2022-08-12 LAB — RESULTS CONSOLE HPV: CHL HPV: NEGATIVE

## 2022-08-13 DIAGNOSIS — G4733 Obstructive sleep apnea (adult) (pediatric): Secondary | ICD-10-CM | POA: Diagnosis not present

## 2022-08-13 DIAGNOSIS — F419 Anxiety disorder, unspecified: Secondary | ICD-10-CM | POA: Diagnosis not present

## 2022-08-13 DIAGNOSIS — E119 Type 2 diabetes mellitus without complications: Secondary | ICD-10-CM | POA: Diagnosis not present

## 2022-08-13 DIAGNOSIS — E669 Obesity, unspecified: Secondary | ICD-10-CM | POA: Diagnosis not present

## 2022-08-19 DIAGNOSIS — F3289 Other specified depressive episodes: Secondary | ICD-10-CM | POA: Diagnosis not present

## 2022-08-19 DIAGNOSIS — F419 Anxiety disorder, unspecified: Secondary | ICD-10-CM | POA: Diagnosis not present

## 2022-08-24 DIAGNOSIS — F3289 Other specified depressive episodes: Secondary | ICD-10-CM | POA: Diagnosis not present

## 2022-08-24 DIAGNOSIS — F419 Anxiety disorder, unspecified: Secondary | ICD-10-CM | POA: Diagnosis not present

## 2022-08-27 DIAGNOSIS — R1011 Right upper quadrant pain: Secondary | ICD-10-CM | POA: Diagnosis not present

## 2022-08-27 DIAGNOSIS — K7581 Nonalcoholic steatohepatitis (NASH): Secondary | ICD-10-CM | POA: Diagnosis not present

## 2022-08-27 DIAGNOSIS — K5900 Constipation, unspecified: Secondary | ICD-10-CM | POA: Diagnosis not present

## 2022-08-31 DIAGNOSIS — F3289 Other specified depressive episodes: Secondary | ICD-10-CM | POA: Diagnosis not present

## 2022-08-31 DIAGNOSIS — F419 Anxiety disorder, unspecified: Secondary | ICD-10-CM | POA: Diagnosis not present

## 2022-09-05 DIAGNOSIS — G4700 Insomnia, unspecified: Secondary | ICD-10-CM | POA: Diagnosis not present

## 2022-09-05 DIAGNOSIS — F3341 Major depressive disorder, recurrent, in partial remission: Secondary | ICD-10-CM | POA: Diagnosis not present

## 2022-09-05 DIAGNOSIS — F411 Generalized anxiety disorder: Secondary | ICD-10-CM | POA: Diagnosis not present

## 2022-09-07 ENCOUNTER — Ambulatory Visit: Payer: BC Managed Care – PPO | Attending: Surgery

## 2022-09-07 VITALS — Wt 208.2 lb

## 2022-09-07 DIAGNOSIS — F3289 Other specified depressive episodes: Secondary | ICD-10-CM | POA: Diagnosis not present

## 2022-09-07 DIAGNOSIS — F419 Anxiety disorder, unspecified: Secondary | ICD-10-CM | POA: Diagnosis not present

## 2022-09-07 DIAGNOSIS — Z483 Aftercare following surgery for neoplasm: Secondary | ICD-10-CM | POA: Insufficient documentation

## 2022-09-07 NOTE — Therapy (Signed)
OUTPATIENT PHYSICAL THERAPY SOZO SCREENING NOTE   Patient Name: Susan Gould MRN: [id number] DOB:Nov 08, 1965, 57 y.o., female Today's Date: 09/07/2022  PCP: Rita Ohara, MD REFERRING PROVIDER: [name], MD   PT End of Session - 09/07/22 0901     Visit Number 2   # unchanged due to screen only   PT Start Time 0859    PT Stop Time 0903    PT Time Calculation (min) 4 min    Activity Tolerance Patient tolerated treatment well    Behavior During Therapy [facility] for tasks assessed/performed             Past Medical History:  Diagnosis Date   Allergic rhinitis, cause unspecified    cat/dog/ragweed/mold/wool, s/p immunotherapy x 3-4 yrs   Anemia    Anxiety 2006   Asthma    (Dr. Joya Gaskins)   Breast cancer [facility]) 06/2020   Colon polyps 2007   Dr. Oletta Lamas (she was told repeat in 10 years)   COVID-19 virus infection 12/2020   DDD (degenerative disc disease), lumbosacral    Depression 2006   Endometriosis    Dr. Leo Grosser   Family history of breast cancer in female    Fatty liver    GERD (gastroesophageal reflux disease)    Heart murmur    Hemorrhoid    internal and external   History of radiation therapy 12/06/2020   right breast 11/07/2020-12/06/2020  Dr Gery Pray   Hyperlipidemia    Hypertension    Insulin resistance    Dr. Leo Grosser   Medial meniscus tear    Left. Surgery [name].  NOT seen on MRI 06/2013   Obesity    OSA (obstructive sleep apnea)    on CPAP (Dr. Gwenette Greet)   Past Surgical History:  Procedure Laterality Date   BREAST LUMPECTOMY WITH SENTINEL LYMPH NODE BIOPSY Right 10/03/2020   Procedure: RIGHT BREAST LUMPECTOMY WITH SENTINEL LYMPH NODE BIOPSY;  Surgeon: [name], MD;  Location: [place];  Service: General;  Laterality: Right;   CARPAL TUNNEL RELEASE Right 12/28/2019   Dr. Percell Miller   CARPAL TUNNEL RELEASE Left 02/22/2020   [place] CYST EXCISION  2003   RIGHT 3rd  finger   JOINT REPLACEMENT  2017   KNEE ARTHROSCOPY Right 04/17/2021   Dr. Fredonia Highland (torn meniscus)   KNEE SURGERY  '95 and '97   BILATERAL KNEE   LAPAROSCOPY  '98, '01   endometriosis (Dr. Leo Grosser)   [place] Bilateral 12/2021   for back pain   tibial tuberoplasty  90's   left   TOTAL KNEE ARTHROPLASTY Right 03/17/2022   Procedure: RIGHT TOTAL KNEE ARTHROPLASTY;  Surgeon: Renette Butters, MD;  Location[facility];  Service: Orthopedics;  Laterality: Right;   Patient Active Problem List   Diagnosis Date Noted   S/P total knee arthroplasty, right 03/17/2022   Malignant neoplasm of upper-outer quadrant of right breast in female, estrogen receptor positive ([place]) 09/09/2020   Steatohepatitis 07/25/2019   Acute exacerbation of moderate persistent extrinsic asthma 06/02/2016   Obesity due to excess calories with serious comorbidity 04/23/2016   Pre-operative clearance 09/23/2015   Mixed hyperlipidemia 07/29/2011   OA (osteoarthritis) of knee 07/29/2011   Morbid obesity with body mass index of 45.0-49.9 in adult [facility]) 03/17/2010   Insulin resistance syndrome 11/13/2009   HYPERLIPIDEMIA 11/13/2009   Anxiety state 11/13/2009   Essential hypertension 11/13/2009  Mild intermittent asthma 11/13/2009   GERD 11/13/2009   ENDOMETRIOSIS 11/13/2009   OSA (obstructive sleep apnea) 11/13/2009    REFERRING DIAG: right breast cancer at risk for lymphedema  THERAPY DIAG: Aftercare following surgery for neoplasm  PERTINENT HISTORY: Patient was diagnosed on 09/02/2020 with right grade II invasive ductal carcinoma breast cancer. She underwent a right lumpectomy and sentinel node biopsy (1 negative) on 10/03/2020. It is ER/PR positive and HER2 negative with a Ki67 of 30%.   PRECAUTIONS: right UE Lymphedema risk, None  SUBJECTIVE: Pt returns for her final 3 month L-Dex screen.   PAIN:  Are you having pain? No  SOZO SCREENING: Patient was assessed today using the  SOZO machine to determine the lymphedema index score. This was compared to her baseline score. It was determined that she is within the recommended range when compared to her baseline and no further action is needed at this time. She will continue SOZO screenings. These are done every 3 months for 2 years post operatively followed by every 6 months for 2 years, and then annually.   L-DEX FLOWSHEETS - 09/07/22 0900       L-DEX LYMPHEDEMA SCREENING   Measurement Type Unilateral    L-DEX MEASUREMENT EXTREMITY Upper Extremity    POSITION  Standing    DOMINANT SIDE Right    At Risk Side Right    BASELINE SCORE (UNILATERAL) -1.2    L-DEX SCORE (UNILATERAL) -0.7    VALUE CHANGE (UNILAT) 0.5              Otelia Limes, PTA 09/07/2022, 9:05 AM

## 2022-09-14 DIAGNOSIS — F419 Anxiety disorder, unspecified: Secondary | ICD-10-CM | POA: Diagnosis not present

## 2022-09-14 DIAGNOSIS — F3289 Other specified depressive episodes: Secondary | ICD-10-CM | POA: Diagnosis not present

## 2022-09-17 DIAGNOSIS — Z803 Family history of malignant neoplasm of breast: Secondary | ICD-10-CM | POA: Diagnosis not present

## 2022-09-17 DIAGNOSIS — Z809 Family history of malignant neoplasm, unspecified: Secondary | ICD-10-CM | POA: Diagnosis not present

## 2022-09-17 DIAGNOSIS — C50911 Malignant neoplasm of unspecified site of right female breast: Secondary | ICD-10-CM | POA: Diagnosis not present

## 2022-09-17 DIAGNOSIS — Z86010 Personal history of colonic polyps: Secondary | ICD-10-CM | POA: Diagnosis not present

## 2022-09-17 DIAGNOSIS — Z808 Family history of malignant neoplasm of other organs or systems: Secondary | ICD-10-CM | POA: Diagnosis not present

## 2022-09-21 DIAGNOSIS — F3289 Other specified depressive episodes: Secondary | ICD-10-CM | POA: Diagnosis not present

## 2022-09-21 DIAGNOSIS — F419 Anxiety disorder, unspecified: Secondary | ICD-10-CM | POA: Diagnosis not present

## 2022-09-28 DIAGNOSIS — F419 Anxiety disorder, unspecified: Secondary | ICD-10-CM | POA: Diagnosis not present

## 2022-09-28 DIAGNOSIS — F3289 Other specified depressive episodes: Secondary | ICD-10-CM | POA: Diagnosis not present

## 2022-10-05 DIAGNOSIS — F3289 Other specified depressive episodes: Secondary | ICD-10-CM | POA: Diagnosis not present

## 2022-10-05 DIAGNOSIS — F419 Anxiety disorder, unspecified: Secondary | ICD-10-CM | POA: Diagnosis not present

## 2022-10-06 DIAGNOSIS — M47816 Spondylosis without myelopathy or radiculopathy, lumbar region: Secondary | ICD-10-CM | POA: Diagnosis not present

## 2022-10-06 DIAGNOSIS — Z713 Dietary counseling and surveillance: Secondary | ICD-10-CM | POA: Diagnosis not present

## 2022-10-07 ENCOUNTER — Encounter: Payer: Self-pay | Admitting: Family Medicine

## 2022-10-07 NOTE — Telephone Encounter (Signed)
Printed to have scanned into chart.

## 2022-10-12 ENCOUNTER — Encounter: Payer: Self-pay | Admitting: *Deleted

## 2022-10-12 DIAGNOSIS — F419 Anxiety disorder, unspecified: Secondary | ICD-10-CM | POA: Diagnosis not present

## 2022-10-12 DIAGNOSIS — F3289 Other specified depressive episodes: Secondary | ICD-10-CM | POA: Diagnosis not present

## 2022-10-14 LAB — HEMOGLOBIN A1C: Hemoglobin A1C: 5.5

## 2022-10-19 DIAGNOSIS — F3289 Other specified depressive episodes: Secondary | ICD-10-CM | POA: Diagnosis not present

## 2022-10-19 DIAGNOSIS — F419 Anxiety disorder, unspecified: Secondary | ICD-10-CM | POA: Diagnosis not present

## 2022-10-23 ENCOUNTER — Other Ambulatory Visit: Payer: Self-pay | Admitting: *Deleted

## 2022-10-23 ENCOUNTER — Encounter: Payer: Self-pay | Admitting: Family Medicine

## 2022-10-23 ENCOUNTER — Encounter: Payer: Self-pay | Admitting: *Deleted

## 2022-10-23 DIAGNOSIS — I10 Essential (primary) hypertension: Secondary | ICD-10-CM

## 2022-10-23 MED ORDER — LOSARTAN POTASSIUM 50 MG PO TABS
ORAL_TABLET | ORAL | 0 refills | Status: DC
Start: 2022-10-23 — End: 2023-01-25

## 2022-11-08 ENCOUNTER — Encounter: Payer: Self-pay | Admitting: Family Medicine

## 2022-11-08 MED ORDER — GABAPENTIN 300 MG PO CAPS: ORAL_CAPSULE | ORAL | 0 refills | Status: DC

## 2022-11-09 DIAGNOSIS — F419 Anxiety disorder, unspecified: Secondary | ICD-10-CM | POA: Diagnosis not present

## 2022-11-09 DIAGNOSIS — F3289 Other specified depressive episodes: Secondary | ICD-10-CM | POA: Diagnosis not present

## 2022-11-13 DIAGNOSIS — M47816 Spondylosis without myelopathy or radiculopathy, lumbar region: Secondary | ICD-10-CM | POA: Diagnosis not present

## 2022-11-14 DIAGNOSIS — E1169 Type 2 diabetes mellitus with other specified complication: Secondary | ICD-10-CM | POA: Insufficient documentation

## 2022-11-14 DIAGNOSIS — I152 Hypertension secondary to endocrine disorders: Secondary | ICD-10-CM | POA: Insufficient documentation

## 2022-11-14 DIAGNOSIS — E119 Type 2 diabetes mellitus without complications: Secondary | ICD-10-CM | POA: Insufficient documentation

## 2022-11-14 NOTE — Progress Notes (Unsigned)
No chief complaint on file.  Patient presents for 6 month follow-up on chronic problems.  Hypertension.  She is currently taking 50mg  losartan, 12.5mg  HCTZ and amlodipine 7.5mg . She is tolerating this without side effects.   She denies headaches, dizziness, chest pain, shortness of breath, edema. BP's are not checked at home.  BP Readings from Last 3 Encounters:  06/17/22 136/66  05/27/22 110/70  05/04/22 110/68      Mixed hyperlipidemia--She continues on atorvastatin 80mg  daily and denies side effects.  She is taking OTC omega-3 fish oil 2000mg  BID (Vascepa and Lovaza weren't covered by her insurance).  She tries to follow a lowfat diet.  Lab Results  Component Value Date   CHOL 128 05/08/2022   HDL 41 05/08/2022   LDLCALC 61 05/08/2022   TRIG 151 (H) 05/08/2022   CHOLHDL 3.1 05/08/2022    Diabetes: She had prediabetes for many years, A1c went up to 6.5% in 10/2021, and up to 6.6% when checked shortly after by the bariatric clinic.   She is currently on ozempic and Metformin, denies side effects. Last A1c here was 5.5 in 02/2022 on this regimen. It was 5.5% on 10/14/22 at [facility]   Obesity:  She continues to go to [facility] b[facility] clinic.   She eats 5 meals/day. 64 oz of water daily.  If hungry, can have a cup of broth or more water. She states she has lost almost 52# since June. She is on Ozempic from weight loss clinic.  Mounjaro wasn't approved (took it only for 1 month with coupon).   Wt Readings from Last 3 Encounters:  09/07/22 208 lb 4 oz (94.5 kg)  06/17/22 209 lb 14.4 oz (95.2 kg)  06/08/22 210 lb 2 oz (95.3 kg)  Weight 250# 3.2 oz in 04/2021.   She is under the care of pulmonary for asthma and OSA. She is doing well on Arnuity, not needing rescue inhaler.  She sees Dr. Maple Hudson for sleep apnea, doing well on CPAP.   Breast cancer--Doing well on anastrozole. Tolerating side effects. Last saw oncology in 05/2022. Last mammogram was in October. She denies breast concerns.    Chronic back pain: She had bilateral RF ablation in July 2023, with good results.  Her back flared up with the CPM machine after her knee surgery.  She got toradol injection, a steroid shot and a prednisone pack, and she has been fine since. Using methocarbamol since knee surgery. Previously took Tizanidine for her back (not sure that she will need it, will see how she does after stopping the methocarbamol). UPDATE BACK AND KNEE   Depression: She continues to see psychiatrist and counselor. She remains on Pristiq and doing well.  She is practicing gratitude and mindfulness. Energy has improved.    PMH, PSH, SH reviewed    ROS: no fever, chills, URI symptoms.  Breathing is at baseline. No chest pain, palpitations, edema No GI or urinary issues. (No further epigastric pain) No bleeding, bruising, rash +intentional weight loss Knee pain improved,  s/p TKR Back pain  Moods   PHYSICAL EXAM:  LMP 01/27/2009 (Exact Date)   Wt Readings from Last 3 Encounters:  09/07/22 208 lb 4 oz (94.5 kg)  06/17/22 209 lb 14.4 oz (95.2 kg)  06/08/22 210 lb 2 oz (95.3 kg)   Pleasant female in no distress HEENT: conjunctiva and sclera are clear, EOMI. OP clear Neck: no lymphadenopathy, thyromegaly or mass, no bruit Heart: regular rate and rhythm, no murmur Lungs: clear, no wheezing Back:  no CVA tenderness. No spinal tenderness, muscle spasm Abdomen: soft, obese, nontender Extremities: no edema, normal pulses. Skin: normal turgor, no visible rash Neuro: alert and oriented, normal gait Psych:  Normal eye contact, speech, hygiene and grooming  DIABETIC FOOT EXAM    ASSESSMENT/PLAN:  A1c 5.5% done on 10/14/22 at [facility] (in Care Everywhere)--needs to be abstracted (showing as care gap) How often does she take diclofenac and does she need it refilled??  Needs DIABETIC FOOT EXAM

## 2022-11-16 ENCOUNTER — Ambulatory Visit (INDEPENDENT_AMBULATORY_CARE_PROVIDER_SITE_OTHER): Payer: BC Managed Care – PPO | Admitting: Family Medicine

## 2022-11-16 ENCOUNTER — Encounter: Payer: Self-pay | Admitting: Family Medicine

## 2022-11-16 VITALS — BP 100/60 | HR 72 | Ht 64.0 in | Wt 204.2 lb

## 2022-11-16 DIAGNOSIS — I152 Hypertension secondary to endocrine disorders: Secondary | ICD-10-CM

## 2022-11-16 DIAGNOSIS — E1159 Type 2 diabetes mellitus with other circulatory complications: Secondary | ICD-10-CM

## 2022-11-16 DIAGNOSIS — C50411 Malignant neoplasm of upper-outer quadrant of right female breast: Secondary | ICD-10-CM

## 2022-11-16 DIAGNOSIS — I10 Essential (primary) hypertension: Secondary | ICD-10-CM

## 2022-11-16 DIAGNOSIS — Z5181 Encounter for therapeutic drug level monitoring: Secondary | ICD-10-CM | POA: Diagnosis not present

## 2022-11-16 DIAGNOSIS — F339 Major depressive disorder, recurrent, unspecified: Secondary | ICD-10-CM | POA: Diagnosis not present

## 2022-11-16 DIAGNOSIS — G8929 Other chronic pain: Secondary | ICD-10-CM

## 2022-11-16 DIAGNOSIS — E1169 Type 2 diabetes mellitus with other specified complication: Secondary | ICD-10-CM | POA: Diagnosis not present

## 2022-11-16 DIAGNOSIS — F419 Anxiety disorder, unspecified: Secondary | ICD-10-CM | POA: Diagnosis not present

## 2022-11-16 DIAGNOSIS — M545 Low back pain, unspecified: Secondary | ICD-10-CM

## 2022-11-16 DIAGNOSIS — E118 Type 2 diabetes mellitus with unspecified complications: Secondary | ICD-10-CM

## 2022-11-16 DIAGNOSIS — J4520 Mild intermittent asthma, uncomplicated: Secondary | ICD-10-CM

## 2022-11-16 DIAGNOSIS — F3289 Other specified depressive episodes: Secondary | ICD-10-CM | POA: Diagnosis not present

## 2022-11-16 DIAGNOSIS — Z170 Estrogen receptor positive status [ER+]: Secondary | ICD-10-CM

## 2022-11-16 DIAGNOSIS — E785 Hyperlipidemia, unspecified: Secondary | ICD-10-CM

## 2022-11-16 MED ORDER — HYDROCHLOROTHIAZIDE 12.5 MG PO CAPS: ORAL_CAPSULE | ORAL | 1 refills | Status: DC

## 2022-11-16 MED ORDER — DICLOFENAC SODIUM 75 MG PO TBEC
DELAYED_RELEASE_TABLET | ORAL | 0 refills | Status: DC
Start: 2022-11-16 — End: 2023-01-25

## 2022-11-16 MED ORDER — AMLODIPINE BESYLATE 5 MG PO TABS: ORAL_TABLET | ORAL | 0 refills | Status: DC

## 2022-11-16 NOTE — Patient Instructions (Signed)
  Continue to monitor blood pressure at home. Okay to decrease the amlodipine to 1 tablet (5mg ) if your home BP's are staying <105 systolic. Monitor your blood pressure and send [place] the list in a 2-3 weeks after a dose change (or prior if you aren't sure if you should adjust the dose).  We are checking a chem panel today. If your liver enzymes are okay, I'd rather you take the tylenol arthritis twice daily, and use the diclofenac less often, just when needed. This might help your stomach (and kidneys).

## 2022-11-17 ENCOUNTER — Encounter: Payer: Self-pay | Admitting: *Deleted

## 2022-11-17 LAB — COMPREHENSIVE METABOLIC PANEL
ALT: 42 IU/L — ABNORMAL HIGH (ref 0–32)
AST: 38 IU/L (ref 0–40)
Albumin/Globulin Ratio: 1.6 (ref 1.2–2.2)
Albumin: 4.6 g/dL (ref 3.8–4.9)
Alkaline Phosphatase: 95 IU/L (ref 44–121)
BUN/Creatinine Ratio: 20 (ref 9–23)
BUN: 17 mg/dL (ref 6–24)
Bilirubin Total: 0.5 mg/dL (ref 0.0–1.2)
CO2: 27 mmol/L (ref 20–29)
Calcium: 10.4 mg/dL — ABNORMAL HIGH (ref 8.7–10.2)
Chloride: 99 mmol/L (ref 96–106)
Creatinine, Ser: 0.87 mg/dL (ref 0.57–1.00)
Globulin, Total: 2.8 g/dL (ref 1.5–4.5)
Glucose: 87 mg/dL (ref 70–99)
Potassium: 4.2 mmol/L (ref 3.5–5.2)
Sodium: 139 mmol/L (ref 134–144)
Total Protein: 7.4 g/dL (ref 6.0–8.5)
eGFR: 77 mL/min/{1.73_m2} (ref >59)

## 2022-11-17 LAB — MICROALBUMIN / CREATININE URINE RATIO
Creatinine, Urine: 169.7 mg/dL
Microalb/Creat Ratio: 11 mg/g creat (ref 0–29)
Microalbumin, Urine: 18 ug/mL

## 2022-11-25 DIAGNOSIS — F332 Major depressive disorder, recurrent severe without psychotic features: Secondary | ICD-10-CM | POA: Diagnosis not present

## 2022-11-26 DIAGNOSIS — E669 Obesity, unspecified: Secondary | ICD-10-CM | POA: Diagnosis not present

## 2022-11-26 DIAGNOSIS — Z7985 Long-term (current) use of injectable non-insulin antidiabetic drugs: Secondary | ICD-10-CM | POA: Diagnosis not present

## 2022-11-26 DIAGNOSIS — E119 Type 2 diabetes mellitus without complications: Secondary | ICD-10-CM | POA: Diagnosis not present

## 2022-11-26 DIAGNOSIS — Z7984 Long term (current) use of oral hypoglycemic drugs: Secondary | ICD-10-CM | POA: Diagnosis not present

## 2022-11-27 DIAGNOSIS — F332 Major depressive disorder, recurrent severe without psychotic features: Secondary | ICD-10-CM | POA: Diagnosis not present

## 2022-12-01 DIAGNOSIS — F332 Major depressive disorder, recurrent severe without psychotic features: Secondary | ICD-10-CM | POA: Diagnosis not present

## 2022-12-02 DIAGNOSIS — F332 Major depressive disorder, recurrent severe without psychotic features: Secondary | ICD-10-CM | POA: Diagnosis not present

## 2022-12-04 DIAGNOSIS — F332 Major depressive disorder, recurrent severe without psychotic features: Secondary | ICD-10-CM | POA: Diagnosis not present

## 2022-12-07 DIAGNOSIS — F332 Major depressive disorder, recurrent severe without psychotic features: Secondary | ICD-10-CM | POA: Diagnosis not present

## 2022-12-08 DIAGNOSIS — F332 Major depressive disorder, recurrent severe without psychotic features: Secondary | ICD-10-CM | POA: Diagnosis not present

## 2022-12-09 ENCOUNTER — Encounter: Payer: Self-pay | Admitting: Family Medicine

## 2022-12-09 DIAGNOSIS — E6609 Other obesity due to excess calories: Secondary | ICD-10-CM | POA: Diagnosis not present

## 2022-12-09 DIAGNOSIS — I10 Essential (primary) hypertension: Secondary | ICD-10-CM | POA: Diagnosis not present

## 2022-12-09 DIAGNOSIS — F341 Dysthymic disorder: Secondary | ICD-10-CM | POA: Diagnosis not present

## 2022-12-09 DIAGNOSIS — E1169 Type 2 diabetes mellitus with other specified complication: Secondary | ICD-10-CM | POA: Diagnosis not present

## 2022-12-10 DIAGNOSIS — F332 Major depressive disorder, recurrent severe without psychotic features: Secondary | ICD-10-CM | POA: Diagnosis not present

## 2022-12-15 DIAGNOSIS — F332 Major depressive disorder, recurrent severe without psychotic features: Secondary | ICD-10-CM | POA: Diagnosis not present

## 2022-12-16 DIAGNOSIS — F332 Major depressive disorder, recurrent severe without psychotic features: Secondary | ICD-10-CM | POA: Diagnosis not present

## 2022-12-17 DIAGNOSIS — E559 Vitamin D deficiency, unspecified: Secondary | ICD-10-CM | POA: Diagnosis not present

## 2022-12-17 DIAGNOSIS — E6609 Other obesity due to excess calories: Secondary | ICD-10-CM | POA: Diagnosis not present

## 2022-12-17 DIAGNOSIS — Z6834 Body mass index (BMI) 34.0-34.9, adult: Secondary | ICD-10-CM | POA: Diagnosis not present

## 2022-12-17 DIAGNOSIS — E1169 Type 2 diabetes mellitus with other specified complication: Secondary | ICD-10-CM | POA: Diagnosis not present

## 2022-12-17 DIAGNOSIS — M47816 Spondylosis without myelopathy or radiculopathy, lumbar region: Secondary | ICD-10-CM | POA: Diagnosis not present

## 2022-12-18 DIAGNOSIS — F332 Major depressive disorder, recurrent severe without psychotic features: Secondary | ICD-10-CM | POA: Diagnosis not present

## 2022-12-20 ENCOUNTER — Other Ambulatory Visit: Payer: Self-pay | Admitting: Hematology and Oncology

## 2022-12-22 DIAGNOSIS — F332 Major depressive disorder, recurrent severe without psychotic features: Secondary | ICD-10-CM | POA: Diagnosis not present

## 2022-12-23 DIAGNOSIS — Z7985 Long-term (current) use of injectable non-insulin antidiabetic drugs: Secondary | ICD-10-CM | POA: Diagnosis not present

## 2022-12-23 DIAGNOSIS — E1169 Type 2 diabetes mellitus with other specified complication: Secondary | ICD-10-CM | POA: Diagnosis not present

## 2022-12-23 DIAGNOSIS — E669 Obesity, unspecified: Secondary | ICD-10-CM | POA: Diagnosis not present

## 2022-12-23 DIAGNOSIS — Z7984 Long term (current) use of oral hypoglycemic drugs: Secondary | ICD-10-CM | POA: Diagnosis not present

## 2022-12-25 DIAGNOSIS — F332 Major depressive disorder, recurrent severe without psychotic features: Secondary | ICD-10-CM | POA: Diagnosis not present

## 2022-12-28 DIAGNOSIS — Z713 Dietary counseling and surveillance: Secondary | ICD-10-CM | POA: Diagnosis not present

## 2022-12-29 DIAGNOSIS — F332 Major depressive disorder, recurrent severe without psychotic features: Secondary | ICD-10-CM | POA: Diagnosis not present

## 2023-01-05 DIAGNOSIS — F332 Major depressive disorder, recurrent severe without psychotic features: Secondary | ICD-10-CM | POA: Diagnosis not present

## 2023-01-08 DIAGNOSIS — F332 Major depressive disorder, recurrent severe without psychotic features: Secondary | ICD-10-CM | POA: Diagnosis not present

## 2023-01-12 DIAGNOSIS — F332 Major depressive disorder, recurrent severe without psychotic features: Secondary | ICD-10-CM | POA: Diagnosis not present

## 2023-01-15 DIAGNOSIS — F332 Major depressive disorder, recurrent severe without psychotic features: Secondary | ICD-10-CM | POA: Diagnosis not present

## 2023-01-19 DIAGNOSIS — G4733 Obstructive sleep apnea (adult) (pediatric): Secondary | ICD-10-CM | POA: Diagnosis not present

## 2023-01-22 DIAGNOSIS — F332 Major depressive disorder, recurrent severe without psychotic features: Secondary | ICD-10-CM | POA: Diagnosis not present

## 2023-01-24 ENCOUNTER — Other Ambulatory Visit: Payer: Self-pay | Admitting: Family Medicine

## 2023-01-24 DIAGNOSIS — I10 Essential (primary) hypertension: Secondary | ICD-10-CM

## 2023-01-24 DIAGNOSIS — M545 Low back pain, unspecified: Secondary | ICD-10-CM

## 2023-01-25 NOTE — Telephone Encounter (Signed)
Is this okay to refill? 

## 2023-01-26 DIAGNOSIS — F332 Major depressive disorder, recurrent severe without psychotic features: Secondary | ICD-10-CM | POA: Diagnosis not present

## 2023-01-29 DIAGNOSIS — F332 Major depressive disorder, recurrent severe without psychotic features: Secondary | ICD-10-CM | POA: Diagnosis not present

## 2023-01-31 ENCOUNTER — Other Ambulatory Visit: Payer: Self-pay | Admitting: Family Medicine

## 2023-02-01 NOTE — Telephone Encounter (Signed)
Is this okay to refill>? 

## 2023-02-02 DIAGNOSIS — F332 Major depressive disorder, recurrent severe without psychotic features: Secondary | ICD-10-CM | POA: Diagnosis not present

## 2023-02-05 DIAGNOSIS — F332 Major depressive disorder, recurrent severe without psychotic features: Secondary | ICD-10-CM | POA: Diagnosis not present

## 2023-02-05 DIAGNOSIS — R635 Abnormal weight gain: Secondary | ICD-10-CM | POA: Diagnosis not present

## 2023-02-09 DIAGNOSIS — D1723 Benign lipomatous neoplasm of skin and subcutaneous tissue of right leg: Secondary | ICD-10-CM | POA: Diagnosis not present

## 2023-02-09 DIAGNOSIS — F332 Major depressive disorder, recurrent severe without psychotic features: Secondary | ICD-10-CM | POA: Diagnosis not present

## 2023-02-09 DIAGNOSIS — L649 Androgenic alopecia, unspecified: Secondary | ICD-10-CM | POA: Diagnosis not present

## 2023-02-09 DIAGNOSIS — D1721 Benign lipomatous neoplasm of skin and subcutaneous tissue of right arm: Secondary | ICD-10-CM | POA: Diagnosis not present

## 2023-02-09 DIAGNOSIS — L814 Other melanin hyperpigmentation: Secondary | ICD-10-CM | POA: Diagnosis not present

## 2023-02-11 DIAGNOSIS — E109 Type 1 diabetes mellitus without complications: Secondary | ICD-10-CM | POA: Diagnosis not present

## 2023-02-11 DIAGNOSIS — F32A Depression, unspecified: Secondary | ICD-10-CM | POA: Diagnosis not present

## 2023-02-11 DIAGNOSIS — E669 Obesity, unspecified: Secondary | ICD-10-CM | POA: Diagnosis not present

## 2023-02-11 DIAGNOSIS — K648 Other hemorrhoids: Secondary | ICD-10-CM | POA: Diagnosis not present

## 2023-02-11 DIAGNOSIS — K317 Polyp of stomach and duodenum: Secondary | ICD-10-CM | POA: Diagnosis not present

## 2023-02-11 DIAGNOSIS — G4733 Obstructive sleep apnea (adult) (pediatric): Secondary | ICD-10-CM | POA: Diagnosis not present

## 2023-02-11 DIAGNOSIS — R1011 Right upper quadrant pain: Secondary | ICD-10-CM | POA: Diagnosis not present

## 2023-02-11 DIAGNOSIS — K2930 Chronic superficial gastritis without bleeding: Secondary | ICD-10-CM | POA: Diagnosis not present

## 2023-02-11 DIAGNOSIS — Z1211 Encounter for screening for malignant neoplasm of colon: Secondary | ICD-10-CM | POA: Diagnosis not present

## 2023-02-11 DIAGNOSIS — R1013 Epigastric pain: Secondary | ICD-10-CM | POA: Diagnosis not present

## 2023-02-11 DIAGNOSIS — Z83719 Family history of colon polyps, unspecified: Secondary | ICD-10-CM | POA: Diagnosis not present

## 2023-02-11 LAB — HM COLONOSCOPY

## 2023-02-12 DIAGNOSIS — F332 Major depressive disorder, recurrent severe without psychotic features: Secondary | ICD-10-CM | POA: Diagnosis not present

## 2023-02-16 DIAGNOSIS — F332 Major depressive disorder, recurrent severe without psychotic features: Secondary | ICD-10-CM | POA: Diagnosis not present

## 2023-02-19 DIAGNOSIS — F332 Major depressive disorder, recurrent severe without psychotic features: Secondary | ICD-10-CM | POA: Diagnosis not present

## 2023-02-23 DIAGNOSIS — F332 Major depressive disorder, recurrent severe without psychotic features: Secondary | ICD-10-CM | POA: Diagnosis not present

## 2023-02-26 DIAGNOSIS — F332 Major depressive disorder, recurrent severe without psychotic features: Secondary | ICD-10-CM | POA: Diagnosis not present

## 2023-03-02 DIAGNOSIS — F3341 Major depressive disorder, recurrent, in partial remission: Secondary | ICD-10-CM | POA: Diagnosis not present

## 2023-03-02 DIAGNOSIS — F411 Generalized anxiety disorder: Secondary | ICD-10-CM | POA: Diagnosis not present

## 2023-03-03 DIAGNOSIS — F332 Major depressive disorder, recurrent severe without psychotic features: Secondary | ICD-10-CM | POA: Diagnosis not present

## 2023-03-07 ENCOUNTER — Other Ambulatory Visit: Payer: Self-pay | Admitting: Family Medicine

## 2023-03-07 DIAGNOSIS — I10 Essential (primary) hypertension: Secondary | ICD-10-CM

## 2023-03-08 ENCOUNTER — Other Ambulatory Visit: Payer: Self-pay | Admitting: Gastroenterology

## 2023-03-08 ENCOUNTER — Ambulatory Visit: Payer: BC Managed Care – PPO | Attending: Surgery

## 2023-03-08 VITALS — Wt 204.5 lb

## 2023-03-08 DIAGNOSIS — Z483 Aftercare following surgery for neoplasm: Secondary | ICD-10-CM | POA: Insufficient documentation

## 2023-03-08 DIAGNOSIS — R109 Unspecified abdominal pain: Secondary | ICD-10-CM

## 2023-03-08 NOTE — Therapy (Signed)
OUTPATIENT PHYSICAL THERAPY SOZO SCREENING NOTE   Patient Name: Susan Gould MRN: [id number] DOB:20-Apr-1966, 57 y.o., female Today's Date: 03/08/2023  PCP: Joselyn Arrow, MD REFERRING PROVIDER: Abigail Miyamoto, MD   PT End of Session - 03/08/23 [phone]     Visit Number 2   # unchanged due to screen only   PT Start Time 0901    PT Stop Time 0905    PT Time Calculation (min) 4 min    Activity Tolerance Patient tolerated treatment well    Behavior During Therapy [facility] for tasks assessed/performed             Past Medical History:  Diagnosis Date   Allergic rhinitis, cause unspecified    cat/dog/ragweed/mold/wool, s/p immunotherapy x 3-4 yrs   Anemia    Anxiety 2006   Asthma    (Dr. Delford Field)   Breast cancer [facility]) 06/2020   Colon polyps 2007   Dr. Randa Evens (she was told repeat in 10 years)   COVID-19 virus infection 12/2020   DDD (degenerative disc disease), lumbosacral    Depression 2006   Endometriosis    Dr. Pennie Rushing   Family history of breast cancer in female    Fatty liver    GERD (gastroesophageal reflux disease)    Heart murmur    Hemorrhoid    internal and external   History of radiation therapy 12/06/2020   right breast 11/07/2020-12/06/2020  Dr Antony Blackbird   Hyperlipidemia    Hypertension    Insulin resistance    Dr. Pennie Rushing   Medial meniscus tear    Left. Surgery Richardson Landry.  NOT seen on MRI 06/2013   Obesity    OSA (obstructive sleep apnea)    on CPAP (Dr. Shelle Iron)   Past Surgical History:  Procedure Laterality Date   BREAST LUMPECTOMY WITH SENTINEL LYMPH NODE BIOPSY Right 10/03/2020   Procedure: RIGHT BREAST LUMPECTOMY WITH SENTINEL LYMPH NODE BIOPSY;  Surgeon: Abigail Miyamoto, MD;  Location: [facility];  Service: General;  Laterality: Right;   CARPAL TUNNEL RELEASE Right 12/28/2019   Dr. Eulah Pont   CARPAL TUNNEL RELEASE Left 02/22/2020   CHOLECYSTECTOMY  1986   EYE SURGERY  2000   FINGER GANGLION CYST EXCISION  2003   RIGHT 3rd  finger   JOINT REPLACEMENT  2017   KNEE ARTHROSCOPY Right 04/17/2021   Dr. Renaye Rakers (torn meniscus)   KNEE SURGERY  '95 and '97   BILATERAL KNEE   LAPAROSCOPY  '98, '01   endometriosis (Dr. Pennie Rushing)   LASIK  2000   RADIOFREQUENCY ABLATION Bilateral 12/2021   for back pain   tibial tuberoplasty  90's   left   TOTAL KNEE ARTHROPLASTY Right 03/17/2022   Procedure: RIGHT TOTAL KNEE ARTHROPLASTY;  Surgeon: Sheral Apley, MD;  Location: [facility];  Service: Orthopedics;  Laterality: Right;   Patient Active Problem List   Diagnosis Date Noted   Diabetes mellitus type II, controlled (HCC) 11/14/2022   Hypertension associated with diabetes (HCC) 11/14/2022   Hyperlipidemia associated with type 2 diabetes mellitus (HCC) 11/14/2022   S/P total knee arthroplasty, right 03/17/2022   Malignant neoplasm of upper-outer quadrant of right breast in female, estrogen receptor positive (HCC) 09/09/2020   Steatohepatitis 07/25/2019   Acute exacerbation of moderate persistent extrinsic asthma 06/02/2016   Obesity due to excess calories with serious comorbidity 04/23/2016   Pre-operative clearance 09/23/2015   Mixed hyperlipidemia 07/29/2011   OA (osteoarthritis) of knee 07/29/2011   Morbid obesity with body mass index  of 45.0-49.9 in adult [facility]) 03/17/2010   Insulin resistance syndrome 11/13/2009   HYPERLIPIDEMIA 11/13/2009   Anxiety state 11/13/2009   Essential hypertension 11/13/2009   Mild intermittent asthma 11/13/2009   GERD 11/13/2009   ENDOMETRIOSIS 11/13/2009   OSA (obstructive sleep apnea) 11/13/2009    REFERRING DIAG: right breast cancer at risk for lymphedema  THERAPY DIAG: Aftercare following surgery for neoplasm  PERTINENT HISTORY: Patient was diagnosed on 09/02/2020 with right grade II invasive ductal carcinoma breast cancer. She underwent a right lumpectomy and sentinel node biopsy (1 negative) on 10/03/2020. It is ER/PR positive and HER2 negative with a Ki67 of 30%.    PRECAUTIONS: right UE Lymphedema risk, None  SUBJECTIVE: Pt returns for her 6 month L-Dex screen.   PAIN:  Are you having pain? No  SOZO SCREENING: Patient was assessed today using the SOZO machine to determine the lymphedema index score. This was compared to her baseline score. It was determined that she is within the recommended range when compared to her baseline and no further action is needed at this time. She will continue SOZO screenings. These are done every 3 months for 2 years post operatively followed by every 6 months for 2 years, and then annually.   L-DEX FLOWSHEETS - 03/08/23 0900       L-DEX LYMPHEDEMA SCREENING   Measurement Type Unilateral    L-DEX MEASUREMENT EXTREMITY Upper Extremity    POSITION  Standing    DOMINANT SIDE Right    At Risk Side Right    BASELINE SCORE (UNILATERAL) -1.2    L-DEX SCORE (UNILATERAL) 0.1    VALUE CHANGE (UNILAT) 1.3              Hermenia Bers, PTA 03/08/2023, 9:05 AM

## 2023-03-09 ENCOUNTER — Encounter: Payer: Self-pay | Admitting: Gastroenterology

## 2023-03-09 DIAGNOSIS — F332 Major depressive disorder, recurrent severe without psychotic features: Secondary | ICD-10-CM | POA: Diagnosis not present

## 2023-03-10 DIAGNOSIS — F332 Major depressive disorder, recurrent severe without psychotic features: Secondary | ICD-10-CM | POA: Diagnosis not present

## 2023-03-11 ENCOUNTER — Encounter: Payer: Self-pay | Admitting: *Deleted

## 2023-03-12 DIAGNOSIS — E669 Obesity, unspecified: Secondary | ICD-10-CM | POA: Diagnosis not present

## 2023-03-12 DIAGNOSIS — Z713 Dietary counseling and surveillance: Secondary | ICD-10-CM | POA: Diagnosis not present

## 2023-03-15 DIAGNOSIS — F332 Major depressive disorder, recurrent severe without psychotic features: Secondary | ICD-10-CM | POA: Diagnosis not present

## 2023-03-16 DIAGNOSIS — F332 Major depressive disorder, recurrent severe without psychotic features: Secondary | ICD-10-CM | POA: Diagnosis not present

## 2023-03-17 DIAGNOSIS — M1711 Unilateral primary osteoarthritis, right knee: Secondary | ICD-10-CM | POA: Diagnosis not present

## 2023-03-17 DIAGNOSIS — M7582 Other shoulder lesions, left shoulder: Secondary | ICD-10-CM | POA: Diagnosis not present

## 2023-03-18 DIAGNOSIS — F332 Major depressive disorder, recurrent severe without psychotic features: Secondary | ICD-10-CM | POA: Diagnosis not present

## 2023-03-19 ENCOUNTER — Ambulatory Visit
Admission: RE | Admit: 2023-03-19 | Discharge: 2023-03-19 | Disposition: A | Discharge status: Home / Self Care | Payer: BC Managed Care – PPO | Source: Ambulatory Visit | Attending: Gastroenterology | Admitting: Gastroenterology

## 2023-03-19 DIAGNOSIS — K5900 Constipation, unspecified: Secondary | ICD-10-CM | POA: Diagnosis not present

## 2023-03-19 DIAGNOSIS — R1011 Right upper quadrant pain: Secondary | ICD-10-CM | POA: Diagnosis not present

## 2023-03-19 DIAGNOSIS — D259 Leiomyoma of uterus, unspecified: Secondary | ICD-10-CM | POA: Diagnosis not present

## 2023-03-19 DIAGNOSIS — R109 Unspecified abdominal pain: Secondary | ICD-10-CM

## 2023-03-19 MED ORDER — IOPAMIDOL (ISOVUE-300) INJECTION 61%
500.0000 mL | Freq: Once | INTRAVENOUS | Status: AC | PRN
  Administered 2023-03-19: 100 mL via INTRAVENOUS

## 2023-03-21 ENCOUNTER — Other Ambulatory Visit: Payer: Self-pay | Admitting: Family Medicine

## 2023-03-21 DIAGNOSIS — G8929 Other chronic pain: Secondary | ICD-10-CM

## 2023-03-22 NOTE — Telephone Encounter (Signed)
Is this okay to refill?

## 2023-03-23 DIAGNOSIS — F332 Major depressive disorder, recurrent severe without psychotic features: Secondary | ICD-10-CM | POA: Diagnosis not present

## 2023-03-24 DIAGNOSIS — F332 Major depressive disorder, recurrent severe without psychotic features: Secondary | ICD-10-CM | POA: Diagnosis not present

## 2023-03-26 DIAGNOSIS — F332 Major depressive disorder, recurrent severe without psychotic features: Secondary | ICD-10-CM | POA: Diagnosis not present

## 2023-03-29 DIAGNOSIS — F332 Major depressive disorder, recurrent severe without psychotic features: Secondary | ICD-10-CM | POA: Diagnosis not present

## 2023-03-30 DIAGNOSIS — F332 Major depressive disorder, recurrent severe without psychotic features: Secondary | ICD-10-CM | POA: Diagnosis not present

## 2023-03-31 DIAGNOSIS — F332 Major depressive disorder, recurrent severe without psychotic features: Secondary | ICD-10-CM | POA: Diagnosis not present

## 2023-04-05 DIAGNOSIS — E66811 Obesity, class 1: Secondary | ICD-10-CM | POA: Diagnosis not present

## 2023-04-05 DIAGNOSIS — Z713 Dietary counseling and surveillance: Secondary | ICD-10-CM | POA: Diagnosis not present

## 2023-04-07 DIAGNOSIS — F332 Major depressive disorder, recurrent severe without psychotic features: Secondary | ICD-10-CM | POA: Diagnosis not present

## 2023-04-09 DIAGNOSIS — F332 Major depressive disorder, recurrent severe without psychotic features: Secondary | ICD-10-CM | POA: Diagnosis not present

## 2023-04-12 DIAGNOSIS — F411 Generalized anxiety disorder: Secondary | ICD-10-CM | POA: Diagnosis not present

## 2023-04-12 DIAGNOSIS — F3342 Major depressive disorder, recurrent, in full remission: Secondary | ICD-10-CM | POA: Diagnosis not present

## 2023-04-13 DIAGNOSIS — F332 Major depressive disorder, recurrent severe without psychotic features: Secondary | ICD-10-CM | POA: Diagnosis not present

## 2023-04-14 DIAGNOSIS — F332 Major depressive disorder, recurrent severe without psychotic features: Secondary | ICD-10-CM | POA: Diagnosis not present

## 2023-04-16 DIAGNOSIS — F332 Major depressive disorder, recurrent severe without psychotic features: Secondary | ICD-10-CM | POA: Diagnosis not present

## 2023-04-19 ENCOUNTER — Encounter: Payer: Self-pay | Admitting: Hematology and Oncology

## 2023-04-19 ENCOUNTER — Other Ambulatory Visit: Payer: Self-pay | Admitting: Hematology and Oncology

## 2023-04-19 ENCOUNTER — Ambulatory Visit (HOSPITAL_COMMUNITY): Payer: BC Managed Care – PPO

## 2023-04-19 ENCOUNTER — Ambulatory Visit
Admission: RE | Admit: 2023-04-19 | Discharge: 2023-04-19 | Disposition: A | Discharge status: Home / Self Care | Payer: BC Managed Care – PPO | Source: Ambulatory Visit | Attending: Hematology and Oncology

## 2023-04-19 DIAGNOSIS — Z170 Estrogen receptor positive status [ER+]: Secondary | ICD-10-CM

## 2023-04-19 DIAGNOSIS — N6312 Unspecified lump in the right breast, upper inner quadrant: Secondary | ICD-10-CM | POA: Diagnosis not present

## 2023-04-19 DIAGNOSIS — N6310 Unspecified lump in the right breast, unspecified quadrant: Secondary | ICD-10-CM

## 2023-04-20 ENCOUNTER — Inpatient Hospital Stay
Admission: RE | Admit: 2023-04-20 | Discharge: 2023-04-20 | Discharge status: Home / Self Care | Payer: BC Managed Care – PPO | Source: Ambulatory Visit | Attending: Hematology and Oncology | Admitting: Hematology and Oncology

## 2023-04-20 ENCOUNTER — Ambulatory Visit
Admission: RE | Admit: 2023-04-20 | Discharge: 2023-04-20 | Disposition: A | Discharge status: Home / Self Care | Payer: BC Managed Care – PPO | Source: Ambulatory Visit | Attending: Hematology and Oncology

## 2023-04-20 DIAGNOSIS — C50411 Malignant neoplasm of upper-outer quadrant of right female breast: Secondary | ICD-10-CM

## 2023-04-20 DIAGNOSIS — N6310 Unspecified lump in the right breast, unspecified quadrant: Secondary | ICD-10-CM

## 2023-04-20 DIAGNOSIS — N6312 Unspecified lump in the right breast, upper inner quadrant: Secondary | ICD-10-CM | POA: Diagnosis not present

## 2023-04-20 DIAGNOSIS — F332 Major depressive disorder, recurrent severe without psychotic features: Secondary | ICD-10-CM | POA: Diagnosis not present

## 2023-04-20 DIAGNOSIS — N6031 Fibrosclerosis of right breast: Secondary | ICD-10-CM | POA: Diagnosis not present

## 2023-04-20 HISTORY — PX: BREAST BIOPSY: SHX20

## 2023-04-21 DIAGNOSIS — Z6833 Body mass index (BMI) 33.0-33.9, adult: Secondary | ICD-10-CM | POA: Diagnosis not present

## 2023-04-21 DIAGNOSIS — E119 Type 2 diabetes mellitus without complications: Secondary | ICD-10-CM | POA: Diagnosis not present

## 2023-04-21 DIAGNOSIS — E109 Type 1 diabetes mellitus without complications: Secondary | ICD-10-CM | POA: Diagnosis not present

## 2023-04-21 DIAGNOSIS — F411 Generalized anxiety disorder: Secondary | ICD-10-CM | POA: Diagnosis not present

## 2023-04-21 DIAGNOSIS — G4733 Obstructive sleep apnea (adult) (pediatric): Secondary | ICD-10-CM | POA: Diagnosis not present

## 2023-04-21 LAB — HM DIABETES EYE EXAM

## 2023-04-21 LAB — SURGICAL PATHOLOGY

## 2023-04-22 ENCOUNTER — Other Ambulatory Visit: Payer: Self-pay | Admitting: Family Medicine

## 2023-04-22 DIAGNOSIS — F332 Major depressive disorder, recurrent severe without psychotic features: Secondary | ICD-10-CM | POA: Diagnosis not present

## 2023-04-22 DIAGNOSIS — I10 Essential (primary) hypertension: Secondary | ICD-10-CM

## 2023-04-26 ENCOUNTER — Other Ambulatory Visit: Payer: Self-pay | Admitting: Hematology and Oncology

## 2023-04-26 DIAGNOSIS — Z170 Estrogen receptor positive status [ER+]: Secondary | ICD-10-CM

## 2023-04-26 NOTE — Progress Notes (Signed)
Follow-up mammogram and ultrasound ordered in 6 months per recommendations from radiology.

## 2023-04-27 DIAGNOSIS — F332 Major depressive disorder, recurrent severe without psychotic features: Secondary | ICD-10-CM | POA: Diagnosis not present

## 2023-04-28 DIAGNOSIS — F332 Major depressive disorder, recurrent severe without psychotic features: Secondary | ICD-10-CM | POA: Diagnosis not present

## 2023-05-02 ENCOUNTER — Other Ambulatory Visit: Payer: Self-pay | Admitting: Family Medicine

## 2023-05-02 NOTE — Progress Notes (Unsigned)
HPI female never smoker followed for OSA, complicated by obesity, GERD, HBP Followed by Dr. Marchelle Gearing for asthma NPSG 04/23/06- AHI 42/ hr, desat to 89% ---------------------------------------------------------------------------  04/30/22- 57 year old female never smoker followed for OSA, complicated by Obesity, GERD, HBP, Asthma, Hyperlipidemia, Depression, R Breast CA,  Endometriosis, Covid infection [id number],  -Arnuity, Followed by Dr. Marchelle Gearing for moderate persistent asthma, on Arnuity CPAP auto 5-12/ [employer]-AirSense 11 AutoSet   Download compliance- 100%, AHI 0.4/ hr Body weight today- 210 lbs Covid vax- 5 Phizer Flu vax- pending at PCP Working with [facility] weight loss program, she has lost a lot of weight. She reduced her peak pressure when she began oral venting. Download reviewed. Now comfortable. Asthma good control. Will contact us for refills as needed. Uncomplicated R TKA/ Dr Eulah Pont  05/04/23-  57 year old female never smoker followed for OSA, complicated by Obesity, GERD, HBP, Asthma, Hyperlipidemia, Depression, R Breast CA,  Endometriosis, Covid infection [id number],  -Arnuity, Followed by Dr. Marchelle Gearing for moderate persistent asthma, on Arnuity CPAP auto 5-12/ [employer]-AirSense 11 AutoSet   Download compliance- Body weight today-   ROS-see HPI   + = positive Constitutional:    +weight loss, night sweats, fevers, chills, fatigue, lassitude. HEENT:    headaches, difficulty swallowing, tooth/dental problems, sore throat,       sneezing, itching, ear ache, nasal congestion, post nasal drip, snoring CV:    chest pain, orthopnea, PND, swelling in lower extremities, anasarca,                                                       dizziness, palpitations Resp:   shortness of breath with exertion or at rest.                productive cough,   non-productive cough, coughing up of blood.              change in color of mucus.  wheezing.   Skin:    rash or lesions. GI:  No-    heartburn, indigestion, abdominal pain, nausea, vomiting, diarrhea,                 change in bowel habits, loss of appetite GU: dysuria, change in color of urine, no urgency or frequency.   flank pain. MS:   joint pain, stiffness, decreased range of motion, back pain. Neuro-     nothing unusual Psych:  change in mood or affect.  depression or anxiety.   memory loss.  OBJ- Physical Exam General- Alert, Oriented, Affect-appropriate, Distress- none acute + obese Skin- rash-none, lesions- none, excoriation- none Lymphadenopathy- none Head- atraumatic            Eyes- Gross vision intact, PERRLA, conjunctivae and secretions clear            Ears- Hearing, canals-normal            Nose- Clear, no-Septal dev, mucus, polyps, erosion, perforation             Throat- Mallampati III-IV , mucosa clear , drainage- none, tonsils- atrophic Neck- flexible , trachea midline, no stridor , thyroid nl, carotid no bruit Chest - symmetrical excursion , unlabored           Heart/CV- RRR , no murmur , no gallop  , no rub, nl s1 s2                           -  JVD- none , edema- none, stasis changes- none, varices- none           Lung- clear to P&A, wheeze- none, cough- none , dullness-none, rub- none           Chest wall-  Abd-  Br/ Gen/ Rectal- Not done, not indicated Extrem- cyanosis- none, clubbing, none, atrophy- none, strength- nl Neuro- grossly intact to observation

## 2023-05-03 DIAGNOSIS — F332 Major depressive disorder, recurrent severe without psychotic features: Secondary | ICD-10-CM | POA: Diagnosis not present

## 2023-05-03 NOTE — Telephone Encounter (Signed)
Pt called back and she does need a refill on her gabapentin now, she says she cannot wait until her appointment.

## 2023-05-04 ENCOUNTER — Ambulatory Visit: Payer: BC Managed Care – PPO | Admitting: Internal Medicine

## 2023-05-04 ENCOUNTER — Other Ambulatory Visit: Payer: Self-pay | Admitting: Family Medicine

## 2023-05-04 ENCOUNTER — Encounter: Payer: Self-pay | Admitting: Internal Medicine

## 2023-05-04 VITALS — BP 120/74 | HR 75 | Ht 64.0 in | Wt 195.4 lb

## 2023-05-04 DIAGNOSIS — J4520 Mild intermittent asthma, uncomplicated: Secondary | ICD-10-CM | POA: Diagnosis not present

## 2023-05-04 DIAGNOSIS — G4733 Obstructive sleep apnea (adult) (pediatric): Secondary | ICD-10-CM

## 2023-05-04 NOTE — Assessment & Plan Note (Signed)
Mild intermittent uncomplicated. Not needing routine inhaler use, but can return to Arnuity if needed.

## 2023-05-04 NOTE — Patient Instructions (Addendum)
Glad you are doing well-   We can continue  CPAP auto 5-11  We can refill Arnuity as needed

## 2023-05-04 NOTE — Assessment & Plan Note (Signed)
Benefits from CPAP with good compliance and control. Plan- continue auto 5-12 

## 2023-05-05 DIAGNOSIS — F332 Major depressive disorder, recurrent severe without psychotic features: Secondary | ICD-10-CM | POA: Diagnosis not present

## 2023-05-11 DIAGNOSIS — F332 Major depressive disorder, recurrent severe without psychotic features: Secondary | ICD-10-CM | POA: Diagnosis not present

## 2023-05-12 DIAGNOSIS — F332 Major depressive disorder, recurrent severe without psychotic features: Secondary | ICD-10-CM | POA: Diagnosis not present

## 2023-05-14 DIAGNOSIS — F332 Major depressive disorder, recurrent severe without psychotic features: Secondary | ICD-10-CM | POA: Diagnosis not present

## 2023-05-17 DIAGNOSIS — F332 Major depressive disorder, recurrent severe without psychotic features: Secondary | ICD-10-CM | POA: Diagnosis not present

## 2023-05-18 ENCOUNTER — Encounter: Payer: Self-pay | Admitting: *Deleted

## 2023-05-18 DIAGNOSIS — F332 Major depressive disorder, recurrent severe without psychotic features: Secondary | ICD-10-CM | POA: Diagnosis not present

## 2023-05-18 NOTE — Progress Notes (Unsigned)
No chief complaint on file.  Patient presents for 6 month follow-up on chronic problems.  Hypertension.   At her last visit we discussed possibly tapering meds if SBP<105. Pt prefers taper amlodipine (to stop cutting tabs) rather than hydrochlorothiazide. She is currently taking 50mg  losartan, 12.5mg  HCTZ and amlodipine 7.5mg . She is tolerating this without side effects.   She denies headaches, chest pain, shortness of breath, edema. BP's are   BP Readings from Last 3 Encounters:  05/04/23 120/74  11/16/22 100/60  06/17/22 136/66    Mixed hyperlipidemia--She continues on atorvastatin 80mg  daily and denies side effects.  She is taking OTC omega-3 fish oil 2000mg  BID (Vascepa and Lovaza weren't covered by her insurance).  She tries to follow a lowfat diet.  Lipids last checked through [facility] in June 2024:  TC 149, TG 214, HDL 44, LDL 74 Prior value: Lab Results  Component Value Date   CHOL 128 05/08/2022   HDL 41 05/08/2022   LDLCALC 61 05/08/2022   TRIG 151 (H) 05/08/2022   CHOLHDL 3.1 05/08/2022      Diabetes: She had prediabetes for many years, A1c went up to 6.5% in 10/2021, and up to 6.6% when checked shortly after by the bariatric clinic.   She is currently on ozempic 1 mg  and Metformin, denies side effects. ***UPDATE DOSE It was 5.5% on 10/14/22 at [facility]    Obesity:  She continues to go to [facility] bariatric clinic.   She eats 3 meal replacements, 1 full meal, 1 snack. She continues to drink  64 oz of water daily. She has constipation d/t ozempic. Taking Miralax  Wt Readings from Last 3 Encounters:  05/04/23 195 lb 6.4 oz (88.6 kg)  03/08/23 204 lb 8 oz (92.8 kg)  11/16/22 204 lb 3.2 oz (92.6 kg)  Weight 250# 3.2 oz in 04/2021. (Pt states max was 262#)   She is under the care of pulmonary for asthma and OSA. She is doing well on Arnuity, rarely needing rescue inhaler. She sees Dr. Maple Hudson for sleep apnea, doing well on CPAP.   Breast cancer--Doing well on anastrozole.  Tolerating side effects. Last saw oncology in 05/2022, surgeon in 06/2022, has appts scheduled for 05/2023 and 06/2023 respectively.  Last mammogram was in October. She required biopsy of mass on R breast.  Pathology was benign--organizing fat necrosis with hyalinizing fibrosis and calcifications.  Negative for atypica and malignancy. She is scheduled for 6 mo f/u R breast imaging in April 2025.   Chronic back pain: She had bilateral RF ablation in July 2023, repeated in 09/2022.  At last visit she was taking diclofenac once daily and Tylenol Arthritis in the morning. She was encouraged to take the Tylenol BID, and use the diclofenac just prn. Last filled #60 on 03/22/23.  ***UPDATE She has had to see Dr. Dulce Sellar related to epigastric pain, treated with carafate and omeprazole dose was increased to 20 mg BID (in 07/2022).  Symptoms improved.   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 Colonoscopy was notable for small internal hemorrhoids.  7 year f/u was recommended.   Depression: She continues to see psychiatrist (Dr. Evelene Croon) and counselor.  She is now taking Spravato 1-2x/week and switched from Pristiq to [employer] 2x/day  She is still trying to practice gratitude and mindfulness. In May, she parted ways with her therapist. Seeing therapist through [facility] clinic? UPDATE ***     PMH, PSH, SH reviewed    ROS: no fever,  chills, URI symptoms.  Breathing is at baseline No chest pain, palpitations, edema No urinary issues. No bleeding, bruising, rash +intentional weight loss Knee pain improved,  s/p TKR, some pain with weather changes Back pain   Depression Dizziness? Epigastric pain?   PHYSICAL EXAM:  LMP 01/27/2009 (Exact Date)   Wt Readings from Last 3 Encounters:  05/04/23 195 lb 6.4 oz (88.6 kg)  03/08/23 204 lb 8 oz (92.8 kg)  11/16/22 204 lb 3.2 oz (92.6 kg)   Pleasant female in no distress HEENT: conjunctiva and sclera are  clear, EOMI. OP clear Neck: no lymphadenopathy, thyromegaly or mass, no bruit Heart: regular rate and rhythm, no murmur Lungs: clear, no wheezing Back: no CVA tenderness. No spinal tenderness, muscle spasm Abdomen: soft, obese, nontender Extremities: no edema, normal pulses. Skin: normal turgor, no visible rash Neuro: alert and oriented, normal gait Psych:  Normal eye contact, speech, hygiene and grooming    ASSESSMENT/PLAN:  Flu, COVID TG elevated on last check in June.  Fasting today to recheck lipids??  Is ozempic still 0.5, or did she increase to 1mg ? Update psych meds--Spravato, switched from Pristiq to Auvelity 2x/day   Should need amlodipine RF? Did she decrease dose to just 5mg  daily (from 7.5?)--didn't as per last review of BP's in June.

## 2023-05-19 ENCOUNTER — Ambulatory Visit: Payer: BC Managed Care – PPO | Admitting: Family Medicine

## 2023-05-19 ENCOUNTER — Encounter: Payer: Self-pay | Admitting: Family Medicine

## 2023-05-19 VITALS — BP 110/64 | HR 68 | Ht 64.0 in | Wt 187.8 lb

## 2023-05-19 DIAGNOSIS — E782 Mixed hyperlipidemia: Secondary | ICD-10-CM

## 2023-05-19 DIAGNOSIS — E1159 Type 2 diabetes mellitus with other circulatory complications: Secondary | ICD-10-CM

## 2023-05-19 DIAGNOSIS — I152 Hypertension secondary to endocrine disorders: Secondary | ICD-10-CM

## 2023-05-19 DIAGNOSIS — E118 Type 2 diabetes mellitus with unspecified complications: Secondary | ICD-10-CM | POA: Diagnosis not present

## 2023-05-19 DIAGNOSIS — I10 Essential (primary) hypertension: Secondary | ICD-10-CM | POA: Diagnosis not present

## 2023-05-19 DIAGNOSIS — Z23 Encounter for immunization: Secondary | ICD-10-CM

## 2023-05-19 DIAGNOSIS — E785 Hyperlipidemia, unspecified: Secondary | ICD-10-CM

## 2023-05-19 DIAGNOSIS — Z5181 Encounter for therapeutic drug level monitoring: Secondary | ICD-10-CM

## 2023-05-19 DIAGNOSIS — G8929 Other chronic pain: Secondary | ICD-10-CM

## 2023-05-19 DIAGNOSIS — R3915 Urgency of urination: Secondary | ICD-10-CM | POA: Diagnosis not present

## 2023-05-19 DIAGNOSIS — M545 Low back pain, unspecified: Secondary | ICD-10-CM

## 2023-05-19 DIAGNOSIS — F339 Major depressive disorder, recurrent, unspecified: Secondary | ICD-10-CM

## 2023-05-19 DIAGNOSIS — E1169 Type 2 diabetes mellitus with other specified complication: Secondary | ICD-10-CM

## 2023-05-19 LAB — POCT URINALYSIS DIP (PROADVANTAGE DEVICE)
Bilirubin, UA: NEGATIVE
Blood, UA: NEGATIVE
Glucose, UA: NEGATIVE mg/dL
Ketones, POC UA: NEGATIVE mg/dL
Nitrite, UA: NEGATIVE
Protein Ur, POC: NEGATIVE mg/dL
Specific Gravity, Urine: 1.005
Urobilinogen, Ur: 0.2
pH, UA: 7.5 (ref 5.0–8.0)

## 2023-05-19 LAB — POCT GLYCOSYLATED HEMOGLOBIN (HGB A1C): Hemoglobin A1C: 5.4 % (ref 4.0–5.6)

## 2023-05-19 MED ORDER — DICLOFENAC SODIUM 75 MG PO TBEC: DELAYED_RELEASE_TABLET | ORAL | 2 refills | Status: DC

## 2023-05-19 MED ORDER — AMLODIPINE BESYLATE 5 MG PO TABS: 5.0000 mg | ORAL_TABLET | Freq: Every day | ORAL | 1 refills | Status: DC

## 2023-05-19 NOTE — Patient Instructions (Addendum)
You may try decreasing the amlodipine to 5 mg (one tablet only), in addition to the losartan and hydrochlorothiazide.  Monitor your blood pressure periodically at home. If you see persistent BP's of 130/80 or higher, then we should restart the extra 1/2 tablet.  Your A1c was 5.4%. Continue on the Ozempic, and ask whether or not the metformin could/should be discontinued.  Continue to try and limit use of diclofenac orally. Consider topical medications for the back (SalonPas, biofreeze or icy hot). If you develop pain in OTHER areas, use topical voltaren gel, rather than increasing the diclofenac to twice daily. Continue to take the omeprazole daily . Increase the dose back to twice daily if you have recurrent upper stomach pain.  Try stopping the carafate.  Have it around in case your stomach pain flares again..  We sent new prescription for amlodipine, 5 mg once daily.  If you end up restarting the 7.5 mg dose, don't fill the prescription at the pharmacy for #90, let us know and we can refill the prior dose (7.5 for #135).

## 2023-05-20 DIAGNOSIS — F332 Major depressive disorder, recurrent severe without psychotic features: Secondary | ICD-10-CM | POA: Diagnosis not present

## 2023-05-20 LAB — LIPID PANEL
Chol/HDL Ratio: 3 ratio (ref 0.0–4.4)
Cholesterol, Total: 124 mg/dL (ref 100–199)
HDL: 42 mg/dL (ref >39)
LDL Chol Calc (NIH): 60 mg/dL (ref 0–99)
Triglycerides: 120 mg/dL (ref 0–149)
VLDL Cholesterol Cal: 22 mg/dL (ref 5–40)

## 2023-05-20 LAB — BASIC METABOLIC PANEL
BUN/Creatinine Ratio: 20 (ref 9–23)
BUN: 18 mg/dL (ref 6–24)
CO2: 25 mmol/L (ref 20–29)
Calcium: 9.9 mg/dL (ref 8.7–10.2)
Chloride: 101 mmol/L (ref 96–106)
Creatinine, Ser: 0.9 mg/dL (ref 0.57–1.00)
Glucose: 87 mg/dL (ref 70–99)
Potassium: 4.1 mmol/L (ref 3.5–5.2)
Sodium: 140 mmol/L (ref 134–144)
eGFR: 74 mL/min/{1.73_m2} (ref >59)

## 2023-05-21 LAB — URINE CULTURE

## 2023-05-24 ENCOUNTER — Encounter: Payer: Self-pay | Admitting: *Deleted

## 2023-05-24 DIAGNOSIS — F332 Major depressive disorder, recurrent severe without psychotic features: Secondary | ICD-10-CM | POA: Diagnosis not present

## 2023-05-25 DIAGNOSIS — F332 Major depressive disorder, recurrent severe without psychotic features: Secondary | ICD-10-CM | POA: Diagnosis not present

## 2023-05-31 DIAGNOSIS — F411 Generalized anxiety disorder: Secondary | ICD-10-CM | POA: Diagnosis not present

## 2023-05-31 DIAGNOSIS — F3341 Major depressive disorder, recurrent, in partial remission: Secondary | ICD-10-CM | POA: Diagnosis not present

## 2023-06-01 DIAGNOSIS — F332 Major depressive disorder, recurrent severe without psychotic features: Secondary | ICD-10-CM | POA: Diagnosis not present

## 2023-06-01 DIAGNOSIS — E66811 Obesity, class 1: Secondary | ICD-10-CM | POA: Diagnosis not present

## 2023-06-01 DIAGNOSIS — Z713 Dietary counseling and surveillance: Secondary | ICD-10-CM | POA: Diagnosis not present

## 2023-06-02 DIAGNOSIS — F332 Major depressive disorder, recurrent severe without psychotic features: Secondary | ICD-10-CM | POA: Diagnosis not present

## 2023-06-02 DIAGNOSIS — H43813 Vitreous degeneration, bilateral: Secondary | ICD-10-CM | POA: Diagnosis not present

## 2023-06-03 DIAGNOSIS — M47816 Spondylosis without myelopathy or radiculopathy, lumbar region: Secondary | ICD-10-CM | POA: Diagnosis not present

## 2023-06-03 DIAGNOSIS — M7061 Trochanteric bursitis, right hip: Secondary | ICD-10-CM | POA: Diagnosis not present

## 2023-06-03 DIAGNOSIS — M7062 Trochanteric bursitis, left hip: Secondary | ICD-10-CM | POA: Diagnosis not present

## 2023-06-04 DIAGNOSIS — F332 Major depressive disorder, recurrent severe without psychotic features: Secondary | ICD-10-CM | POA: Diagnosis not present

## 2023-06-08 DIAGNOSIS — F332 Major depressive disorder, recurrent severe without psychotic features: Secondary | ICD-10-CM | POA: Diagnosis not present

## 2023-06-09 DIAGNOSIS — F332 Major depressive disorder, recurrent severe without psychotic features: Secondary | ICD-10-CM | POA: Diagnosis not present

## 2023-06-10 DIAGNOSIS — M7062 Trochanteric bursitis, left hip: Secondary | ICD-10-CM | POA: Diagnosis not present

## 2023-06-10 DIAGNOSIS — M7061 Trochanteric bursitis, right hip: Secondary | ICD-10-CM | POA: Diagnosis not present

## 2023-06-15 ENCOUNTER — Telehealth: Payer: Self-pay | Admitting: Hematology and Oncology

## 2023-06-15 DIAGNOSIS — E109 Type 1 diabetes mellitus without complications: Secondary | ICD-10-CM | POA: Diagnosis not present

## 2023-06-15 DIAGNOSIS — G4733 Obstructive sleep apnea (adult) (pediatric): Secondary | ICD-10-CM | POA: Diagnosis not present

## 2023-06-15 DIAGNOSIS — F332 Major depressive disorder, recurrent severe without psychotic features: Secondary | ICD-10-CM | POA: Diagnosis not present

## 2023-06-15 DIAGNOSIS — F411 Generalized anxiety disorder: Secondary | ICD-10-CM | POA: Diagnosis not present

## 2023-06-15 NOTE — Telephone Encounter (Signed)
Spoke with patient confirming upcoming appointment change

## 2023-06-16 DIAGNOSIS — F332 Major depressive disorder, recurrent severe without psychotic features: Secondary | ICD-10-CM | POA: Diagnosis not present

## 2023-06-18 ENCOUNTER — Inpatient Hospital Stay: Payer: BC Managed Care – PPO | Admitting: Hematology and Oncology

## 2023-06-20 ENCOUNTER — Other Ambulatory Visit: Payer: Self-pay | Admitting: Family Medicine

## 2023-06-20 DIAGNOSIS — I10 Essential (primary) hypertension: Secondary | ICD-10-CM

## 2023-06-29 ENCOUNTER — Encounter: Payer: Self-pay | Admitting: Family Medicine

## 2023-07-05 ENCOUNTER — Inpatient Hospital Stay: Payer: BC Managed Care – PPO | Admitting: Hematology and Oncology

## 2023-07-13 ENCOUNTER — Inpatient Hospital Stay: Payer: BC Managed Care – PPO | Attending: Hematology and Oncology | Admitting: Hematology and Oncology

## 2023-07-13 ENCOUNTER — Encounter: Payer: Self-pay | Admitting: Hematology and Oncology

## 2023-07-13 VITALS — BP 133/68 | HR 88 | Temp 97.3°F | Resp 16 | Wt 184.8 lb

## 2023-07-13 DIAGNOSIS — Z170 Estrogen receptor positive status [ER+]: Secondary | ICD-10-CM | POA: Insufficient documentation

## 2023-07-13 DIAGNOSIS — Z7985 Long-term (current) use of injectable non-insulin antidiabetic drugs: Secondary | ICD-10-CM | POA: Insufficient documentation

## 2023-07-13 DIAGNOSIS — E119 Type 2 diabetes mellitus without complications: Secondary | ICD-10-CM | POA: Diagnosis not present

## 2023-07-13 DIAGNOSIS — C50411 Malignant neoplasm of upper-outer quadrant of right female breast: Secondary | ICD-10-CM | POA: Diagnosis not present

## 2023-07-13 DIAGNOSIS — G8929 Other chronic pain: Secondary | ICD-10-CM | POA: Diagnosis not present

## 2023-07-13 DIAGNOSIS — Z79811 Long term (current) use of aromatase inhibitors: Secondary | ICD-10-CM | POA: Insufficient documentation

## 2023-07-13 DIAGNOSIS — M549 Dorsalgia, unspecified: Secondary | ICD-10-CM | POA: Diagnosis not present

## 2023-07-13 DIAGNOSIS — Z800 Family history of malignant neoplasm of digestive organs: Secondary | ICD-10-CM | POA: Diagnosis not present

## 2023-07-13 DIAGNOSIS — Z923 Personal history of irradiation: Secondary | ICD-10-CM | POA: Insufficient documentation

## 2023-07-13 DIAGNOSIS — Z79899 Other long term (current) drug therapy: Secondary | ICD-10-CM | POA: Insufficient documentation

## 2023-07-13 NOTE — Progress Notes (Signed)
 [facility]  [phone] [phone]     ID: Susan Gould DOB: 03-Feb-1966  MR#: [id number]  RDW#:[id number]  Patient Care Team: Randol Dawes, MD as PCP - General (Family Medicine) Vernetta Berg, MD as Consulting Physician (General Surgery) Shannon Agent, MD as Consulting Physician (Radiation Oncology) Margeret Kotyk, MD as Consulting Physician (Physical Medicine and Rehabilitation) Henry Slough, MD as Consulting Physician (Obstetrics and Gynecology) Burnette Fallow, MD as Consulting Physician (Gastroenterology) Neysa Reggy BIRCH, MD as Consulting Physician (Pulmonary Disease) Maree Lonni Inks, MD as Consulting Physician (Ophthalmology) Loretha Ash, MD as Consulting Physician (Hematology and Oncology) Ash Loretha, MD OTHER MD:  CHIEF COMPLAINT: estrogen receptor positive breast cancer  CURRENT TREATMENT: anastrozole   INTERVAL HISTORY:  Discussed the use of AI scribe software for clinical note transcription with the patient, who gave verbal consent to proceed.  History of Present Illness    The patient, with a history of breast cancer, presents with increasing intensity of hot flashes over the past six months. She notes a temporal pattern, with the hot flashes typically occurring in the late afternoon and manifesting as a flush. She has been on anastrozole  for approximately two and a half years, with no other side effects reported. The only recent change in her medication regimen is a switch from Pristiq to Arnuity for depression. She also reports chronic back issues, including herniated discs, for which she has had radiofrequency ablations and epidural shots in the past. She recently had an MRI to assess the current state of her back issues. She has lost significant weight (almost 80 pounds) since starting Ozempic for diabetes, and has discontinued metformin . Rest of the pertinent 10 point ROS reviewed and negative.  REVIEW OF  SYSTEMS: Review of Systems  Constitutional:  Negative for appetite change, chills, fatigue, fever and unexpected weight change.  HENT:   Negative for hearing loss, lump/mass and trouble swallowing.   Eyes:  Negative for eye problems and icterus.  Respiratory:  Negative for chest tightness, cough and shortness of breath.   Cardiovascular:  Negative for chest pain, leg swelling and palpitations.  Gastrointestinal:  Negative for abdominal distention, abdominal pain, constipation, diarrhea, nausea and vomiting.  Endocrine: Positive for hot flashes (occasional).  Genitourinary:  Negative for difficulty urinating.   Musculoskeletal:  Negative for arthralgias.  Skin:  Negative for itching and rash.  Neurological:  Negative for dizziness, extremity weakness, headaches and numbness.  Hematological:  Negative for adenopathy. Does not bruise/bleed easily.  Psychiatric/Behavioral:  Negative for depression. The patient is not nervous/anxious.      COVID 19 VACCINATION STATUS: Pfizer x4, most recently 09/2020; infection 12/2020   HISTORY OF CURRENT ILLNESS: From the original intake note:  Susan Gould herself palpated a right breast abnormality. Of note, screening mammogram on 05/31/2020 was negative. She underwent right diagnostic mammography with tomography and right breast ultrasonography at [facility] on 08/29/2020 showing: breast density category B; palpable indeterminate 0.6 cm mass in right breast at 12 o'clock retroareolar region; right axilla negative for adenopathy.  Accordingly on 09/02/2020 she proceeded to biopsy of the right breast area in question. The pathology from this procedure ([id number]) showed: invasive ductal carcinoma, grade 2; ductal papilloma. Prognostic indicators significant for: estrogen receptor, >95% positive with strong staining intensity and progesterone receptor, 15% positive with moderate staining intensity. Proliferation marker Ki67 at 30%. HER2 equivocal by  immunohistochemistry (2+), but negative by fluorescent in situ hybridization with a signals ratio 0.92 and number per cell 1.7.  Cancer Staging  Malignant neoplasm of upper-outer quadrant of right breast in female, estrogen receptor positive (HCC) Staging form: Breast, AJCC 8th Edition - Clinical stage from 09/11/2020: Stage IA (cT1b, cN0, cM0, G2, ER+, PR+, HER2-) - Signed by Layla Sandria BROCKS, MD on 09/11/2020 Stage prefix: Initial diagnosis Histologic grading system: 3 grade system Laterality: Right Staged by: Pathologist and managing physician Stage used in treatment planning: Yes National guidelines used in treatment planning: Yes Type of national guideline used in treatment planning: NCCN  The patient's subsequent history is as detailed below.   PAST MEDICAL HISTORY: Past Medical History:  Diagnosis Date   Allergic rhinitis, cause unspecified    cat/dog/ragweed/mold/wool, s/p immunotherapy x 3-4 yrs   Anemia    Anxiety 2006   Asthma    (Dr. Brien)   Breast cancer [facility]) 06/2020   Colon polyps 2007   Dr. Celestia (she was told repeat in 10 years)   COVID-19 virus infection 12/2020   DDD (degenerative disc disease), lumbosacral    Depression 2006   Endometriosis    Dr. Raeanne   Family history of breast cancer in female    Fatty liver    GERD (gastroesophageal reflux disease)    Heart murmur    Hemorrhoid    internal and external   History of radiation therapy 12/06/2020   right breast 11/07/2020-12/06/2020  Dr Lynwood Nasuti   Hyperlipidemia    Hypertension    Insulin  resistance    Dr. Raeanne   Medial meniscus tear    Left. Surgery Rolan Chancy.  NOT seen on MRI 06/2013   Obesity    OSA (obstructive sleep apnea)    on CPAP (Dr. Corrie)    PAST SURGICAL HISTORY: Past Surgical History:  Procedure Laterality Date   BREAST BIOPSY Right 04/20/2023   US  RT BREAST BX W LOC DEV 1ST LESION IMG BX SPEC US  GUIDE 04/20/2023 [facility] MAMMOGRAPHY   BREAST LUMPECTOMY WITH  SENTINEL LYMPH NODE BIOPSY Right 10/03/2020   Procedure: RIGHT BREAST LUMPECTOMY WITH SENTINEL LYMPH NODE BIOPSY;  Surgeon: Vernetta Berg, MD;  Location: [facility];  Service: General;  Laterality: Right;   CARPAL TUNNEL RELEASE Right 12/28/2019   Dr. Chancy   CARPAL TUNNEL RELEASE Left 02/22/2020   CHOLECYSTECTOMY  1986   EYE SURGERY  2000   FINGER GANGLION CYST EXCISION  2003   RIGHT 3rd finger   JOINT REPLACEMENT  2017   KNEE ARTHROSCOPY Right 04/17/2021   Dr. Velinda Chancy (torn meniscus)   KNEE SURGERY  '95 and '97   BILATERAL KNEE   LAPAROSCOPY  '98, '01   endometriosis (Dr. Raeanne)   LASIK  2000   RADIOFREQUENCY ABLATION Bilateral 12/2021   for back pain   tibial tuberoplasty  90's   left   TOTAL KNEE ARTHROPLASTY Right 03/17/2022   Procedure: RIGHT TOTAL KNEE ARTHROPLASTY;  Surgeon: Chancy Evalene BIRCH, MD;  Location: [facility];  Service: Orthopedics;  Laterality: Right;    FAMILY HISTORY: Family History  Problem Relation Age of Onset   Diabetes Mother    Hypertension Mother    Hyperlipidemia Mother    Arthritis Mother    Cancer Mother 40       breast cancer   Breast cancer Mother 29   Diabetes Father    Hyperlipidemia Father    Hypertension Father    Asthma Father    Arthritis Father    Heart disease Father        CHF, hypokinesis   COPD  Father        due to asbestos exposure; former smoker   Pulmonary embolism Father    Parkinson's disease Father    Hypertension Sister    Colon cancer Maternal Grandmother        late 60's   Cancer Maternal Grandmother        colon   Heart disease Maternal Grandmother        MI in 40's   Breast cancer Paternal Aunt        11's   Cancer Paternal Aunt        breast   Heart disease Paternal Uncle    Diabetes Paternal Uncle    Kidney disease Paternal Uncle   Her father died at age 41 from COPD and Parkinson's. Her mother died at age 43 from a perforated colon. Susan Gould has one brother and three sisters, all  living as of 08/2020. She reports breast cancer in her mother at age 9 and in a paternal aunt. She also notes colon cancer in her maternal grandmother in her early 4's.    GYNECOLOGIC HISTORY:  Patient's last menstrual period was 01/27/2009 (exact date). Menarche: 58 years old GX P 0 LMP 01/2009 Contraceptive: used for years and stopped in the early 2000's. HRT never used  Hysterectomy? no BSO? 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 She is single. She lives by herself, with her cat.  She is not a advice worker.    ADVANCED DIRECTIVES: not in place; believes she has named her brother Omari Koslosky, who lives in [place] and can be reached at [phone]. Her sister Doyal seconds.   HEALTH MAINTENANCE: Social History   Tobacco Use   Smoking status: Never   Smokeless tobacco: Never  Vaping Use   Vaping status: Never Used  Substance Use Topics   Alcohol use: Yes    Alcohol/week: 0.0 standard drinks of alcohol    Comment: 1-2 drinks a month   Drug use: No     Colonoscopy: 05/2018 (Dr. Celestia), normal, recall 2024 for family history  PAP: 06/2020, negative  Bone density: 03/2015, +0.5   Allergies  Allergen Reactions   Mobic [Meloxicam] Itching and Rash    Current Outpatient Medications  Medication Sig Dispense Refill   acetaminophen  (TYLENOL ) 650 MG CR tablet Take 1,300 mg by mouth every 8 (eight) hours as needed for pain.     albuterol  (VENTOLIN  HFA) 108 (90 Base) MCG/ACT inhaler Inhale 2 puffs into the lungs every 6 (six) hours as needed for wheezing or shortness of breath. (Patient not taking: Reported on 05/19/2023) 8 g 2   ALPRAZolam  (XANAX ) 0.5 MG tablet TAKE 1/2 -1 TABLET BY MOUTH 3 TIMES DAILY AS NEEDED FOR ANXIETY (Patient not taking: Reported on 05/19/2023) 20 tablet 0   amLODipine  (NORVASC ) 5 MG tablet Take 1 tablet (5 mg total) by mouth daily. 90 tablet 1    anastrozole  (ARIMIDEX ) 1 MG tablet Take 1 tablet by mouth daily. 90 tablet 3   ARNUITY ELLIPTA  100 MCG/ACT AEPB INHALE 1 PUFF INTO THE LUNGS DAILY 30 each 3   atorvastatin  (LIPITOR) 80 MG tablet TAKE 1 TABLET(80 MG) BY MOUTH DAILY 90 tablet 3   AUVELITY 45-105 MG TBCR Take 1 tablet by mouth 2 (two) times daily.     betamethasone  dipropionate 0.05 % lotion Apply 1 Application topically daily as needed (irritation). (Patient not taking: Reported on 05/19/2023)  CALCIUM  PO Take 1,000 mg by mouth daily.     cetirizine (ZYRTEC) 10 MG tablet Take 10 mg by mouth daily.     cholecalciferol (VITAMIN D3) 25 MCG (1000 UNIT) tablet Take 1,000 Units by mouth daily.     diclofenac  (VOLTAREN ) 75 MG EC tablet TAKE 1 TABLET BY MOUTH 2 TIMES A DAY WITH FOOD FOR MILD OR MODERATE PAIN 60 tablet 2   Esketamine HCl, 56 MG Dose, (SPRAVATO, 56 MG DOSE,) 28 MG/DEVICE SOPK Place into the nose.     fluticasone  (FLONASE ) 50 MCG/ACT nasal spray INSTILL 1 TO 2 SPRAYS INTO EACH NOSTRIL ONCE A DAY 16 g 11   gabapentin  (NEURONTIN ) 300 MG capsule TAKE 1 CAPSULE BY MOUTH 2 TIMES A DAY 180 capsule 0   hydrochlorothiazide  (MICROZIDE ) 12.5 MG capsule TAKE 1 CAPSULE BY MOUTH DAILY 90 capsule 1   L-Methylfolate-Algae (DEPLIN 15 PO) Take 1 capsule by mouth daily.     losartan  (COZAAR ) 50 MG tablet TAKE 1 TABLET BY MOUTH DAILY 90 tablet 0   metFORMIN  (GLUMETZA ) 500 MG (MOD) 24 hr tablet Take 500 mg by mouth daily with breakfast.     Multiple Vitamins-Minerals (WOMENS 50+ MULTI VITAMIN PO) Take 1 tablet by mouth daily.     Omega-3 Fatty Acids (FISH OIL) 1000 MG CAPS Take 2,000 mg by mouth in the morning and at bedtime.     omeprazole (PRILOSEC) 20 MG capsule Take 20 mg by mouth daily.     ondansetron  (ZOFRAN -ODT) 4 MG disintegrating tablet Take 1 tablet (4 mg total) by mouth 2 (two) times daily as needed for nausea or vomiting. (Patient not taking: Reported on 05/19/2023) 10 tablet 0   OZEMPIC, 1 MG/DOSE, 4 MG/3ML SOPN Inject 1 mg into  the skin once a week.     polyethylene glycol (MIRALAX ) 17 g packet Take 17 g by mouth daily. to prevent constipation 14 each 0   sucralfate (CARAFATE) 1 g tablet Take 1 g by mouth 2 (two) times daily.     vitamin B-12 (CYANOCOBALAMIN) 1000 MCG tablet Take 1,000 mcg by mouth daily.     vitamin C (ASCORBIC ACID) 500 MG tablet Take 500 mg by mouth daily.     No current facility-administered medications for this visit.    OBJECTIVE: White woman who appears stated age  Vitals:   07/13/23 0831  BP: 133/68  Pulse: 88  Resp: 16  Temp: (!) 97.3 F (36.3 C)  SpO2: 97%      Body mass index is 31.72 kg/m.   Wt Readings from Last 3 Encounters:  07/13/23 184 lb 12.8 oz (83.8 kg)  05/19/23 187 lb 12.8 oz (85.2 kg)  05/04/23 195 lb 6.4 oz (88.6 kg)      ECOG FS:1 - Symptomatic but completely ambulatory   Physical Exam Constitutional:      Appearance: Normal appearance.  Chest:       Comments: Bilateral breasts inspected.  No palpable masses or regional adenopathy. Right breast periareolar scar noted.  Musculoskeletal:     Cervical back: Normal range of motion and neck supple. No rigidity.  Skin:    General: Skin is warm and dry.  Neurological:     Mental Status: She is alert.        LAB RESULTS:  CMP     Component Value Date/Time   NA 140 05/19/2023 1158   K 4.1 05/19/2023 1158   CL 101 05/19/2023 1158   CO2 25 05/19/2023 1158   GLUCOSE 87 05/19/2023  1158   GLUCOSE 99 03/06/2022 0918   BUN 18 05/19/2023 1158   CREATININE 0.90 05/19/2023 1158   CREATININE 1.04 (H) 09/11/2020 0830   CREATININE 0.97 03/09/2017 1110   CALCIUM  9.9 05/19/2023 1158   PROT 7.4 11/16/2022 1028   ALBUMIN 4.6 11/16/2022 1028   AST 38 11/16/2022 1028   AST 49 (H) 09/11/2020 0830   ALT 42 (H) 11/16/2022 1028   ALT 58 (H) 09/11/2020 0830   ALKPHOS 95 11/16/2022 1028   BILITOT 0.5 11/16/2022 1028   BILITOT 0.4 09/11/2020 0830   GFRNONAA >60 03/06/2022 0918   GFRNONAA >60 09/11/2020 0830    GFRAA 77 06/20/2020 1002    No results found for: TOTALPROTELP, ALBUMINELP, A1GS, A2GS, BETS, BETA2SER, GAMS, MSPIKE, SPEI  Lab Results  Component Value Date   WBC 6.5 03/06/2022   NEUTROABS 6.5 04/30/2021   HGB 13.4 03/06/2022   HCT 41.1 03/06/2022   MCV 93.0 03/06/2022   PLT 253 03/06/2022    No results found for: LABCA2  No components found for: [id number]  No results for input(s): INR in the last 168 hours.  No results found for: LABCA2  No results found for: RJW800  No results found for: CAN125  No results found for: CAN153  No results found for: CA2729  No components found for: HGQUANT  No results found for: CEA1, CEA / No results found for: CEA1, CEA   No results found for: AFPTUMOR  No results found for: CHROMOGRNA  No results found for: KPAFRELGTCHN, LAMBDASER, KAPLAMBRATIO (kappa/lambda light chains)  No results found for: HGBA, HGBA2QUANT, HGBFQUANT, HGBSQUAN (Hemoglobinopathy evaluation)   No results found for: LDH  Lab Results  Component Value Date   IRON 60 05/03/2019   TIBC 316 05/03/2019   IRONPCTSAT 19 05/03/2019   (Iron and TIBC)  Lab Results  Component Value Date   FERRITIN 42 05/03/2019    Urinalysis    Component Value Date/Time   LABSPEC 1.005 05/19/2023 1104   BILIRUBINUR negative 05/19/2023 1104   BILIRUBINUR neg 03/12/2016 0854   KETONESUR negative 05/19/2023 1104   PROTEINUR negative 05/19/2023 1104   PROTEINUR neg 03/12/2016 0854   UROBILINOGEN negative 03/12/2016 0854   NITRITE Negative 05/19/2023 1104   NITRITE neg 03/12/2016 0854   LEUKOCYTESUR Trace (A) 05/19/2023 1104    STUDIES: No results found.   ELIGIBLE FOR AVAILABLE RESEARCH PROTOCOL:no  ASSESSMENT: 59 y.o. [place] woman status post right breast upper outer quadrant biopsy 09/02/2020 for a clinical T1b N0, stage IA invasive ductal carcinoma, grade 2, estrogen and progesterone  receptor positive, HER-2 not amplified, with an MIB-1 of 30%  (1) status post right lumpectomy and sentinel lymph node sampling 10/03/2020 for a pT1c pN0, stage IA invasive ductal carcinoma, grade 2, with negative margins  (a) a single right axillary lymph node was removed  (2) Oncotype score of 25 predicted a risk of recurrence outside the breast in the next 9 years of 12% if the patient's only systemic therapy is antiestrogens for 5 years.  It predicts a less than 1% chemotherapy benefit  (3) adjuvant radiation to be completed 12/06/2020  (4) to start anastrozole  12/31/2020  (a) DEXA scan 04/22/2015  (b) DEXA scan January 2023, normal  PLAN:  Breast Cancer On Anastrozole  since July 2022 with increased hot flashes in the last six months. No new side effects. Discussed the duration of Anastrozole  therapy (5 years minimum) and factors that might influence the decision to continue beyond 5 years. -Continue Anastrozole . -Next mammogram  scheduled for April 2025.  Diabetes Well controlled on Ozempic with significant weight loss (~80 lbs). Metformin  discontinued. -Continue Ozempic.  Chronic Back Pain History of herniated discs with recent MRI. Has had radiofrequency ablations and epidural shots in the past with some relief. -Continue current management as directed by treating physician.  Osteoporosis Screening Last bone density scan in January 2023 was good. Due for repeat scan. -Order bone density scan.  General Health Maintenance -Continue Vitamin D3 and Calcium  supplementation. -Encourage continued physical activity. -Annual follow-up visit scheduled.  Total time spent: 30 minutes  *Total Encounter Time as defined by the Centers for Medicare and Medicaid Services includes, in addition to the face-to-face time of a patient visit (documented in the note above) non-face-to-face time: obtaining and reviewing outside history, ordering and reviewing medications, tests or procedures,  care coordination (communications with other health care professionals or caregivers) and documentation in the medical record.

## 2023-07-18 ENCOUNTER — Other Ambulatory Visit: Payer: Self-pay | Admitting: Family Medicine

## 2023-07-18 DIAGNOSIS — I10 Essential (primary) hypertension: Secondary | ICD-10-CM

## 2023-07-18 DIAGNOSIS — E782 Mixed hyperlipidemia: Secondary | ICD-10-CM

## 2023-07-28 ENCOUNTER — Ambulatory Visit (HOSPITAL_BASED_OUTPATIENT_CLINIC_OR_DEPARTMENT_OTHER): Payer: BC Managed Care – PPO

## 2023-07-30 ENCOUNTER — Other Ambulatory Visit: Payer: Self-pay | Admitting: Family Medicine

## 2023-08-04 ENCOUNTER — Ambulatory Visit (HOSPITAL_BASED_OUTPATIENT_CLINIC_OR_DEPARTMENT_OTHER)
Admission: RE | Admit: 2023-08-04 | Discharge: 2023-08-04 | Disposition: A | Discharge status: Home / Self Care | Payer: BC Managed Care – PPO | Source: Ambulatory Visit | Attending: Hematology and Oncology | Admitting: Hematology and Oncology

## 2023-08-04 DIAGNOSIS — C50411 Malignant neoplasm of upper-outer quadrant of right female breast: Secondary | ICD-10-CM | POA: Insufficient documentation

## 2023-08-04 DIAGNOSIS — Z170 Estrogen receptor positive status [ER+]: Secondary | ICD-10-CM | POA: Diagnosis present

## 2023-09-06 ENCOUNTER — Ambulatory Visit: Payer: BC Managed Care – PPO | Attending: Surgery

## 2023-09-06 VITALS — Wt 187.1 lb

## 2023-09-06 DIAGNOSIS — Z483 Aftercare following surgery for neoplasm: Secondary | ICD-10-CM | POA: Insufficient documentation

## 2023-09-06 NOTE — Therapy (Addendum)
 OUTPATIENT PHYSICAL THERAPY SOZO SCREENING NOTE   Patient Name: Susan Gould MRN: [id number] DOB:Feb 08, 1966, 58 y.o., female Today's Date: 09/06/2023  PCP: Joselyn Arrow, MD REFERRING PROVIDER: Abigail Miyamoto, MD   PT End of Session - 09/06/23 [phone]     Visit Number 2   # unchanged due to screen only   PT Start Time 0847    PT Stop Time 0851    PT Time Calculation (min) 4 min    Activity Tolerance Patient tolerated treatment well    Behavior During Therapy [facility] for tasks assessed/performed             Past Medical History:  Diagnosis Date   Allergic rhinitis, cause unspecified    cat/dog/ragweed/mold/wool, s/p immunotherapy x 3-4 yrs   Anemia    Anxiety 2006   Asthma    (Dr. Delford Field)   Breast cancer [facility]) 06/2020   Colon polyps 2007   Dr. Randa Evens (she was told repeat in 10 years)   COVID-19 virus infection 12/2020   DDD (degenerative disc disease), lumbosacral    Depression 2006   Endometriosis    Dr. Pennie Rushing   Family history of breast cancer in female    Fatty liver    GERD (gastroesophageal reflux disease)    Heart murmur    Hemorrhoid    internal and external   History of radiation therapy 12/06/2020   right breast 11/07/2020-12/06/2020  Dr Antony Blackbird   Hyperlipidemia    Hypertension    Insulin resistance    Dr. Pennie Rushing   Medial meniscus tear    Left. Surgery Richardson Landry.  NOT seen on MRI 06/2013   Obesity    OSA (obstructive sleep apnea)    on CPAP (Dr. Shelle Iron)   Past Surgical History:  Procedure Laterality Date   BREAST BIOPSY Right 04/20/2023   [place] RT BREAST BX W LOC DEV 1ST LESION IMG BX SPEC US GUIDE 04/20/2023 [facility] MAMMOGRAPHY   BREAST LUMPECTOMY WITH SENTINEL LYMPH NODE BIOPSY Right 10/03/2020   Procedure: RIGHT BREAST LUMPECTOMY WITH SENTINEL LYMPH NODE BIOPSY;  Surgeon: Abigail Miyamoto, MD;  Location: [facility];  Service: General;  Laterality: Right;   CARPAL TUNNEL RELEASE Right 12/28/2019   Dr. Eulah Pont   CARPAL TUNNEL  RELEASE Left 02/22/2020   CHOLECYSTECTOMY  1986   EYE SURGERY  2000   FINGER GANGLION CYST EXCISION  2003   RIGHT 3rd finger   JOINT REPLACEMENT  2017   KNEE ARTHROSCOPY Right 04/17/2021   Dr. Renaye Rakers (torn meniscus)   KNEE SURGERY  '95 and '97   BILATERAL KNEE   LAPAROSCOPY  '98, '01   endometriosis (Dr. Pennie Rushing)   LASIK  2000   RADIOFREQUENCY ABLATION Bilateral 12/2021   for back pain   tibial tuberoplasty  90's   left   TOTAL KNEE ARTHROPLASTY Right 03/17/2022   Procedure: RIGHT TOTAL KNEE ARTHROPLASTY;  Surgeon: Sheral Apley, MD;  Location: [facility];  Service: Orthopedics;  Laterality: Right;   Patient Active Problem List   Diagnosis Date Noted   Diabetes mellitus type II, controlled (HCC) 11/14/2022   Hypertension associated with diabetes (HCC) 11/14/2022   Hyperlipidemia associated with type 2 diabetes mellitus (HCC) 11/14/2022   S/P total knee arthroplasty, right 03/17/2022   Malignant neoplasm of upper-outer quadrant of right breast in female, estrogen receptor positive (HCC) 09/09/2020   Steatohepatitis 07/25/2019   Acute exacerbation of moderate persistent extrinsic asthma 06/02/2016   Obesity due to excess calories with serious comorbidity 04/23/2016  Pre-operative clearance 09/23/2015   Mixed hyperlipidemia 07/29/2011   OA (osteoarthritis) of knee 07/29/2011   Morbid obesity with body mass index of 45.0-49.9 in adult [facility]) 03/17/2010   Insulin resistance syndrome 11/13/2009   HYPERLIPIDEMIA 11/13/2009   Anxiety state 11/13/2009   Essential hypertension 11/13/2009   Mild intermittent asthma 11/13/2009   GERD 11/13/2009   ENDOMETRIOSIS 11/13/2009   OSA (obstructive sleep apnea) 11/13/2009    REFERRING DIAG: right breast cancer at risk for lymphedema  THERAPY DIAG: Aftercare following surgery for neoplasm  PERTINENT HISTORY: Patient was diagnosed on 09/02/2020 with right grade II invasive ductal carcinoma breast cancer. She underwent a right lumpectomy  and sentinel node biopsy (1 negative) on 10/03/2020. It is ER/PR positive and HER2 negative with a Ki67 of 30%.   PRECAUTIONS: right UE Lymphedema risk, None  SUBJECTIVE: Pt returns for her 6 month L-Dex screen.   PAIN:  Are you having pain? No  SOZO SCREENING: Patient was assessed today using the SOZO machine to determine the lymphedema index score. This was compared to her baseline score. It was determined that she is within the recommended range when compared to her baseline and no further action is needed at this time. She will continue SOZO screenings. These are done every 3 months for 2 years post operatively followed by every 6 months for 2 years, and then annually.   L-DEX FLOWSHEETS - 09/06/23 0800       L-DEX LYMPHEDEMA SCREENING   Measurement Type Unilateral    L-DEX MEASUREMENT EXTREMITY Upper Extremity    POSITION  Standing    DOMINANT SIDE Right    At Risk Side Right    BASELINE SCORE (UNILATERAL) -1.2    L-DEX SCORE (UNILATERAL) 0    VALUE CHANGE (UNILAT) 1.2              Hermenia Bers, PTA 09/06/2023, 8:56 AM

## 2023-10-22 ENCOUNTER — Ambulatory Visit
Admission: RE | Admit: 2023-10-22 | Discharge: 2023-10-22 | Disposition: A | Discharge status: Home / Self Care | Payer: BC Managed Care – PPO | Source: Ambulatory Visit | Attending: Hematology and Oncology | Admitting: Hematology and Oncology

## 2023-10-22 ENCOUNTER — Ambulatory Visit: Payer: BC Managed Care – PPO

## 2023-10-22 DIAGNOSIS — Z170 Estrogen receptor positive status [ER+]: Secondary | ICD-10-CM

## 2023-10-29 ENCOUNTER — Other Ambulatory Visit: Payer: Self-pay | Admitting: Family Medicine

## 2023-10-29 NOTE — Telephone Encounter (Signed)
 Last appt 04/2023

## 2023-11-03 NOTE — Progress Notes (Unsigned)
 No chief complaint on file.  Patient presents for 6 month follow-up on chronic problems.   GYN 08/2023 Susan Gould      Hypertension: At her last visit we discussed possibly tapering meds if SBP<105. Pt preferred taper amlodipine  (to stop cutting tabs) rather than hydrochlorothiazide .  At her last visit she was having low BP's and dizziness, somewhat related to spravato treatments. She is currently taking ***  She denies headaches, chest pain, shortness of breath, edema.  BP's are running  BP Readings from Last 3 Encounters:  07/13/23 133/68  05/19/23 110/64  05/04/23 120/74    Mixed hyperlipidemia--She continues on atorvastatin  80mg  daily and denies side effects.  She is taking OTC omega-3 fish oil 2000mg  BID (Vascepa  and Lovaza  weren't covered by her insurance).  She tries to follow a lowfat diet. Portions are smaller due to the ozempic. Lipids were at goal on last check in November. Lab Results  Component Value Date   CHOL 124 05/19/2023   HDL 42 05/19/2023   LDLCALC 60 05/19/2023   TRIG 120 05/19/2023   CHOLHDL 3.0 05/19/2023     Diabetes: She had prediabetes for many years, A1c went up to 6.5% in 10/2021, and up to 6.6% when checked shortly after by the bariatric clinic.   Last A1c was 5.4% when on Ozempic 1 mg and metformin , in 04/2023.   ***UPDATE--dose changes, still on metformin ?  Sugars are running *** checked??    Obesity:  She continues to go to [facility] bariatric clinic, on Optifast and Ozempic. No labs checked with them since 11/2022. Working on reducing sugar intake (in tea). She met with dietician, NP and therapist through them last month. She eats 3 meal replacements, 1 full meal, 1 snack. ***UPDATE She continues to drink  64 oz of water  daily. She has constipation d/t ozempic. Taking Miralax     Wt Readings from Last 3 Encounters:  09/06/23 187 lb 2 oz (84.9 kg)  07/13/23 184 lb 12.8 oz (83.8 kg)  05/19/23 187 lb 12.8 oz (85.2 kg)  Weight 250# 3.2 oz in  04/2021. (Pt states max was 262#)   She is under the care of pulmonary for asthma and OSA. She is doing well on Arnuity, rarely needing rescue inhaler. She sees Dr. Linder Revere for sleep apnea, doing well on CPAP.   Breast cancer--Doing well on anastrozole . Tolerating side effects. Last saw oncology and surgeon in 06/2023.  Last mammogram was in 09/2023. Denies any breast concerns today.    Chronic back pain: She had bilateral RF ablation in July 2023, repeated in 09/2022.   We had encouraged her to use Tylenol  Arthritis BID and Diclofenac  just prn, but this didn't work for her.  She is taking Tylenol  Arthritis in the morning, diclofenac  once daily with dinner. Last filled #60 on 05/19/23 with 2 refills. She last saw Dr. Ibazebo in 05/2023 for LBP and bilateral troch bursitis. ***UPDATE   Epigastric pain, gastritis.  EGD in 01/2023 with Dr. Kimble Pennant. She is doing well on once daily omeprazole 20mg .  In 07/2022 had flare which required BID dosing, along with carafate. ***UPDATE She denies any dysphagia, indigestion, pain.  Depression: She continues to see psychiatrist (Dr. Deborra Falter) and counselor. She sees Syra Sickinger (virtually), along with therapist through the bariatric clinic at [facility]. She is now taking Spravato 1-2x/week (alternating, total of 6x/month) and Auvelity 2x/day. She reports doing very well, and denies side effects.     PMH, PSH, SH reviewed     ROS: no fever, chills,  URI symptoms.  Breathing is at baseline No chest pain, palpitations, edema No urinary issues. No bleeding, bruising, rash  Knee pain improved,  s/p TKR LBP Hip pain?  Depression--improving with change in meds and new therapist Some intermittent dizziness, and low BP's with spravato treatments. No further reflux or epigastric pain. +intentional weight loss ***   PHYSICAL EXAM:  LMP 01/27/2009 (Exact Date)   Wt Readings from Last 3 Encounters:  09/06/23 187 lb 2 oz (84.9 kg)  07/13/23 184 lb 12.8 oz (83.8  kg)  05/19/23 187 lb 12.8 oz (85.2 kg)   Pleasant female in no distress HEENT: conjunctiva and sclera are clear, EOMI. OP clear Neck: no lymphadenopathy, thyromegaly or mass, no bruit Heart: regular rate and rhythm, no murmur Lungs: clear, no wheezing Back: no CVA tenderness. No spinal tenderness, muscle spasm Abdomen: soft, obese, nontender Extremities: no edema, normal pulses. Skin: normal turgor, no visible rash Neuro: alert and oriented, normal gait Psych:  Normal eye contact, speech, hygiene and grooming    ASSESSMENT/PLAN:   Prevnar-20 A1c C-met, urine microalb, CBC, TSH Vit D? Last 11/2022 was 30   Dose of ozempic? Still on metformin ? Current dose of amlodipine ?  RF amlodipine ? Need RF diclofenac ?

## 2023-11-08 ENCOUNTER — Encounter: Payer: Self-pay | Admitting: Family Medicine

## 2023-11-08 ENCOUNTER — Ambulatory Visit (INDEPENDENT_AMBULATORY_CARE_PROVIDER_SITE_OTHER): Payer: BC Managed Care – PPO | Admitting: Family Medicine

## 2023-11-08 VITALS — BP 110/60 | HR 64 | Ht 64.0 in | Wt 187.8 lb

## 2023-11-08 DIAGNOSIS — E1169 Type 2 diabetes mellitus with other specified complication: Secondary | ICD-10-CM

## 2023-11-08 DIAGNOSIS — E118 Type 2 diabetes mellitus with unspecified complications: Secondary | ICD-10-CM | POA: Diagnosis not present

## 2023-11-08 DIAGNOSIS — E1159 Type 2 diabetes mellitus with other circulatory complications: Secondary | ICD-10-CM | POA: Diagnosis not present

## 2023-11-08 DIAGNOSIS — M545 Low back pain, unspecified: Secondary | ICD-10-CM

## 2023-11-08 DIAGNOSIS — G8929 Other chronic pain: Secondary | ICD-10-CM | POA: Insufficient documentation

## 2023-11-08 DIAGNOSIS — F339 Major depressive disorder, recurrent, unspecified: Secondary | ICD-10-CM | POA: Insufficient documentation

## 2023-11-08 DIAGNOSIS — I152 Hypertension secondary to endocrine disorders: Secondary | ICD-10-CM

## 2023-11-08 DIAGNOSIS — E782 Mixed hyperlipidemia: Secondary | ICD-10-CM | POA: Diagnosis not present

## 2023-11-08 DIAGNOSIS — Z23 Encounter for immunization: Secondary | ICD-10-CM | POA: Diagnosis not present

## 2023-11-08 DIAGNOSIS — I10 Essential (primary) hypertension: Secondary | ICD-10-CM | POA: Diagnosis not present

## 2023-11-08 DIAGNOSIS — Z5181 Encounter for therapeutic drug level monitoring: Secondary | ICD-10-CM

## 2023-11-08 DIAGNOSIS — E559 Vitamin D deficiency, unspecified: Secondary | ICD-10-CM | POA: Insufficient documentation

## 2023-11-08 DIAGNOSIS — E785 Hyperlipidemia, unspecified: Secondary | ICD-10-CM

## 2023-11-08 LAB — POCT GLYCOSYLATED HEMOGLOBIN (HGB A1C): Hemoglobin A1C: 4.8 % (ref 4.0–5.6)

## 2023-11-08 MED ORDER — AMLODIPINE BESYLATE 5 MG PO TABS: 7.5000 mg | ORAL_TABLET | Freq: Every day | ORAL | 1 refills | Status: DC

## 2023-11-08 MED ORDER — DICLOFENAC SODIUM 75 MG PO TBEC
DELAYED_RELEASE_TABLET | ORAL | 2 refills | Status: DC
Start: 2023-11-08 — End: 2024-05-22

## 2023-11-08 NOTE — Assessment & Plan Note (Signed)
 Some improvement with recent RF ablation. Again reviewed risks of chronic NSAIDs (nephro, and she has h/o gastritis).  Encouraged re-try of Tylenol  Arthritis regularly, and use NSAIDs (either the diclofenac  or the dual action advil) sparingly.

## 2023-11-08 NOTE — Assessment & Plan Note (Signed)
 She is on CCB and diuretic, not ACE or ARB. Due to check urine microalbumin/Cr ratio today. Adjust meds if elevated

## 2023-11-08 NOTE — Patient Instructions (Signed)
 Keep up the good work! Consider skipping the 1/2 pill of amlodipine  on the days you donate platelets. If you continue to have any dizziness, or BP's are typically below 110, you can try backing off on the amlodipine  to just one 5 mg tablet daily and keep an eye on the blood pressure. Goal is <130/80.

## 2023-11-08 NOTE — Assessment & Plan Note (Signed)
 Last level was borderline at 30. Recheck today. Continue daily D3 supplement, adjust dose based on result

## 2023-11-08 NOTE — Assessment & Plan Note (Signed)
 BP well controlled on hydrochlorothiazide  and 7.5 mg of amlodipine .  Has some low BP's related to Spravato treatment, and dropped after donating platelets once. Overall is normal and she feels good. She prefers to continue on the 7.5 mg of amlodipine .  Discussed cutting back to 5 mg if ongoing dizzying, BP persistently <105, and on days she donates platelets.

## 2023-11-08 NOTE — Assessment & Plan Note (Signed)
 A1c is even lower, since stopping metformin . She continues on Ozempic 1mg .  Discussed that while DM is very well controlled, there is the potential to titrate up further if weight loss has plateaued, for her to address with [facility] bariatric clinic.  Discussed that with non-diabetics, Susan Gould should be titrated to higher doses for full weight loss benefit.  Encouraged to conitnue to try and eat a healthy diet, small portions, and continue regular exercise.

## 2023-11-08 NOTE — Assessment & Plan Note (Signed)
 Well controlled per 04/2023 labs. Continue atorvastatin  80mg  and OTC omega-3 fish oil, as well as lowfat, low cholesterol diet.  Plan to recheck at next visit.

## 2023-11-08 NOTE — Assessment & Plan Note (Signed)
 Doing very well on her current regimen of Auvelity, Spravato and counseling.

## 2023-11-09 ENCOUNTER — Ambulatory Visit: Payer: Self-pay | Admitting: Family Medicine

## 2023-11-09 LAB — MICROALBUMIN / CREATININE URINE RATIO: Creatinine, Urine: 25.8 mg/dL

## 2023-11-09 LAB — CBC WITH DIFFERENTIAL/PLATELET
Basophils Absolute: 0 10*3/uL (ref 0.0–0.2)
Basos: 0 %
EOS (ABSOLUTE): 0.3 10*3/uL (ref 0.0–0.4)
Eos: 5 %
Hematocrit: 33.2 % — ABNORMAL LOW (ref 34.0–46.6)
Hemoglobin: 10.2 g/dL — ABNORMAL LOW (ref 11.1–15.9)
Immature Grans (Abs): 0 10*3/uL (ref 0.0–0.1)
Immature Granulocytes: 0 %
Lymphocytes Absolute: 1.9 10*3/uL (ref 0.7–3.1)
Lymphs: 35 %
MCH: 27.1 pg (ref 26.6–33.0)
MCHC: 30.7 g/dL — ABNORMAL LOW (ref 31.5–35.7)
MCV: 88 fL (ref 79–97)
Monocytes Absolute: 0.4 10*3/uL (ref 0.1–0.9)
Monocytes: 8 %
Neutrophils Absolute: 2.8 10*3/uL (ref 1.4–7.0)
Neutrophils: 52 %
Platelets: 155 10*3/uL (ref 150–450)
RBC: 3.76 x10E6/uL — ABNORMAL LOW (ref 3.77–5.28)
RDW: 13.4 % (ref 11.7–15.4)
WBC: 5.4 10*3/uL (ref 3.4–10.8)

## 2023-11-09 LAB — COMPREHENSIVE METABOLIC PANEL WITH GFR
ALT: 34 IU/L — ABNORMAL HIGH (ref 0–32)
AST: 36 IU/L (ref 0–40)
Albumin: 4.5 g/dL (ref 3.8–4.9)
Alkaline Phosphatase: 89 IU/L (ref 44–121)
BUN/Creatinine Ratio: 29 — ABNORMAL HIGH (ref 9–23)
BUN: 24 mg/dL (ref 6–24)
Bilirubin Total: 0.2 mg/dL (ref 0.0–1.2)
CO2: 23 mmol/L (ref 20–29)
Calcium: 9.7 mg/dL (ref 8.7–10.2)
Chloride: 101 mmol/L (ref 96–106)
Creatinine, Ser: 0.83 mg/dL (ref 0.57–1.00)
Globulin, Total: 2.6 g/dL (ref 1.5–4.5)
Glucose: 82 mg/dL (ref 70–99)
Potassium: 3.9 mmol/L (ref 3.5–5.2)
Sodium: 142 mmol/L (ref 134–144)
Total Protein: 7.1 g/dL (ref 6.0–8.5)
eGFR: 81 mL/min/{1.73_m2} (ref >59)

## 2023-11-09 LAB — TSH: TSH: 0.988 u[IU]/mL (ref 0.450–4.500)

## 2023-11-09 LAB — VITAMIN D 25 HYDROXY (VIT D DEFICIENCY, FRACTURES): Vit D, 25-Hydroxy: 56.1 ng/mL (ref 30.0–100.0)

## 2023-11-09 NOTE — Progress Notes (Signed)
 Spoke with patient and advised of labs (urine microalb still pending). Hgb has dropped. She donated platelets this past weekend at [facility].  They used just 1 arm d/t her h/o breast cancer, but she reports she did get the RBCs back. She states hgb prior to donation was 12.5.  She denies other bleeding, other than occasional mild hemorrhoidal bleeding. She takes a B12 supplement. Advised to stay well hydrated (elevated BUN/Cr ratio), and that ALT is improved, minimally elevated.  Plan is to add on iron studies and B12 level.  If iron stores are low will have her take some iron and recheck in a month. If remains low, to f/u with GI.  If all normal, will still recheck CBC in a month. Pt agreeable to this plan.

## 2023-11-10 ENCOUNTER — Other Ambulatory Visit: Payer: Self-pay | Admitting: *Deleted

## 2023-11-10 DIAGNOSIS — D508 Other iron deficiency anemias: Secondary | ICD-10-CM

## 2023-11-11 LAB — SPECIMEN STATUS REPORT

## 2023-11-11 LAB — IRON,TIBC AND FERRITIN PANEL
Ferritin: 14 ng/mL — ABNORMAL LOW (ref 15–150)
Iron Saturation: 10 % — ABNORMAL LOW (ref 15–55)
Iron: 41 ug/dL (ref 27–159)
Total Iron Binding Capacity: 393 ug/dL (ref 250–450)
UIBC: 352 ug/dL (ref 131–425)

## 2023-11-11 LAB — B12 AND FOLATE PANEL
Folate: >20 ng/mL (ref >3.0)
Vitamin B-12: 1256 pg/mL — ABNORMAL HIGH (ref 232–1245)

## 2023-11-13 ENCOUNTER — Other Ambulatory Visit: Payer: Self-pay | Admitting: Hematology and Oncology

## 2023-11-17 ENCOUNTER — Encounter: Payer: BC Managed Care – PPO | Admitting: Family Medicine

## 2023-11-23 ENCOUNTER — Encounter: Payer: BC Managed Care – PPO | Admitting: Family Medicine

## 2023-12-10 ENCOUNTER — Other Ambulatory Visit (INDEPENDENT_AMBULATORY_CARE_PROVIDER_SITE_OTHER)

## 2023-12-10 ENCOUNTER — Other Ambulatory Visit: Payer: Self-pay

## 2023-12-10 DIAGNOSIS — D508 Other iron deficiency anemias: Secondary | ICD-10-CM

## 2023-12-11 ENCOUNTER — Ambulatory Visit: Payer: Self-pay | Admitting: Family Medicine

## 2023-12-11 ENCOUNTER — Encounter: Payer: Self-pay | Admitting: Family Medicine

## 2023-12-11 LAB — CBC WITH DIFFERENTIAL/PLATELET
Basophils Absolute: 0 10*3/uL (ref 0.0–0.2)
Basos: 1 %
EOS (ABSOLUTE): 0.1 10*3/uL (ref 0.0–0.4)
Eos: 3 %
Hematocrit: 34.3 % (ref 34.0–46.6)
Hemoglobin: 10.3 g/dL — ABNORMAL LOW (ref 11.1–15.9)
Immature Grans (Abs): 0 10*3/uL (ref 0.0–0.1)
Immature Granulocytes: 0 %
Lymphocytes Absolute: 1.1 10*3/uL (ref 0.7–3.1)
Lymphs: 27 %
MCH: 27.2 pg (ref 26.6–33.0)
MCHC: 30 g/dL — ABNORMAL LOW (ref 31.5–35.7)
MCV: 91 fL (ref 79–97)
Monocytes Absolute: 0.3 10*3/uL (ref 0.1–0.9)
Monocytes: 8 %
Neutrophils Absolute: 2.5 10*3/uL (ref 1.4–7.0)
Neutrophils: 61 %
Platelets: 204 10*3/uL (ref 150–450)
RBC: 3.78 x10E6/uL (ref 3.77–5.28)
RDW: 14.8 % (ref 11.7–15.4)
WBC: 4.2 10*3/uL (ref 3.4–10.8)

## 2023-12-11 LAB — FERRITIN: Ferritin: 13 ng/mL — ABNORMAL LOW (ref 15–150)

## 2023-12-17 ENCOUNTER — Other Ambulatory Visit: Payer: Self-pay | Admitting: Family Medicine

## 2023-12-17 DIAGNOSIS — I10 Essential (primary) hypertension: Secondary | ICD-10-CM

## 2023-12-29 ENCOUNTER — Other Ambulatory Visit: Payer: Self-pay | Admitting: Family Medicine

## 2023-12-29 DIAGNOSIS — I10 Essential (primary) hypertension: Secondary | ICD-10-CM

## 2024-01-15 ENCOUNTER — Other Ambulatory Visit: Payer: Self-pay | Admitting: Family Medicine

## 2024-01-15 DIAGNOSIS — E782 Mixed hyperlipidemia: Secondary | ICD-10-CM

## 2024-01-15 DIAGNOSIS — I10 Essential (primary) hypertension: Secondary | ICD-10-CM

## 2024-01-26 ENCOUNTER — Other Ambulatory Visit: Payer: Self-pay | Admitting: Family Medicine

## 2024-03-06 ENCOUNTER — Other Ambulatory Visit: Payer: Self-pay | Admitting: Hematology and Oncology

## 2024-03-06 ENCOUNTER — Ambulatory Visit: Attending: Surgery

## 2024-03-06 VITALS — Wt 184.4 lb

## 2024-03-06 DIAGNOSIS — Z483 Aftercare following surgery for neoplasm: Secondary | ICD-10-CM | POA: Insufficient documentation

## 2024-03-06 DIAGNOSIS — Z1231 Encounter for screening mammogram for malignant neoplasm of breast: Secondary | ICD-10-CM

## 2024-03-06 NOTE — Therapy (Signed)
 OUTPATIENT PHYSICAL THERAPY SOZO SCREENING NOTE   Patient Name: Susan Gould MRN: [id number] DOB:12-Jul-1965, 58 y.o., female Today's Date: 03/06/2024  PCP: Randol Dawes, MD REFERRING PROVIDER: Vernetta Berg, MD   PT End of Session - 03/06/24 0830     Visit Number 2   # unchanged due to screen only   PT Start Time 0828    PT Stop Time 0832    PT Time Calculation (min) 4 min    Activity Tolerance Patient tolerated treatment well    Behavior During Therapy [facility] for tasks assessed/performed          Past Medical History:  Diagnosis Date   Allergic rhinitis, cause unspecified    cat/dog/ragweed/mold/wool, s/p immunotherapy x 3-4 yrs   Anemia    Anxiety 2006   Asthma    (Dr. Brien)   Breast cancer [facility]) 06/2020   Colon polyps 2007   Dr. Celestia (she was told repeat in 10 years)   COVID-19 virus infection 12/2020   DDD (degenerative disc disease), lumbosacral    Depression 2006   Endometriosis    Dr. Raeanne   Family history of breast cancer in female    Fatty liver    GERD (gastroesophageal reflux disease)    Heart murmur    Hemorrhoid    internal and external   History of radiation therapy 12/06/2020   right breast 11/07/2020-12/06/2020  Dr Lynwood Nasuti   Hyperlipidemia    Hypertension    Insulin  resistance    Dr. Raeanne   Medial meniscus tear    Left. Surgery Rolan Chancy.  NOT seen on MRI 06/2013   Obesity    OSA (obstructive sleep apnea)    on CPAP (Dr. Corrie)   Past Surgical History:  Procedure Laterality Date   BREAST BIOPSY Right 04/20/2023   US  RT BREAST BX W LOC DEV 1ST LESION IMG BX SPEC US  GUIDE 04/20/2023 [facility]   BREAST LUMPECTOMY WITH SENTINEL LYMPH NODE BIOPSY Right 10/03/2020   Procedure: RIGHT BREAST LUMPECTOMY WITH SENTINEL LYMPH NODE BIOPSY;  Surgeon: Vernetta Berg, MD;  Location:  SURGERY CENTER;  Service: General;  Laterality: Right;   CARPAL TUNNEL RELEASE Right 12/28/2019   Dr. Chancy   CARPAL TUNNEL  RELEASE Left 02/22/2020   CHOLECYSTECTOMY  1986   EYE SURGERY  2000   FINGER GANGLION CYST EXCISION  2003   RIGHT 3rd finger   JOINT REPLACEMENT  2017   KNEE ARTHROSCOPY Right 04/17/2021   Dr. Velinda Chancy (torn meniscus)   KNEE SURGERY  '95 and '97   BILATERAL KNEE   LAPAROSCOPY  '98, '01   endometriosis (Dr. Raeanne)   LASIK  2000   RADIOFREQUENCY ABLATION Bilateral 12/2021   for back pain   tibial tuberoplasty  90's   left   TOTAL KNEE ARTHROPLASTY Right 03/17/2022   Procedure: RIGHT TOTAL KNEE ARTHROPLASTY;  Surgeon: Chancy Evalene BIRCH, MD;  Location: [facility];  Service: Orthopedics;  Laterality: Right;   Patient Active Problem List   Diagnosis Date Noted   Depression, recurrent (HCC) 11/08/2023   Vitamin D  insufficiency 11/08/2023   Chronic low back pain 11/08/2023   Diabetes mellitus type II, controlled (HCC) 11/14/2022   Hypertension associated with diabetes (HCC) 11/14/2022   Hyperlipidemia associated with type 2 diabetes mellitus (HCC) 11/14/2022   S/P total knee arthroplasty, right 03/17/2022   Malignant neoplasm of upper-outer quadrant of right breast in female, estrogen receptor positive (HCC) 09/09/2020   Steatohepatitis 07/25/2019   Acute exacerbation of  moderate persistent extrinsic asthma 06/02/2016   Obesity due to excess calories with serious comorbidity 04/23/2016   Pre-operative clearance 09/23/2015   Mixed hyperlipidemia 07/29/2011   OA (osteoarthritis) of knee 07/29/2011   Morbid obesity with body mass index of 45.0-49.9 in adult [facility]) 03/17/2010   Insulin  resistance syndrome 11/13/2009   HYPERLIPIDEMIA 11/13/2009   Anxiety state 11/13/2009   Essential hypertension 11/13/2009   Mild intermittent asthma 11/13/2009   GERD 11/13/2009   ENDOMETRIOSIS 11/13/2009   OSA (obstructive sleep apnea) 11/13/2009    REFERRING DIAG: right breast cancer at risk for lymphedema  THERAPY DIAG: Aftercare following surgery for neoplasm  PERTINENT HISTORY: Patient was  diagnosed on 09/02/2020 with right grade II invasive ductal carcinoma breast cancer. She underwent a right lumpectomy and sentinel node biopsy (1 negative) on 10/03/2020. It is ER/PR positive and HER2 negative with a Ki67 of 30%.   PRECAUTIONS: right UE Lymphedema risk, None  SUBJECTIVE: Pt returns for her 6 month L-Dex screen.   PAIN:  Are you having pain? No  SOZO SCREENING: Patient was assessed today using the SOZO machine to determine the lymphedema index score. This was compared to her baseline score. It was determined that she is within the recommended range when compared to her baseline and no further action is needed at this time. She will continue SOZO screenings. These are done every 3 months for 2 years post operatively followed by every 6 months for 2 years, and then annually.   L-DEX FLOWSHEETS - 03/06/24 0800       L-DEX LYMPHEDEMA SCREENING   Measurement Type Unilateral    L-DEX MEASUREMENT EXTREMITY Upper Extremity    POSITION  Standing    DOMINANT SIDE Right    At Risk Side Right    BASELINE SCORE (UNILATERAL) -1.2    L-DEX SCORE (UNILATERAL) 0    VALUE CHANGE (UNILAT) 1.2         P: Pt has one more 6 month SOZO than can transition to annual screens.   Aden Berwyn Caldron, PTA 03/06/2024, 8:33 AM

## 2024-03-29 ENCOUNTER — Encounter: Payer: Self-pay | Admitting: Family Medicine

## 2024-03-30 ENCOUNTER — Encounter: Payer: Self-pay | Admitting: *Deleted

## 2024-04-16 ENCOUNTER — Other Ambulatory Visit: Payer: Self-pay | Admitting: Family Medicine

## 2024-04-16 DIAGNOSIS — I10 Essential (primary) hypertension: Secondary | ICD-10-CM

## 2024-04-20 ENCOUNTER — Ambulatory Visit
Admission: RE | Admit: 2024-04-20 | Discharge: 2024-04-20 | Disposition: A | Discharge status: Home / Self Care | Source: Ambulatory Visit | Attending: Hematology and Oncology | Admitting: Hematology and Oncology

## 2024-04-20 DIAGNOSIS — Z1231 Encounter for screening mammogram for malignant neoplasm of breast: Secondary | ICD-10-CM

## 2024-04-20 HISTORY — DX: Personal history of irradiation: Z92.3

## 2024-04-28 ENCOUNTER — Other Ambulatory Visit: Payer: Self-pay | Admitting: Family Medicine

## 2024-05-03 ENCOUNTER — Other Ambulatory Visit: Payer: Self-pay | Admitting: Family Medicine

## 2024-05-03 DIAGNOSIS — E782 Mixed hyperlipidemia: Secondary | ICD-10-CM

## 2024-05-03 NOTE — Progress Notes (Signed)
 HPI female never smoker followed for OSA, complicated by obesity, GERD, HBP Followed by Dr. Geronimo for asthma NPSG 04/23/06- AHI 42/ hr, desat to 89% ---------------------------------------------------------------------------   05/04/23-  58 year old female never smoker followed for OSA, Asthma, complicated by Obesity, GERD, HBP, Asthma, Hyperlipidemia, DM1, Depression, R Breast CA,  Endometriosis, Covid infection [id number],  -Arnuity, CPAP auto 5-11/ [employer]-AirSense 11 AutoSet   Download compliance-100%, AHI 0.4/hr Body weight today- 195 lbs ACT score-24 Continues working on weight loss. Had flu vax. Can't sleep w/o CPAP. Download reviewed. Nasal pillows/ chin strap. Not routinely using Arnuity- doesn't feel need.   111/6/25-   58 year old female never smoker followed for OSA, Asthma, complicated by Obesity, GERD, HBP, Hyperlipidemia, DM1, Depression, R Breast CA,  Endometriosis, Covid infection [id number],  -Arnuity, CPAP auto 5-11/ [employer]-AirSense 11 AutoSet   Download compliance- 100%, AHI 0.4/hr Body weight today- 187 lbs                           >yesterday was birthday< Nasal dryness> humidifier adjustment, saline gel. Discussed the use of AI scribe software for clinical note transcription with the patient, who gave verbal consent to proceed.  History of Present Illness   Susan Gould is a 58 year old female with sleep apnea who presents for follow-up regarding CPAP use and nasal symptoms.  She is comfortable with her CPAP machine, set to a pressure range of five to eleven centimeters of water , typically around ten. She experiences less than one breakthrough apnea per hour. A nasal mask and chin strap effectively prevent her jaw from falling open during the night.  She experiences nasal symptoms, including epistaxis and solid nasal discharge in the mornings, attributed to dryness. She has resumed using her nasal rinse. Nasal passages tighten with mouth breathing but remain  clear with nasal breathing.  Her allergies have worsened since August, coinciding with ragweed season, causing nasal congestion and dryness. She uses a rescue inhaler when exposed to irritants like chemical cleaners and dust, which cause a sensation of tightness. Arnuity use has increased over the past year. No coughing or wheezing.     Assessment and Plan:    Obstructive sleep apnea- benefits from CPAP Well-controlled with CPAP therapy. Current settings effective with <1 apnea/hour. Discussed alternative mask options and potential jaw issues with mouthpiece. - Sent order to home care company for alternative mask trials. - Consider chin strap if alternative masks are not preferred.  Mild intermittent asthma Symptoms exacerbated by irritants. Increased use of Arnuity and rescue inhaler. - Sent refill for Arnuity to [facility] on [address].  Chronic rhinitis with nasal dryness and epistaxis Exacerbated by indoor heating and low humidity. Advised increased CPAP humidification and discussed saline nasal gel. - Increase CPAP humidification. - Provided sample of saline nasal gel for nasal moisturization.     ROS-see HPI   + = positive Constitutional:    +weight loss, night sweats, fevers, chills, fatigue, lassitude. HEENT:    headaches, difficulty swallowing, tooth/dental problems, sore throat,       sneezing, itching, ear ache, nasal congestion, post nasal drip, snoring CV:    chest pain, orthopnea, PND, swelling in lower extremities, anasarca,  dizziness, palpitations Resp:   shortness of breath with exertion or at rest.                productive cough,   non-productive cough, coughing up of blood.              change in color of mucus.  wheezing.   Skin:    rash or lesions. GI:  No-   heartburn, indigestion, abdominal pain, nausea, vomiting, diarrhea,                 change in bowel habits, loss of appetite GU: dysuria, change in color  of urine, no urgency or frequency.   flank pain. MS:   joint pain, stiffness, decreased range of motion, back pain. Neuro-     nothing unusual Psych:  change in mood or affect.  depression or anxiety.   memory loss.  OBJ- Physical Exam General- Alert, Oriented, Affect-appropriate, Distress- none acute, + overweight Skin- rash-none, lesions- none, excoriation- none Lymphadenopathy- none Head- atraumatic            Eyes- Gross vision intact, PERRLA, conjunctivae and secretions clear            Ears- Hearing, canals-normal            Nose- Clear, no-Septal dev, mucus, polyps, erosion, perforation             Throat- Mallampati III-IV , mucosa clear , drainage- none, tonsils- atrophic Neck- flexible , trachea midline, no stridor , thyroid  nl, carotid no bruit Chest - symmetrical excursion , unlabored           Heart/CV- RRR , no murmur , no gallop  , no rub, nl s1 s2                           - JVD- none , edema- none, stasis changes- none, varices- none           Lung- clear to P&A, wheeze- none, cough- none , dullness-none, rub- none           Chest wall-  Abd-  Br/ Gen/ Rectal- Not done, not indicated Extrem- cyanosis- none, clubbing, none, atrophy- none, strength- nl Neuro- grossly intact to observation

## 2024-05-04 ENCOUNTER — Encounter: Payer: Self-pay | Admitting: Internal Medicine

## 2024-05-04 ENCOUNTER — Ambulatory Visit: Payer: BC Managed Care – PPO | Admitting: Internal Medicine

## 2024-05-04 VITALS — BP 128/79 | HR 74 | Temp 98.4°F | Ht 64.0 in | Wt 187.0 lb

## 2024-05-04 DIAGNOSIS — R040 Epistaxis: Secondary | ICD-10-CM | POA: Diagnosis not present

## 2024-05-04 DIAGNOSIS — J310 Chronic rhinitis: Secondary | ICD-10-CM | POA: Diagnosis not present

## 2024-05-04 DIAGNOSIS — J4520 Mild intermittent asthma, uncomplicated: Secondary | ICD-10-CM

## 2024-05-04 DIAGNOSIS — G4733 Obstructive sleep apnea (adult) (pediatric): Secondary | ICD-10-CM | POA: Diagnosis not present

## 2024-05-04 MED ORDER — ARNUITY ELLIPTA 100 MCG/ACT IN AEPB
1.0000 | INHALATION_SPRAY | Freq: Every day | RESPIRATORY_TRACT | 12 refills | Status: AC
Start: 2024-05-04 — End: ?

## 2024-05-04 NOTE — Patient Instructions (Signed)
 Order- sample saline nasal gel   Order- DME [employer]- please help refit CPAP mask of choice- see if she likes full-face or hybrid style. Continue auto 5-11, supplies, humidifier, AirView

## 2024-05-10 NOTE — Progress Notes (Unsigned)
 Chief Complaint  Patient presents with   Diabetes    Fasting med check. Patient has not seen Dr. Burnette since May, is scheduled for 12/4 and would like us  to do iron studies with labs today. Has been having left eyelid twitching x 1 month.    Patient presents for 6 month follow-up on chronic problems.  She has noticed her L eyelid twitching for a month. She has had this in the past, resolved on its own, but hasn't yet.  No other body parts twitching.  Hypertension:  She continues on 7.5 mg of amlodipine  (tapered down due to low BP's), losartan  50 mg and hydrochlorothiazide  12.5 mg daily. BP's are running 100-110/59-70.  She notes lower BP's when she gets Spravato, but not symptomatic. She previously reported noting a little dizziness with standing up (mainly on Sundays). Today she repots the dizziness has improved since starting iron.  No further syncopal episodes (occurred with platelet donation and drop in BP previously). She donated platelets 11/8 (and was told her total cholesterol was 124). She reports her Hgb was normal when donating as well. She denies headaches, chest pain, shortness of breath, edema.  BP Readings from Last 3 Encounters:  05/11/24 104/62  05/04/24 128/79  11/08/23 110/60    Mixed hyperlipidemia--She continues on atorvastatin  80mg  daily and denies side effects.  She is taking OTC omega-3 fish oil 2000mg  BID (Vascepa  and Lovaza  weren't covered by her insurance).  She tries to follow a lowfat diet. Portions are smaller due to the ozempic. Lipids were at goal on last check, due for recheck. Lab Results  Component Value Date   CHOL 124 05/19/2023   HDL 42 05/19/2023   LDLCALC 60 05/19/2023   TRIG 120 05/19/2023   CHOLHDL 3.0 05/19/2023     Diabetes: She had prediabetes for many years, A1c went up to 6.5% in 10/2021, and up to 6.6% when checked shortly after by the bariatric clinic.   Last A1c was 4.8 when on Ozempic 1 mg (metformin  had been stopped already at  that time). She denies hypoglycemic symptoms.   Obesity:  She continues to go to [facility] bariatric clinic.  She finished the [employer] financial, and is now on maintenance.  She continues on Ozempic 1 mg weekly. Had visit a few weeks ago. Eating one regular meal and three to four protein shakes (pure protein) daily, sometimes has a second meal. Uses [employer] protein bars also.  Meals are frozen dinners--Healthy Choice, Lean Cuisine, sometimes add vegetables. She continues to drink  64 oz of water  daily. She has constipation d/t ozempic, which is very well controlled with daily Miralax .  Wt Readings from Last 3 Encounters:  05/11/24 187 lb 12.8 oz (85.2 kg)  05/04/24 187 lb (84.8 kg)  03/06/24 184 lb 6 oz (83.6 kg)  Weight 250# 3.2 oz in 04/2021. (Pt states max was 262#)   She is under the care of pulmonary for asthma and OSA, seen recently. She is doing well on CPAP. Doing well on Arnuity, needing it and her rescue inhaler more often, related to flare of allergies. She remains congested with her allergies.  She switched from Flonase  to Nasacort , which seems to have helped.   Breast cancer--Doing well on anastrozole . Tolerating side effects.  Last mammogram was in 03/2024.  Denies any breast concerns today.   Chronic back pain: She remains under the care of Dr. Ibazebo. She had bilateral RF ablation in July 2023, 09/2022, and again earlier this year, at 3 levels (after  MRI 06/2023). She reports today that she had the 3 level, bilateral ablation again 3 weeks ago (03/2024).  She doesn't notice as much benefit as with the other ablations. She previously has had injections for trochanteric bursitis as well. She is starting to have some recurrent pain on the left.  She continues to use Tylenol  arthritis in the morning, and diclofenac  with dinner (we previously encouraged her to use Tylenol  Arthritis regularly, and diclofenac  just prn, but reports it didn't work for her). When she has a flare, she takes 2 Dual  Action Ibuprofen in place of the diclofenac , which helps.  She continues to take gabapentin  300 mg BID. Diclofenac  was last filled #60 in 10/2023 with 2 refills.  She is also seeing Dr. Beverley for bilateral shoulder impingement. She had some benefit from injections, but this is  wearing off. She has had PT, and is scheduled for bilateral shoulder MRIs. She has no pain during the day. Pain occurs at night, in bed.  Epigastric pain, gastritis, chronic constipation.  Under the care of Dr. Burnette.  EGD in 01/2023. She is doing well on once daily omeprazole 20mg . She still has some carafate at home, to use prn, and has needed it a couple of times for flares, which worked well.  She denies any dysphagia, indigestion, abdominal pain. Bowels are well controlled with Miralax  1/2 cap daily. Next colonoscopy is due in 2029.  She was noted to have new onset of iron deficiency anemia in May 2025, persisted in June. Labs were sent to Dr. Burnette and she was to f/u to discuss with him. She reports she communicated with him through the portal. He recommended taking iron. She sees him for follow-up on 12/2. Hasn't had any labs done. She gets some bloody noses, no other bleeding. Stools are softer, no hemorrhoidal bleeding. She tolerates the iron better since separating from the calcium . She is taking 65 mg (325) of ferrous sulfate once daily. She is less dizzy since on the iron. Iron is checked with platelet donations, and has been upper 12-14.1 range.  Lab Results  Component Value Date   WBC 4.2 12/10/2023   HGB 10.3 (L) 12/10/2023   HCT 34.3 12/10/2023   MCV 91 12/10/2023   PLT 204 12/10/2023   Lab Results  Component Value Date   IRON 41 11/08/2023   TIBC 393 11/08/2023   FERRITIN 13 (L) 12/10/2023    Depression: She continues to see psychiatrist (Dr. Vincente) and counselor. She sees Blimy Napoleon (virtually). She is now taking Spravato 2x/week (increased frequency prior to traveling for 2 weeks at  Christmas) and Auvelity 2x/day. She reports doing very well, and denies side effects.  Moods have been well controlled on this regimen.     PMH, PSH, SH reviewed  Outpatient Encounter Medications as of 05/11/2024  Medication Sig Note   acetaminophen  (TYLENOL ) 650 MG CR tablet Take 1,300 mg by mouth every 8 (eight) hours as needed for pain. 05/11/2024: Takes 2 every am   albuterol  (VENTOLIN  HFA) 108 (90 Base) MCG/ACT inhaler Inhale 2 puffs into the lungs every 6 (six) hours as needed for wheezing or shortness of breath. 05/11/2024: As needed, rarely   ALPRAZolam  (XANAX ) 0.5 MG tablet TAKE 1/2 -1 TABLET BY MOUTH 3 TIMES DAILY AS NEEDED FOR ANXIETY 05/11/2024: As needed   anastrozole  (ARIMIDEX ) 1 MG tablet Take 1 tablet by mouth daily.    atorvastatin  (LIPITOR) 80 MG tablet TAKE 1 TABLET BY MOUTH DAILY    AUVELITY 45-105  MG TBCR Take 1 tablet by mouth 2 (two) times daily.    CALCIUM  PO Take 1,000 mg by mouth daily.    cetirizine (ZYRTEC) 10 MG tablet Take 10 mg by mouth daily.    cholecalciferol (VITAMIN D3) 25 MCG (1000 UNIT) tablet Take 1,000 Units by mouth daily.    diclofenac  (VOLTAREN ) 75 MG EC tablet TAKE 1 TABLET BY MOUTH 2 TIMES A DAY WITH FOOD FOR MILD OR MODERATE PAIN    Esketamine HCl, 56 MG Dose, (SPRAVATO, 56 MG DOSE,) 28 MG/DEVICE SOPK Place into the nose. 05/11/2024: Unsure of dose.  Getting 6 treatments/month, alternating once and twice weekly     Fluticasone  Furoate (ARNUITY ELLIPTA ) 100 MCG/ACT AEPB Inhale 1 puff into the lungs daily.    gabapentin  (NEURONTIN ) 300 MG capsule TAKE 1 CAPSULE BY MOUTH 2 TIMES A DAY    hydrochlorothiazide  (MICROZIDE ) 12.5 MG capsule TAKE 1 CAPSULE BY MOUTH DAILY    L-Methylfolate-Algae (DEPLIN 15 PO) Take 1 capsule by mouth daily.    losartan  (COZAAR ) 50 MG tablet TAKE 1 TABLET BY MOUTH DAILY    Multiple Vitamins-Minerals (WOMENS 50+ MULTI VITAMIN PO) Take 1 tablet by mouth daily.    Omega-3 Fatty Acids (FISH OIL) 1000 MG CAPS Take 2,000 mg by  mouth in the morning and at bedtime.    omeprazole (PRILOSEC) 20 MG capsule Take 20 mg by mouth daily.    ondansetron  (ZOFRAN -ODT) 4 MG disintegrating tablet Take 1 tablet (4 mg total) by mouth 2 (two) times daily as needed for nausea or vomiting. 05/11/2024: As needed   OZEMPIC, 1 MG/DOSE, 4 MG/3ML SOPN Inject 1 mg into the skin once a week. 11/08/2023: Takes on Thursday   polyethylene glycol (MIRALAX ) 17 g packet Take 17 g by mouth daily. to prevent constipation 11/08/2023: daily   triamcinolone  (NASACORT  ALLERGY 24HR) 55 MCG/ACT AERO nasal inhaler Place 2 sprays into the nose daily.    vitamin B-12 (CYANOCOBALAMIN) 1000 MCG tablet Take 1,000 mcg by mouth daily.    vitamin C (ASCORBIC ACID) 500 MG tablet Take 500 mg by mouth daily.    amLODipine  (NORVASC ) 5 MG tablet Take 1.5 tablets (7.5 mg total) by mouth daily.    fluticasone  (FLONASE ) 50 MCG/ACT nasal spray INSTILL 1 TO 2 SPRAYS INTO EACH NOSTRIL ONCE A DAY (Patient not taking: Reported on 05/11/2024)    [DISCONTINUED] amLODipine  (NORVASC ) 5 MG tablet Take 1.5 tablets (7.5 mg total) by mouth daily.    No facility-administered encounter medications on file as of 05/11/2024.   Allergies  Allergen Reactions   Mobic [Meloxicam] Itching and Rash     ROS: no fever, chills, URI symptoms.  Breathing is at baseline, but having nasal congestion from allergies. No chest pain, palpitations, edema. Dizziness improving with iron, no further syncope. No urinary issues. No bleeding, bruising, rash (just some nosebleeds). Denies knee pain. (Sometimes with weather changes). Hip pain/bursitis is flaring on the left. LBP is somewhat improved with recent ablation Moods are very good. No further reflux or epigastric pain. (Rare flares). Constipation is controlled with miralax  Weight is stable.    PHYSICAL EXAM:  BP 104/62   Pulse 72   Ht 5' 4 (1.626 m)   Wt 187 lb 12.8 oz (85.2 kg)   LMP 01/27/2009 (Exact Date)   BMI 32.24 kg/m   Wt  Readings from Last 3 Encounters:  05/11/24 187 lb 12.8 oz (85.2 kg)  05/04/24 187 lb (84.8 kg)  03/06/24 184 lb 6 oz (83.6 kg)  Pleasant female in no distress HEENT: conjunctiva and sclera are clear, EOMI.  OP clear Neck: no lymphadenopathy, thyromegaly or mass, no bruit Heart: regular rate and rhythm, no murmur Lungs: clear, no wheezing Back: no CVA tenderness. No spinal tenderness, muscle spasm. Area of discomfort is near SI joints, nontender. Abdomen: soft, obese, nontender Extremities: no edema, normal pulses. Skin: normal turgor, no visible rash Neuro: alert and oriented, normal gait Psych:  Normal eye contact, speech, hygiene and grooming  Diabetic foot exam--normal monofilament sensation Noted to have scabbed area at the medial L 2nd toe, related to recent ingrown nail. There is no erythema, warmth or tenderness. No drainage or crusting.  Lab Results  Component Value Date   HGBA1C 5.1 05/11/2024     ASSESSMENT/PLAN:  Controlled type 2 diabetes mellitus with complication, without long-term current use of insulin  (HCC) - Diabetes is well controlled.  On Ozempic. She is under care of bariatric clinic. Briefly discussed poss titration of dose to help with further wt loss. - Plan: HgB A1c  Hypertension associated with diabetes (HCC) - well controlled - Plan: Comprehensive metabolic panel  Hyperlipidemia associated with type 2 diabetes mellitus (HCC) - continue statin and low cholesterol diet  Essential hypertension - well controlled, continue current regimen - Plan: amLODipine  (NORVASC ) 5 MG tablet, Comprehensive metabolic panel  Mixed hyperlipidemia - continue statin and low cholesterol diet - Plan: Lipid Panel  Other iron deficiency anemia - Hgb improved per checks at platelet donation. Repeat labs and forward to Dr. Burnette - Plan: CBC with Differential, Iron, TIBC and Ferritin Panel  Depression, recurrent - doing well on current regimen per psych, continue  Medication  monitoring encounter - Plan: Lipid Panel, Comprehensive metabolic panel, CBC with Differential, Iron, TIBC and Ferritin Panel  Long-term (current) use of injectable non-insulin  antidiabetic drugs  Class 1 obesity due to excess calories with serious comorbidity and body mass index (BMI) of 32.0 to 32.9 in adult - has lost 80#, plateauing. Under care of [facility]. Discussed potential to increase Ozempic (not needed for DM, might help with further wt loss)  Will send labs to Dr. Burnette  F/u 6 months for fasting med check, sooner prn.

## 2024-05-11 ENCOUNTER — Ambulatory Visit: Payer: Self-pay | Admitting: Family Medicine

## 2024-05-11 ENCOUNTER — Encounter: Payer: Self-pay | Admitting: Family Medicine

## 2024-05-11 VITALS — BP 104/62 | HR 72 | Ht 64.0 in | Wt 187.8 lb

## 2024-05-11 DIAGNOSIS — Z5181 Encounter for therapeutic drug level monitoring: Secondary | ICD-10-CM

## 2024-05-11 DIAGNOSIS — E1159 Type 2 diabetes mellitus with other circulatory complications: Secondary | ICD-10-CM

## 2024-05-11 DIAGNOSIS — E1169 Type 2 diabetes mellitus with other specified complication: Secondary | ICD-10-CM | POA: Diagnosis not present

## 2024-05-11 DIAGNOSIS — E118 Type 2 diabetes mellitus with unspecified complications: Secondary | ICD-10-CM

## 2024-05-11 DIAGNOSIS — E6609 Other obesity due to excess calories: Secondary | ICD-10-CM

## 2024-05-11 DIAGNOSIS — D508 Other iron deficiency anemias: Secondary | ICD-10-CM

## 2024-05-11 DIAGNOSIS — E785 Hyperlipidemia, unspecified: Secondary | ICD-10-CM

## 2024-05-11 DIAGNOSIS — E782 Mixed hyperlipidemia: Secondary | ICD-10-CM

## 2024-05-11 DIAGNOSIS — Z7985 Long-term (current) use of injectable non-insulin antidiabetic drugs: Secondary | ICD-10-CM

## 2024-05-11 DIAGNOSIS — I152 Hypertension secondary to endocrine disorders: Secondary | ICD-10-CM

## 2024-05-11 DIAGNOSIS — E66811 Obesity, class 1: Secondary | ICD-10-CM

## 2024-05-11 DIAGNOSIS — Z6832 Body mass index (BMI) 32.0-32.9, adult: Secondary | ICD-10-CM

## 2024-05-11 DIAGNOSIS — I10 Essential (primary) hypertension: Secondary | ICD-10-CM

## 2024-05-11 DIAGNOSIS — F339 Major depressive disorder, recurrent, unspecified: Secondary | ICD-10-CM

## 2024-05-11 LAB — POCT GLYCOSYLATED HEMOGLOBIN (HGB A1C): Hemoglobin A1C: 5.1 % (ref 4.0–5.6)

## 2024-05-11 LAB — CBC WITH DIFFERENTIAL/PLATELET

## 2024-05-11 MED ORDER — AMLODIPINE BESYLATE 5 MG PO TABS: 7.5000 mg | ORAL_TABLET | Freq: Every day | ORAL | 1 refills | Status: AC

## 2024-05-11 NOTE — Patient Instructions (Signed)
 No changes made today. We will contact you with your lab results and forward them to Dr. Burnette.

## 2024-05-12 ENCOUNTER — Ambulatory Visit: Payer: Self-pay | Admitting: Family Medicine

## 2024-05-12 LAB — CBC WITH DIFFERENTIAL/PLATELET
Basos: 1 %
EOS (ABSOLUTE): 0 x10E3/uL (ref 0.0–0.2)
Eos: 2 %
Hematocrit: 39.2 % (ref 34.0–46.6)
Hemoglobin: 12.8 g/dL (ref 11.1–15.9)
Immature Granulocytes: 0 %
Immature Granulocytes: 0 x10E3/uL (ref 0.0–0.1)
Lymphs: 23 %
MCH: 31.5 pg (ref 26.6–33.0)
MCHC: 32.7 g/dL (ref 31.5–35.7)
MCV: 97 fL (ref 79–97)
Monocytes Absolute: 0.1 x10E3/uL (ref 0.0–0.4)
Monocytes Absolute: 0.5 x10E3/uL (ref 0.1–0.9)
Monocytes: 7 %
Neutrophils Absolute: 1.4 x10E3/uL (ref 0.7–3.1)
Neutrophils Absolute: 4.2 x10E3/uL (ref 1.4–7.0)
Neutrophils: 67 %
Platelets: 208 x10E3/uL (ref 150–450)
RBC: 4.06 x10E6/uL (ref 3.77–5.28)
RDW: 13.6 % (ref 11.7–15.4)
WBC: 6.2 x10E3/uL (ref 3.4–10.8)

## 2024-05-12 LAB — IRON,TIBC AND FERRITIN PANEL
Ferritin: 28 ng/mL (ref 15–150)
Iron Saturation: 28 % (ref 15–55)
Iron: 100 ug/dL (ref 27–159)
Total Iron Binding Capacity: 356 ug/dL (ref 250–450)
UIBC: 256 ug/dL (ref 131–425)

## 2024-05-12 LAB — COMPREHENSIVE METABOLIC PANEL WITH GFR
ALT: 73 IU/L — ABNORMAL HIGH (ref 0–32)
AST: 47 IU/L — ABNORMAL HIGH (ref 0–40)
Albumin: 4.5 g/dL (ref 3.8–4.9)
Alkaline Phosphatase: 79 IU/L (ref 49–135)
BUN/Creatinine Ratio: 24 (ref 12–28)
BUN: 21 mg/dL (ref 8–27)
Bilirubin Total: 0.3 mg/dL (ref 0.0–1.2)
CO2: 26 mmol/L (ref 20–29)
Calcium: 9.9 mg/dL (ref 8.7–10.3)
Chloride: 102 mmol/L (ref 96–106)
Creatinine, Ser: 0.86 mg/dL (ref 0.57–1.00)
Globulin, Total: 2.5 g/dL (ref 1.5–4.5)
Glucose: 85 mg/dL (ref 70–99)
Potassium: 3.8 mmol/L (ref 3.5–5.2)
Sodium: 143 mmol/L (ref 134–144)
Total Protein: 7 g/dL (ref 6.0–8.5)
eGFR: 77 mL/min/1.73 (ref >59)

## 2024-05-12 LAB — LIPID PANEL
Chol/HDL Ratio: 2.6 ratio (ref 0.0–4.4)
Cholesterol, Total: 121 mg/dL (ref 100–199)
HDL: 46 mg/dL (ref >39)
LDL Chol Calc (NIH): 57 mg/dL (ref 0–99)
Triglycerides: 97 mg/dL (ref 0–149)
VLDL Cholesterol Cal: 18 mg/dL (ref 5–40)

## 2024-05-21 ENCOUNTER — Other Ambulatory Visit: Payer: Self-pay | Admitting: Family Medicine

## 2024-05-21 DIAGNOSIS — M545 Low back pain, unspecified: Secondary | ICD-10-CM

## 2024-05-22 NOTE — Telephone Encounter (Signed)
 Is this okay to refill?

## 2024-06-01 ENCOUNTER — Other Ambulatory Visit: Payer: Self-pay | Admitting: Gastroenterology

## 2024-06-01 DIAGNOSIS — K7581 Nonalcoholic steatohepatitis (NASH): Secondary | ICD-10-CM

## 2024-06-13 ENCOUNTER — Inpatient Hospital Stay: Admission: RE | Admit: 2024-06-13 | Discharge: 2024-06-13 | Attending: Gastroenterology | Admitting: Gastroenterology

## 2024-06-13 DIAGNOSIS — K7581 Nonalcoholic steatohepatitis (NASH): Secondary | ICD-10-CM

## 2024-07-02 ENCOUNTER — Other Ambulatory Visit: Payer: Self-pay | Admitting: Family Medicine

## 2024-07-02 DIAGNOSIS — I10 Essential (primary) hypertension: Secondary | ICD-10-CM

## 2024-07-13 ENCOUNTER — Inpatient Hospital Stay: Payer: BC Managed Care – PPO | Attending: Hematology and Oncology | Admitting: Hematology and Oncology

## 2024-07-13 VITALS — BP 120/63 | HR 84 | Temp 97.8°F | Resp 16 | Wt 185.0 lb

## 2024-07-13 DIAGNOSIS — Z1721 Progesterone receptor positive status: Secondary | ICD-10-CM | POA: Insufficient documentation

## 2024-07-13 DIAGNOSIS — Z79811 Long term (current) use of aromatase inhibitors: Secondary | ICD-10-CM | POA: Diagnosis not present

## 2024-07-13 DIAGNOSIS — N951 Menopausal and female climacteric states: Secondary | ICD-10-CM | POA: Diagnosis not present

## 2024-07-13 DIAGNOSIS — Z170 Estrogen receptor positive status [ER+]: Secondary | ICD-10-CM | POA: Diagnosis not present

## 2024-07-13 DIAGNOSIS — Z79899 Other long term (current) drug therapy: Secondary | ICD-10-CM | POA: Insufficient documentation

## 2024-07-13 DIAGNOSIS — Z803 Family history of malignant neoplasm of breast: Secondary | ICD-10-CM | POA: Diagnosis not present

## 2024-07-13 DIAGNOSIS — C50411 Malignant neoplasm of upper-outer quadrant of right female breast: Secondary | ICD-10-CM | POA: Insufficient documentation

## 2024-07-13 DIAGNOSIS — Z1732 Human epidermal growth factor receptor 2 negative status: Secondary | ICD-10-CM | POA: Insufficient documentation

## 2024-07-13 DIAGNOSIS — E11A Type 2 diabetes mellitus without complications in remission: Secondary | ICD-10-CM | POA: Insufficient documentation

## 2024-07-13 DIAGNOSIS — Z923 Personal history of irradiation: Secondary | ICD-10-CM | POA: Diagnosis not present

## 2024-07-13 DIAGNOSIS — Z800 Family history of malignant neoplasm of digestive organs: Secondary | ICD-10-CM | POA: Diagnosis not present

## 2024-07-13 NOTE — Progress Notes (Signed)
 " [facility]  [phone] [phone]) [phone]     ID: [name] DOB: 1965-08-04  MR#: [id number]  RDW#:[id number]  Patient Care Team: Randol Dawes, MD as PCP - General (Family Medicine) Vernetta Berg, MD as Consulting Physician (General Surgery) Shannon Agent, MD as Consulting Physician (Radiation Oncology) Margeret Kotyk, MD as Consulting Physician (Physical Medicine and Rehabilitation) Henry Slough, MD as Consulting Physician (Obstetrics and Gynecology) Burnette Fallow, MD as Consulting Physician (Gastroenterology) Neysa Reggy BIRCH, MD as Consulting Physician (Pulmonary Disease) Maree Lonni Inks, MD as Consulting Physician (Ophthalmology) Loretha Ash, MD as Consulting Physician (Hematology and Oncology) Ash Loretha, MD OTHER MD:  CHIEF COMPLAINT: estrogen receptor positive breast cancer  CURRENT TREATMENT: anastrozole   INTERVAL HISTORY:  Discussed the use of AI scribe software for clinical note transcription with the patient, who gave verbal consent to proceed. 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  She is more than halfway through her planned five-year course of daily anastrozole , with anticipated completion in July 2027. Surveillance mammogram in October 2025, following prior fat necrosis and biopsy, was unremarkable. Annual imaging is scheduled for October 2026. Breast size has decreased, and the patient reports that her breasts are smaller, which she attributes to her weight loss.  She continues to experience menopausal symptoms, specifically hot flashes occurring throughout the day, predominantly affecting her back with a sensation of heat radiating off her back, described as similar to sitting on a heating pad. No edema is present.  She has had significant weight loss due  to ongoing use of Ozempic, resulting in remission of type 2 diabetes. She maintains regular weight-bearing exercise and takes vitamin D  and calcium  supplements to mitigate bone density loss associated with anastrozole .  Rest of the pertinent 10 point ROS reviewed and negative.     COVID 19 VACCINATION STATUS: Pfizer x4, most recently 09/2020; infection 12/2020   HISTORY OF CURRENT ILLNESS: From the original intake note:  Susan Gould herself palpated a right breast abnormality. Of note, screening mammogram on 05/31/2020 was negative. She underwent right diagnostic mammography with tomography and right breast ultrasonography at [facility] on 08/29/2020 showing: breast density category B; palpable indeterminate 0.6 cm mass in right breast at 12 o'clock retroareolar region; right axilla negative for adenopathy.  Accordingly on 09/02/2020 she proceeded to biopsy of the right breast area in question. The pathology from this procedure ([id number]) showed: invasive ductal carcinoma, grade 2; ductal papilloma. Prognostic indicators significant for: estrogen receptor, >95% positive with strong staining intensity and progesterone receptor, 15% positive with moderate staining intensity. Proliferation marker Ki67 at 30%. HER2 equivocal by immunohistochemistry (2+), but negative by fluorescent in situ hybridization with a signals ratio 0.92 and number per cell 1.7.   Cancer Staging  Malignant neoplasm of upper-outer quadrant of right breast in female, estrogen receptor positive (HCC) Staging form: Breast, AJCC 8th Edition - Clinical stage from 09/11/2020: Stage IA (cT1b, cN0, cM0, G2, ER+, PR+, HER2-) - Signed by Layla Sandria BROCKS, MD on 09/11/2020 Stage prefix: Initial diagnosis Histologic grading system: 3 grade system Laterality: Right Staged by: Pathologist and managing physician Stage used in treatment planning: Yes National guidelines used in treatment planning: Yes Type of national guideline  used in treatment planning: NCCN  The patient's subsequent history is as detailed below.   PAST MEDICAL HISTORY: Past Medical History:  Diagnosis Date   Allergic rhinitis,  cause unspecified    cat/dog/ragweed/mold/wool, s/p immunotherapy x 3-4 yrs   Anemia    Anxiety 2006   Asthma    (Dr. Brien)   Breast cancer [facility]) 06/2020   Colon polyps 2007   Dr. Celestia (she was told repeat in 10 years)   COVID-19 virus infection 12/2020   DDD (degenerative disc disease), lumbosacral    Depression 2006   Endometriosis    Dr. Raeanne   Family history of breast cancer in female    Fatty liver    GERD (gastroesophageal reflux disease)    Heart murmur    Hemorrhoid    internal and external   History of radiation therapy 12/06/2020   right breast 11/07/2020-12/06/2020  Dr Lynwood Nasuti   Hyperlipidemia    Hypertension    Insulin  resistance    Dr. Raeanne   Medial meniscus tear    Left. Surgery Rolan Chancy.  NOT seen on MRI 06/2013   Obesity    OSA (obstructive sleep apnea)    on CPAP (Dr. Corrie)   Personal history of radiation therapy     PAST SURGICAL HISTORY: Past Surgical History:  Procedure Laterality Date   BREAST BIOPSY Right 04/20/2023   US  RT BREAST BX W LOC DEV 1ST LESION IMG BX SPEC US  GUIDE 04/20/2023 [facility] MAMMOGRAPHY   BREAST LUMPECTOMY Right 2022   BREAST LUMPECTOMY WITH SENTINEL LYMPH NODE BIOPSY Right 10/03/2020   Procedure: RIGHT BREAST LUMPECTOMY WITH SENTINEL LYMPH NODE BIOPSY;  Surgeon: Vernetta Berg, MD;  Location: [facility];  Service: General;  Laterality: Right;   CARPAL TUNNEL RELEASE Right 12/28/2019   Dr. Chancy   CARPAL TUNNEL RELEASE Left 02/22/2020   CHOLECYSTECTOMY  1986   EYE SURGERY  2000   FINGER GANGLION CYST EXCISION  2003   RIGHT 3rd finger   JOINT REPLACEMENT  2017   KNEE ARTHROSCOPY Right 04/17/2021   Dr. Velinda Chancy (torn meniscus)   KNEE SURGERY  '95 and '97   BILATERAL KNEE   LAPAROSCOPY  '98, '01   endometriosis  (Dr. Raeanne)   LASIK  2000   RADIOFREQUENCY ABLATION Bilateral 12/2021   for back pain   tibial tuberoplasty  90's   left   TOTAL KNEE ARTHROPLASTY Right 03/17/2022   Procedure: RIGHT TOTAL KNEE ARTHROPLASTY;  Surgeon: Chancy Evalene BIRCH, MD;  Location: [facility];  Service: Orthopedics;  Laterality: Right;    FAMILY HISTORY: Family History  Problem Relation Age of Onset   Diabetes Mother    Hypertension Mother    Hyperlipidemia Mother    Arthritis Mother    Cancer Mother 30       breast cancer   Breast cancer Mother 14   Diabetes Father    Hyperlipidemia Father    Hypertension Father    Asthma Father    Arthritis Father    Heart disease Father        CHF, hypokinesis   COPD Father        due to asbestos exposure; former smoker   Pulmonary embolism Father    Parkinson's disease Father    Hypertension Sister    Colon cancer Maternal Grandmother        late 60's   Cancer Maternal Grandmother        colon   Heart disease Maternal Grandmother        MI in 68's   Breast cancer Paternal Aunt        30's   Cancer Paternal Aunt  breast   Heart disease Paternal Uncle    Diabetes Paternal Uncle    Kidney disease Paternal Uncle   Her father died at age 35 from COPD and Parkinson's. Her mother died at age 14 from a perforated colon. Ragena has one brother and three sisters, all living as of 08/2020. She reports breast cancer in her mother at age 39 and in a paternal aunt. She also notes colon cancer in her maternal grandmother in her early 35's.    GYNECOLOGIC HISTORY:  Patient's last menstrual period was 01/27/2009. Menarche: 59 years old GX P 0 LMP 01/2009 Contraceptive: used for years and stopped in the early 2000's. HRT never used  Hysterectomy? no BSO? 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 She is single. She lives by herself, with her cat.  She is not a counsellor.    ADVANCED DIRECTIVES: not in place; believes she has named her brother Noreen Mackintosh, who lives in [place] and can be reached at [phone]. Her sister Doyal seconds.   HEALTH MAINTENANCE: Social History   Tobacco Use   Smoking status: Never   Smokeless tobacco: Never  Vaping Use   Vaping status: Never Used  Substance Use Topics   Alcohol use: Yes    Alcohol/week: 0.0 standard drinks of alcohol    Comment: 1-2 drinks a month   Drug use: No     Colonoscopy: 05/2018 (Dr. Celestia), normal, recall 2024 for family history  PAP: 06/2020, negative  Bone density: 03/2015, +0.5   Allergies  Allergen Reactions   Mobic [Meloxicam] Itching and Rash    Current Outpatient Medications  Medication Sig Dispense Refill   acetaminophen  (TYLENOL ) 650 MG CR tablet Take 1,300 mg by mouth every 8 (eight) hours as needed for pain.     albuterol  (VENTOLIN  HFA) 108 (90 Base) MCG/ACT inhaler Inhale 2 puffs into the lungs every 6 (six) hours as needed for wheezing or shortness of breath. 8 g 2   ALPRAZolam  (XANAX ) 0.5 MG tablet TAKE 1/2 -1 TABLET BY MOUTH 3 TIMES DAILY AS NEEDED FOR ANXIETY 20 tablet 0   amLODipine  (NORVASC ) 5 MG tablet Take 1.5 tablets (7.5 mg total) by mouth daily. 135 tablet 1   anastrozole  (ARIMIDEX ) 1 MG tablet Take 1 tablet by mouth daily. 90 tablet 2   atorvastatin  (LIPITOR) 80 MG tablet TAKE 1 TABLET BY MOUTH DAILY 90 tablet 0   AUVELITY 45-105 MG TBCR Take 1 tablet by mouth 2 (two) times daily.     CALCIUM  PO Take 1,000 mg by mouth daily.     cetirizine (ZYRTEC) 10 MG tablet Take 10 mg by mouth daily.     cholecalciferol (VITAMIN D3) 25 MCG (1000 UNIT) tablet Take 1,000 Units by mouth daily.     diclofenac  (VOLTAREN ) 75 MG EC tablet TAKE 1 TABLET BY MOUTH 2 TIMES A DAY WITH FOOD FOR MILD OR MODERATE PAIN 60 tablet 2   Esketamine HCl, 56 MG Dose, (SPRAVATO, 56 MG DOSE,) 28 MG/DEVICE SOPK Place into the nose.     fluticasone  (FLONASE ) 50 MCG/ACT nasal spray INSTILL 1  TO 2 SPRAYS INTO EACH NOSTRIL ONCE A DAY (Patient not taking: Reported on 05/11/2024) 16 g 11   Fluticasone  Furoate (ARNUITY ELLIPTA ) 100 MCG/ACT AEPB Inhale 1 puff into the lungs daily. 30 each 12   gabapentin  (NEURONTIN ) 300 MG capsule TAKE 1 CAPSULE BY MOUTH 2 TIMES A DAY 180  capsule 0   hydrochlorothiazide  (MICROZIDE ) 12.5 MG capsule TAKE 1 CAPSULE BY MOUTH DAILY 90 capsule 1   L-Methylfolate-Algae (DEPLIN 15 PO) Take 1 capsule by mouth daily.     losartan  (COZAAR ) 50 MG tablet TAKE 1 TABLET BY MOUTH DAILY 90 tablet 0   Multiple Vitamins-Minerals (WOMENS 50+ MULTI VITAMIN PO) Take 1 tablet by mouth daily.     Omega-3 Fatty Acids (FISH OIL) 1000 MG CAPS Take 2,000 mg by mouth in the morning and at bedtime.     omeprazole (PRILOSEC) 20 MG capsule Take 20 mg by mouth daily.     ondansetron  (ZOFRAN -ODT) 4 MG disintegrating tablet Take 1 tablet (4 mg total) by mouth 2 (two) times daily as needed for nausea or vomiting. 10 tablet 0   OZEMPIC, 1 MG/DOSE, 4 MG/3ML SOPN Inject 1 mg into the skin once a week.     polyethylene glycol (MIRALAX ) 17 g packet Take 17 g by mouth daily. to prevent constipation 14 each 0   triamcinolone  (NASACORT  ALLERGY 24HR) 55 MCG/ACT AERO nasal inhaler Place 2 sprays into the nose daily.     vitamin B-12 (CYANOCOBALAMIN) 1000 MCG tablet Take 1,000 mcg by mouth daily.     vitamin C (ASCORBIC ACID) 500 MG tablet Take 500 mg by mouth daily.     No current facility-administered medications for this visit.    OBJECTIVE: White woman who appears stated age  Vitals:   07/13/24 0856  BP: 120/63  Pulse: 84  Resp: 16  Temp: 97.8 F (36.6 C)  SpO2: 99%      Body mass index is 31.76 kg/m.   Wt Readings from Last 3 Encounters:  07/13/24 185 lb (83.9 kg)  05/11/24 187 lb 12.8 oz (85.2 kg)  05/04/24 187 lb (84.8 kg)      ECOG FS:1 - Symptomatic but completely ambulatory   Physical Exam Constitutional:      Appearance: Normal appearance.  Chest:       Comments:  Bilateral breasts inspected.  No palpable masses or regional adenopathy. Right breast periareolar scar noted.  Musculoskeletal:     Cervical back: Normal range of motion and neck supple. No rigidity.  Skin:    General: Skin is warm and dry.  Neurological:     Mental Status: She is alert.        LAB RESULTS:  CMP     Component Value Date/Time   NA 143 05/11/2024 1048   K 3.8 05/11/2024 1048   CL 102 05/11/2024 1048   CO2 26 05/11/2024 1048   GLUCOSE 85 05/11/2024 1048   GLUCOSE 99 03/06/2022 0918   BUN 21 05/11/2024 1048   CREATININE 0.86 05/11/2024 1048   CREATININE 1.04 (H) 09/11/2020 0830   CREATININE 0.97 03/09/2017 1110   CALCIUM  9.9 05/11/2024 1048   PROT 7.0 05/11/2024 1048   ALBUMIN 4.5 05/11/2024 1048   AST 47 (H) 05/11/2024 1048   AST 49 (H) 09/11/2020 0830   ALT 73 (H) 05/11/2024 1048   ALT 58 (H) 09/11/2020 0830   ALKPHOS 79 05/11/2024 1048   BILITOT 0.3 05/11/2024 1048   BILITOT 0.4 09/11/2020 0830   GFRNONAA >60 03/06/2022 0918   GFRNONAA >60 09/11/2020 0830   GFRAA 77 06/20/2020 1002    No results found for: TOTALPROTELP, ALBUMINELP, A1GS, A2GS, BETS, BETA2SER, GAMS, MSPIKE, SPEI  Lab Results  Component Value Date   WBC 6.2 05/11/2024   NEUTROABS 4.2 05/11/2024   HGB 12.8 05/11/2024   HCT 39.2 05/11/2024  MCV 97 05/11/2024   PLT 208 05/11/2024    No results found for: LABCA2  No components found for: [id number]  No results for input(s): INR in the last 168 hours.  No results found for: LABCA2  No results found for: RJW800  No results found for: CAN125  No results found for: CAN153  No results found for: CA2729  No components found for: HGQUANT  No results found for: CEA1, CEA / No results found for: CEA1, CEA   No results found for: AFPTUMOR  No results found for: CHROMOGRNA  No results found for: KPAFRELGTCHN, LAMBDASER, KAPLAMBRATIO (kappa/lambda light chains)  No  results found for: HGBA, HGBA2QUANT, HGBFQUANT, HGBSQUAN (Hemoglobinopathy evaluation)   No results found for: LDH  Lab Results  Component Value Date   IRON 100 05/11/2024   TIBC 356 05/11/2024   IRONPCTSAT 28 05/11/2024   (Iron and TIBC)  Lab Results  Component Value Date   FERRITIN 28 05/11/2024    Urinalysis    Component Value Date/Time   LABSPEC 1.005 05/19/2023 1104   BILIRUBINUR negative 05/19/2023 1104   BILIRUBINUR neg 03/12/2016 0854   KETONESUR negative 05/19/2023 1104   PROTEINUR negative 05/19/2023 1104   PROTEINUR neg 03/12/2016 0854   UROBILINOGEN negative 03/12/2016 0854   NITRITE Negative 05/19/2023 1104   NITRITE neg 03/12/2016 0854   LEUKOCYTESUR Trace (A) 05/19/2023 1104    STUDIES: US  ELASTOGRAPHY LIVER Result Date: 06/14/2024 EXAM: LIMITED RIGHT UPPER QUADRANT ABDOMINAL ULTRASOUND WITH ELASTOGRAPHY TECHNIQUE: Real-time sonography of the right upper quadrant of the abdomen was performed. Liver stiffness measurements were obtained on a 6c transducer. 10 valid measurements were obtained of segment 8 using a 2D shear wave elastography method. COMPARISON: None available. CLINICAL HISTORY: STEATOHEPATITIS FINDINGS: LIVER: The liver demonstrates normal echogenicity. No evidence of intrahepatic biliary ductal dilatation. No intrahepatic masses. Hepatopetal flow identified within the main portal vein. Median stiffness: 2.8 kPa IQR: median ratio (IQR/M, results unreliable if >0.30): 0.3 BILIARY SYSTEM: No pericholecystic fluid or wall thickening. No cholelithiasis. Negative sonographic Murphy's sign. Extrahepatic bile duct is not visualized on this examination. RIGHT KIDNEY: The right kidney is unremarkable in appearances without evidence of hydronephrosis, echogenic calculi or worrisome mass lesions. PANCREAS: Visualized portions of the pancreas are unremarkable. OTHER: No right upper quadrant ascites. ELASTOGRAPHY: Ten shear wave measurements were obtained  with acceptable variability between measurements. They demonstrate the following findings: Median stiffness: 2.8 kPa IQR: median ratio (IQR/M, results unreliable if >0.30): 0.3 REFERENCE VALUES: (Shear wave propagation speed in target tissue)/ Elasticity in Kilopascal SRU No to mild fibrosis: <1.37 m/s (5.7 kPa) Moderate to severe fibrosis: 1.37 m/s - 2.2 m/s Significant fibrosis/cirrhosis: >2.2 m/s (15kPa) Metavir F=>2 1.34 m/s (5.7 kPa) F=>3 1.55 m/s (7.3 kPa) F=4 1.8 m/s (10 kPa) IMPRESSION: 1. Liver stiffness of 2.8 kPa, correlating with a Metavir score of F0. Electronically signed by: Dorethia Molt MD 06/14/2024 09:40 PM EST RP Workstation: [id number]     ELIGIBLE FOR AVAILABLE RESEARCH PROTOCOL:no  ASSESSMENT: 59 y.o. [place] woman status post right breast upper outer quadrant biopsy 09/02/2020 for a clinical T1b N0, stage IA invasive ductal carcinoma, grade 2, estrogen and progesterone receptor positive, HER-2 not amplified, with an MIB-1 of 30%  (1) status post right lumpectomy and sentinel lymph node sampling 10/03/2020 for a pT1c pN0, stage IA invasive ductal carcinoma, grade 2, with negative margins  (a) a single right axillary lymph node was removed  (2) Oncotype score of 25 predicted a risk of recurrence outside the breast  in the next 9 years of 12% if the patient's only systemic therapy is antiestrogens for 5 years.  It predicts a less than 1% chemotherapy benefit  (3) adjuvant radiation to be completed 12/06/2020  (4) to start anastrozole  12/31/2020  (a) DEXA scan 04/22/2015  (b) DEXA scan January 2023, normal  PLAN: Assessment & Plan Estrogen receptor positive right breast cancer, upper-outer quadrant She is more than halfway through a five-year course of adjuvant anastrozole  with good tolerance and no recurrence. - No concerns on breast exam. - Continue anastrozole  until July 2027, reassess duration then. - Annual surveillance mammogram in October 2026. - Routine  oncology follow-up; next visit in 2027.  Menopausal symptoms Persistent hot flashes, mainly on her back, due to menopause and aromatase inhibitor therapy. E Tolerable.  Bone health - Encouraged weight-bearing exercise, vitamin D , and calcium  supplementation.  Type 2 diabetes mellitus in remission Diabetes in remission due to significant weight loss and semaglutide therapy. - Continue Ozempic as prescribed. - Follow up with diabetes care team next week for ongoing management.   Total time spent: 30 minutes  *Total Encounter Time as defined by the Centers for Medicare and Medicaid Services includes, in addition to the face-to-face time of a patient visit (documented in the note above) non-face-to-face time: obtaining and reviewing outside history, ordering and reviewing medications, tests or procedures, care coordination (communications with other health care professionals or caregivers) and documentation in the medical record. "

## 2024-07-16 ENCOUNTER — Other Ambulatory Visit: Payer: Self-pay | Admitting: Family Medicine

## 2024-07-16 DIAGNOSIS — I10 Essential (primary) hypertension: Secondary | ICD-10-CM

## 2024-07-30 ENCOUNTER — Other Ambulatory Visit: Payer: Self-pay | Admitting: Family Medicine

## 2024-07-30 DIAGNOSIS — E782 Mixed hyperlipidemia: Secondary | ICD-10-CM

## 2024-08-02 LAB — OPHTHALMOLOGY REPORT-SCANNED

## 2024-09-04 ENCOUNTER — Ambulatory Visit

## 2024-11-09 ENCOUNTER — Ambulatory Visit: Admitting: Family Medicine

## 2025-04-23 ENCOUNTER — Ambulatory Visit

## 2025-07-13 ENCOUNTER — Inpatient Hospital Stay: Admitting: Hematology and Oncology
